# Patient Record
Sex: Male | Born: 1955
Health system: Southern US, Community
[De-identification: ages and names within clinical notes are randomized; demographics above are authoritative.]

## PROBLEM LIST (undated history)

## (undated) DIAGNOSIS — R350 Frequency of micturition: Secondary | ICD-10-CM

## (undated) DIAGNOSIS — M549 Dorsalgia, unspecified: Secondary | ICD-10-CM

## (undated) DIAGNOSIS — S42009A Fracture of unspecified part of unspecified clavicle, initial encounter for closed fracture: Secondary | ICD-10-CM

## (undated) DIAGNOSIS — I2510 Atherosclerotic heart disease of native coronary artery without angina pectoris: Secondary | ICD-10-CM

## (undated) DIAGNOSIS — E782 Mixed hyperlipidemia: Secondary | ICD-10-CM

## (undated) DIAGNOSIS — G8929 Other chronic pain: Secondary | ICD-10-CM

## (undated) DIAGNOSIS — G4730 Sleep apnea, unspecified: Secondary | ICD-10-CM

## (undated) DIAGNOSIS — N529 Male erectile dysfunction, unspecified: Secondary | ICD-10-CM

## (undated) DIAGNOSIS — I10 Essential (primary) hypertension: Secondary | ICD-10-CM

## (undated) DIAGNOSIS — M1990 Unspecified osteoarthritis, unspecified site: Secondary | ICD-10-CM

## (undated) DIAGNOSIS — E538 Deficiency of other specified B group vitamins: Secondary | ICD-10-CM

## (undated) DIAGNOSIS — K219 Gastro-esophageal reflux disease without esophagitis: Secondary | ICD-10-CM

## (undated) DIAGNOSIS — N400 Enlarged prostate without lower urinary tract symptoms: Secondary | ICD-10-CM

## (undated) DIAGNOSIS — I219 Acute myocardial infarction, unspecified: Secondary | ICD-10-CM

## (undated) DIAGNOSIS — I739 Peripheral vascular disease, unspecified: Secondary | ICD-10-CM

## (undated) DIAGNOSIS — I255 Ischemic cardiomyopathy: Secondary | ICD-10-CM

## (undated) DIAGNOSIS — I639 Cerebral infarction, unspecified: Secondary | ICD-10-CM

## (undated) HISTORY — DX: Gastro-esophageal reflux disease without esophagitis: K21.9

## (undated) HISTORY — DX: Benign prostatic hyperplasia without lower urinary tract symptoms: N40.0

## (undated) HISTORY — DX: Deficiency of other specified B group vitamins: E53.8

## (undated) HISTORY — PX: HAND SURGERY: SHX662

## (undated) HISTORY — PX: KNEE ARTHROSCOPY: SUR90

## (undated) HISTORY — DX: Ischemic cardiomyopathy: I25.5

## (undated) HISTORY — DX: Male erectile dysfunction, unspecified: N52.9

## (undated) HISTORY — DX: Atherosclerotic heart disease of native coronary artery without angina pectoris: I25.10

## (undated) HISTORY — DX: Mixed hyperlipidemia: E78.2

## (undated) HISTORY — PX: COLONOSCOPY: SHX174

## (undated) HISTORY — DX: Cerebral infarction, unspecified: I63.9

## (undated) HISTORY — DX: Fracture of unspecified part of unspecified clavicle, initial encounter for closed fracture: S42.009A

## (undated) HISTORY — PX: CARDIAC CATHETERIZATION: SHX172

## (undated) HISTORY — PX: FOOT SURGERY: SHX648

---

## 2001-05-22 ENCOUNTER — Inpatient Hospital Stay (HOSPITAL_COMMUNITY): Admission: EM | Admit: 2001-05-22 | Discharge: 2001-05-26 | Payer: Self-pay | Admitting: Emergency Medicine

## 2001-05-22 HISTORY — PX: CORONARY ANGIOPLASTY WITH STENT PLACEMENT: SHX49

## 2001-06-27 ENCOUNTER — Ambulatory Visit (HOSPITAL_COMMUNITY): Admission: RE | Admit: 2001-06-27 | Discharge: 2001-06-28 | Payer: Self-pay | Admitting: *Deleted

## 2001-08-30 ENCOUNTER — Inpatient Hospital Stay (HOSPITAL_COMMUNITY): Admission: EM | Admit: 2001-08-30 | Discharge: 2001-08-31 | Payer: Self-pay | Admitting: Emergency Medicine

## 2003-07-19 ENCOUNTER — Emergency Department (HOSPITAL_COMMUNITY): Admission: EM | Admit: 2003-07-19 | Discharge: 2003-07-19 | Payer: Self-pay | Admitting: Emergency Medicine

## 2003-08-18 ENCOUNTER — Ambulatory Visit (HOSPITAL_BASED_OUTPATIENT_CLINIC_OR_DEPARTMENT_OTHER): Admission: RE | Admit: 2003-08-18 | Discharge: 2003-08-18 | Payer: Self-pay | Admitting: Pulmonary Disease

## 2003-10-05 ENCOUNTER — Ambulatory Visit (HOSPITAL_BASED_OUTPATIENT_CLINIC_OR_DEPARTMENT_OTHER): Admission: RE | Admit: 2003-10-05 | Discharge: 2003-10-05 | Payer: Self-pay | Admitting: Pulmonary Disease

## 2005-10-25 ENCOUNTER — Ambulatory Visit: Payer: Self-pay | Admitting: Cardiovascular Disease

## 2006-10-25 ENCOUNTER — Ambulatory Visit: Payer: Self-pay | Admitting: Cardiovascular Disease

## 2006-11-11 ENCOUNTER — Ambulatory Visit: Payer: Self-pay | Admitting: Cardiovascular Disease

## 2006-11-11 ENCOUNTER — Inpatient Hospital Stay (HOSPITAL_COMMUNITY): Admission: EM | Admit: 2006-11-11 | Discharge: 2006-11-16 | Payer: Self-pay | Admitting: Emergency Medicine

## 2006-11-11 HISTORY — PX: CORONARY ANGIOPLASTY WITH STENT PLACEMENT: SHX49

## 2006-11-12 ENCOUNTER — Encounter: Payer: Self-pay | Admitting: Cardiology

## 2006-11-15 HISTORY — PX: CORONARY ANGIOPLASTY WITH STENT PLACEMENT: SHX49

## 2006-12-09 ENCOUNTER — Ambulatory Visit: Payer: Self-pay | Admitting: Cardiovascular Disease

## 2006-12-09 LAB — CONVERTED CEMR LAB
BUN: 14 mg/dL (ref 6–23)
Basophils Absolute: 0.1 10*3/uL (ref 0.0–0.1)
Basophils Relative: 1 % (ref 0.0–1.0)
CO2: 28 meq/L (ref 19–32)
Calcium: 9.6 mg/dL (ref 8.4–10.5)
Chloride: 107 meq/L (ref 96–112)
Creatinine, Ser: 0.7 mg/dL (ref 0.4–1.5)
Eosinophils Absolute: 1.1 10*3/uL — ABNORMAL HIGH (ref 0.0–0.6)
Eosinophils Relative: 10.3 % — ABNORMAL HIGH (ref 0.0–5.0)
GFR calc Af Amer: 154 mL/min
GFR calc non Af Amer: 127 mL/min
Glucose, Bld: 92 mg/dL (ref 70–99)
HCT: 43.8 % (ref 39.0–52.0)
Hemoglobin: 15.3 g/dL (ref 13.0–17.0)
INR: 1.1 (ref 0.9–2.0)
Lymphocytes Relative: 30.5 % (ref 12.0–46.0)
MCHC: 34.9 g/dL (ref 30.0–36.0)
MCV: 88.2 fL (ref 78.0–100.0)
Monocytes Absolute: 1.1 10*3/uL — ABNORMAL HIGH (ref 0.2–0.7)
Monocytes Relative: 10.4 % (ref 3.0–11.0)
Neutro Abs: 4.8 10*3/uL (ref 1.4–7.7)
Neutrophils Relative %: 47.8 % (ref 43.0–77.0)
Platelets: 220 10*3/uL (ref 150–400)
Potassium: 3.8 meq/L (ref 3.5–5.1)
Prothrombin Time: 12.9 s (ref 10.0–14.0)
RBC: 4.97 M/uL (ref 4.22–5.81)
RDW: 11.9 % (ref 11.5–14.6)
Sodium: 139 meq/L (ref 135–145)
WBC: 10.2 10*3/uL (ref 4.5–10.5)
aPTT: 36.3 s (ref 26.5–36.5)

## 2006-12-12 ENCOUNTER — Ambulatory Visit: Payer: Self-pay | Admitting: Cardiovascular Disease

## 2006-12-13 ENCOUNTER — Inpatient Hospital Stay (HOSPITAL_BASED_OUTPATIENT_CLINIC_OR_DEPARTMENT_OTHER): Admission: RE | Admit: 2006-12-13 | Discharge: 2006-12-13 | Payer: Self-pay | Admitting: Cardiovascular Disease

## 2006-12-16 ENCOUNTER — Ambulatory Visit (HOSPITAL_COMMUNITY): Admission: RE | Admit: 2006-12-16 | Discharge: 2006-12-16 | Payer: Self-pay | Admitting: Cardiovascular Disease

## 2006-12-19 ENCOUNTER — Ambulatory Visit: Payer: Self-pay | Admitting: Cardiovascular Disease

## 2006-12-25 ENCOUNTER — Ambulatory Visit: Payer: Self-pay | Admitting: Cardiovascular Disease

## 2007-01-17 ENCOUNTER — Ambulatory Visit: Payer: Self-pay | Admitting: Cardiovascular Disease

## 2008-02-09 ENCOUNTER — Ambulatory Visit (HOSPITAL_COMMUNITY): Admission: RE | Admit: 2008-02-09 | Discharge: 2008-02-09 | Payer: Self-pay | Admitting: Orthopedic Surgery

## 2008-05-26 ENCOUNTER — Ambulatory Visit (HOSPITAL_COMMUNITY): Admission: RE | Admit: 2008-05-26 | Discharge: 2008-05-26 | Payer: Self-pay | Admitting: Orthopedic Surgery

## 2008-10-11 ENCOUNTER — Ambulatory Visit: Payer: Self-pay | Admitting: Cardiovascular Disease

## 2008-10-18 ENCOUNTER — Ambulatory Visit: Payer: Self-pay

## 2010-02-16 ENCOUNTER — Ambulatory Visit: Payer: Self-pay | Admitting: Cardiovascular Disease

## 2010-02-16 ENCOUNTER — Encounter (INDEPENDENT_AMBULATORY_CARE_PROVIDER_SITE_OTHER): Payer: Self-pay | Admitting: *Deleted

## 2010-02-16 DIAGNOSIS — E782 Mixed hyperlipidemia: Secondary | ICD-10-CM | POA: Insufficient documentation

## 2010-02-16 DIAGNOSIS — F17200 Nicotine dependence, unspecified, uncomplicated: Secondary | ICD-10-CM | POA: Insufficient documentation

## 2010-02-16 DIAGNOSIS — R079 Chest pain, unspecified: Secondary | ICD-10-CM | POA: Insufficient documentation

## 2010-02-16 DIAGNOSIS — I10 Essential (primary) hypertension: Secondary | ICD-10-CM | POA: Insufficient documentation

## 2010-02-16 DIAGNOSIS — E785 Hyperlipidemia, unspecified: Secondary | ICD-10-CM | POA: Insufficient documentation

## 2010-02-20 ENCOUNTER — Ambulatory Visit: Payer: Self-pay | Admitting: Cardiovascular Disease

## 2010-02-20 ENCOUNTER — Ambulatory Visit (HOSPITAL_COMMUNITY): Admission: RE | Admit: 2010-02-20 | Discharge: 2010-02-20 | Payer: Self-pay | Admitting: Cardiovascular Disease

## 2010-02-21 ENCOUNTER — Telehealth: Payer: Self-pay | Admitting: Cardiovascular Disease

## 2010-02-21 ENCOUNTER — Telehealth (INDEPENDENT_AMBULATORY_CARE_PROVIDER_SITE_OTHER): Payer: Self-pay | Admitting: *Deleted

## 2010-02-22 ENCOUNTER — Encounter: Payer: Self-pay | Admitting: Cardiovascular Disease

## 2010-02-22 ENCOUNTER — Encounter (INDEPENDENT_AMBULATORY_CARE_PROVIDER_SITE_OTHER): Payer: Self-pay | Admitting: *Deleted

## 2010-02-22 ENCOUNTER — Ambulatory Visit: Payer: Self-pay

## 2010-02-22 ENCOUNTER — Ambulatory Visit: Payer: Self-pay | Admitting: Cardiology

## 2010-02-22 ENCOUNTER — Encounter (HOSPITAL_COMMUNITY): Admission: RE | Admit: 2010-02-22 | Discharge: 2010-05-09 | Payer: Self-pay | Admitting: Cardiovascular Disease

## 2010-02-28 ENCOUNTER — Encounter (INDEPENDENT_AMBULATORY_CARE_PROVIDER_SITE_OTHER): Payer: Self-pay | Admitting: *Deleted

## 2010-02-28 ENCOUNTER — Ambulatory Visit: Payer: Self-pay | Admitting: Cardiovascular Disease

## 2010-03-13 ENCOUNTER — Ambulatory Visit: Payer: Self-pay | Admitting: Cardiovascular Disease

## 2010-03-13 DIAGNOSIS — R0602 Shortness of breath: Secondary | ICD-10-CM | POA: Insufficient documentation

## 2010-03-13 DIAGNOSIS — I2510 Atherosclerotic heart disease of native coronary artery without angina pectoris: Secondary | ICD-10-CM | POA: Insufficient documentation

## 2010-03-15 LAB — CONVERTED CEMR LAB
BUN: 16 mg/dL (ref 6–23)
Basophils Absolute: 0.2 10*3/uL — ABNORMAL HIGH (ref 0.0–0.1)
Basophils Relative: 2 % (ref 0.0–3.0)
CO2: 27 meq/L (ref 19–32)
Calcium: 9.1 mg/dL (ref 8.4–10.5)
Chloride: 108 meq/L (ref 96–112)
Creatinine, Ser: 0.6 mg/dL (ref 0.4–1.5)
Eosinophils Absolute: 0.7 10*3/uL (ref 0.0–0.7)
Eosinophils Relative: 7.6 % — ABNORMAL HIGH (ref 0.0–5.0)
GFR calc non Af Amer: 146.38 mL/min (ref >60)
Glucose, Bld: 100 mg/dL — ABNORMAL HIGH (ref 70–99)
HCT: 40.1 % (ref 39.0–52.0)
Hemoglobin: 13.8 g/dL (ref 13.0–17.0)
INR: 1.1 — ABNORMAL HIGH (ref 0.8–1.0)
Lymphocytes Relative: 32.6 % (ref 12.0–46.0)
Lymphs Abs: 3 10*3/uL (ref 0.7–4.0)
MCHC: 34.5 g/dL (ref 30.0–36.0)
MCV: 89.9 fL (ref 78.0–100.0)
Monocytes Absolute: 0.6 10*3/uL (ref 0.1–1.0)
Monocytes Relative: 6.3 % (ref 3.0–12.0)
Neutro Abs: 4.7 10*3/uL (ref 1.4–7.7)
Neutrophils Relative %: 51.5 % (ref 43.0–77.0)
Platelets: 200 10*3/uL (ref 150.0–400.0)
Potassium: 3.7 meq/L (ref 3.5–5.1)
Prothrombin Time: 11.6 s (ref 9.1–11.7)
RBC: 4.46 M/uL (ref 4.22–5.81)
RDW: 12.5 % (ref 11.5–14.6)
Sodium: 141 meq/L (ref 135–145)
WBC: 9.1 10*3/uL (ref 4.5–10.5)

## 2010-03-16 ENCOUNTER — Ambulatory Visit: Payer: Self-pay | Admitting: Cardiovascular Disease

## 2010-03-16 ENCOUNTER — Observation Stay (HOSPITAL_COMMUNITY): Admission: RE | Admit: 2010-03-16 | Discharge: 2010-03-17 | Payer: Self-pay | Admitting: Cardiovascular Disease

## 2010-03-16 HISTORY — PX: CORONARY ANGIOPLASTY WITH STENT PLACEMENT: SHX49

## 2010-04-17 ENCOUNTER — Telehealth: Payer: Self-pay | Admitting: Cardiovascular Disease

## 2010-05-23 ENCOUNTER — Ambulatory Visit: Payer: Self-pay | Admitting: Cardiovascular Disease

## 2010-06-20 ENCOUNTER — Ambulatory Visit: Payer: Self-pay | Admitting: Cardiology

## 2010-06-20 ENCOUNTER — Emergency Department (HOSPITAL_COMMUNITY): Admission: EM | Admit: 2010-06-20 | Discharge: 2010-06-20 | Payer: Self-pay | Admitting: Emergency Medicine

## 2010-06-20 ENCOUNTER — Encounter: Payer: Self-pay | Admitting: Cardiovascular Disease

## 2010-06-21 ENCOUNTER — Telehealth: Payer: Self-pay | Admitting: Cardiovascular Disease

## 2010-06-22 ENCOUNTER — Telehealth: Payer: Self-pay | Admitting: Cardiovascular Disease

## 2010-06-24 ENCOUNTER — Emergency Department (HOSPITAL_COMMUNITY): Admission: EM | Admit: 2010-06-24 | Discharge: 2010-06-24 | Payer: Self-pay | Admitting: Emergency Medicine

## 2010-06-26 ENCOUNTER — Ambulatory Visit: Payer: Self-pay | Admitting: Cardiovascular Disease

## 2010-06-26 ENCOUNTER — Encounter: Payer: Self-pay | Admitting: Cardiology

## 2010-06-26 DIAGNOSIS — S42009A Fracture of unspecified part of unspecified clavicle, initial encounter for closed fracture: Secondary | ICD-10-CM | POA: Insufficient documentation

## 2010-06-27 ENCOUNTER — Ambulatory Visit (HOSPITAL_COMMUNITY): Admission: RE | Admit: 2010-06-27 | Discharge: 2010-06-27 | Payer: Self-pay | Admitting: Cardiovascular Disease

## 2010-07-12 ENCOUNTER — Ambulatory Visit: Payer: Self-pay | Admitting: Gastroenterology

## 2010-07-12 DIAGNOSIS — I2510 Atherosclerotic heart disease of native coronary artery without angina pectoris: Secondary | ICD-10-CM | POA: Insufficient documentation

## 2010-07-12 DIAGNOSIS — K921 Melena: Secondary | ICD-10-CM | POA: Insufficient documentation

## 2010-09-04 ENCOUNTER — Telehealth: Payer: Self-pay | Admitting: Gastroenterology

## 2010-09-15 ENCOUNTER — Ambulatory Visit: Payer: Self-pay | Admitting: Cardiovascular Disease

## 2010-10-16 ENCOUNTER — Ambulatory Visit: Payer: Self-pay | Admitting: Gastroenterology

## 2010-12-03 LAB — CONVERTED CEMR LAB
BUN: 12 mg/dL (ref 6–23)
Basophils Absolute: 0.2 10*3/uL — ABNORMAL HIGH (ref 0.0–0.1)
Basophils Relative: 2.1 % (ref 0.0–3.0)
CO2: 26 meq/L (ref 19–32)
Calcium: 9 mg/dL (ref 8.4–10.5)
Chloride: 105 meq/L (ref 96–112)
Creatinine, Ser: 0.7 mg/dL (ref 0.4–1.5)
Eosinophils Absolute: 0.9 10*3/uL — ABNORMAL HIGH (ref 0.0–0.7)
Eosinophils Relative: 8.8 % — ABNORMAL HIGH (ref 0.0–5.0)
GFR calc non Af Amer: 124.92 mL/min (ref >60)
Glucose, Bld: 93 mg/dL (ref 70–99)
HCT: 42.4 % (ref 39.0–52.0)
Hemoglobin: 14.7 g/dL (ref 13.0–17.0)
INR: 1.1 — ABNORMAL HIGH (ref 0.8–1.0)
Lymphocytes Relative: 32.5 % (ref 12.0–46.0)
Lymphs Abs: 3.2 10*3/uL (ref 0.7–4.0)
MCHC: 34.7 g/dL (ref 30.0–36.0)
MCV: 89 fL (ref 78.0–100.0)
Monocytes Absolute: 0.8 10*3/uL (ref 0.1–1.0)
Monocytes Relative: 8.7 % (ref 3.0–12.0)
Neutro Abs: 4.7 10*3/uL (ref 1.4–7.7)
Neutrophils Relative %: 47.9 % (ref 43.0–77.0)
Platelets: 177 10*3/uL (ref 150.0–400.0)
Potassium: 3.8 meq/L (ref 3.5–5.1)
Prothrombin Time: 11.6 s (ref 9.1–11.7)
RBC: 4.76 M/uL (ref 4.22–5.81)
RDW: 12.9 % (ref 11.5–14.6)
Sodium: 138 meq/L (ref 135–145)
WBC: 9.7 10*3/uL (ref 4.5–10.5)

## 2010-12-05 NOTE — Progress Notes (Signed)
Summary: Nuclear Pre-Procedure  Phone Note Outgoing Call Call back at Work Phone [phone]   Call placed by: Stanton Kidney, EMT-P,  February 21, 2010 1:56 PM Action Taken: Phone Call Completed Summary of Call: Reviewed information on [employer] Information Sheet (see scanned document for further details).  Spoke with Patient.    Nuclear Med Background Indications for Stress Test: Evaluation for Ischemia, Stent Patency   History: Abnormal EKG, Echo, Heart Catheterization, Myocardial Infarction, Myocardial Perfusion Study, Stents  History Comments: '02 IWMI w/ V. Fib > RCA Stent '08 AWMI > Stents: LAD, RCA '08 Echo: EF= 45% '09 MPS: EF=43%   Symptoms: Chest Pain with Exertion    Nuclear Pre-Procedure Cardiac Risk Factors: Hypertension, Lipids, Smoker Height (in): 68

## 2010-12-05 NOTE — Assessment & Plan Note (Signed)
Summary: f2y/ gd  Medications Added CARVEDILOL 12.5 MG TABS (CARVEDILOL) Take one tablet by mouth twice a day      Allergies Added: NKDA  CC:  sob.  History of Present Illness: [name] returns today for followup.  He has had multiple previous interventions.  He had drug-eluting stents placed to his RCA and LAD back in 2002.  In 2008, he had an anterior wall MI and subsequently had bare-metal stents to the LAD and RCA.  Unfortunately, continues to smoke.  I counseled him for less than 10 minutes today regarding this,he hopes to quit this month again.  He just had a new grandson and it has him thinking again.  [name]  He does not need any Chantix or Wellbutrin.  He has quit [place] in the past. He has been having exertiona "uneasyness" that sounds anginal. It has been building over the last 6 months and is now fairly reproducable.  He has not had any rest pain.  There is no associated diaphoresis or dyspnea.  Given his previous history and abnormal ECG in the office today I told him I would like him to have a heart cath.  We will arrange this for Monday with Dr Excell Seltzer from the right radial approach since I am not in the lab next week.  If he has restenosis he may be a better candidate for Effient long term.  Current Problems (verified): 1)  Chest Pain  (ICD-786.50)  Current Medications (verified): 1)  Carvedilol 12.5 Mg Tabs (Carvedilol) .... Take One Tablet By Mouth Twice A Day 2)  Pravachol 80 Mg Tabs (Pravastatin Sodium) .[name].. 1 Tab By Mouth Once Daily 3)  Plavix 75 Mg Tabs (Clopidogrel Bisulfate) .... Take One Tablet By Mouth Daily 4)  Altace 5 Mg Caps (Ramipril) .[name].. 1 Tab By Mouth Once Daily  Allergies (verified): No Known Drug Allergies  Review of Systems       Denies fever, malais, weight loss, blurry vision, decreased visual acuity, cough, sputum, SOB, hemoptysis, pleuritic pain, palpitaitons, heartburn, abdominal pain, melena, lower extremity edema, claudication, or rash. All other  systems reviewed and negative except as noted in HPI  Vital Signs:  Patient profile:   55 year old male Height:      68 inches Weight:      168 pounds BMI:     25.64 Pulse rate:   68 / minute Resp:     14 per minute BP sitting:   142 / 80  (left arm)  Vitals Entered By: [name] (February 16, 2010 4:08 PM)  Physical Exam  General:  Affect appropriate Healthy:  appears stated age HEENT: normal Neck supple with no adenopathy JVP normal no bruits no thyromegaly Lungs clear with no wheezing and good diaphragmatic motion Heart:  S1/S2 no murmur,rub, gallop or click PMI normal Abdomen: benighn, BS positve, no tenderness, no AAA no bruit.  No HSM or HJR Distal pulses intact with no bruits No edema Neuro non-focal Skin warm and dry    Impression & Recommendations:  Problem # 1:  CHEST PAIN (ICD-786.50) History of multiple interventions to LAD and RCA with continued smoking Chest pressure conscerning for angina with ECG changes Cath inpatient lab with [facility] Monday  Right Radial His updated medication list for this problem includes:    Carvedilol 12.5 Mg Tabs (Carvedilol) .[name]... Take one tablet by mouth twice a day    Plavix 75 Mg Tabs (Clopidogrel bisulfate) .[name]... Take one tablet by mouth daily    Altace  5 Mg Caps (Ramipril) .[name]... 1 tab by mouth once daily  Orders: TLB-BMP (Basic Metabolic Panel-BMET) (80048-METABOL) TLB-CBC Platelet - w/Differential (85025-CBCD) TLB-PT (Protime) (85610-PTP) T-2 View CXR (71020TC) Cardiac Catheterization (Cardiac Cath)  Problem # 2:  ESSENTIAL HYPERTENSION, BENIGN (ICD-401.1) Well controlled His updated medication list for this problem includes:    Carvedilol 12.5 Mg Tabs (Carvedilol) .[name]... Take one tablet by mouth twice a day    Altace 5 Mg Caps (Ramipril) .[name]... 1 tab by mouth once daily  Problem # 3:  MIXED HYPERLIPIDEMIA (ICD-272.2) Will switch to high dose Crestor post cath to decrease risk of LFT abnormalities and improve  LDL His updated medication list for this problem includes:    Pravachol 80 Mg Tabs (Pravastatin sodium) .[name]... 1 tab by mouth once daily  Problem # 4:  SMOKER (ICD-305.1) I think he has personal reasons to quit again.  Post cath will offer medication choice to help.  Not a candidate for nicotine replacement at this time  Patient Instructions: 1)  Your physician recommends that you schedule a follow-up appointment in: 4 weeks 2)  Your physician has requested that you have a cardiac catheterization.  Cardiac catheterization is used to diagnose and/or treat various heart conditions. Doctors may recommend this procedure for a number of different reasons. The most common reason is to evaluate chest pain. Chest pain can be a symptom of coronary artery disease (CAD), and cardiac catheterization can show whether plaque is narrowing or blocking your heart's arteries. This procedure is also used to evaluate the valves, as well as measure the blood flow and oxygen levels in different parts of your heart.  For further information please visit [email or web address]  Please follow instruction sheet, as given.   Echocardiogram Report  Procedure date:  02/16/2010  Findings:      NSR IMI Anteroseptal infarct T wave changes ? lateral ishcemia

## 2010-12-05 NOTE — Letter (Signed)
Summary: [facility] Instructions  [facility]  [address] [place], [place] [address]   Phone: [phone]  Fax: [phone]       [name]    10/29/1956    MRN: [id number]        Procedure Day /Date: Friday November 4th, 2011     Arrival Time: 3:00pm     Procedure Time: 4:00pm     Location of Procedure:                    _ x_   [facility] (4th Floor)                        PREPARATION FOR COLONOSCOPY WITH MOVIPREP   Starting 5 days prior to your procedure 09/04/10 do not eat nuts, seeds, popcorn, corn, beans, peas,  salads, or any raw vegetables.  Do not take any fiber supplements (e.g. Metamucil, Citrucel, and Benefiber).  THE DAY BEFORE YOUR PROCEDURE         DATE: 09/07/10  DAY: Thursday  1.  Drink clear liquids the entire day-NO SOLID FOOD  2.  Do not drink anything colored red or purple.  Avoid juices with pulp.  No orange juice.  3.  Drink at least 64 oz. (8 glasses) of fluid/clear liquids during the day to prevent dehydration and help the prep work efficiently.  CLEAR LIQUIDS INCLUDE: Water Jello Ice Popsicles Tea (sugar ok, no milk/cream) Powdered fruit flavored drinks Coffee (sugar ok, no milk/cream) Gatorade Juice: apple, white grape, white cranberry  Lemonade Clear bullion, consomm, broth Carbonated beverages (any kind) Strained chicken noodle soup Hard Candy                             4.  In the morning, mix first dose of MoviPrep solution:    Empty 1 Pouch A and 1 Pouch B into the disposable container    Add lukewarm drinking water to the top line of the container. Mix to dissolve    Refrigerate (mixed solution should be used within 24 hrs)  5.  Begin drinking the prep at 5:00 p.m. The MoviPrep container is divided by 4 marks.   Every 15 minutes drink the solution down to the next mark (approximately 8 oz) until the full liter is complete.   6.  Follow completed prep with 16 oz of clear liquid of your  choice (Nothing red or purple).  Continue to drink clear liquids until bedtime.  7.  Before going to bed, mix second dose of MoviPrep solution:    Empty 1 Pouch A and 1 Pouch B into the disposable container    Add lukewarm drinking water to the top line of the container. Mix to dissolve    Refrigerate  THE DAY OF YOUR PROCEDURE      DATE: 09/08/10 DAY: Friday  Beginning at 11:00 a.m. (5 hours before procedure):         1. Every 15 minutes, drink the solution down to the next mark (approx 8 oz) until the full liter is complete.  2. Follow completed prep with 16 oz. of clear liquid of your choice.    3. You may drink clear liquids until 2:00pm (2 HOURS BEFORE PROCEDURE).   MEDICATION INSTRUCTIONS  Unless otherwise instructed, you should take regular prescription medications with a small sip of water   as early as possible the morning of your  procedure.  Stop taking Plavix or Aggrenox on  _  _  (7 days before procedure).     Additional medication instructions: You will be contacted by our office prior to your procedure for directions on holding your Plavix.  If you do not hear from our office 1 week prior to your scheduled procedure, please call [phone] to discuss.         OTHER INSTRUCTIONS  You will need a responsible adult at least 54 years of age to accompany you and drive you home.   This person must remain in the waiting room during your procedure.  Wear loose fitting clothing that is easily removed.  Leave jewelry and other valuables at home.  However, you may wish to bring a book to read or  an iPod/MP3 player to listen to music as you wait for your procedure to start.  Remove all body piercing jewelry and leave at home.  Total time from sign-in until discharge is approximately 2-3 hours.  You should go home directly after your procedure and rest.  You can resume normal activities the  day after your procedure.  The day of your procedure you should not:    Drive   Make legal decisions   Operate machinery   Drink alcohol   Return to work  You will receive specific instructions about eating, activities and medications before you leave.    The above instructions have been reviewed and explained to me by   _______________________    I fully understand and can verbalize these instructions _____________________________ Date _________

## 2010-12-05 NOTE — Letter (Signed)
Summary: Return To Work  [facility], Main Office  [address]   [place], [place] [address]   Phone: [phone]  Fax: [phone]    06/26/2010  TO: WHOM IT MAY CONCERN   RE: [name] [address] [place]   The above named individual is under my medical care and may return to work on:June 27, 2010 No Restriction  If you have any further questions or need additional information, please call.     Sincerely,   Dr. Charlton Haws Lisabeth Devoid RN

## 2010-12-05 NOTE — Miscellaneous (Signed)
  Clinical Lists Changes  Medications: Added new medication of CARVEDILOL 12.5 MG TABS (CARVEDILOL) Take one tablet by mouth twice a day Added new medication of PRAVACHOL 80 MG TABS (PRAVASTATIN SODIUM) 1 tab by mouth once daily Added new medication of PLAVIX 75 MG TABS (CLOPIDOGREL BISULFATE) Take one tablet by mouth daily Added new medication of ALTACE 5 MG CAPS (RAMIPRIL) 1 tab by mouth once daily

## 2010-12-05 NOTE — Assessment & Plan Note (Signed)
Summary: SCREEN FOR COLON ON PLAVIX/YF   History of Present Illness Visit Type: consult Primary GI MD: Elie Goody MD [facility] Primary Provider: Paulene Floor, FNP Requesting Provider: Charlton Haws, MD Chief Complaint: BRB in stool after BMs and when patient wipes after BMs  History of Present Illness:   This is a 55 year old male, who relates intermittent, small amounts of bright red blood per rectum with bowel movements for the past year.  He has underlying coronary artery disease and is status post multiple stent placements, most recently he had a drug-eluting stent placed in May 2011. He is maintained on Plavix and ASA.  He recently suffered a right clavicle fracture and is recovering with limited mobility.   GI Review of Systems      Denies abdominal pain, acid reflux, belching, bloating, chest pain, dysphagia with liquids, dysphagia with solids, heartburn, loss of appetite, nausea, vomiting, vomiting blood, weight loss, and  weight gain.      Reports rectal bleeding.     Denies anal fissure, black tarry stools, change in bowel habit, constipation, diarrhea, diverticulosis, fecal incontinence, heme positive stool, hemorrhoids, irritable bowel syndrome, jaundice, light color stool, liver problems, and  rectal pain.   Current Medications (verified): 1)  Carvedilol 12.5 Mg Tabs (Carvedilol) .... Take One Tablet By Mouth Twice A Day 2)  Pravachol 80 Mg Tabs (Pravastatin Sodium) .[name].. 1 Tab By Mouth Once Daily 3)  Plavix 75 Mg Tabs (Clopidogrel Bisulfate) .... Take One Tablet By Mouth Daily 4)  Altace 5 Mg Caps (Ramipril) .[name].. 1 Tab By Mouth Once Daily 5)  Aspirin Ec 325 Mg Tbec (Aspirin) .... Take One Tablet By Mouth Daily 6)  Calcium 1000 + D 1000-800 Mg-Unit Tabs (Calcium Carb-Cholecalciferol) .... One Tablet By Mouth Once Daily 7)  Rolaids 550-110 Mg Chew (Ca Carbonate-Mag Hydroxide) .... As Needed For Heartburn  Allergies (verified): No Known Drug Allergies  Past  History:  Past Medical History: IMI: 05/22/2001 AMI:  11/23/2007 Smoking DYSPNEA (ICD-786.05) CORONARY ATHEROSCLEROSIS NATIVE CORONARY ARTERY (ICD-414.01) MIXED HYPERLIPIDEMIA (ICD-272.2) ESSENTIAL HYPERTENSION, BENIGN (ICD-401.1) CHEST PAIN (ICD-786.50) GERD  Past Surgical History: Reviewed history from 07/11/2010 and no changes required. 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         The pertinent positives and negatives are noted as above and in the HPI. All other ROS were reviewed and were negative.   Vital Signs:  Patient profile:   55 year old male Height:      68 inches Weight:      160 pounds BMI:     24.42 BSA:     1.86 Pulse rate:   80 / minute Pulse rhythm:   regular BP sitting:   124 / 76  (left arm) Cuff size:   regular  Vitals Entered By: Ok Anis CMA (July 12, 2010 3:05 PM)  Physical Exam  General:  Well developed, well nourished, no acute distress. Head:  Normocephalic and atraumatic. Eyes:  PERRLA, no icterus. Ears:  Normal auditory acuity. Mouth:  No  deformity or lesions, dentition normal. Neck:  Supple; no masses or thyromegaly. Lungs:  Clear throughout to auscultation. Heart:  Regular rate and rhythm; no murmurs, rubs,  or bruits. Abdomen:  Soft, nontender and nondistended. No masses, hepatosplenomegaly or hernias noted. Normal bowel sounds. Rectal:  hemoccult positive brown stool, no lesions. Msk:  Symmetrical with no gross deformities. Normal posture. Pulses:  Normal pulses  noted. Extremities:  No clubbing, cyanosis, edema or deformities noted. Neurologic:  Alert and  oriented x4;  grossly normal neurologically. Cervical Nodes:  No significant cervical adenopathy. Inguinal Nodes:  No significant inguinal adenopathy. Psych:  Alert and cooperative. Normal mood and affect.   Impression & Recommendations:  Problem # 1:  BLOOD IN STOOL (ICD-578.1) Intermittent small-volume hematochezia and Hemoccult-positive stool. Rule out hemorrhoids, colorectal neoplasms, inflammatory bowel disease, AVMs, and other disorders. The risks, benefits and alternatives to colonoscopy with possible biopsy, possible destruction of hemorrhoids and possible polypectomy were discussed with the patient and they consent to proceed. The procedure will be scheduled electively. Orders: Colonoscopy (Colon)  Problem # 2:  CORONARY ATHEROSCLEROSIS NATIVE CORONARY ARTERY (ICD-414.01) He has underlying coronary artery disease with prior cornary artery stents and a drug-eluting stent placed in May. Plan to proceed with colonoscopy while on Plavix and aspirin. He understands that he will be at increased risk of bleeding with biopsies, removal of polyps, treatment of hemorrhoids or other therapeutic interventions. If a larger polyp is noted at colonoscopy we may defer removal. Contact Dr. [name] regarding a 5 day hold of Plavix, however, I feel it is unlikely the pt will be cleared to come off Plavix for 5 days with his recently placed drug-eluting stent.  Patient Instructions: 1)  Colonoscopy brochure given.  2)  Pick up your prep from your pharmacy.  3)  Copy sent to : Paulene Floor, FNP 4)                         Charlton Haws, MD 5)  The medication list was reviewed and reconciled.  All changed / newly prescribed medications were explained.  A complete medication list was provided to the patient / caregiver.  Prescriptions: MOVIPREP 100 GM  SOLR (PEG-KCL-NACL-NASULF-NA ASC-C) As per prep instructions.   #1 x 0   Entered by:   Christie Nottingham CMA (AAMA)   Authorized by:   Meryl Dare MD [facility]   Signed by:   Meryl Dare MD [facility] on 07/12/2010   Method used:   Electronically to        [facility] [phone]* (retail)       [address]       [place], [place]  [address]       Ph: [phone] or [phone]       Fax: [phone]   RxID:   [id number]

## 2010-12-05 NOTE — Cardiovascular Report (Signed)
Summary: Pre Cath/Percutaneous Orders  Pre Cath/Percutaneous Orders   Imported By: Roderic Ovens 03/08/2010 15:16:44  _____________________________________________________________________  External Attachment:    Type:   Image     Comment:   External Document

## 2010-12-05 NOTE — Letter (Signed)
Summary: Anticoagulation Modification Letter  [facility]  [address] [place], [place] [address]   Phone: [phone]  Fax: [phone]    July 12, 2010  Re:    [name] DOB:    15-May-1956 MRN:    [id number]    Dear Dr. [name],  We have scheduled the above patient for an endoscopic procedure. Our records show that  he/she is on anticoagulation therapy. Please advise as to how long the patient may come off their therapy of Plavix prior to the scheduled procedure(s) on 09/08/10. We are aware the patient has had a stent placed recently and we are not sure if they can even come off this medication prior to the scheduled procedure.   Please fax back/or route the completed form to [facility] at [phone].  Thank you for your help with this matter.  Sincerely,  Christie Nottingham CMA Duncan Dull)   Physician Recommendation:  Hold Plavix 7 days prior ________________  Hold Coumadin 5 days prior ____________  Other ______________________________     Appended Document: Anticoagulation Modification Letter Recent stent. Should not stop Plavix until november unless active bleeding or suspicion of CA  Appended Document: Anticoagulation Modification Letter Pt notified to stay on Plavix for his procedure.

## 2010-12-05 NOTE — Assessment & Plan Note (Signed)
Summary: PER CHECK OUT/SF      Allergies Added: NKDA  Visit Type:  Follow-up Referring Provider:  Charlton Haws, MD Primary Provider:  Paulene Floor, FNP   History of Present Illness: F/U CAD with chronically occluded RCA and recent stenting of LAD by Dr Excell Seltzer at end of May.  CRF well modified.  Compliant with Plavix. P2Y testing showed 72% inhibition. and PRU only 76 which is excellent inhibition.   Has been off cigarettes since about a week before procedure.  Counseled for less than 10 minutes.  Difficult due to family and co-workers that smoke.  Discussed nicotine replacement if needed.  Also told him to call if he goes back to smoking so we could consider welbutrin and change him to Effient.  Not having any angina.   Still has some BRBPR colonoscopy rescheduled.  Since he is asymptomatic and will be past the 6 months mark with good PLT inhibition I think it should be ok to proceed with colonoscopy 12/12 and hold Plavix for 5 days .  Current Problems (verified): 1)  Blood in Stool  (ICD-578.1) 2)  Cad  (ICD-414.00) 3)  Fracture, Clavicle, Right  (ICD-810.00) 4)  Dyspnea  (ICD-786.05) 5)  Coronary Atherosclerosis Native Coronary Artery  (ICD-414.01) 6)  Mixed Hyperlipidemia  (ICD-272.2) 7)  Smoker  (ICD-305.1) 8)  Essential Hypertension, Benign  (ICD-401.1) 9)  Chest Pain  (ICD-786.50)  Current Medications (verified): 1)  Carvedilol 12.5 Mg Tabs (Carvedilol) .... Take One Tablet By Mouth Twice A Day 2)  Pravachol 80 Mg Tabs (Pravastatin Sodium) .[name].. 1 Tab By Mouth Once Daily 3)  Plavix 75 Mg Tabs (Clopidogrel Bisulfate) .... Take One Tablet By Mouth Daily 4)  Altace 5 Mg Caps (Ramipril) .[name].. 1 Tab By Mouth Once Daily 5)  Aspirin Ec 325 Mg Tbec (Aspirin) .... Take One Tablet By Mouth Daily  Allergies (verified): No Known Drug Allergies  Past History:  Past Medical History: Last updated: 07/12/2010 IMI: 05/22/2001 AMI:  11/23/2007 Smoking DYSPNEA (ICD-786.05) CORONARY  ATHEROSCLEROSIS NATIVE CORONARY ARTERY (ICD-414.01) MIXED HYPERLIPIDEMIA (ICD-272.2) ESSENTIAL HYPERTENSION, BENIGN (ICD-401.1) CHEST PAIN (ICD-786.50) GERD  Past Surgical History: Last updated: 07/11/2010 05/22/2001:  PCI RCA and LAD 11/11/2006:  PCI BMS LAD 11/15/2006  PCI BMS RCA Knee surgery  Family History: Last updated: 07/12/2010 non-contributory Family History of Heart Disease: Father, MGM, MGF, PGM, and PGF No FH of Colon Cancer:  Social History: Last updated: 07/12/2010 Occupation: [employer]  Married Two children 1 gransond Use to Engineer, 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   Vital Signs:  Patient profile:   55 year old male Height:      68 inches Weight:      166 pounds BMI:     25.33 Pulse rate:   72 / minute BP sitting:   134 / 80  (left arm)  Vitals Entered By: Laurance Flatten CMA (September 15, 2010 2:48 PM)  Physical Exam  General:  Affect appropriate Healthy:  appears stated age HEENT: normal Neck supple with no adenopathy JVP normal no bruits no thyromegaly Lungs clear with no wheezing and good diaphragmatic motion Heart:  S1/S2 no murmur,rub, gallop or click PMI normal Abdomen: benighn, BS positve, no tenderness, no  AAA no bruit.  No HSM or HJR Distal pulses intact with no bruits No edema Neuro non-focal Skin warm and dry    Impression & Recommendations:  Problem # 1:  CAD (ICD-414.00) Stable no angina.  Continue ASA/Plavix and BB.  Since he is asymtomatic and will be out past 6 months will let him have colonoscopy in December and hold Plavix for 5 days His updated medication list for this problem includes:    Carvedilol  12.5 Mg Tabs (Carvedilol) .[name]... Take one tablet by mouth twice a day    Plavix 75 Mg Tabs (Clopidogrel bisulfate) .[name]... Take one tablet by mouth daily    Altace 5 Mg Caps (Ramipril) .[name]... 1 tab by mouth once daily    Aspirin Ec 325 Mg Tbec (Aspirin) .[name]... Take one tablet by mouth daily  Problem # 2:  MIXED HYPERLIPIDEMIA (ICD-272.2) Continue statin.  Labs per primary His updated medication list for this problem includes:    Pravachol 80 Mg Tabs (Pravastatin sodium) .[name]... 1 tab by mouth once daily  Problem # 3:  ESSENTIAL HYPERTENSION, BENIGN (ICD-401.1) Continue ACE and low sodium diet His updated medication list for this problem includes:    Carvedilol 12.5 Mg Tabs (Carvedilol) .[name]... Take one tablet by mouth twice a day    Altace 5 Mg Caps (Ramipril) .[name]... 1 tab by mouth once daily    Aspirin Ec 325 Mg Tbec (Aspirin) .[name]... Take one tablet by mouth daily  Patient Instructions: 1)  Your physician recommends that you schedule a follow-up appointment in: 6  MONTHS WITH DR [name] 2)  Your physician recommends that you continue on your current medications as directed. Please refer to the Current Medication list given to you today.

## 2010-12-05 NOTE — Cardiovascular Report (Signed)
Summary: Pre Cath/Percutaneous Orders  Pre Cath/Percutaneous Orders   Imported By: Roderic Ovens 02/22/2010 10:37:16  _____________________________________________________________________  External Attachment:    Type:   Image     Comment:   External Document

## 2010-12-05 NOTE — Assessment & Plan Note (Signed)
Summary: ROV/DISCUSS CATH/DM   CC:  Review Myoview Stress Test; ROV.  History of Present Illness: [name] is seen in f/u for his CAD.  I reviewed his myovue and cath films with him.  I am very concerned about his proximal LAD lesion.  His ETT looked better than expected with an apicla and inferobasa infarct but no quantitative ischemia.  He continues to have angina however with an "uneasy" feeling in his chest with exertion or stress.  Given the severity of his proximal LAD lesion I think it is best to refer him for PCI with Dr Excell Seltzer 03/16/2010.  He is in agreement.  He will have lab work on 5/9 or earlier that week.  He will continue his ASA and Plavix.  He knows to go to the ER with any prolonged SSCP.  He is working on smoking cessation and has cut his consumption in half  Current Problems (verified): 1)  Mixed Hyperlipidemia  (ICD-272.2) 2)  Smoker  (ICD-305.1) 3)  Essential Hypertension, Benign  (ICD-401.1) 4)  Chest Pain  (ICD-786.50)  Current Medications (verified): 1)  Carvedilol 12.5 Mg Tabs (Carvedilol) .... Take One Tablet By Mouth Twice A Day 2)  Pravachol 80 Mg Tabs (Pravastatin Sodium) .[name].. 1 Tab By Mouth Once Daily 3)  Plavix 75 Mg Tabs (Clopidogrel Bisulfate) .... Take One Tablet By Mouth Daily 4)  Altace 5 Mg Caps (Ramipril) .[name].. 1 Tab By Mouth Once Daily  Allergies (verified): No Known Drug Allergies  Past History:  Past Medical History: Last updated: 10/11/2008 IMI: 05/22/2001 AMI:  11/23/2007 Hypercholesterolemia Smoking  Past Surgical History: Last updated: 10/11/2008 05/22/2001:  PCI RCA and LAD 11/11/2006:  PCI BMS LAD 11/15/2006  PCI BMS RCA  Family History: Last updated: 10/11/2008 non-contributory  Social History: Last updated: 10/11/2008 Married Two children 1 granson Use to Engineer, maintenance (nurse Lauren previous player) Still smoking 1 ppd  Review of Systems       Denies fever, malais, weight loss, blurry vision, decreased visual acuity, cough,  sputum, SOB, hemoptysis, pleuritic pain, palpitaitons, heartburn, abdominal pain, melena, lower extremity edema, claudication, or rash.   Vital Signs:  Patient profile:   55 year old male Height:      68 inches Weight:      166 pounds Pulse rate:   72 / minute Pulse rhythm:   regular BP sitting:   148 / 82  (left arm) Cuff size:   regular  Vitals Entered By: Stanton Kidney, EMT-P (February 28, 2010 10:28 AM)  Physical Exam  General:  Affect appropriate Healthy:  appears stated age HEENT: normal Neck supple with no adenopathy JVP normal no bruits no thyromegaly Lungs clear with no wheezing and good diaphragmatic motion Heart:  S1/S2 no murmur,rub, gallop or click PMI normal Abdomen: benighn, BS positve, no tenderness, no AAA no bruit.  No HSM or HJR Distal pulses intact with no bruits No edema Neuro non-focal Skin warm and dry    Impression & Recommendations:  Problem # 1:  MIXED HYPERLIPIDEMIA (ICD-272.2) Continue high dose statin His updated medication list for this problem includes:    Pravachol 80 Mg Tabs (Pravastatin sodium) .[name]... 1 tab by mouth once daily  Problem # 2:  CHEST PAIN (ICD-786.50) CAD with previous MI and stents to LAD and RCA.  70-80% proximal LAD lesion.  PCI with Excell Seltzer 5/12 His updated medication list for this problem includes:    Carvedilol 12.5 Mg Tabs (Carvedilol) .[name]... Take one tablet by mouth twice a day  Plavix 75 Mg Tabs (Clopidogrel bisulfate) .[name]... Take one tablet by mouth daily    Altace 5 Mg Caps (Ramipril) .[name]... 1 tab by mouth once daily  Orders: Cardiac Catheterization (Cardiac Cath)  Patient Instructions: 1)  Your physician recommends that you schedule a follow-up appointment in: 6 WEEKS 2)  Your physician has requested that you have a cardiac catheterization.  Cardiac catheterization is used to diagnose and/or treat various heart conditions. Doctors may recommend this procedure for a number of different reasons. The most common  reason is to evaluate chest pain. Chest pain can be a symptom of coronary artery disease (CAD), and cardiac catheterization can show whether plaque is narrowing or blocking your heart's arteries. This procedure is also used to evaluate the valves, as well as measure the blood flow and oxygen levels in different parts of your heart.  For further information please visit [email or web address]  Please follow instruction sheet, as given.

## 2010-12-05 NOTE — Letter (Signed)
Summary: [facility] - Outpatient Coins[facility] Notice  [facility]   Imported By: Marylou Mccoy 02/23/2010 11:55:44  _____________________________________________________________________  External Attachment:    Type:   Image     Comment:   External Document

## 2010-12-05 NOTE — Progress Notes (Signed)
Summary: previsit letter ?'s   Phone Note Call from Patient Call back at Home Phone [phone]   Caller: Patient Call For: Dr. Russella Dar Reason for Call: Talk to Nurse Summary of Call: previsit letter ?'s Initial call taken by: Vallarie Mare,  September 04, 2010 9:16 AM  Follow-up for Phone Call        Pt wanted to reschedule his procedure for 10/10/10. Pt rescheduled and verbalized understanding of scheduling times.  Follow-up by: Christie Nottingham CMA Duncan Dull),  September 04, 2010 11:44 AM

## 2010-12-05 NOTE — Letter (Signed)
Summary: Return To Work  [facility], Main Office  [address]   [place], [place] [address]   Phone: [phone]  Fax: [phone]    02/22/2010  TO: WHOM IT MAY CONCERN   RE: [name] [address] [place]   The above named individual is under my medical care and may return to work [id number] 02-27-10  If you have any further questions or need additional information, please call.     Sincerely,    [name], RN

## 2010-12-05 NOTE — Progress Notes (Signed)
Summary: refill*[facility]* Please call pt once sent   Phone Note Refill Request Call back at Work Phone [phone]   Refills Requested: Medication #1:  CARVEDILOL 12.5 MG TABS Take one tablet by mouth twice a day   Supply Requested: 3 months  Medication #2:  PLAVIX 75 MG TABS Take one tablet by mouth daily   Supply Requested: 3 months  Medication #3:  PRAVACHOL 80 MG TABS 1 tab by mouth once daily   Supply Requested: 3 months  Medication #4:  ALTACE 5 MG CAPS 1 tab by mouth once daily.   Supply Requested: 3 months [facility] [phone]   Method Requested: Fax to Mail Away Pharmacy Initial call taken by: Migdalia Dk,  April 17, 2010 8:11 AM  Follow-up for Phone Call        Rx faxed to pharmacy Follow-up by: Vikki Ports,  April 17, 2010 12:24 PM    Prescriptions: ALTACE 5 MG CAPS (RAMIPRIL) 1 tab by mouth once daily  #90 x 3   Entered by:   Vikki Ports   Authorized by:   Norva Karvonen, MD   Signed by:   Vikki Ports on 04/17/2010   Method used:   Faxed to ...       [facility] (mail-order)             , [place]         Ph:        Fax: [phone]   RxID:   [id number] PLAVIX 75 MG TABS (CLOPIDOGREL BISULFATE) Take one tablet by mouth daily  #90 x 3   Entered by:   Vikki Ports   Authorized by:   Norva Karvonen, MD   Signed by:   Vikki Ports on 04/17/2010   Method used:   Faxed to ...       [facility] (mail-order)             , [place]         Ph:        Fax: [phone]   RxID:   [id number] PRAVACHOL 80 MG TABS (PRAVASTATIN SODIUM) 1 tab by mouth once daily  #90 x 3   Entered by:   Vikki Ports   Authorized by:   Norva Karvonen, MD   Signed by:   Vikki Ports on 04/17/2010   Method used:   Faxed to ...       [facility] (mail-order)             , [place]         Ph:        Fax: [phone]   RxID:   [id number] CARVEDILOL 12.5 MG TABS (CARVEDILOL) Take one tablet by mouth twice a day  #180 x 3   Entered by:   Vikki Ports   Authorized by:   Norva Karvonen, MD   Signed by:   Vikki Ports on 04/17/2010   Method used:   Faxed to ...       [facility] (mail-order)             , [place]         Ph:        Fax: [phone]   RxID:   [id number]

## 2010-12-05 NOTE — Assessment & Plan Note (Signed)
Summary: Cardiology Nuclear Study  Nuclear Med Background Indications for Stress Test: Evaluation for Ischemia, Stent Patency   History: Abnormal EKG, Echo, Heart Catheterization, Myocardial Infarction, Myocardial Perfusion Study, Stents  History Comments: '02 IWMI w/ V. Fib > RCA Stent '08 AWMI > Stents: LAD, RCA '08 Echo: EF= 45% '09 MPS: EF=43%   Symptoms: Chest Pain with Exertion, Dizziness, DOE, Light-Headedness, SOB    Nuclear Pre-Procedure Cardiac Risk Factors: History of Smoking, Hypertension, Lipids Caffeine/Decaff Intake: none NPO After: 7:00 PM Lungs: clear IV 0.9% NS with Angio Cath: 20g     IV Site: R (AC) IV Started by: Irean Hong RN Chest Size (in) 40     Height (in): 68 Weight (lb): 160 BMI: 24.42  Nuclear Med Study 1 or 2 day study:  1 day     Stress Test Type:  Stress Reading MD:  Olga Millers, MD     Referring MD:  P.Nishan Resting Radionuclide:  Technetium 16m Tetrofosmin     Resting Radionuclide Dose:  11 mCi  Stress Radionuclide:  Technetium 47m Tetrofosmin     Stress Radionuclide Dose:  33 mCi   Stress Protocol Exercise Time (min):  8:31 min     Max HR:  111 bpm     Predicted Max HR:  166 bpm  Max Systolic BP: 165 mm Hg     Percent Max HR:  66.87 %     METS: 10.10 Rate Pressure Product:  16109    Stress Test Technologist:  Milana Na EMT-P     Nuclear Technologist:  Domenic Polite CNMT  Rest Procedure  Myocardial perfusion imaging was performed at rest 45 minutes following the intravenous administration of Myoview Technetium 42m Tetrofosmin.  Stress Procedure  The patient exercised for 8:31. The patient stopped due to fatigue and denied any chest pain.  There were no significant ST-T wave changes.  Myoview was injected at peak exercise and myocardial perfusion imaging was performed after a brief delay.  QPS Raw Data Images:  There is no interference from other nuclear activity. Stress Images:  There is decreased uptake in the  septum, apex and inferior walls. Rest Images:  There is decreased uptake in the septum, apex and inferior walls. Subtraction (SDS):  There is a fixed defect that is most consistent with a previous infarction. Transient Ischemic Dilatation:  .99  (Normal <1.22)  Lung/Heart Ratio:  .31  (Normal <0.45)  Quantitative Gated Spect Images QGS EDV:  152 ml QGS ESV:  90 ml QGS EF:  41 % QGS cine images:  Apical hypokinesis; evidence of left ventricular enlargement.   Overall Impression  Exercise Capacity: Fair exercise capacity. BP Response: Normal blood pressure response. Clinical Symptoms: No chest pain ECG Impression: No significant ST segment change suggestive of ischemia. Overall Impression: Abnormal stress nuclear study with prior septal, apical and inferior infarct; no ischemia.  Appended Document: Cardiology Nuclear Study pt aware of results

## 2010-12-05 NOTE — Consult Note (Signed)
Summary: [facility]   Imported By: Roderic Ovens 07/07/2010 15:54:50  _____________________________________________________________________  External Attachment:    Type:   Image     Comment:   External Document

## 2010-12-05 NOTE — Progress Notes (Signed)
Summary: Pt request call   Phone Note Call from Patient Call back at Work Phone [phone]   Summary of Call: Pt request call Initial call taken by: Judie Grieve,  June 21, 2010 9:25 AM  Follow-up for Phone Call        spoke with pt, he was seen in the ER yesterday. he developed a tightness in his chest and his work called 911. he states he was very hot and doing a pretty strenous job. he states he had taken NTG before EMS got there and his tightness was relieved on the way to the hosp. he has no problems since yesterday. he is unable to return to work until seen. dr [name] back in the office on monday. pt requested a note, will discuss with dr Riley Kill who saw the pt in the ER. [name], RN  June 21, 2010 10:16 AM  pt requesting another call re rpoblem he had yesterday-pls call [phone] [name]  June 21, 2010 2:32 PM   Additional Follow-up for Phone Call Additional follow up Details #1::        spoke with pt, he will get a note on monday when seen by dr Verdis Prime, RN  June 23, 2010 11:18 AM

## 2010-12-05 NOTE — Letter (Signed)
Summary: Cardiac Catheterization Instructions- Main Lab  [facility], Main Office  [address]   [place], [place] [address]   Phone: [phone]  Fax: [phone]     02/16/2010 MRN: [id number]  MANCIL PFENNING [address] [place], [place]  [address]  Dear Mr. Speyer,   You are scheduled for Cardiac Catheterization on [facility] 02-20-10              with Dr. Excell Seltzer           .  Please arrive at the [facility] at  10:30     a.m. on the day of your procedure.  1. DIET     _XXXXXXX___ Nothing to eat or drink after midnight except your medications with a sip of water.  2. Come to the [place] office on             for lab work.  The lab at [facility] is open from 8:30 a.m. to 1:30 p.m. and 2:30 p.m. to 5:00 p.m.  The lab at [address] [facility] is open from 7:30 a.m. to 5:30 p.m.  You do not have to be fasting.  3. MAKE SURE YOU TAKE YOUR ASPIRIN.  4. _____ DO NOT TAKE these medications before your procedure:         ________________________________________________________________________________      ____ YOU MAY TAKE ALL of your remaining medications with a small amount of water.      ____ START NEW medications:     ________________________________________________________________________________      ____ Eilene Ghazi instructions:     ________________________________________________________________________________  5. Plan for one night stay - bring personal belongings (i.e. toothpaste, toothbrush, etc.)  6. Bring a current list of your medications and current insurance cards.  7. Must have a responsible person to drive you home.   8. Someone must be with yu for the first 24 hours after you arrive home.  9. Please wear clothes that are easy to get on and off and wear slip-on shoes.  *Special note: Every effort is made to have your procedure done on time.  Occasionally there are emergencies that present themselves  at the hospital that may cause delays.  Please be patient if a delay does occur.  If you have any questions after you get home, please call the office at the number listed above.  [name], RN

## 2010-12-05 NOTE — Assessment & Plan Note (Signed)
Summary: rov/chest pain/dm      Allergies Added: NKDA  Visit Type:  Follow-up  CC:  pt states he had an eposide of hand numbness and chest discomfort-He went to ER pt states erythinh checked out ok..  History of Present Illness: F/U CAD with chronically occluded RCA and recent stenting of LAD by Dr Excell Seltzer at end of May.  CRF well modified.  Compliant with Plavix. Will send to [facility] for PY2 testing.  Has been off cigarettes since about a week before procedure.  Counseled for less than 10 minutes.  Difficult due to family and co-workers that smoke.  Discussed nicotine replacement if needed.  Also told him to call if he goes back to smoking so we could consider welbutrin and change him to Effient.  Not having any angina.  Fairly intolerant to recent heat wave  Recent fall with ? fractured clavicle.  Needs ortho F/U.  Explained to him importance of PY2 testing as he has not done it yet.  Current Problems (verified): 1)  Dyspnea  (ICD-786.05) 2)  Coronary Atherosclerosis Native Coronary Artery  (ICD-414.01) 3)  Mixed Hyperlipidemia  (ICD-272.2) 4)  Smoker  (ICD-305.1) 5)  Essential Hypertension, Benign  (ICD-401.1) 6)  Chest Pain  (ICD-786.50)  Current Medications (verified): 1)  Carvedilol 12.5 Mg Tabs (Carvedilol) .... Take One Tablet By Mouth Twice A Day 2)  Pravachol 80 Mg Tabs (Pravastatin Sodium) .[name].. 1 Tab By Mouth Once Daily 3)  Plavix 75 Mg Tabs (Clopidogrel Bisulfate) .... Take One Tablet By Mouth Daily 4)  Altace 5 Mg Caps (Ramipril) .[name].. 1 Tab By Mouth Once Daily 5)  Aspirin Ec 325 Mg Tbec (Aspirin) .... Take One Tablet By Mouth Daily  Allergies (verified): No Known Drug Allergies  Past History:  Past Medical History: Last updated: 10/11/2008 IMI: 05/22/2001 AMI:  11/23/2007 Hypercholesterolemia Smoking  Past Surgical History: Last updated: 10/11/2008 05/22/2001:  PCI RCA and LAD 11/11/2006:  PCI BMS LAD 11/15/2006  PCI BMS RCA  Family History: Last updated:  10/11/2008 non-contributory  Social History: Last updated: 10/11/2008 Married Two children 1 granson Use to Engineer, maintenance (nurse Lauren previous player) Still smoking 1 ppd  Review of Systems       Denies fever, malais, weight loss, blurry vision, decreased visual acuity, cough, sputum, SOB, hemoptysis, pleuritic pain, palpitaitons, heartburn, abdominal pain, melena, lower extremity edema, claudication, or rash.   Vital Signs:  Patient profile:   55 year old male Height:      68 inches Weight:      162 pounds BMI:     24.72 Pulse rate:   73 / minute BP sitting:   120 / 76  (left arm) Cuff size:   regular  Vitals Entered By: Burnett Kanaris, CNA (June 26, 2010 2:40 PM)  Physical Exam  General:  Affect appropriate Healthy:  appears stated age HEENT: normal Neck supple with no adenopathy JVP normal no bruits no thyromegaly Lungs clear with no wheezing and good diaphragmatic motion Heart:  S1/S2 no murmur,rub, gallop or click PMI normal Abdomen: benighn, BS positve, no tenderness, no AAA no bruit.  No HSM or HJR Distal pulses intact with no bruits No edema Neuro non-focal Skin warm and dry Right arm in sling   Impression & Recommendations:  Problem # 1:  CORONARY ATHEROSCLEROSIS NATIVE CORONARY ARTERY (ICD-414.01) Stable no angina.  Check Py2 and change to Effient if low His updated medication list for this problem includes:    Carvedilol 12.5 Mg Tabs (Carvedilol) .[name]... Take  one tablet by mouth twice a day    Plavix 75 Mg Tabs (Clopidogrel bisulfate) .[name]... Take one tablet by mouth daily    Altace 5 Mg Caps (Ramipril) .[name]... 1 tab by mouth once daily    Aspirin Ec 325 Mg Tbec (Aspirin) .[name]... Take one tablet by mouth daily  Problem # 2:  MIXED HYPERLIPIDEMIA (ICD-272.2) Continue statin labs in 3 months His updated medication list for this problem includes:    Pravachol 80 Mg Tabs (Pravastatin sodium) .[name]... 1 tab by mouth once daily  Problem # 3:  SMOKER  (ICD-305.1) Indicates abstaining still Will start Chantix per primary  Problem # 4:  ESSENTIAL HYPERTENSION, BENIGN (ICD-401.1) Well controlled His updated medication list for this problem includes:    Carvedilol 12.5 Mg Tabs (Carvedilol) .[name]... Take one tablet by mouth twice a day    Altace 5 Mg Caps (Ramipril) .[name]... 1 tab by mouth once daily    Aspirin Ec 325 Mg Tbec (Aspirin) .[name]... Take one tablet by mouth daily  Problem # 5:  FRACTURE, CLAVICLE, RIGHT (ICD-810.00) Needs F/U of arm and likely PT/OT if in sling for more than 2 weeks.    Appended Document: rov/chest pain/dm    Clinical Lists Changes  Orders: Added new Test order of T- * Misc. Laboratory test [phone]) - Signed Added new Referral order of Misc. Referral (Misc. Ref) - Signed

## 2010-12-05 NOTE — Miscellaneous (Signed)
  Clinical Lists Changes  Orders: Added new Service order of EKG w/ Interpretation ([id number]) - Signed 

## 2010-12-05 NOTE — Assessment & Plan Note (Signed)
Summary: F6W/POST CATH/DM  Medications Added ASPIRIN EC 325 MG TBEC (ASPIRIN) Take one tablet by mouth daily      Allergies Added: NKDA  CC:  follow up cath.  History of Present Illness: F/U CAD with chronically occluded RCA and recent stenting of LAD by Dr Excell Seltzer at end of May.  CRF well modified.  Compliant with Plavix. Will send to [facility] for PY2 testing.  Has been off cigarettes since about a week before procedure.  Counseled for less than 10 minutes.  Difficult due to family and co-workers that smoke.  Discussed nicotine replacement if needed.  Also told him to call if he goes back to smoking so we could consider welbutrin and change him to Effient.  Not having any angina.  Fairly intolerant to recent heat wave  Current Problems (verified): 1)  Dyspnea  (ICD-786.05) 2)  Coronary Atherosclerosis Native Coronary Artery  (ICD-414.01) 3)  Mixed Hyperlipidemia  (ICD-272.2) 4)  Smoker  (ICD-305.1) 5)  Essential Hypertension, Benign  (ICD-401.1) 6)  Chest Pain  (ICD-786.50)  Current Medications (verified): 1)  Carvedilol 12.5 Mg Tabs (Carvedilol) .... Take One Tablet By Mouth Twice A Day 2)  Pravachol 80 Mg Tabs (Pravastatin Sodium) .[name].. 1 Tab By Mouth Once Daily 3)  Plavix 75 Mg Tabs (Clopidogrel Bisulfate) .... Take One Tablet By Mouth Daily 4)  Altace 5 Mg Caps (Ramipril) .[name].. 1 Tab By Mouth Once Daily 5)  Aspirin Ec 325 Mg Tbec (Aspirin) .... 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   Vital Signs:  Patient profile:   55 year old male Height:      68 inches Weight:      166 pounds BMI:     25.33 Pulse rate:   73 / minute Resp:     14 per minute BP sitting:   150 / 84  (left arm)  Vitals Entered By: [name] (May 23, 2010 3:48 PM)  Physical Exam  General:  Affect appropriate Healthy:  appears stated age HEENT: normal Neck supple with no adenopathy JVP normal no bruits no thyromegaly Lungs clear with no wheezing and good diaphragmatic motion Heart:  S1/S2 no murmur,rub, gallop or click PMI normal Abdomen: benighn, BS positve, no tenderness, no AAA no bruit.  No HSM or HJR Distal pulses intact with no bruits No edema Neuro non-focal Skin warm and dry    Impression & Recommendations:  Problem # 1:  CORONARY ATHEROSCLEROSIS NATIVE CORONARY ARTERY (ICD-414.01)  Stable recent stent to LAD with chronicallyocclued RCA.  PY2 and assess platlet inhibition His updated medication list for this problem includes:    Carvedilol 12.5 Mg Tabs (Carvedilol) .[name]... Take one tablet by mouth twice a day    Plavix 75 Mg Tabs (Clopidogrel bisulfate) .[name]... Take one tablet by mouth daily    Altace 5 Mg Caps (Ramipril) .Marland KitchenMarland KitchenMarland KitchenMarland Kitchen  1 tab by mouth once daily    Aspirin Ec 325 Mg Tbec (Aspirin) .[name]... Take one tablet by mouth daily  His updated medication list for this problem includes:    Carvedilol 12.5 Mg Tabs (Carvedilol) .[name]... Take one tablet by mouth twice a day    Plavix 75 Mg Tabs (Clopidogrel bisulfate) .[name]... Take one tablet by mouth daily    Altace 5 Mg Caps (Ramipril) .[name]... 1 tab by mouth once daily    Aspirin Ec 325 Mg Tbec (Aspirin) .[name]... Take one tablet by mouth daily  Orders: Gastroenterology Referral  (GI)  Problem # 2:  MIXED HYPERLIPIDEMIA (ICD-272.2)  lab in 6 months no side effects His updated medication list for this problem includes:    Pravachol 80 Mg Tabs (Pravastatin sodium) .[name]... 1 tab by mouth once daily  His updated medication list for this problem includes:    Pravachol 80 Mg Tabs (Pravastatin sodium) .[name]... 1 tab by mouth once daily  Orders: Gastroenterology Referral (GI)  Problem # 3:  SMOKER (ICD-305.1) Continue abstinance.  Chantix black box warning prohibiits use. Will call for welbutrin or alternative program if he slips up  Patient Instructions: 1)  Your physician recommends that you schedule a follow-up appointment in: 6 months with Dr. [name] 2)  Your physician recommends that you continue on your current medications as directed. Please refer to the Current Medication list given to you today. 3)  Your physician recommends that you return for lab work next week at the hospital  : PY2 (platelet test) 4)  You have been referred to gastroenterology for screening colonoscopy with either Dr. Karolee Ohs or Dr. Lina Sar

## 2010-12-05 NOTE — Letter (Signed)
Summary: Cardiac Catheterization Instructions- Main Lab  [facility], Main Office  [address]   [place], [place] [address]   Phone: [phone]  Fax: [phone]     02/28/2010 MRN: [id number]  QUAME SPRATLIN [address] [place], [place]  [address]  Dear Mr. Skluzacek,   You are scheduled for Cardiac Catheterization on THURSDAY 03-16-10              with Dr. Excell Seltzer           .  Please arrive at the [facility] at 5:30      a.m. on the day of your procedure.  1. DIET     _XXXXX___ Nothing to eat or drink after midnight except your medications with a sip of water.  2. Come to the [place] office on [address] for lab work.  The lab at [facility] is open from 8:30 a.m. to 1:30 p.m. and 2:30 p.m. to 5:00 p.m.  The lab at [facility] is open from 7:30 a.m. to 5:30 p.m.  You do not have to be fasting.  3. MAKE SURE YOU TAKE YOUR ASPIRIN.  4. _____ DO NOT TAKE these medications before your procedure:         ________________________________________________________________________________      ____ YOU MAY TAKE ALL of your remaining medications with a small amount of water.      ____ START NEW medications:     ________________________________________________________________________________      ____ Eilene Ghazi instructions:     ________________________________________________________________________________  5. Plan for one night stay - bring personal belongings (i.e. toothpaste, toothbrush, etc.)  6. Bring a current list of your medications and current insurance cards.  7. Must have a responsible person to drive you home.   8. Someone must be with yu for the first 24 hours after you arrive home.  9. Please wear clothes that are easy to get on and off and wear slip-on shoes.  *Special note: Every effort is made to have your procedure done on time.  Occasionally there are emergencies that present themselves  at the hospital that may cause delays.  Please be patient if a delay does occur.  If you have any questions after you get home, please call the office at the number listed above.  [name], RN

## 2010-12-05 NOTE — Progress Notes (Signed)
Summary: rtn to wrok note/talk to nurse re episode   Phone Note Call from Patient   Reason for Call: Talk to Nurse Summary of Call: pt wants a rtn to work note asap-wants to go back tomorrow-fax #[phone] att: brenda hundley- pt also wants a call re an episode he had last week-pls call [phone] Initial call taken by: [name],  June 22, 2010 12:08 PM  Follow-up for Phone Call        Called patient back and advised him that I could not clear him to RTW tomorrow after reading Dr.Stuckey's hospital note from a few days ago. Patient works as a Administrator, [employer] and does perform some physically exertional job functions. I advised him that he needs to keep his appointment with Dr.Tempie Gibeault on Monday and he will decide at that time what his appropriate RTW status will be and will provide a note at that time. Patient verbalized understanding. Follow-up by: Suzan Garibaldi  RN

## 2010-12-05 NOTE — Progress Notes (Signed)
Summary: pt needs to rtn to work   Phone Note Call from Patient Call back at Work Phone [phone]   Caller: Patient Reason for Call: Talk to Nurse, Talk to Doctor Summary of Call: pt has a eph on 5/11 had cath yesterday and per pt he needs to return to work on Monday he cant stay out of work that long. Initial call taken by: Omer Jack,  February 21, 2010 8:23 AM  Follow-up for Phone Call        discussed with dr [name], pt needing exercise myoview. he is clear to return to work monday and the [employer] will be done prior to [employer]. [name], RN  February 21, 2010 8:41 AM

## 2010-12-07 NOTE — Procedures (Signed)
Summary: Colonoscopy  Patient: [name] Note: All result statuses are Final unless otherwise noted.  Tests: (1) Colonoscopy (COL)   COL Colonoscopy           DONE     [facility]     [address]     [place], [place]  [address]           COLONOSCOPY PROCEDURE REPORT     PATIENT:  [name], [name]  MR#:  [id number]     BIRTHDATE:  1956/01/03, 54 yrs. old  GENDER:  male     ENDOSCOPIST:  Judie Petit T. Russella Dar, MD, [facility]           PROCEDURE DATE:  10/16/2010     PROCEDURE:  Colon w/ inj sclerosis of hemorrhoids     ASA CLASS:  Class II     INDICATIONS:  1) hematochezia  2) heme positive stool     MEDICATIONS:   Fentanyl 75 mcg IV, Versed 7 mg IV     DESCRIPTION OF PROCEDURE:   After the risks benefits and     alternatives of the procedure were thoroughly explained, informed     consent was obtained.  Digital rectal exam was performed and     revealed no abnormalities.   The LB [id number] endoscope     was introduced through the anus and advanced to the cecum, which     was identified by both the appendix and ileocecal valve, without     limitations.  The quality of the prep was excellent, using     MoviPrep.  The instrument was then slowly withdrawn as the colon     was fully examined.     <<PROCEDUREIMAGES>>     FINDINGS:  A normal appearing cecum, ileocecal valve, and     appendiceal orifice were identified. The ascending, hepatic     flexure, transverse, splenic flexure, descending, sigmoid colon,     and rectum appeared unremarkable.  Internal hemorrhoids were     found. They were moderately sized. Injection sclerosis of     hemorrhoids with 2 cc of 23.4% saline, injected above the dentate     line. Retroflexed views in the rectum revealed no other findings     other than those already described. The time to cecum =  2     minutes. The scope was then withdrawn (time =  10.5  min) from the     patient and the procedure completed.           COMPLICATIONS:   None           ENDOSCOPIC IMPRESSION:     1) Normal colon     2) Internal hemorrhoids           RECOMMENDATIONS:     1) Continue current colorectal screening for "routine risk"     patients with a repeat colonoscopy in 10 years.           Venita Lick. Russella Dar, MD, [facility]           CC: Wendall Stade, MD           n.     Rosalie DoctorVenita Lick. Brodin Gelpi at 10/16/2010 02:29 PM           Kreg Shropshire, [phone]  Note: An exclamation mark (!) indicates a result that was not dispersed into the flowsheet. Document Creation Date: 10/16/2010 2:29 PM _______________________________________________________________________  (1) Order result status: Final Collection or  observation date-time: 10/16/2010 14:22 Requested date-time:  Receipt date-time:  Reported date-time:  Referring Physician:   Ordering Physician: Claudette Head [phone]) Specimen Source:  Source: Launa Grill Order Number: [id number] Lab site:   Appended Document: Colonoscopy    Clinical Lists Changes  Observations: Added new observation of COLONNXTDUE: 10/2020 (10/16/2010 15:42)

## 2011-01-18 LAB — CK TOTAL AND CKMB (NOT AT ARMC)
CK, MB: 1.2 ng/mL (ref 0.3–4.0)
CK, MB: 1.3 ng/mL (ref 0.3–4.0)
Relative Index: INVALID (ref 0.0–2.5)
Relative Index: INVALID (ref 0.0–2.5)
Total CK: 59 U/L (ref 7–232)
Total CK: 71 U/L (ref 7–232)

## 2011-01-18 LAB — BASIC METABOLIC PANEL
BUN: 8 mg/dL (ref 6–23)
CO2: 25 mEq/L (ref 19–32)
Calcium: 9.3 mg/dL (ref 8.4–10.5)
Chloride: 107 mEq/L (ref 96–112)
Creatinine, Ser: 0.57 mg/dL (ref 0.4–1.5)
GFR calc Af Amer: >60 mL/min (ref >60)
GFR calc non Af Amer: >60 mL/min (ref >60)
Glucose, Bld: 95 mg/dL (ref 70–99)
Potassium: 3.7 mEq/L (ref 3.5–5.1)
Sodium: 138 mEq/L (ref 135–145)

## 2011-01-18 LAB — URINALYSIS, ROUTINE W REFLEX MICROSCOPIC
Bilirubin Urine: NEGATIVE
Glucose, UA: NEGATIVE mg/dL
Hgb urine dipstick: NEGATIVE
Ketones, ur: NEGATIVE mg/dL
Nitrite: NEGATIVE
Protein, ur: NEGATIVE mg/dL
Specific Gravity, Urine: 1.011 (ref 1.005–1.030)
Urobilinogen, UA: 1 mg/dL (ref 0.0–1.0)
pH: 6.5 (ref 5.0–8.0)

## 2011-01-18 LAB — DIFFERENTIAL
Basophils Absolute: 0 10*3/uL (ref 0.0–0.1)
Basophils Relative: 0 % (ref 0–1)
Eosinophils Absolute: 0.3 10*3/uL (ref 0.0–0.7)
Eosinophils Relative: 3 % (ref 0–5)
Lymphocytes Relative: 20 % (ref 12–46)
Lymphs Abs: 2.3 10*3/uL (ref 0.7–4.0)
Monocytes Absolute: 0.8 10*3/uL (ref 0.1–1.0)
Monocytes Relative: 7 % (ref 3–12)
Neutro Abs: 8.1 10*3/uL — ABNORMAL HIGH (ref 1.7–7.7)
Neutrophils Relative %: 70 % (ref 43–77)

## 2011-01-18 LAB — CBC
HCT: 44.4 % (ref 39.0–52.0)
Hemoglobin: 15.1 g/dL (ref 13.0–17.0)
MCH: 30.4 pg (ref 26.0–34.0)
MCHC: 34 g/dL (ref 30.0–36.0)
MCV: 89.3 fL (ref 78.0–100.0)
Platelets: 175 10*3/uL (ref 150–400)
RBC: 4.97 MIL/uL (ref 4.22–5.81)
RDW: 12.6 % (ref 11.5–15.5)
WBC: 11.5 10*3/uL — ABNORMAL HIGH (ref 4.0–10.5)

## 2011-01-18 LAB — TROPONIN I
Troponin I: 0.01 ng/mL (ref 0.00–0.06)
Troponin I: <0.01 ng/mL (ref 0.00–0.06)

## 2011-01-18 LAB — RAPID URINE DRUG SCREEN, HOSP PERFORMED
Amphetamines: NOT DETECTED
Barbiturates: NOT DETECTED
Benzodiazepines: NOT DETECTED
Cocaine: NOT DETECTED
Opiates: NOT DETECTED
Tetrahydrocannabinol: POSITIVE — AB

## 2011-01-18 LAB — PLATELET INHIBITION P2Y12
P2Y12 % Inhibition: 72 %
Platelet Function  P2Y12: 76 [PRU] — ABNORMAL LOW (ref 194–418)
Platelet Function Baseline: 273 [PRU] (ref 194–418)

## 2011-01-23 LAB — BASIC METABOLIC PANEL
BUN: 10 mg/dL (ref 6–23)
BUN: 10 mg/dL (ref 6–23)
CO2: 26 mEq/L (ref 19–32)
CO2: 27 mEq/L (ref 19–32)
Calcium: 9 mg/dL (ref 8.4–10.5)
Calcium: 9.1 mg/dL (ref 8.4–10.5)
Chloride: 106 mEq/L (ref 96–112)
Chloride: 108 mEq/L (ref 96–112)
Creatinine, Ser: 0.59 mg/dL (ref 0.4–1.5)
Creatinine, Ser: 0.65 mg/dL (ref 0.4–1.5)
GFR calc Af Amer: >60 mL/min (ref >60)
GFR calc Af Amer: >60 mL/min (ref >60)
GFR calc non Af Amer: >60 mL/min (ref >60)
GFR calc non Af Amer: >60 mL/min (ref >60)
Glucose, Bld: 100 mg/dL — ABNORMAL HIGH (ref 70–99)
Glucose, Bld: 102 mg/dL — ABNORMAL HIGH (ref 70–99)
Potassium: 3.6 mEq/L (ref 3.5–5.1)
Potassium: 3.8 mEq/L (ref 3.5–5.1)
Sodium: 139 mEq/L (ref 135–145)
Sodium: 141 mEq/L (ref 135–145)

## 2011-01-23 LAB — CBC
HCT: 39.1 % (ref 39.0–52.0)
HCT: 41 % (ref 39.0–52.0)
Hemoglobin: 13.4 g/dL (ref 13.0–17.0)
Hemoglobin: 14.1 g/dL (ref 13.0–17.0)
MCHC: 34.2 g/dL (ref 30.0–36.0)
MCHC: 34.3 g/dL (ref 30.0–36.0)
MCV: 89.3 fL (ref 78.0–100.0)
MCV: 89.3 fL (ref 78.0–100.0)
Platelets: 162 10*3/uL (ref 150–400)
Platelets: 171 10*3/uL (ref 150–400)
RBC: 4.38 MIL/uL (ref 4.22–5.81)
RBC: 4.59 MIL/uL (ref 4.22–5.81)
RDW: 12.4 % (ref 11.5–15.5)
RDW: 12.5 % (ref 11.5–15.5)
WBC: 8.5 10*3/uL (ref 4.0–10.5)
WBC: 8.8 10*3/uL (ref 4.0–10.5)

## 2011-01-23 LAB — PROTIME-INR
INR: 1.1 (ref 0.00–1.49)
Prothrombin Time: 14.1 seconds (ref 11.6–15.2)

## 2011-03-20 NOTE — Assessment & Plan Note (Signed)
[facility]                            CARDIOLOGY OFFICE NOTE   [name], [name]                     MRN:          [id number]  DATE:10/11/2008                            DOB:          1956-08-20    [name] returns today for followup.  He has had multiple previous  interventions.  He had drug-eluting stents placed to his RCA and LAD  back in 2002.  In 2008, he had an anterior wall MI and subsequently had  bare-metal stents to the LAD and RCA.  Unfortunately, continues to  smoke.  I counseled him for less than 10 minutes today regarding this,  he hopes to lie them this holiday season.  He does not need any Chantix  or Wellbutrin.  He has quit [place] in the past.   He has not had any significant chest pain; however, he has high-risk  anatomy with a residual distal LAD lesion.  I told him that given the  multitude essence that he has not placed and his continued smoking, he  should have a followup stress test.   His risk factors are otherwise well modified.  He has been compliant  with his aspirin and Plavix.  I asked him to see how much Prasugrel  would cost him in case we ever needed to use this.   His current medicines include Plavix 75 a day, an aspirin a day, CPAP  machine, Pravachol 80 a day, Altace 5 a day, carvedilol to be increased  to 12.5 b.i.d.   His exam is remarkable for a middle-aged male in no distress.  Blood  pressure is 142/79, pulse 76 and regular, weight 167 afebrile.  HEENT  unremarkable.  Carotids are normal without bruit.  No lymphadenopathy,  thyromegaly, or JVP elevation.  Lungs are clear.  Good diaphragmatic  motion.  No wheezing.  S1 and S2.  Normal heart sounds.  PMI normal.  Abdomen is benign.  Bowel sounds positive.  No AAA.  No tenderness.  No  bruit.  No hepatosplenomegaly.  No hepatojugular reflux tenderness.  Distal pulses intact.  No edema.  Neuro nonfocal.  Skin warm and dry.  No muscular weakness.   EKG shows sinus rhythm with old anterior and inferior wall MIs.   IMPRESSION:  1. Ischemic cardiomyopathy.  His ejection fraction was in the 40      somewhat percent range by MRI and he did not qualify for a      defibrillator.  He had an event monitor which did not show      significant ventricular tachycardia.  We will increase his beta-      blocker for better medical therapy.  He will have followup stress      test, hopefully this will low risk.  2. Hypertension, currently well controlled.  We will continue current      dose of Altace.  3. Hypercholesterolemia.  Continue Pravachol.  Lipid and liver profile      in 6 months.  4. Smoking cessation.  The patient will call me if he wants me to call  him in any Chantix or Wellbutrin.  The importance of quitting      smoking was discussed at length with him.   If the stress test is low risk, I will see him back in 6 months.     [name]. [name], MD, [facility]  Electronically Signed    PCN/MedQ  DD: 10/11/2008  DT: 10/12/2008  Job #: [id number]

## 2011-03-23 NOTE — Discharge Summary (Signed)
[place]  Patient:    [name], [name]                   MRN: [id number] Adm. Date:  [id number] Disc. Date: [id number] Attending:  Colon Branch Dictator:   Joellyn Rued, P.A.-C. CC:         Dr. Lysbeth Galas in [facility]   Referring Physician Discharge [place]  DATE OF BIRTH:  [birth year]  SUMMARY OF HISTORY:  [name] is a 55 year old white male who was transported by EMS secondary to myocardial infarction cardiac arrest. According to the emergency medical services, patient was mowing the yard when he developed chest discomfort and near syncope.  His son called the emergency medical system whereupon they gave him three baby aspirin.  During transport, he went into ventricular fibrillation on two occasions requiring lidocaine and defibrillation.  On arrival to the emergency room, he was alert and oriented x 4 and he was taken directly to the catheterization lab.  His history is notable for multiple knee surgeries, tobacco use.  LABORATORY DATA:  Admission sodium 136, potassium 4.1, BUn 13, creatinine 0.7, glucose 133.  H&H 13.8 and 39.8, normal indices, platelets 238, wbcs 15.0. Initial CK was 616 with an MB 47.8.  Second CK was a peak of 1862 with an MB of 67.6.  Total cholesterol 187, triglycerides 244, HDL 28, LDL 110.  An EKG by EMS showed acute inferior posterior myocardial infarction. Subsequent EKGs showed evolving inferior myocardial infarction.  HOSPITAL COURSE:  [name] was taken emergently to the catheterization laboratory by Dr. [name].  Dr. [name] initiated cardiac catheterization. According to Dr. Donne Hazel progress notes, who performed angioplasty stenting to the mid RCA reducing this from 100% to 0%.  He also has a distal 50% RCA lesion, 30% proximal OM-1, 70% ramus, 70% mid LAD post the diagonal one, and a 30% lesion in the LAD between the diagonal two and diagonal three.  Left main had a 10-15% lesion and his ejection  fraction was 40% with inferior hypokinesis.  Post sheath removal and bedrest, he was doing well.  He did not have any further chest discomfort or ectopy.  Medications were adjusted. Lipids were abnormal; thus, he was started on Pravachol.  Cardiac rehab assisted with teaching and ambulation and by July 22 Dr. [name] felt that he could be discharged home.  DISCHARGE DIAGNOSES: 1. Acute inferior posterior myocardial infarction status post angioplasty    stenting of the left anterior descending artery with residual coronary    artery disease as previously described. 2. Tobacco use.  DISPOSITION:  He is discharged home.  MEDICATIONS: 1. Plavix 75 mg q.d. 2. Coated aspirin 325 q.d. 3. Lopressor 50 mg one-half tablet b.i.d. 4. Pravachol 20 q.h.s. 5. Altace 2.5 q.d. 6. Sublingual nitroglycerin as needed.  ACTIVITY:  He was given permission by Dr. [name] to return to work in one week.  No lifting, sexual activity, or heavy exertion until seen by the physician.  DIET:  Maintain low salt/fat/cholesterol diet.  WOUND CARE:  If he had any problems with his catheterization site he was asked to call immediately.  SPECIAL INSTRUCTIONS:  He was advised no smoking or tobacco products.  FOLLOW-UP:  He will see Dr. Helen Hashimoto. on August 16 at 9 a.m.  At that time, arrangements will be made for him to be readmitted for day surgery to have outpatient intervention done on his LAD.  He will also need fasting lipids and LFTs in  approximately six weeks to evaluate his hyperlipidemia and recent beginning of Pravachol.  He was instructed he was not to start cardiac rehab until his LAD has been opened. DD:  05/26/01 TD:  05/26/01 Job: [id number]

## 2011-03-23 NOTE — Cardiovascular Report (Signed)
[name], [name]              ACCOUNT NO.:  [id number]   MEDICAL RECORD NO.:  [id number]          PATIENT TYPE:  OIB   LOCATION:  1963                         FACILITY:  [facility]   PHYSICIAN:  Peter C. [name], MD, FACCDATE OF BIRTH:  29-Apr-1956   DATE OF PROCEDURE:  12/13/2006  DATE OF DISCHARGE:                            CARDIAC CATHETERIZATION   [name] is a 50-year patient with previous history of stenting of  the RCA and LAD.  He had a recent anterior wall myocardial infarction  after 2 hours of chest pain.   He has persistent deep T-wave inversions across the precordium on EKG.  He works as a Runner, broadcasting/film/video, remote from any hospital care.  Cath  was done to assess patency of recently stented LAD.   Left main coronary artery had a 20% discrete stenosis.   The proximal LAD had a 40% lesion just prior to the mid vessel stent,  the mid vessel stent itself was widely patent.  It appeared to be nicely  oversized.  The distal LAD had a 90% tubular stenosis at the apex of the  heart.  At this point the vessel was a 1.5 to 2 mm vessel.   First diagonal branch had 40-50% disease in the ostium.  Circumflex  coronary artery was somewhat of a small vessel.  There was 30% discrete  disease in the AV groove branch and 30% disease in the ostium of the  first obtuse marginal branch.   The right coronary artery was dominant.  There was 30% multiple discrete  lesions proximally.  The mid vessel stent was widely patent with 30-40%  in-stent restenosis, distal vessel at 30% multiple discrete lesions.  There was no right-to-left collaterals.   RAO ventriculography:  RAO ventriculography showed distal anterior wall,  inferior apical wall severe hypokinesis.  Ejection fraction was in the  40% range.  There was no mural apical thrombus.   Aortic pressure was 110/59, LV pressure was 117/70.   IMPRESSION:  I will review the films with Dr. Juanda Chance and Dr. Excell Seltzer.  I  am not sure that  it is worth dilating the distal left anterior  descending, it is a small vessel with somewhat poor runoff.  His stents  are widely patent.  He has had a significant decrease in his left  ventricular function.  I am not sure how much of this will recover.  He  did get his intervention fairly quickly after arriving from EPS.   The patient will probably benefit from an outpatient cardiac MRI to  assess scar tissue and quantitate ejection fraction, then we may enroll  him in the DETERMINE trial.   I think his revascularization is adequate.  He is not having chest pain.  He has persistent T-wave changes on his ECG, but a widely patent stent  to the major portion of the LAD.  We will continue to push his Coreg and  ACE inhibitor.   Addendum:  Films reviewed by Dr. Juanda Chance agrees with no intervention and  F.U for Determine Trial      Peter C. [name], MD, [facility]  Electronically Signed     PCN/MEDQ  D:  12/13/2006  T:  12/13/2006  Job:  [id number]

## 2011-03-23 NOTE — Cardiovascular Report (Signed)
NAMEMANNIX, KROEKER              ACCOUNT NO.:  [id number]   MEDICAL RECORD NO.:  [id number]          PATIENT TYPE:  INP   LOCATION:  2036                         FACILITY:  [facility]   PHYSICIAN:  Salvadore Farber, MD  DATE OF BIRTH:  Aug 01, 1956   DATE OF PROCEDURE:  11/15/2006  DATE OF DISCHARGE:                            CARDIAC CATHETERIZATION   PROCEDURE:  Bare metal stenting of the mid RCA.   INDICATIONS:  Mr. Demma is a 55 year old gentleman with a prior  inferior myocardial infarction with stenting of the mid RCA using a bare  metal stent.  He then represented with anterior myocardial infarction on  November 11, 2006.  This was treated successfully.  Postinfarct course has  been uncomplicated.  Prior to the most recent infarct, he had been  having class III stable angina.  At the time of the infarct, he was  noted to have an 80% stenosis just distal to the previously placed stent  in the right coronary artery.  The stent itself had only moderate in-  stent restenosis.  He was referred for percutaneous intervention on this  more distal lesion as it was felt likely to be the culprit for his pre-  existing stable angina.   PROCEDURAL TECHNIQUE:  Informed consent was obtained.  Under 1%  lidocaine local anesthesia, a 6-French sheath was placed in the right  common femoral artery.  Anticoagulation was initiated with bivalirudin.  ACT was confirmed be greater than 225 seconds.  A 6-French JR-4 guiding  catheter was advanced over wire and engaged in the ostium of the left  main.  A Prowater wire was advanced to the distal right coronary without  difficulty.  The lesion was directly stented using a 3 x 12-mm Liberte  deployed at 16 atmospheres.  The stent was then postdilated using a 3.25  x 12-mm Quantum at 18 atmospheres.  Immediately prior to post dilation,  he was noted to have a hazy area in the proximal vessel.  With no  additional treatment, the hazy area resolved completely.   He did not  have any chest discomfort.  Final angiography demonstrated no residual  stenosis, no dissection, and TIMI III flow to the distal vasculature.  The hazy region in the proximal vessel had completely resolved and there  was no dye staining.  Wire was then removed.  I feel that the hazy area  may have been a small thrombus on the wire, despite the appropriate ACT.   The arteriotomy was then closed using an AngioSeal device.  There was  initially some oozing which resolved with 6 minutes of manual  compression.  The patient was transferred to the holding room in stable  condition, having tolerated the procedure well.   COMPLICATIONS:  None.   IMPRESSION/RECOMMENDATIONS:  Successful stenting of the mid right  coronary artery, using a bare metal stent.  The stenosis was reduced  from 80% to 0%.   PLAN:  Discharge in the morning,      Salvadore Farber, MD  Electronically Signed     WED/MEDQ  D:  11/15/2006  T:  11/15/2006  Job:  [id number]   cc:   [name]. [name], MD, [facility]

## 2011-03-23 NOTE — Assessment & Plan Note (Signed)
[facility]                            CARDIOLOGY OFFICE NOTE   [name], [name]                     MRN:          [id number]  DATE:12/09/2006                            DOB:          02-Jan-1956    [name] returns today for follow up.  He was admitted to the hospital  from November 11, 2006 to November 16, 2006.   He had had previous stenting of the RCA and LAD in 2002.   Unfortunately, he presented to the hospital with an anterior wall MI.  I  actually happened to be on call that night.  He had extreme pain and was  quite uncomfortable.  He had in-stent restenosis for both the RCA and an  occlusion of his LAD.   The LAD was opened up acutely by Dr. Excell Seltzer.  The right coronary artery  was electively staged.   The patient has not felt well since his left the hospital.  He has had  some recurrent chest pain, fatigue and shortness of breath.   He has not had any significant fevers.  His EF by echo on November 12, 2006 was 45%.   Today in the office, the patient's EKG showed no R waves in the  precordium and deep T wave inversions throughout.  I told [name] I was  kind of concerned about the way his EKG looked.  It does not look like  an EKG that has normal perfusion to the LAD.   Given his recurrent chest pain and the fact that he works out at  [employer] and is at least an hour away from any hospital, I  recommended a repeat catheterization to him to document stent patency  and also to reassess left ventricular function.   The patient has had a very hard time with his cigarettes.  He has not  smoked, but has an unbelievable urge to do so.  I told him we would  start him on Chantix today.   REVIEW OF SYSTEMS:  Otherwise remarkable for fatigue.  He has not had  any PND or orthopnea.  He has not had any lower extremity edema.   MEDICATIONS:  1. Lopressor 25 b.i.d.  2. Plavix 75 a day.  3. An aspirin a day.  4. CPAP as needed.  5.  Altace 5 mg a day.  6. Pravachol 80 a day.   ALLERGIES:  No known allergies.   PHYSICAL EXAMINATION:  VITAL SIGNS:  His weight today is 161.  Blood  pressure is 126/84, pulse 75 and regular.  HEENT:  Normal.  NECK:  Carotids have no bruit.  LUNGS:  Clear.  HEART:  There is an S1 and S2 with normal heart sounds.  There is no  rub.  ABDOMEN:  Benign.  EXTREMITIES:  The right femoral artery site is well healed.  His pulses  are intact with no edema.   ELECTROCARDIOGRAM:  His EKG was as indicated.  He had J-point elevation  in 2, 3 and F, marked T wave inversions across the precordium with no R  waves in V1 through V4.  IMPRESSION:  Recent anterior wall myocardial infarction with  persistently abnormal EKG, two vessel coronary artery disease, ejection  fraction 45%.   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  He has been having fatigue.  He  does not feel nearly as good as he did back in 2002 when he had an MI  and stent.  There is no evidence of post MI pericarditis.   I think that the patient may be a good candidate for the determined  trial after his heart catheterization.  We may do a cardiac MRI to  assess the scar burden and quantitate his ejection fraction.   [name] will check start his Chantix for smoking cessation.  Hopefully he  can maintain this.  I did tell [name] if there was early stent  restenosis, he may need to consider coronary bypass surgery.   We will continue him on a high-dose statin and check his liver function  tests and lipids in 6 months.   Further recommendations will be based on the results of his heart  catheterization.     [name]. [name], MD, [facility]  Electronically Signed    PCN/MedQ  DD: 12/09/2006  DT: 12/09/2006  Job #: [id number]

## 2011-03-23 NOTE — Assessment & Plan Note (Signed)
[facility]                            CARDIOLOGY OFFICE NOTE   RASHIED, CORALLO                     MRN:          [id number]  DATE:12/19/2006                            DOB:          1956-03-28    Luis returns today for followup.  We spent quite a bit of time with  Asahd today.   He has had previous stents to the RCA and LAD.  He was admitted on  November 11, 2006, with a recurrent anterior wall MI and stent thrombosis  to the LAD.  He had good recanalization by Dr. Excell Seltzer.  In the hospital  his ejection fraction appeared to be in the 40% range.   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  He is an hour-and-a-  half away from any medical care.  He has not returned to his job yet.  The season starts in March.  I have been concerned about his risks for  VT and sudden death.   Since discharge, the patient continues to have palpitations.  He has not  had any syncope or presyncope.   He has not had any significant chest pain.  His weakness is improving.  We did a cardiac MRI on the patient to assess his quantitative EF and  scar burden.  His EF is 47% but they had a very large area of scar.  There was a rend extending from the mid anterior wall through the apex  and inferior apex.  He had full-thickness scar in the septum and apex.  The remainder of the ventricle had subendocardial scar.   I had a long discussion with Abir regarding the risks of repeat MI and  stent thrombosis, congestive heart failure, and VT.  My biggest concern  is his risk of sudden death.  Unfortunately, even by the must criteria  his EF would have to 40% or less to get an EP study.  He was part of the  CHAMPION trial when he was admitted with his acute MI.  We will call the  people at the DETERMINE trial to see if we can get a waver to randomize  him into this trial.   My concern is that even though his EF is 47% by MRI that he has a large  scar burden.   I  was actually pleased to see that his EF appears to have improved and  the fact that he has a fair amount of subendocardial scar would indicate  the reason for this.   The patient's medications include:  1. Lopressor 25 b.i.d.  2. Plavix 75 a day.  3. Aspirin a day.  4. CPAP as needed.  5. Altace 5 a day.  6. Pravachol.   His exam is remarkable for a blood pressure of 120/70, pulse 70 and  regular.  HEENT is normal, lungs are clear.  There is an S1, S2 with  normal heart sounds.  Abdomen is benign, lower extremities intact pulses  no edema, skin is warm and dry, neurologic is nonfocal.   He had undergone repeat heart catheterization due to diffuse  T-wave  inversions that were persisting on his EKG.  His stents were widely  patent.   PLAN:  Tayven has an ischemic cardiomyopathy with an ejection fraction  of 47% now.  He will continue his aspirin and Plavix to prevent stent re-  thrombosis.   He has had a repeat catheterization already and his stents are patent  despite the markedly abnormal ECG.   He has not had any symptoms of congestive heart failure and his EF is  actually improving.  We will continue his Altace any may try to escalate  the dose in 3 months.   He is having palpitations but no syncope.  Will give him a CardioNet  monitor for 3 weeks to assess for nonsustained VT or other arrhythmias.   We will talk to the DETERMINE trial and see if we can get a waiver to  randomize him into this trial, as I honestly think he would benefit from  a defibrillator.   Again, we had a lengthy discussion with his mother and Doyt today.  He  has a few complicated issues in regards to his heart.   We showed him his MRI images and he understands the issues involved in  regards to his risks for recurrent MI, congestive heart failure, or  sudden death.   My hopes are that we can get a waiver for the DETERMINE trial and he  will get randomized to defibrillator therapy.   For the  time being, Mussa will stay in [place] and not go back out  to the [employer] until we further assess his palpitations and ability  to get into a research trial.     Theron Arista C. [name], MD, [facility]  Electronically Signed    PCN/MedQ  DD: 12/19/2006  DT: 12/19/2006  Job #: [id number]

## 2011-03-23 NOTE — Discharge Summary (Signed)
[place]  Patient:    [name], [name] Visit Number: [id number] MRN: [id number]          Service Type: CAT Location: [id number] Attending Physician:  Daisey Must Dictated by:   Abelino Derrick, P.A.C. [facility] Adm. Date:  06/27/2001   CC:         [name], M.D.   Referring Physician Discharge [facility]  HISTORY OF PRESENT ILLNESS:  [name] is a 55 year old male followed by [name]. [name], M.D., and [name], M.D., [place]  He presented in July of this year with an acute DMI treated with an RCA intervention.  He did have residual 70% LAD.  He was brought back electively for re-evaluation of the LAD.  HOSPITAL COURSE:  The patient was admitted on June 27, 2001, for catheterization which was done by [name], M.D.  This revealed a 70% LAD that was dilated and stented.  He was put on Integrilin post procedure. Dr. Chales Gill recommends Plavix for one month.  His RCA appeared patent.  He tolerated the procedure well and was stable postoperatively.  DISCHARGE MEDICATIONS: 1. Pravachol 20 mg h.s. 2. Plavix 75 mg a day for four weeks. 3. Altace 2.5 mg a day. 4. Lopressor 25 mg b.i.d. 5. Coated aspirin q.d. 6. Nitroglycerin sublingual p.r.n.  LABORATORY DATA:  Sodium 140, potassium 3.9, BUN 9, creatinine 0.8.  White count 6.6, hemoglobin 13.7, hematocrit 39.7, platelets 184.  The EKG showed sinus rhythm with inferior T-wave inversion.  DISPOSITION:  The patient was discharged in stable condition.  FOLLOW-UP:  Will follow up in [name] office and withDr. Celedonio Gill. Dictated by:   Abelino Derrick, P.A.C. [facility] Attending Physician:  Daisey Must DD:  06/28/01 TD:  06/28/01 Job: [id number]

## 2011-03-23 NOTE — Cardiovascular Report (Signed)
[place]. [facility]  Patient:    [name], [name]                   MRN: [id number] Proc. Date: 05/22/01 Adm. Date:  [id number] Attending:  Colon Branch CC:         [name]. [name], M.D. [facility]  Colon Flattery, D.O.   Cardiac Catheterization  PROCEDURES PERFORMED: 1. Left heart catheterization. 2. Left ventriculogram. 3. Selective coronary angiography. 4. Percutaneous transluminal coronary angiography and stenting of the mid    right coronary artery.  DIAGNOSES: 1. Two-vessel coronary artery disease. 2. Mild left ventricular systolic dysfunction. 3. Acute inferior wall myocardial infarction. 4. Ventricular fibrillation.  HISTORY:  [name] is a 55 year old white male with multiple cardiac risk factors and no known prior history of coronary artery disease, who presents with acute onset of substernal chest discomfort while mowing his lawn.  The pt notes the onset of pain at approximately 10 a.m. and was found by EMS to have ST elevation, consistent with acute inferior wall myocardial infarction.  He is brought to the catheterization laboratory emergently.  TECHNIQUE:  Informed consent was obtained.  The patient was brought to the catheterization laboratory.  A 7-French arterial and a 6-French venous sheath was placed in the right groin using the modified Seldinger technique.  Left heart catheterization and left ventriculogram were then performed in the usual fashion using preformed 6-French Judkins catheters.  FINDINGS:  The initial findings are as follows:  1. Left main trunk:  Large caliber vessel with mild diffuse disease of 10-15%. 2. Left anterior descending:  This is a large caliber vessel that provides    three diagonal branches.  There is a high-grade narrowing of 70% in the mid    section, after the first diagonal branch.  Further narrowings of 30% are    noted between the second and third diagonal branches, with mild diffuse  irregularities in the apical segment.  The diagonal branches have mild    diffuse disease of 30%. 3. Left circumflex system:  This is a medium caliber vessel that provides two    marginal branches.  The first marginal branch is a large caliber vessel    that provides a small subbranch in its proximal segment and then bifurcates    distally.  The marginal branch has mild disease of 30% in the proximal    segment.  The inferior branch of the distal bifurcation has a focal    high-grade narrowing of 70-80%.  The second marginal branch has mild    irregularities. 4. Right coronary artery:  Dominant.  This is a large caliber vessel that    will later be seen to have posterior descending artery and several    posterior ventricular branches.  The right coronary artery is occluded in    its mid section.  Upon recanalization, we will see severe diffuse disease    in the mid section and a further narrowing of 50% in the distal section. 5. Left ventricle:  Mildly dilated end-systolic and end-diastolic dimensions.    Overall left ventricular function is mildly impaired, with ejection    fraction of approximately 40%.  There is hypokinesis of the inferior wall.    No mitral regurgitation is noted.  LV pressure is 145/15.  Aortic is    145/90.  LV EDP is 20.  With these findings, we elected to proceed with percutaneous intervention to the right coronary artery.  The patient was enrolled  in the AIMI study and randomized to standard therapy. He was given 5000 units of heparin intravenously to maintain ACT of approximately 300 seconds, as well as Integrilin double bolus on a weight-adjusted basis.  He was also given 300 mg of Plavix orally and had previously received aspirin.  A 7-French JR-4 guide catheter was used to engage the right coronary artery and a 0.014 inch high-torque floppy wire was used to cross the lesion.  Repeat angiography now showed recanalization of the vessel at  approximately 11:36 a.m.  This showed the severe diffuse disease in the mid section of the RCA, as well as a distal lesion of 50%.  A 3.0 x 20 mm OpenSail balloon was introduced and used to predilate the lesion at 8 atm for 30 seconds.  The balloon was brought back slightly and used to further dilate the proximal segment lesion at 8 atm for 30 seconds.  Repeat angiography showed improvement in distal flow, with recanalization of the vessel.  There was, however, severe diffuse disease remaining in the vessel with a high-grade narrowing of 60-70% at the site of 100% occlusion.  We elected to proceed with percutaneous stenting.  A 3.5 x 28 mm Penta stent was introduced and carefully positioned under cineangiography in the mid right coronary artery and deployed at 8 atm for 60 seconds.  There was evidence of residual waste in the mid section of the vessel and a 3.5 x 15 mm Quantum monorail balloon was then introduced.  This was used to postdilate the distal and proximal segments of the stent at 12 atm for 30 seconds and used to further dilate the area of stenosis at 16 atm for 30 seconds.  Angiography after the administration of intracoronary nitroglycerin now showed an excellent result with no residual stenosis and full apposition of the stent with the vessel wall and TIMI-3 flow through the right coronary artery.  There was no evidence of distal thromboembolic phenomenon.  Final angiography was performed in various projections, confirming no distal vessel damage and significant improvement in vessel flow. The guide catheter was then removed and the sheath secured into position.  The patient tolerated the procedure well and was transferred to the CCU in stable condition.  FINAL RESULTS:  Successful percutaneous transluminal coronary angiography and stenting of the mid right coronary artery with reduction of a 100% occlusion to 0% with placement of a 3.5 x 28 mm Penta stent and improvement  of TIMI grade 0 to TIMI grade 3 flow.   ASSESSMENT AND PLAN:  [name] is a 55 year old gentleman with advanced two-vessel coronary artery disease who presents with an acute inferior wall myocardial infarction.  The patient will be stabilized in the intensive care unit and allowed to recover from his infarction.  In approximately four to six weeks time, percutaneous intervention to the LAD may be considered. DD:  05/22/01 TD:  05/22/01 Job: [id number]

## 2011-03-23 NOTE — H&P (Signed)
NAMEMAVIS, FICHERA              ACCOUNT NO.:  [id number]   MEDICAL RECORD NO.:  [id number]          PATIENT TYPE:  INP   LOCATION:  2807                         FACILITY:  [facility]   PHYSICIAN:  [name]. [name], MD, FACCDATE OF BIRTH:  03-Oct-1956   DATE OF ADMISSION:  11/11/2006  DATE OF DISCHARGE:                              HISTORY & PHYSICAL   Mr. Trethewey is a 55 year old patient of myself.  He has a history of  inferior wall MI with stenting of the right coronary artery in 2002.  He  subsequently had a staged stent to the mid-LAD.   I saw him in the office a few weeks ago.  He continues to smoke since he  was not having any significant chest pain or angina at the time.  However, because his stents were five years old and he continued to  smoke, we recommended a Myoview.  The patient wanted to have it in  February.  He presented today to the emergency room with an ST-segment  elevation anterior wall MI.  Duration of the pain was approximately 30  minutes prior to arrival in the ER.  He was stabilized.  Had a heart  rate of 88 and blood pressure of 130/70.  He had received about 10 mg of  morphine.  We gave him an additional 4 mg of morphine and 3000 units of  heparin.  He was brought immediately to the cath lab.   His home medications included Pravachol 40 a day, Altace 2-1/2 a day,  Lopressor 25 b.i.d., aspirin a day and Plavix.  He has been compliant  with his medications.   He does drink on occasion.   His past medical history is otherwise unremarkable.   Family history is positive for premature coronary disease in his father.   The patient has no known drug allergies.   Past surgical history is unremarkable.   On exam, he is in considerable pain.  Blood pressure is 138/70, pulse 88  and regular.  HEENT is normal.  There is no carotid bruits.  No  lymphadenopathy.  Lungs are clear.  The is S1-S2 with normal heart  sounds.  Abdomen is benign.  He has good peripheral  and femoral pulses  with no bruit.  His EKG shows an acute ST elevation, anterior wall MI.   IMPRESSION:  Acute ST-segment elevation anterior wall myocardial  infarction that will have to be treated with direct angioplasty.  Probable thrombotic occlusion of the mid-left anterior descending artery  stent.   Dr. Excell Seltzer arrived in the cath lab after sheath insertion and will  proceed with intervention.      [name]. [name], MD, [facility]  Electronically Signed     PCN/MEDQ  D:  11/11/2006  T:  11/12/2006  Job:  [id number]

## 2011-03-23 NOTE — Discharge Summary (Signed)
. [facility]  Patient:    RODGERS, LIKES RAY Visit Number: [id number] MRN: [id number]          Service Type: MED Location: 2000 2033 [id number] Attending Physician:  Colon Branch Dictated by:   Joellyn Rued, P.A.-C. Admit Date:  08/30/2001 Disc. Date: 08/31/01   CC:         Molly Maduro L. Allen Derry, M.D.  [name]. [name], M.D. [facility]   Discharge Summary  DATE OF BIRTH:  Jan 06, 1956.  HISTORY OF PRESENT ILLNESS:  Mr. Bechtol is a 55 year old white male who is brought to the emergency room by his family secondary to bilateral upper extremity numbness.  On Saturday afternoon, while vacuuming out his car at noon, he developed bilateral hand numbing and his right arm fell asleep.  He became nauseated. He gave these sensations of 6 to 8 over 0 to 10.  He felt that this was similar to the way his prior myocardial infarction but it was not as bad. There was some relief with rest.  He denied any associated shortness of breath or diaphoresis.  After about 45 minutes, he called our answering service and contacted our practice.  He had not taken any sublingual nitroglycerin.  He was instructed to begin taking sublingual nitroglycerin.  It did reduce his discomfort to a 5 over 0 to 10 and he took a second sublingual nitroglycerin.  He was given his options for being evaluated, and with his past medical history, it was recommended that he come to the emergency room.  On the way to the hospital, he had taken a third sublingual nitroglycerin and his discomfort was relieved by the time of his arrival.  This is the first time since his myocardial infarction that he has had any reoccurrence of any similar symptoms.  He did note that with his prior MI, the symptoms started as described above, that over the several hours that his symptoms had actually lasted it had progressed to chest heaviness.  With the episode above, he did not let it progress.  He denies any  exertional limitations, chest discomfort, shortness of breath, palpitations, orthopnea, PND, edema, or syncope.  His history is notable for an acute inferior myocardial infarction associated with a ventricular fibrillation cardiac arrest requiring defibrillation by EMS in July 2002.  Emergent catheterization at that time showed a 100% RCA, 50% distal RCA, 70% mid-LAD, 30% distal LAD, 30% OM1, 70% OM1 branch with an EF of 40% and inferior hypokinesis.  He underwent angioplasty stenting to the RCA. The remainder of his course was uncomplicated and he returned in August for an elective angioplasty stenting to the LAD and he was started on treatment for his hyperlipidemia.  He has reduced his smoking from two packs a day down to four to five cigarettes a day.  LABORATORY DATA:  Admission H&H was 15.0, 44.3, platelets 197,000, WBC 17.3. PT 13.3, PTT 26.  Initial enzymes were negative.  EKG showed normal sinus rhythm, early repolarization, negative change from August 2002.  Chest x-ray did not show any active disease.  HOSPITAL COURSE:  He was admitted to unit 2000.  Overnight, he did not have any further symptoms or other complaints.  Enzymes and EKGs were negative for myocardial infarction.  CBC on the morning of the 27th was 13.4, 39.5, platelets 180,000, WBC is 9.7.  Fasting lipids performed on the morning of the 27th showed a total cholesterol of 188, triglycerides 173, HDL 38, and LDL 127.  Stress  Cardiolite was performed on August 31, 2001.  Using the Bruce protocol, he ambulated for a total of 12 minutes and 45 seconds.  The test was discontinued secondary to both his legs hurting, i.e. shin splints.  He did not have any chest discomfort, shortness of breath, EKG changes.  It was noted that within the first 30 seconds of recovery he had occasional PVCs.  Images were reviewed by Dr. Arturo Morton. Stuckey.  He did not have any perfusion defects or scarring, normal wall motion  abnormality with an ejection fraction of 63%.  After reviewing the chart, Dr. Arturo Morton. Stuckey felt that he could be discharged home and made some recommendations in regards to resuming Plavix for nine months, discontinuing smoking, increase Pravachol to 40 mg and he needs to participate in cardiac rehabilitation.  He was initially contacted prior to the elected stent placement to his LAD; however, nobody has returned calls to him after that event.  DISCHARGE DIAGNOSES: 1. Acute coronary syndrome with a negative stress Cardiolite history as    previously. 2. Hyperlipidemia.  DISPOSITION:  He is discharged home.  DISCHARGE MEDICATIONS: 1. His Pravachol was increased to 40 mg q.h.s. 2. He was to continue Altace 2 mg q.d. 3. Lopressor 50 mg 1/2 tablet b.i.d. 4. Coated aspirin 325 mg q.d. 5. Sublingual nitroglycerin p.r.n. 6. He received a prescription for Plavix 75 mg q.d. for 9 months.  DISCHARGE INSTRUCTIONS:  He is asked to maintain a low salt, fat, cholesterol diet.  Avoid smoking or tobacco products.  He will need blood work in six weeks to follow up his LFTs and lipid profile.  He was asked to call our office on Monday morning to arrange a one-month appointment with Dr. Theron Arista C. Nishan.  Our office also needs to contact cardiac rehabilitation for them to contact him and initiate his participation.Dictated by:   Joellyn Rued, P.A.-C.  Attending Physician:  Colon Branch DD:  08/31/01 TD:  08/31/01 Job: [id number]

## 2011-03-23 NOTE — Assessment & Plan Note (Signed)
[name]                            CARDIOLOGY OFFICE NOTE   KESTON, SEEVER                     MRN:          [id number]  DATE:10/25/2006                            DOB:          05-26-1956    [name] returns today for followup.  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  Despite having a wife and daughter at home he only comes back about once  a month.   He is status post angioplasty and stenting of the right coronary artery  and LAD in 2002.  He had a negative stress test at [facility] last  year.  However, he continues to smoke a pack a day.  We talked about  this, he is not really motivated to quit and declines use of Chantix.  The patient has not had any significant chest pain.   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  He has been compliant with his meds.   REVIEW OF SYSTEMS:  Remarkable for no evidence of palpitation, syncope,  PND, orthopnea or chest pain.   MEDICATIONS:  1. Lopressor 25 b.i.d.  2. Plavix 75 a day.  3. Altace 2.5 a day.  4. An aspirin a day.  5. Pravachol 40 a day.  6. CPAP machine.   PHYSICAL EXAMINATION:  Blood pressure is 128/70, pulse is 70 and  regular.  HEENT:  Normal.  There are no carotid bruits, no lymphadenopathy.  LUNGS:  Clear.  There is an S1, S2 with normal heart sounds.  ABDOMEN:  Benign.  LOWER EXTREMITIES:  Intact pulses, no edema.  NEUROLOGIC:  Nonfocal.  SKIN:  Warm and dry.   Baseline EKG is normal.   IMPRESSION:  Stable 2-vessel coronary disease with stenting of the right  coronary artery and left anterior descending.  However, the patient has  continued smoking.  I think as long as he smokes he should have a stress  test every year.  We will try to arrange this for February.  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   He has sublingual  nitroglycerin in case he needs it.  His risk factors  have been well modified, his liver function tests were normal on  October 25, 2005, and his LDL cholesterol has been under 80.     [name]. [name], MD, [facility]  Electronically Signed    PCN/MedQ  DD: 10/25/2006  DT: 10/25/2006  Job #: [id number]

## 2011-03-23 NOTE — Consult Note (Signed)
NAME:  [name], [name]                      ACCOUNT NO.:  [id number]   MEDICAL RECORD NO.:  [id number]                   PATIENT TYPE:  INP   LOCATION:  1823                                 FACILITY:  [facility]   PHYSICIAN:  Carole Binning, M.D. [facility]         DATE OF BIRTH:  21-Mar-1956   DATE OF CONSULTATION:  DATE OF DISCHARGE:                                   CONSULTATION   CARDIOLOGY EMERGENCY ROOM CONSULTATION   CONSULTING PHYSICIAN:  Carole Binning, M.D. [facility]   CHIEF COMPLAINT:  Chest pain.   HISTORY OF PRESENT ILLNESS:  This patient is a 55 year old male with a  history of coronary artery disease.  His cardiac history dates back to July  2002 when he experienced an acute inferior wall myocardial infarction,  complicated by ventricular fibrillation and arrest requiring resuscitation.  He underwent stent placement in the right coronary artery for a totally  occluded coronary artery.  The following month he returned and underwent  elective stent placement in the mid left anterior descending artery.  He had  moderate residual disease in the left circumflex.  The patient has done well  since then without recurrent chest pain.  He has remained very active  physically without any symptoms of exertional chest pain or dyspnea.   Two days ago, while he was washing his car he developed the onset of a chest  tightness.  It was described as a tight discomfort in the center of the  chest without radiation 4-5/10 in severity associated with some diaphoresis  and nausea.  He sat down and took 2 nitroglycerin with relief of his  discomfort.  Yesterday he felt well, but this morning he felt unwell.  Again he had mild chest tightness rated at 1-2/10 which lasted anywhere from  30-60 minutes and then resolved.  He contacted his physician and was  instructed to come to the emergency room.  He is currently pain free.   CARDIAC RISK FACTORS:  Positive for dyslipidemia and tobacco abuse.  He  has  a known history of coronary artery disease.   PAST MEDICAL HISTORY, SOCIAL HISTORY, FAMILY HISTORY, REVIEW OF SYSTEMS AND  CURRENT MEDICATIONS:  As outlined in the physician assistant consultation  note.   PHYSICAL EXAMINATION:  GENERAL: In general this is a well-appearing male in  no acute distress.  VITAL SIGNS:  Temperature 98.4, pulse 58 and regular, respirations 20, blood  pressure 105/68.  Oxygen saturation on room air is 97%.  SKIN: Warm and dry without generalized rash.  HEENT:  Normocephalic, atraumatic.  Sclerae anicteric.  Oral mucosa  unremarkable.  NECK:  No adenopathy or thyromegaly.  No JVD.  Carotid upstroke is normal  without bruit.  CHEST:  Clear to percussion and auscultation.  CARDIAC:  PMI nondisplaced.  Regular rate and rhythm.  Positive S4, normal  S1 and S2 without rub, murmur or S3.  ABDOMEN:  Soft, nontender without organomegaly.  Normal bowel  sounds, no  bruits.  EXTREMITIES:  No clubbing, cyanosis, or edema.  Peripheral pulses are 2+  throughout.   EKG today reveals sinus bradycardia at a rate of 58 with slight early  repolarization changes.  Otherwise EKG is normal.  Other laboratory data as  noted in the chart.   ASSESSMENT:  The patient is a 55 year old male with a history of known  coronary artery disease status post inferior myocardial infarction, status  post stents to the right coronary artery and left anterior descending  artery.  He presents after two episodes of substernal chest discomfort.  He  is currently pain free.  Initial EKG and enzymes were negative.   PLAN:  We discussed options at this point. I recommended admission for  evaluation to rule out MI.  Further followed by functional testing.  However, at this point, the patient is very adamant about going home.  We,  therefore, compromised and the patient has agreed to wait for a second set  of cardiac makers to be drawn. If these remain normal and his EKG remains  normal and he  has no recurrent chest pain he will be allowed to return home,  but instructed to avoid any type of physical exertion until we can obtain a  stress Cardiolite which will be scheduled in the next day or two. If he has  any recurrent chest pain or cardiac markers are elevated he will be admitted  for further management.  The patient has also been instructed that should he  have any recurrent substernal chest pain requiring nitroglycerin that he  should return immediately to the emergency room.                                               Carole Binning, M.D. [facility]    MWP/MEDQ  D:  07/19/2003  T:  07/19/2003  Job:  [id number]   cc:   Colon Flattery, MD  [address]  [place]  [place] [address]  Fax: [phone]   Charlton Haws, M.D.

## 2011-03-23 NOTE — Cardiovascular Report (Signed)
[place]  Patient:    AEDIN, JEANSONNE RAY Visit Number: [id number] MRN: [id number]          Service Type: CAT Location: 6500 6523 [id number] Attending Physician:  Daisey Must Proc. Date: 06/27/01 Adm. Date:  06/27/2001 Disc. Date: 06/28/2001   CC:         Colon Flattery, D.O.   Cardiac Catheterization  PROCEDURES PERFORMED: 1. Selective coronary angiography. 2. Primary stenting of the mid left anterior descending artery.  DIAGNOSES: 1. Two-vessel coronary artery disease. 2. Status post inferior wall myocardial infarction.  HISTORY:  Mr. Deruiter is a 55 year old white male who recently suffered an inferior wall myocardial infarction on May 22, 2001, treated with acute intervention to the right coronary artery.  At that time, he was found to have severe narrowing of 70% in the mid LAD and moderate disease in the left coronary artery.  He has recovered from his inferior infarction and presents now for staged intervention to the LAD.  TECHNIQUE:  Informed consent was obtained.  The patient was brought to the catheterization laboratory and a 7-French sheath was placed in the right femoral artery.  The patient had been pretreated with aspirin and Plavix and he was given heparin and Integrilin on a weight-adjusted basis to maintain ACT of approximately 225 seconds.  A 7-French Voda left 3.5 guide catheter was used to engage the left coronary artery and a 0.014 inch Forte wire advanced into the distal LAD.  Selective angiography confirmed the presence of a high-grade narrowing of 70% in the mid LAD.  There was a further apical narrowing of 80%.  The first diagonal branch had a moderate compromise of 50% in the proximal left segment.  A 3.5 x 13 mm Penta stent was introduced and carefully positioned in the mid LAD immediately after the first diagonal branch to cover the stenosis.  This was deployed at 14 atm for 60 seconds.  Repeat angiography  showed an excellent result with no residual stenosis.  There was TIMI-3 flow through the LAD.  There was mild increase of compromise of the first diagonal branch from 50% to 70%, however, again flow was TIMI grade 3.  Final angiography was performed in the various projections showing no distal vessel damage.  The guiding catheter was then removed and the sheath secured in position.  The patient tolerated the procedure well and was transferred to the floor in stable condition.  FINAL RESULTS:  Successful primary stenting of the mid left anterior descending artery with reduction of a 70% narrowing to 0% with placement of a 3.5 x 13 mm Penta stent. Attending Physician:  Daisey Must DD:  06/27/01 TD:  06/29/01 Job: [id number]

## 2011-03-23 NOTE — Cardiovascular Report (Signed)
NAMEJESON, CAMACHO              ACCOUNT NO.:  [id number]   MEDICAL RECORD NO.:  [id number]          PATIENT TYPE:  INP   LOCATION:  2912                         FACILITY:  [facility]   PHYSICIAN:  Veverly Fells. Excell Seltzer, MD  DATE OF BIRTH:  Apr 22, 1956   DATE OF PROCEDURE:  11/11/2006  DATE OF DISCHARGE:                            CARDIAC CATHETERIZATION   PROCEDURES:  1. Left heart catheterization.  2. Selective coronary angiography.  3. Left ventricular angiography.  4. Thrombectomy of the LAD.  5. PTCA and stenting of the LAD.  6. IVUS of the LAD.  7. Angio-Seal closure of the right femoral arteriotomy.   INDICATIONS:  Mr. Burroughs is a very nice 55 year old gentleman from  [place], [place].  He has a history of coronary artery disease  and has had prior stenting approximately 5-6 years ago and the right  coronary artery is well as the mid LAD.  Earlier on the day of  admission, he developed the acute onset of chest pain similar to his  prior myocardial infarction pain.  He was brought from the field  directly to the cath lab for emergency cardiac catheterization.   PROCEDURAL DETAILS:  Risks and indications of the procedure were  reviewed in detail with the patient.  Informed consent was obtained by  Dr. [name].  Both groins were prepped and draped.  The right femoral  artery was accessed with a 6-French sheath using the modified Seldinger  technique by Dr. [name].  At that point, I insert a JR-4 diagnostic  catheter to image the right coronary artery.  A CLS 3.5 guide catheter  was then inserted into the left coronary artery as the LAD was the  suspected culprit vessel.  We gave 50 units per kg of heparin.  The LAD  had a thrombotic lesion in the mid portion of the vessel with TIMI-1  flow.  Integrilin was added for anticoagulation.  The patient was  enrolled in the CHAMPION study.  After receiving CHAMPION study drug, a  Cougar coronary guidewire was inserted beyond the  lesion into the distal  LAD.  Aspiration thrombectomy was were performed with a Fetch catheter  and reperfusion occurred after thrombectomy.  The patient developed an  accelerated idioventricular rhythm at that point.  His chest pain  greatly improved after aspiration thrombectomy.  Following thrombectomy,  there was clearly ulcerated plaque in the area just beyond the stented  segment of the mid LAD.  Then IVUS was advanced to evaluate the length  and reference vessel size as well as whether there was any thrombus  within the stented segment.  The IVUS demonstrated a complex plaque  beyond the stented segment but the stent was widely patent and had no  evidence of thrombus or malapposition.  The lesion was predilated with a  2.5 x 20 mm balloon to 6 atmospheres.  It was a long segment and was  ultimately stented with a 2.75 x 32 mm Liberte stent.  This was deployed  at 12 atmospheres.  A 3 Quantum Maverick Monorail balloon was then used  to post dilate the vessel at  16 and then 20 atmospheres.  Post procedure  IVUS demonstrated good apposition of the distal portion of the stent.  At the area of stent overlap, there was a small segment of stent that  appeared unopposed.  I elected to post dilate that segment with a 3.5 x  8 mm Quantum Maverick up to 16 atmospheres on two inflations.  At that  point there was excellent angiographic stent apposition.  There was an  area in the apical LAD that appeared tightly stenosed likely secondary  to downstream embolization from the lesion.  I wired that area and  treated it with a 2 x 9 mm maverick balloon up to the 4 and then 6  atmospheres.  There was an approximate 50% residual stenosis after  ballooning.  There was TIMI-3 flow in the vessel.   After the interventional portion of the procedure, an angled pigtail  catheter was inserted in the left ventricle and pressures were recorded.  A 30 degree RAO left ventriculogram was performed.  Pullback  across the  aortic valve was done.  At the conclusion of the procedure, a 6-French  Angio-Seal was used to seal the arteriotomy.   FINDINGS:  Aortic pressure 108/72 with a mean of 89, left ventricular  pressure 106/15 with an end-diastolic pressure of 26.   Right coronary artery is dominant.  There is a stent in the mid portion  of the right coronary artery that has a 80% focal stenosis in the distal  portion of the stent.  There is actually diffuse in-stent restenosis  throughout the mid and distal portion of the stent.  The distal right  coronary artery has 25% stenosis and then bifurcates into the PDA and  posterior AV segment that gives off three posterolateral branches.   The left main coronary artery is angiographically normal.  It  trifurcates into the LAD, intermediate branch and circumflex.  The LAD  is a large-caliber vessel throughout its proximal and mid portion.  The  proximal LAD has a 30% ostial stenosis and then a 50% proximal stenosis.  The stent in the mid LAD appears patent.  Just beyond the stent there is  a large filling defect in subtotal occlusion.  There is TIMI-1 flow  beyond that portion of the vessel.  There appears to be an approximate  75% stenosis beyond the area of subtotal occlusion in the mid LAD and  then there is a 95% stenosis out in the apical LAD.   The intermediate branch has luminal irregularities throughout.  It  bifurcates into multiple vessels and supplies a large territory.  The  left circumflex is a medium caliber vessel throughout and gives off one  posterolateral branch.  There is a small obtuse marginal branch.  The  true AV groove circumflex is small.   Left ventriculography demonstrates mid anterior wall hypokinesis and  apical akinesis.  The estimated left ventricular ejection fraction is  40%.  There is mitral regurgitation present but the assessment is complicated by premature ventricular beats.   ASSESSMENT:  1. Anterior  myocardial infarction secondary to thrombotic subtotal      occlusion of the mid left anterior descending.  2. Severe in-stent restenosis of the right coronary artery.  3. Moderate segmental left ventricular dysfunction.  4. Mitral regurgitation.   PLAN:  As described above, percutaneous intervention of the mid LAD was  performed.  A long bare metal stent was placed under the direction of  intravascular ultrasound.  The distal LAD out  in the apex was ballooned.  The patient will be treated with routine post MI medical therapy.  We  will plan on staging the RCA intervention for his severe in-stent  restenosis.  He likely will be able to come back as an outpatient and  have this done.  The patient will be admitted to the CCU.      Veverly Fells. Excell Seltzer, MD  Electronically Signed     MDC/MEDQ  D:  11/12/2006  T:  11/12/2006  Job:  [id number]

## 2011-03-23 NOTE — Discharge Summary (Signed)
[name], [name]              ACCOUNT NO.:  [id number]   MEDICAL RECORD NO.:  [id number]          PATIENT TYPE:  INP   LOCATION:  6529                         FACILITY:  [facility]   PHYSICIAN:  Isaac Pick. [name], MD, FACCDATE OF BIRTH:  03-04-1956   DATE OF ADMISSION:  11/11/2006  DATE OF DISCHARGE:  11/16/2006                               DISCHARGE SUMMARY   PRIMARY CARDIOLOGIST:  Isaac Arista C. [name], MD, [facility]   PRIMARY CARE PHYSICIAN:  [name], M.D.   DISCHARGING DIAGNOSES:  1. Anterior myocardial infarction, status post cardiac catheterization      with percutaneous coronary intervention this admission with      placement of bare metal stent to the mid right coronary artery on      November 15, 2006, by Dr. Samule Gill.  Also, thrombectomy of the left      anterior descending artery and percutaneous transluminal coronary      angioplasty and stenting of left anterior descending artery also in      addition to the bare metal stent to the mid right coronary artery.  2. Ongoing tobacco use  3. Hyperlipidemia.   PAST MEDICAL HISTORY:  1. Coronary artery disease, status post inferior MI with stent to the      mid RCA with a bare metal stent in 2002.  2. Tobacco use.  3. Hyperlipidemia  4. Hypertension.   PROCEDURES THIS ADMISSION:  1. Cardiac catheterization on January 7th, with PCI on January 11th.  2. A 2-D echocardiogram January 8th, showing EF of 45%, akinesis of      the anterior wall apex and mid/apical septum.   HOSPITAL COURSE:  [name] is a 55 year old gentleman from [place],  [place] with a history of coronary artery disease, prior stenting  in 2002, to the right coronary artery as well as the mid LAD.  On this  day of admission, the patient developed acute onset of chest pain  similar to pain prior to his MI in the past.  The patient was brought  directly to the cath lab from the EMS for emergency cardiac  catheterization. The patient found to have  anterior MI secondary to  thrombotic sub total occlusion of the mid LAD with severe in-stent  restenosis of the right coronary artery, mitral regurgitation also  noted.  A __________  bare metal stent was placed under the direction of  intravascular ultrasound with plans to proceed with staging of the RCA  intervention for the patient's severe in-stent restenosis.  The patient  was admitted to the CCU.  The patient tolerated procedure without  complications.  Echocardiogram done, results as stated above.  Tobacco  cessation and cardiac rehab education done with the patient.  The  patient returned to the cath lab on the 11th for PCI to the RCA by Dr.  Samule Gill.  The patient tolerated procedure without complications.  Day of  discharge Dr. Maurine Cane in to see patient.  Vital signs stable, normal  sinus rhythm.  Cath site stable.  Patient being discharged home.   H&H 12.9 and 37.8, platelet count 201.  Potassium 4.1, BUN and  creatinine 10 and 0.6.   At time of discharge, the patient has been given the post cardiac  catheterization discharge instructions along with instructions to call  our office Monday morning to schedule an appointment with Dr. Eden Gill  within the next 2 weeks.   1. He had a prescription for nitroglycerin p.r.n.  2. I have also given him prescriptions for Coreg 6.25 mg b.i.d.  3. Altace 5 mg.  4. Pravachol 80 mg.  5. Plavix 75 mg.   The patient has been instructed to stop his metoprolol.  Smoking  cessation strongly encouraged.  The patient instructed to call our  office for any problems from his cath site.   DURATION OF DISCHARGE ENCOUNTER:  Thirty minutes.      [name], ACNP      Isaac Pick. [name], MD, [facility]  Electronically Signed    MB/MEDQ  D:  11/16/2006  T:  11/16/2006  Job:  [id number]   cc:   [name], M.D.

## 2011-03-23 NOTE — Assessment & Plan Note (Signed)
[facility]                            CARDIOLOGY OFFICE NOTE   [name], [name]                     MRN:          [id number]  DATE:01/17/2007                            DOB:          January 19, 1956    [name] returns today for followup.  He is status post distant inferior  wall MI with a more recent anterior wall MI.   He had persistently abnormal EKG with deep T wave inversions across the  precordium.  A repeat heart cath done a few weeks ago showed his stents  to be widely patent.  Initially, his EF was in the 35% range.   He had a cardiac MRI, which showed significant amounts of scar, but an  EF of 47%.  He would not meet traditional criteria for a defibrillator.  We were trying to get him enrolled in the DETERMINE protocol.  The  patient had previously been working out on [employer] [place], but is staying  closer to home now.  He has a 27 year old son who is learning to be a  Psychologist, occupational, and a 48 year old daughter.  He is getting some work around town  and seeing some of his daughter's soccer games.  I told him I was happy  for him to be sticking [place] 6 months post MI.   We did CardioNet monitoring on him for 3 weeks.  He only had 1 episode  of non-sustained VT at 176 beats for 14 beats.  It was asymptomatic.   He has not had any significant chest pain, PND, or orthopnea.   EXAM:  Blood pressure 120/70, pulse 70 and regular.  HEENT:  Normal.  Carotids normal without bruits.  LUNGS:  Clear.  There is an S1, S2 with normal heart sounds.  ABDOMEN:  Benign.  LOWER EXTREMITIES:  Intact pulses.  No edema.  NEURO:  Nonfocal.  SKIN:  Warm and dry.   1. He is on an aspirin a day.  2. Plavix 75 a day.  3. He will have to get his dose of Coreg at home.  We had substituted      this for his Lopressor.  4. He is on CPAP at night.  5. Pravachol 80 a day.  6. Altace 5 a day.   IMPRESSION:  Stable  previous inferior wall myocardial infarction with  more recent anterior wall myocardial infarction, stents to the right  coronary artery and left anterior descending, patent by recent  catheterization, not meeting traditional criteria for automatic  implantable cardioverter defibrillator.  Possible enrollment in the  DETERMINE trial.  Ejection fraction 47% by MRI with only 1 episode of  nonsustained ventricular tachycardia by CardioNet monitoring.  I will  see him back in 3 months.  He will continue his current medical regimen.   He will call Gavin Pound to give [place] his current dose of Coreg.     [name]. [name], MD, [facility]  Electronically Signed    PCN/MedQ  DD: 01/17/2007  DT: 01/18/2007  Job #: [id number]

## 2011-05-11 ENCOUNTER — Other Ambulatory Visit: Payer: Self-pay | Admitting: *Deleted

## 2011-05-11 MED ORDER — RAMIPRIL 5 MG PO CAPS: 5.0000 mg | ORAL_CAPSULE | Freq: Every day | ORAL | Status: DC

## 2011-06-27 ENCOUNTER — Telehealth: Payer: Self-pay | Admitting: Cardiovascular Disease

## 2011-06-27 ENCOUNTER — Other Ambulatory Visit: Payer: Self-pay | Admitting: *Deleted

## 2011-06-27 MED ORDER — CLOPIDOGREL BISULFATE 75 MG PO TABS: 75.0000 mg | ORAL_TABLET | Freq: Every day | ORAL | Status: DC

## 2011-06-27 NOTE — Telephone Encounter (Signed)
Pt needs his plavix re-ordered and he wants to know if he can take generic pls [facility] [place] call [phone] this is the number to call to get prior auth for 90day supply

## 2011-09-05 ENCOUNTER — Other Ambulatory Visit: Payer: Self-pay | Admitting: *Deleted

## 2011-09-05 MED ORDER — CARVEDILOL 12.5 MG PO TABS: 12.5000 mg | ORAL_TABLET | Freq: Two times a day (BID) | ORAL | Status: DC

## 2012-04-16 ENCOUNTER — Other Ambulatory Visit: Payer: Self-pay | Admitting: Cardiovascular Disease

## 2012-04-16 MED ORDER — RAMIPRIL 5 MG PO CAPS: 5.0000 mg | ORAL_CAPSULE | Freq: Every day | ORAL | Status: DC

## 2012-04-16 MED ORDER — CLOPIDOGREL BISULFATE 75 MG PO TABS: 75.0000 mg | ORAL_TABLET | Freq: Every day | ORAL | Status: DC

## 2012-04-16 NOTE — Telephone Encounter (Signed)
Out of pills 

## 2012-05-20 ENCOUNTER — Other Ambulatory Visit: Payer: Self-pay | Admitting: Cardiovascular Disease

## 2012-05-20 MED ORDER — RAMIPRIL 5 MG PO CAPS: 5.0000 mg | ORAL_CAPSULE | Freq: Every day | ORAL | Status: DC

## 2012-05-20 NOTE — Telephone Encounter (Signed)
Pt needs appointment then refill can be made Fax Received. Refill Completed. Jadalee Westcott Chowoe (R.M.A)   

## 2012-05-20 NOTE — Telephone Encounter (Signed)
He is out of pills 

## 2012-05-21 ENCOUNTER — Telehealth: Payer: Self-pay | Admitting: Cardiovascular Disease

## 2012-05-21 DIAGNOSIS — I10 Essential (primary) hypertension: Secondary | ICD-10-CM

## 2012-05-21 MED ORDER — RAMIPRIL 5 MG PO CAPS: 5.0000 mg | ORAL_CAPSULE | Freq: Every day | ORAL | Status: DC

## 2012-05-21 NOTE — Telephone Encounter (Signed)
New msg Pt has rx [facility] for ramipril 5 mg but his BP today was 160/90  He [facility] [place] until he gets [employer]

## 2012-05-21 NOTE — Telephone Encounter (Signed)
Message left that [facility] would [facility] [place] and to call office if any questions. Pt is due for appt with Dr. [name]. I called back and left message for him to call to schedule. Will fill ramipril for one month with message to pharmacy to have pt  contact office to schedule appt.

## 2012-07-10 ENCOUNTER — Emergency Department (HOSPITAL_COMMUNITY): Payer: BC Managed Care – PPO

## 2012-07-10 ENCOUNTER — Encounter (HOSPITAL_COMMUNITY): Payer: Self-pay | Admitting: *Deleted

## 2012-07-10 ENCOUNTER — Emergency Department (HOSPITAL_COMMUNITY)
Admission: EM | Admit: 2012-07-10 | Discharge: 2012-07-10 | Disposition: A | Discharge status: Home / Self Care | Payer: BC Managed Care – PPO | Attending: Emergency Medicine | Admitting: Emergency Medicine

## 2012-07-10 DIAGNOSIS — N281 Cyst of kidney, acquired: Secondary | ICD-10-CM | POA: Insufficient documentation

## 2012-07-10 DIAGNOSIS — M545 Low back pain, unspecified: Secondary | ICD-10-CM | POA: Insufficient documentation

## 2012-07-10 DIAGNOSIS — R404 Transient alteration of awareness: Secondary | ICD-10-CM | POA: Insufficient documentation

## 2012-07-10 DIAGNOSIS — S40029A Contusion of unspecified upper arm, initial encounter: Secondary | ICD-10-CM | POA: Insufficient documentation

## 2012-07-10 DIAGNOSIS — S60222A Contusion of left hand, initial encounter: Secondary | ICD-10-CM

## 2012-07-10 DIAGNOSIS — S60221A Contusion of right hand, initial encounter: Secondary | ICD-10-CM

## 2012-07-10 DIAGNOSIS — S32009A Unspecified fracture of unspecified lumbar vertebra, initial encounter for closed fracture: Secondary | ICD-10-CM | POA: Insufficient documentation

## 2012-07-10 DIAGNOSIS — F17200 Nicotine dependence, unspecified, uncomplicated: Secondary | ICD-10-CM | POA: Insufficient documentation

## 2012-07-10 DIAGNOSIS — S32000A Wedge compression fracture of unspecified lumbar vertebra, initial encounter for closed fracture: Secondary | ICD-10-CM

## 2012-07-10 DIAGNOSIS — I252 Old myocardial infarction: Secondary | ICD-10-CM | POA: Insufficient documentation

## 2012-07-10 DIAGNOSIS — M25539 Pain in unspecified wrist: Secondary | ICD-10-CM | POA: Insufficient documentation

## 2012-07-10 DIAGNOSIS — M79609 Pain in unspecified limb: Secondary | ICD-10-CM | POA: Insufficient documentation

## 2012-07-10 DIAGNOSIS — S60229A Contusion of unspecified hand, initial encounter: Secondary | ICD-10-CM | POA: Insufficient documentation

## 2012-07-10 LAB — URINE MICROSCOPIC-ADD ON

## 2012-07-10 LAB — CBC
HCT: 48.3 % (ref 39.0–52.0)
Hemoglobin: 16.3 g/dL (ref 13.0–17.0)
MCH: 30.4 pg (ref 26.0–34.0)
MCHC: 33.7 g/dL (ref 30.0–36.0)
MCV: 90.1 fL (ref 78.0–100.0)
Platelets: 191 10*3/uL (ref 150–400)
RBC: 5.36 MIL/uL (ref 4.22–5.81)
RDW: 12.8 % (ref 11.5–15.5)
WBC: 24 10*3/uL — ABNORMAL HIGH (ref 4.0–10.5)

## 2012-07-10 LAB — COMPREHENSIVE METABOLIC PANEL
ALT: 18 U/L (ref 0–53)
AST: 25 U/L (ref 0–37)
Albumin: 4.1 g/dL (ref 3.5–5.2)
Alkaline Phosphatase: 75 U/L (ref 39–117)
BUN: 11 mg/dL (ref 6–23)
CO2: 25 mEq/L (ref 19–32)
Calcium: 9.8 mg/dL (ref 8.4–10.5)
Chloride: 103 mEq/L (ref 96–112)
Creatinine, Ser: 0.77 mg/dL (ref 0.50–1.35)
GFR calc Af Amer: >90 mL/min (ref >90)
GFR calc non Af Amer: >90 mL/min (ref >90)
Glucose, Bld: 113 mg/dL — ABNORMAL HIGH (ref 70–99)
Potassium: 3.8 mEq/L (ref 3.5–5.1)
Sodium: 137 mEq/L (ref 135–145)
Total Bilirubin: 0.7 mg/dL (ref 0.3–1.2)
Total Protein: 6.9 g/dL (ref 6.0–8.3)

## 2012-07-10 LAB — URINALYSIS, ROUTINE W REFLEX MICROSCOPIC
Bilirubin Urine: NEGATIVE
Glucose, UA: NEGATIVE mg/dL
Ketones, ur: NEGATIVE mg/dL
Leukocytes, UA: NEGATIVE
Nitrite: NEGATIVE
Protein, ur: 30 mg/dL — AB
Specific Gravity, Urine: 1.01 (ref 1.005–1.030)
Urobilinogen, UA: 2 mg/dL — ABNORMAL HIGH (ref 0.0–1.0)
pH: 7 (ref 5.0–8.0)

## 2012-07-10 MED ORDER — LORAZEPAM 2 MG/ML IJ SOLN
0.5000 mg | Freq: Once | INTRAMUSCULAR | Status: AC
  Administered 2012-07-10: 0.5 mg via INTRAVENOUS
  Filled 2012-07-10: qty 1

## 2012-07-10 MED ORDER — OXYCODONE-ACETAMINOPHEN 5-325 MG PO TABS: 1.0000 | ORAL_TABLET | Freq: Four times a day (QID) | ORAL | Status: AC | PRN

## 2012-07-10 MED ORDER — ONDANSETRON HCL 4 MG/2ML IJ SOLN
4.0000 mg | Freq: Once | INTRAMUSCULAR | Status: AC
  Administered 2012-07-10: 4 mg via INTRAVENOUS
  Filled 2012-07-10: qty 2

## 2012-07-10 MED ORDER — HYDROMORPHONE HCL PF 1 MG/ML IJ SOLN
1.0000 mg | Freq: Once | INTRAMUSCULAR | Status: AC
  Administered 2012-07-10: 1 mg via INTRAVENOUS
  Filled 2012-07-10: qty 1

## 2012-07-10 MED ORDER — HYDROMORPHONE HCL PF 1 MG/ML IJ SOLN
INTRAMUSCULAR | Status: AC
  Filled 2012-07-10: qty 1

## 2012-07-10 MED ORDER — IOHEXOL 300 MG/ML  SOLN
100.0000 mL | Freq: Once | INTRAMUSCULAR | Status: AC | PRN
  Administered 2012-07-10: 100 mL via INTRAVENOUS

## 2012-07-10 MED ORDER — HYDROMORPHONE HCL PF 1 MG/ML IJ SOLN
1.0000 mg | Freq: Once | INTRAMUSCULAR | Status: AC
  Administered 2012-07-10: 1 mg via INTRAVENOUS
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
## 2012-07-10 NOTE — ED Notes (Signed)
Pt in xray and CT 

## 2012-07-10 NOTE — ED Notes (Signed)
Pt large bruise noted to left upper inner arm

## 2012-07-10 NOTE — ED Notes (Signed)
Patients belongings removed and patient placed in pt gown. Family remains at bedside with pt.

## 2012-07-10 NOTE — ED Notes (Signed)
Pt refusing foley catheter at this time.
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
## 2012-07-10 NOTE — ED Notes (Signed)
Discharge instructions reviewed with pt, questions answered. Pt verbalized understanding.  

## 2012-07-10 NOTE — ED Notes (Signed)
c-collar applied in triage

## 2012-08-18 ENCOUNTER — Other Ambulatory Visit: Payer: Self-pay | Admitting: Cardiovascular Disease

## 2012-08-18 MED ORDER — CLOPIDOGREL BISULFATE 75 MG PO TABS: 75.0000 mg | ORAL_TABLET | Freq: Every day | ORAL | Status: DC

## 2012-08-18 NOTE — Telephone Encounter (Signed)
Patient would like to speak with nurse regarding medication give to him by back Dr., he would like to know if Voltaren is ok to take with his regular heart medication.

## 2012-08-19 NOTE — Telephone Encounter (Signed)
DISCUSSED WITH [facility]  RE QUESTION  PER [facility] THERE IS AN INCREASE  RISK WITH BLEEDING WITH TAKING PLAVIX AND VOLTAREN .  IF PT NEEDS TO TAKE  DO NOT TAKE  TOGETHER AND ALSO INSTRUCTED TO MONITOR   IF  NOTES ANY BLEEDING  WITH URINATION ,  NOSE BLEEDS  OR BM  TO STOP IMMEDIATELY . PRESCRIBING MD  NEW PT WAS ON PLAVIX PER PT .Zack Seal

## 2012-09-19 ENCOUNTER — Encounter: Payer: Self-pay | Admitting: *Deleted

## 2012-09-19 ENCOUNTER — Other Ambulatory Visit: Payer: Self-pay | Admitting: Cardiovascular Disease

## 2012-09-19 DIAGNOSIS — I10 Essential (primary) hypertension: Secondary | ICD-10-CM

## 2012-09-19 MED ORDER — ASPIRIN 325 MG PO TABS: 325.0000 mg | ORAL_TABLET | Freq: Every day | ORAL | Status: DC

## 2012-09-19 MED ORDER — CARVEDILOL 12.5 MG PO TABS: 12.5000 mg | ORAL_TABLET | Freq: Two times a day (BID) | ORAL | Status: DC

## 2012-09-19 MED ORDER — RAMIPRIL 5 MG PO CAPS: 5.0000 mg | ORAL_CAPSULE | Freq: Every day | ORAL | Status: DC

## 2012-09-19 MED ORDER — CLOPIDOGREL BISULFATE 75 MG PO TABS: 75.0000 mg | ORAL_TABLET | Freq: Every day | ORAL | Status: DC

## 2012-09-22 ENCOUNTER — Ambulatory Visit (INDEPENDENT_AMBULATORY_CARE_PROVIDER_SITE_OTHER): Payer: BC Managed Care – PPO | Admitting: Cardiovascular Disease

## 2012-09-22 ENCOUNTER — Encounter: Payer: Self-pay | Admitting: Cardiovascular Disease

## 2012-09-22 ENCOUNTER — Ambulatory Visit: Payer: BC Managed Care – PPO | Admitting: Nurse Practitioner

## 2012-09-22 VITALS — BP 173/98 | HR 87 | Ht 68.0 in | Wt 153.0 lb

## 2012-09-22 DIAGNOSIS — F17200 Nicotine dependence, unspecified, uncomplicated: Secondary | ICD-10-CM

## 2012-09-22 DIAGNOSIS — I10 Essential (primary) hypertension: Secondary | ICD-10-CM

## 2012-09-22 DIAGNOSIS — R9431 Abnormal electrocardiogram [ECG] [EKG]: Secondary | ICD-10-CM

## 2012-09-22 DIAGNOSIS — I25810 Atherosclerosis of coronary artery bypass graft(s) without angina pectoris: Secondary | ICD-10-CM

## 2012-09-22 MED ORDER — CLOPIDOGREL BISULFATE 75 MG PO TABS: 75.0000 mg | ORAL_TABLET | Freq: Every day | ORAL | Status: DC

## 2012-09-22 MED ORDER — ATORVASTATIN CALCIUM 20 MG PO TABS: 20.0000 mg | ORAL_TABLET | Freq: Every day | ORAL | Status: DC

## 2012-09-22 MED ORDER — CARVEDILOL 12.5 MG PO TABS: 12.5000 mg | ORAL_TABLET | Freq: Two times a day (BID) | ORAL | Status: DC

## 2012-09-22 MED ORDER — RAMIPRIL 5 MG PO CAPS: 5.0000 mg | ORAL_CAPSULE | Freq: Every day | ORAL | Status: DC

## 2012-09-22 MED ORDER — CLOPIDOGREL BISULFATE 75 MG PO TABS: 75.0000 mg | ORAL_TABLET | Freq: Every day | ORAL | Status: AC

## 2012-09-22 NOTE — Patient Instructions (Signed)
Your physician wants you to follow-up in:  6 MONTHS WITH DR Haywood Filler will receive a reminder letter in the mail two months in advance. If you don't receive a letter, please call our office to schedule the follow-up appointment. Your physician has recommended you make the following change in your medication: START  ATORVASTIN 20 MG EVERY DAY Your physician has requested that you have an echocardiogram. Echocardiography is a painless test that uses sound waves to create images of your heart. It provides your doctor with information about the size and shape of your heart and how well your heart's chambers and valves are working. This procedure takes approximately one hour. There are no restrictions for this procedure.  DX ABN EKG  A chest x-ray takes a picture of the organs and structures inside the chest, including the heart, lungs, and blood vessels. This test can show several things, including, whether the heart is enlarges; whether fluid is building up in the lungs; and whether pacemaker / defibrillator leads are still in place. DX  SMOKER AND WEIGHT LOSS   NEEDS TO DR  Valeria Batman FOR HAND
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
## 2012-09-22 NOTE — Assessment & Plan Note (Signed)
Need to resume ACE has not taken meds  NAS diet Hopefully will start seeing a doctor more regularly

## 2012-09-22 NOTE — Assessment & Plan Note (Signed)
No chest pain but ECG shows loss of R waves across precodium compared to 2011.  Will check echo for EF

## 2012-09-22 NOTE — Assessment & Plan Note (Signed)
Will get lab work from [facility] but needs to be on statin with history of CAD  Will call in generic lipitor
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
## 2012-09-22 NOTE — Assessment & Plan Note (Signed)
Counseled for less than 10 minutes with no recent CXR and weight loss Will get CXR

## 2012-09-23 ENCOUNTER — Telehealth: Payer: Self-pay | Admitting: *Deleted

## 2012-09-23 ENCOUNTER — Ambulatory Visit (HOSPITAL_COMMUNITY)
Admission: RE | Admit: 2012-09-23 | Discharge: 2012-09-23 | Disposition: A | Discharge status: Home / Self Care | Payer: BC Managed Care – PPO | Source: Ambulatory Visit | Attending: Cardiovascular Disease | Admitting: Cardiovascular Disease

## 2012-09-23 DIAGNOSIS — F17200 Nicotine dependence, unspecified, uncomplicated: Secondary | ICD-10-CM | POA: Insufficient documentation

## 2012-09-23 NOTE — Telephone Encounter (Signed)
APPT MADE WITH DR Mina Marble FOR 09-30-12 AT 3:10 PM  PER DR [name] PT AWARE./CY

## 2012-09-24 ENCOUNTER — Telehealth: Payer: Self-pay | Admitting: Cardiovascular Disease

## 2012-09-24 NOTE — Telephone Encounter (Signed)
New Problem: ° ° ° °Patient returned your call.  Please call back. °

## 2012-09-29 NOTE — Telephone Encounter (Signed)
F/u  Returning call back to nurse.  

## 2012-09-29 NOTE — Telephone Encounter (Signed)
PT  CALLED NEEDING  DR'S INFO   SEEING FOR  HIS HAND  DR Valeria Batman IS NAME PT AWARE .[name]

## 2012-09-29 NOTE — Telephone Encounter (Signed)
lmtcb ./cy 

## 2012-09-30 ENCOUNTER — Other Ambulatory Visit (HOSPITAL_COMMUNITY): Payer: BC Managed Care – PPO

## 2012-10-01 ENCOUNTER — Telehealth: Payer: Self-pay | Admitting: Cardiovascular Disease

## 2012-10-01 NOTE — Telephone Encounter (Signed)
Pt. states that he saw this office's phone number on his caller id and called number back. Pt is advised that call was not noted in his chart and that the person who tried to reach him would call back. He verbalized understanding.

## 2012-10-01 NOTE — Telephone Encounter (Signed)
Pt returning nurse call, he can be reached at [phone]

## 2012-10-03 ENCOUNTER — Ambulatory Visit (HOSPITAL_COMMUNITY): Payer: BC Managed Care – PPO | Attending: Cardiovascular Disease

## 2012-10-03 DIAGNOSIS — I2510 Atherosclerotic heart disease of native coronary artery without angina pectoris: Secondary | ICD-10-CM | POA: Insufficient documentation

## 2012-10-03 DIAGNOSIS — F17200 Nicotine dependence, unspecified, uncomplicated: Secondary | ICD-10-CM | POA: Insufficient documentation

## 2012-10-03 DIAGNOSIS — I079 Rheumatic tricuspid valve disease, unspecified: Secondary | ICD-10-CM | POA: Insufficient documentation

## 2012-10-03 DIAGNOSIS — R9431 Abnormal electrocardiogram [ECG] [EKG]: Secondary | ICD-10-CM | POA: Insufficient documentation

## 2012-10-03 DIAGNOSIS — I059 Rheumatic mitral valve disease, unspecified: Secondary | ICD-10-CM | POA: Insufficient documentation

## 2012-10-03 DIAGNOSIS — I10 Essential (primary) hypertension: Secondary | ICD-10-CM | POA: Insufficient documentation

## 2012-10-03 NOTE — Progress Notes (Signed)
Echocardiogram performed.  

## 2012-10-05 DIAGNOSIS — S62009S Unspecified fracture of navicular [scaphoid] bone of unspecified wrist, sequela: Secondary | ICD-10-CM

## 2012-10-05 DIAGNOSIS — M19139 Post-traumatic osteoarthritis, unspecified wrist: Secondary | ICD-10-CM

## 2012-10-05 HISTORY — DX: Post-traumatic osteoarthritis, unspecified wrist: M19.139

## 2012-10-05 HISTORY — DX: Unspecified fracture of navicular (scaphoid) bone of unspecified wrist, sequela: S62.009S

## 2012-10-10 ENCOUNTER — Other Ambulatory Visit: Payer: Self-pay | Admitting: Orthopedic Surgery

## 2012-10-13 ENCOUNTER — Encounter (HOSPITAL_BASED_OUTPATIENT_CLINIC_OR_DEPARTMENT_OTHER): Payer: Self-pay | Admitting: *Deleted

## 2012-10-13 ENCOUNTER — Telehealth: Payer: Self-pay | Admitting: Cardiovascular Disease

## 2012-10-13 NOTE — Pre-Procedure Instructions (Signed)
To come for BMET 

## 2012-10-13 NOTE — Telephone Encounter (Signed)
plz return call to [place]- [facility]  [phone] regarding, wrist surgery with Dr. Earl Gala on 10/15/12.  Pt needs cardiac clearance.

## 2012-10-13 NOTE — Pre-Procedure Instructions (Signed)
History discussed with Dr. Jacklynn Bue; pt. needs cardiac clearance; spoke with Meka at [facility]; she will have nurse call us.

## 2012-10-13 NOTE — Telephone Encounter (Signed)
WILL FORWARD TO DR NISHAN FOR  REVIEW./CY 

## 2012-10-13 NOTE — Telephone Encounter (Signed)
Ok for hand surgery.  But needs F/U with me to discuss CAD/EF worse than 2008 Needs cardiac MRI to quantitate EF and assess for scar Make sure he is taking his ACE

## 2012-10-14 ENCOUNTER — Encounter (HOSPITAL_BASED_OUTPATIENT_CLINIC_OR_DEPARTMENT_OTHER)
Admission: RE | Admit: 2012-10-14 | Discharge: 2012-10-14 | Disposition: A | Discharge status: Home / Self Care | Payer: BC Managed Care – PPO | Source: Ambulatory Visit | Attending: Orthopedic Surgery | Admitting: Orthopedic Surgery

## 2012-10-14 LAB — BASIC METABOLIC PANEL
BUN: 12 mg/dL (ref 6–23)
CO2: 29 mEq/L (ref 19–32)
Calcium: 9.6 mg/dL (ref 8.4–10.5)
Chloride: 100 mEq/L (ref 96–112)
Creatinine, Ser: 0.73 mg/dL (ref 0.50–1.35)
GFR calc Af Amer: >90 mL/min (ref >90)
GFR calc non Af Amer: >90 mL/min (ref >90)
Glucose, Bld: 109 mg/dL — ABNORMAL HIGH (ref 70–99)
Potassium: 4.3 mEq/L (ref 3.5–5.1)
Sodium: 138 mEq/L (ref 135–145)

## 2012-10-15 ENCOUNTER — Encounter (HOSPITAL_BASED_OUTPATIENT_CLINIC_OR_DEPARTMENT_OTHER)
Admission: RE | Disposition: A | Discharge status: Home / Self Care | Payer: Self-pay | Source: Ambulatory Visit | Attending: Orthopedic Surgery

## 2012-10-15 ENCOUNTER — Encounter (HOSPITAL_BASED_OUTPATIENT_CLINIC_OR_DEPARTMENT_OTHER): Payer: Self-pay | Admitting: Anesthesiology

## 2012-10-15 ENCOUNTER — Encounter (HOSPITAL_BASED_OUTPATIENT_CLINIC_OR_DEPARTMENT_OTHER): Payer: Self-pay | Admitting: *Deleted

## 2012-10-15 ENCOUNTER — Ambulatory Visit (HOSPITAL_BASED_OUTPATIENT_CLINIC_OR_DEPARTMENT_OTHER)
Admission: RE | Admit: 2012-10-15 | Discharge: 2012-10-15 | Disposition: A | Discharge status: Home / Self Care | Payer: BC Managed Care – PPO | Source: Ambulatory Visit | Attending: Orthopedic Surgery | Admitting: Orthopedic Surgery

## 2012-10-15 ENCOUNTER — Encounter (HOSPITAL_BASED_OUTPATIENT_CLINIC_OR_DEPARTMENT_OTHER): Payer: Self-pay | Admitting: Certified Registered"

## 2012-10-15 ENCOUNTER — Ambulatory Visit (HOSPITAL_BASED_OUTPATIENT_CLINIC_OR_DEPARTMENT_OTHER): Payer: BC Managed Care – PPO | Admitting: Anesthesiology

## 2012-10-15 DIAGNOSIS — J449 Chronic obstructive pulmonary disease, unspecified: Secondary | ICD-10-CM | POA: Insufficient documentation

## 2012-10-15 DIAGNOSIS — Z01812 Encounter for preprocedural laboratory examination: Secondary | ICD-10-CM | POA: Insufficient documentation

## 2012-10-15 DIAGNOSIS — S62009A Unspecified fracture of navicular [scaphoid] bone of unspecified wrist, initial encounter for closed fracture: Secondary | ICD-10-CM

## 2012-10-15 DIAGNOSIS — I10 Essential (primary) hypertension: Secondary | ICD-10-CM | POA: Insufficient documentation

## 2012-10-15 DIAGNOSIS — J4489 Other specified chronic obstructive pulmonary disease: Secondary | ICD-10-CM | POA: Insufficient documentation

## 2012-10-15 DIAGNOSIS — IMO0002 Reserved for concepts with insufficient information to code with codable children: Secondary | ICD-10-CM | POA: Insufficient documentation

## 2012-10-15 HISTORY — DX: Essential (primary) hypertension: I10

## 2012-10-15 HISTORY — PX: OPEN REDUCTION INTERNAL FIXATION (ORIF) SCAPHOID WITH ILIAC CREST BONE GRAFT: SHX5668

## 2012-10-15 HISTORY — DX: Sleep apnea, unspecified: G47.30

## 2012-10-15 SURGERY — OPEN REDUCTION INTERNAL FIXATION (ORIF) SCAPHOID WITH ILIAC CREST BONE GRAFT
Anesthesia: General | Site: Wrist | Laterality: Right | Wound class: Clean

## 2012-10-15 MED ORDER — EPHEDRINE SULFATE 50 MG/ML IJ SOLN
INTRAMUSCULAR | Status: DC | PRN
  Administered 2012-10-15 (×2): 10 mg via INTRAVENOUS

## 2012-10-15 MED ORDER — FENTANYL CITRATE 0.05 MG/ML IJ SOLN
50.0000 ug | INTRAMUSCULAR | Status: DC | PRN
  Administered 2012-10-15: 100 ug via INTRAVENOUS

## 2012-10-15 MED ORDER — LACTATED RINGERS IV SOLN
INTRAVENOUS | Status: DC
  Administered 2012-10-15: 12:00:00 via INTRAVENOUS

## 2012-10-15 MED ORDER — FENTANYL CITRATE 0.05 MG/ML IJ SOLN
INTRAMUSCULAR | Status: DC | PRN
  Administered 2012-10-15: 25 ug via INTRAVENOUS

## 2012-10-15 MED ORDER — OXYCODONE HCL 5 MG/5ML PO SOLN: 5.0000 mg | Freq: Once | ORAL | Status: DC | PRN

## 2012-10-15 MED ORDER — OXYCODONE HCL 5 MG PO TABS: 5.0000 mg | ORAL_TABLET | Freq: Once | ORAL | Status: DC | PRN

## 2012-10-15 MED ORDER — PROPOFOL 10 MG/ML IV BOLUS
INTRAVENOUS | Status: DC | PRN
  Administered 2012-10-15: 125 mg via INTRAVENOUS

## 2012-10-15 MED ORDER — MIDAZOLAM HCL 2 MG/2ML IJ SOLN: 0.5000 mg | Freq: Once | INTRAMUSCULAR | Status: DC | PRN

## 2012-10-15 MED ORDER — ONDANSETRON HCL 4 MG/2ML IJ SOLN
INTRAMUSCULAR | Status: DC | PRN
  Administered 2012-10-15: 4 mg via INTRAVENOUS

## 2012-10-15 MED ORDER — HYDROMORPHONE HCL PF 1 MG/ML IJ SOLN: 0.2500 mg | INTRAMUSCULAR | Status: DC | PRN

## 2012-10-15 MED ORDER — OXYCODONE-ACETAMINOPHEN 5-325 MG PO TABS: 1.0000 | ORAL_TABLET | ORAL | Status: DC | PRN

## 2012-10-15 MED ORDER — DEXAMETHASONE SODIUM PHOSPHATE 4 MG/ML IJ SOLN
INTRAMUSCULAR | Status: DC | PRN
  Administered 2012-10-15: 4 mg via INTRAVENOUS

## 2012-10-15 MED ORDER — CHLORHEXIDINE GLUCONATE 4 % EX LIQD: 60.0000 mL | Freq: Once | CUTANEOUS | Status: DC

## 2012-10-15 MED ORDER — PROMETHAZINE HCL 25 MG/ML IJ SOLN: 6.2500 mg | INTRAMUSCULAR | Status: DC | PRN

## 2012-10-15 MED ORDER — MEPERIDINE HCL 25 MG/ML IJ SOLN: 6.2500 mg | INTRAMUSCULAR | Status: DC | PRN

## 2012-10-15 MED ORDER — ROPIVACAINE HCL 5 MG/ML IJ SOLN
INTRAMUSCULAR | Status: DC | PRN
  Administered 2012-10-15: 150 mg via EPIDURAL

## 2012-10-15 MED ORDER — DEXAMETHASONE SODIUM PHOSPHATE 4 MG/ML IJ SOLN
INTRAMUSCULAR | Status: DC | PRN
  Administered 2012-10-15: 10 mg

## 2012-10-15 MED ORDER — MIDAZOLAM HCL 2 MG/2ML IJ SOLN
1.0000 mg | INTRAMUSCULAR | Status: DC | PRN
  Administered 2012-10-15: 2 mg via INTRAVENOUS

## 2012-10-15 MED ORDER — CEFAZOLIN SODIUM 1-5 GM-% IV SOLN
1.0000 g | Freq: Once | INTRAVENOUS | Status: AC
  Administered 2012-10-15: 2 g via INTRAVENOUS

## 2012-10-15 SURGICAL SUPPLY — 78 items
APL SKNCLS STERI-STRIP NONHPOA (GAUZE/BANDAGES/DRESSINGS)
BAG DECANTER FOR FLEXI CONT (MISCELLANEOUS) IMPLANT
BANDAGE ELASTIC 3 VELCRO ST LF (GAUZE/BANDAGES/DRESSINGS) IMPLANT
BANDAGE ELASTIC 4 VELCRO ST LF (GAUZE/BANDAGES/DRESSINGS) ×2 IMPLANT
BANDAGE GAUZE ELAST BULKY 4 IN (GAUZE/BANDAGES/DRESSINGS) ×2 IMPLANT
BENZOIN TINCTURE PRP APPL 2/3 (GAUZE/BANDAGES/DRESSINGS) IMPLANT
BIT DRILL ACUTRAK 2 MINI (BIT) ×1 IMPLANT
BLADE MINI RND TIP GREEN BEAV (BLADE) IMPLANT
BLADE SURG 15 STRL LF DISP TIS (BLADE) ×1 IMPLANT
BLADE SURG 15 STRL SS (BLADE) ×2
BNDG CMPR 9X4 STRL LF SNTH (GAUZE/BANDAGES/DRESSINGS) ×1
BNDG ESMARK 4X9 LF (GAUZE/BANDAGES/DRESSINGS) ×2 IMPLANT
CANISTER SUCTION 1200CC (MISCELLANEOUS) IMPLANT
CLOTH BEACON ORANGE TIMEOUT ST (SAFETY) ×2 IMPLANT
CORDS BIPOLAR (ELECTRODE) ×2 IMPLANT
COVER TABLE BACK 60X90 (DRAPES) ×2 IMPLANT
CUFF TOURNIQUET SINGLE 18IN (TOURNIQUET CUFF) ×2 IMPLANT
DECANTER SPIKE VIAL GLASS SM (MISCELLANEOUS) IMPLANT
DRAPE EXTREMITY T 121X128X90 (DRAPE) ×2 IMPLANT
DRAPE INCISE IOBAN 66X45 STRL (DRAPES) ×2 IMPLANT
DRAPE OEC MINIVIEW 54X84 (DRAPES) ×2 IMPLANT
DRAPE PED LAPAROTOMY (DRAPES) ×2 IMPLANT
DRAPE SURG 17X23 STRL (DRAPES) ×12 IMPLANT
DRILL ACUTRAK 2 MINI (BIT) ×2
DURAPREP 26ML APPLICATOR (WOUND CARE) ×2 IMPLANT
ELECT REM PT RETURN 9FT ADLT (ELECTROSURGICAL)
ELECTRODE REM PT RTRN 9FT ADLT (ELECTROSURGICAL) IMPLANT
GAUZE SPONGE 4X4 16PLY XRAY LF (GAUZE/BANDAGES/DRESSINGS) IMPLANT
GAUZE XEROFORM 1X8 LF (GAUZE/BANDAGES/DRESSINGS) IMPLANT
GLOVE BIO SURGEON STRL SZ 6.5 (GLOVE) ×6 IMPLANT
GLOVE BIO SURGEON STRL SZ8.5 (GLOVE) ×2 IMPLANT
GLOVE BIOGEL PI IND STRL 6.5 (GLOVE) ×2 IMPLANT
GLOVE BIOGEL PI IND STRL 7.0 (GLOVE) ×2 IMPLANT
GLOVE BIOGEL PI INDICATOR 6.5 (GLOVE) ×2
GLOVE BIOGEL PI INDICATOR 7.0 (GLOVE) ×2
GLOVE INDICATOR 7.0 STRL GRN (GLOVE) ×4 IMPLANT
GOWN PREVENTION PLUS XLARGE (GOWN DISPOSABLE) ×4 IMPLANT
GOWN PREVENTION PLUS XXLARGE (GOWN DISPOSABLE) ×4 IMPLANT
GUIDEWIRE ORTHO MINI ACTK .045 (WIRE) ×4 IMPLANT
KWIRE 4.0 X .045IN (WIRE) ×2 IMPLANT
NEEDLE HYPO 25X1 1.5 SAFETY (NEEDLE) ×2 IMPLANT
NS IRRIG 1000ML POUR BTL (IV SOLUTION) ×2 IMPLANT
PACK BASIN DAY SURGERY FS (CUSTOM PROCEDURE TRAY) ×2 IMPLANT
PAD CAST 3X4 CTTN HI CHSV (CAST SUPPLIES) ×1 IMPLANT
PAD CAST 4YDX4 CTTN HI CHSV (CAST SUPPLIES) IMPLANT
PADDING CAST ABS 4INX4YD NS (CAST SUPPLIES) ×1
PADDING CAST ABS COTTON 4X4 ST (CAST SUPPLIES) ×1 IMPLANT
PADDING CAST COTTON 3X4 STRL (CAST SUPPLIES) ×2
PADDING CAST COTTON 4X4 STRL (CAST SUPPLIES)
PENCIL BUTTON HOLSTER BLD 10FT (ELECTRODE) IMPLANT
SCREW ACUTRAK 2 MINI 22MM (Screw) ×2 IMPLANT
SHEET MEDIUM DRAPE 40X70 STRL (DRAPES) ×2 IMPLANT
SLEEVE SCD COMPRESS KNEE MED (MISCELLANEOUS) ×2 IMPLANT
SPLINT PLASTER CAST XFAST 3X15 (CAST SUPPLIES) IMPLANT
SPLINT PLASTER CAST XFAST 4X15 (CAST SUPPLIES) ×5 IMPLANT
SPLINT PLASTER XTRA FAST SET 4 (CAST SUPPLIES) ×5
SPLINT PLASTER XTRA FASTSET 3X (CAST SUPPLIES)
SPONGE GAUZE 4X4 12PLY (GAUZE/BANDAGES/DRESSINGS) ×2 IMPLANT
STOCKINETTE 4X48 STRL (DRAPES) ×2 IMPLANT
STRIP CLOSURE SKIN 1/2X4 (GAUZE/BANDAGES/DRESSINGS) IMPLANT
SUCTION FRAZIER TIP 10 FR DISP (SUCTIONS) IMPLANT
SUT ETHILON 4 0 PS 2 18 (SUTURE) IMPLANT
SUT MERSILENE 4 0 P 3 (SUTURE) IMPLANT
SUT MNCRL AB 4-0 PS2 18 (SUTURE) ×2 IMPLANT
SUT PROLENE 3 0 PS 2 (SUTURE) IMPLANT
SUT SILK 2 0 FS (SUTURE) IMPLANT
SUT VIC AB 0 CT1 27 (SUTURE) ×2
SUT VIC AB 0 CT1 27XBRD ANBCTR (SUTURE) ×1 IMPLANT
SUT VIC AB 2-0 SH 27 (SUTURE) ×2
SUT VIC AB 2-0 SH 27XBRD (SUTURE) ×1 IMPLANT
SUT VIC AB 3-0 FS2 27 (SUTURE) IMPLANT
SUT VICRYL RAPIDE 4/0 PS 2 (SUTURE) IMPLANT
SYR BULB 3OZ (MISCELLANEOUS) ×2 IMPLANT
SYRINGE 10CC LL (SYRINGE) IMPLANT
TOWEL OR 17X24 6PK STRL BLUE (TOWEL DISPOSABLE) ×2 IMPLANT
TUBE CONNECTING 20X1/4 (TUBING) IMPLANT
UNDERPAD 30X30 INCONTINENT (UNDERPADS AND DIAPERS) ×2 IMPLANT
WATER STERILE IRR 1000ML POUR (IV SOLUTION) IMPLANT

## 2012-10-15 NOTE — Transfer of Care (Signed)
Immediate Anesthesia Transfer of Care Note  Patient: Fabio Neighbors  Procedure(s) Performed: Procedure(s) (LRB) with comments: OPEN REDUCTION INTERNAL FIXATION (ORIF) SCAPHOID WITH ILIAC CREST BONE GRAFT (Right) - No iliac creast bone graft taken, used graft from radius  Patient Location: PACU  Anesthesia Type:GA combined with regional for post-op pain  Level of Consciousness: awake, alert , oriented and patient cooperative  Airway & Oxygen Therapy: Patient Spontanous Breathing and Patient connected to face mask oxygen  Post-op Assessment: Report given to PACU RN and Post -op Vital signs reviewed and stable  Post vital signs: Reviewed and stable  Complications: No apparent anesthesia complications
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
## 2012-10-15 NOTE — Progress Notes (Signed)
Assisted Dr. Krista Blue with right, supraclavicular block. Side rails up, monitors on throughout procedure. See vital signs in flow sheet. Tolerated Procedure well.

## 2012-10-15 NOTE — Anesthesia Procedure Notes (Addendum)
Anesthesia Regional Block:  Supraclavicular block  Pre-Anesthetic Checklist: ,, timeout performed, Correct Patient, Correct Site, Correct Laterality, Correct Procedure, Correct Position, site marked, Risks and benefits discussed,  Surgical consent,  Pre-op evaluation,  At surgeon's request and post-op pain management  Laterality: Right  Prep: chloraprep       Needles:  Injection technique: Single-shot  Needle Type: Echogenic Stimulator Needle     Needle Length: 5cm 5 cm Needle Gauge: 22 and 22 G    Additional Needles:  Procedures: ultrasound guided (picture in chart) and nerve stimulator Supraclavicular block  Nerve Stimulator or Paresthesia:  Response: bicep contraction, 0.45 mA,   Additional Responses:   Narrative:  Start time: 10/15/2012 12:45 PM End time: 10/15/2012 12:55 PM Injection made incrementally with aspirations every 5 mL.  Performed by: Personally  Anesthesiologist: J. Adonis Huguenin, MD  Additional Notes: Functioning IV was confirmed and monitors applied.  A 50mm 22ga echogenic arrow stimulator was used. Sterile prep and drape,hand hygiene and sterile gloves were used.Ultrasound guidance: relevant anatomy identified, needle position confirmed, local anesthetic spread visualized around nerve(s)., vascular puncture avoided.  Image printed for medical record.  Negative aspiration and negative test dose prior to incremental administration of local anesthetic. The patient tolerated the procedure well.  Interscalene brachial plexus block Procedure Name: LMA Insertion Date/Time: 10/15/2012 1:07 PM Performed by: Verlan Friends Pre-anesthesia Checklist: Patient identified, Emergency Drugs available, Suction available, Patient being monitored and Timeout performed Patient Re-evaluated:Patient Re-evaluated prior to inductionOxygen Delivery Method: Circle System Utilized Preoxygenation: Pre-oxygenation with 100% oxygen Intubation Type: IV induction Ventilation: Mask  ventilation without difficulty LMA: LMA inserted LMA Size: 4.0 Number of attempts: 1 Airway Equipment and Method: bite block Placement Confirmation: positive ETCO2 Tube secured with: Tape Dental Injury: Teeth and Oropharynx as per pre-operative assessment

## 2012-10-15 NOTE — Anesthesia Postprocedure Evaluation (Signed)
  Anesthesia Post-op Note  Patient: [name]  Procedure(s) Performed: Procedure(s) (LRB) with comments: OPEN REDUCTION INTERNAL FIXATION (ORIF) SCAPHOID WITH ILIAC CREST BONE GRAFT (Right) - No iliac creast bone graft taken, used graft from radius  Patient Location: PACU  Anesthesia Type:GA combined with regional for post-op pain  Level of Consciousness: awake, alert , oriented and patient cooperative  Airway and Oxygen Therapy: Patient Spontanous Breathing  Post-op Pain: none  Post-op Assessment: Post-op Vital signs reviewed, Patient's Cardiovascular Status Stable, Respiratory Function Stable, Patent Airway, No signs of Nausea or vomiting and Pain level controlled  Post-op Vital Signs: Reviewed and stable  Complications: No apparent anesthesia complications

## 2012-10-15 NOTE — Brief Op Note (Signed)
10/15/2012  2:23 PM  PATIENT:  [name]  56 y.o. male  PRE-OPERATIVE DIAGNOSIS:  non union right scaphoid   POST-OPERATIVE DIAGNOSIS:  non union right scaphoid   PROCEDURE:  Procedure(s) (LRB) with comments: OPEN REDUCTION INTERNAL FIXATION (ORIF) SCAPHOID WITH ILIAC CREST BONE GRAFT (Right) - No iliac creast bone graft taken, used graft from radius  SURGEON:  Surgeon(s) and Role:    * Marlowe Shores, MD - Primary  PHYSICIAN ASSISTANT:   ASSISTANTS: Cindee Salt  ANESTHESIA:   general  EBL:  Total I/O In: 1400 [I.V.:1400] Out: -   BLOOD ADMINISTERED:none  DRAINS: none   LOCAL MEDICATIONS USED:  NONE  SPECIMEN:  No Specimen  DISPOSITION OF SPECIMEN:  N/A  COUNTS:  YES  TOURNIQUET:   Total Tourniquet Time Documented: Upper Arm (Right) - 67 minutes  DICTATION: .Other Dictation: Dictation Number [id number]  PLAN OF CARE: Discharge to home after PACU  PATIENT DISPOSITION:  PACU - hemodynamically stable.   Delay start of Pharmacological VTE agent (>24hrs) due to surgical blood loss or risk of bleeding: not applicable

## 2012-10-15 NOTE — Anesthesia Preprocedure Evaluation (Signed)
Anesthesia Evaluation  Patient identified by MRN, date of birth, ID band Patient awake    Reviewed: Allergy & Precautions, H&P , NPO status , Patient's Chart, lab work & pertinent test results, reviewed documented beta blocker date and time   History of Anesthesia Complications Negative for: history of anesthetic complications  Airway Mallampati: II TM Distance: >3 FB Neck ROM: Full    Dental  (+) Dental Advisory Given and Caps   Pulmonary shortness of breath, neg sleep apnea, COPDCurrent Smoker,  breath sounds clear to auscultation  Pulmonary exam normal       Cardiovascular hypertension, + CAD, + Past MI and + Cardiac Stents (last plavix yesterday ) Rhythm:Regular Rate:Normal  11/13 ECHO: EF 30-35%, valves OK   Neuro/Psych negative neurological ROS     GI/Hepatic Neg liver ROS, GERD-  Medicated and Controlled,  Endo/Other  negative endocrine ROS  Renal/GU negative Renal ROS     Musculoskeletal   Abdominal   Peds  Hematology   Anesthesia Other Findings   Reproductive/Obstetrics                           Anesthesia Physical Anesthesia Plan  ASA: III  Anesthesia Plan: General   Post-op Pain Management:    Induction: Intravenous  Airway Management Planned: LMA  Additional Equipment:   Intra-op Plan:   Post-operative Plan:   Informed Consent: I have reviewed the patients History and Physical, chart, labs and discussed the procedure including the risks, benefits and alternatives for the proposed anesthesia with the patient or authorized representative who has indicated his/her understanding and acceptance.   Dental advisory given  Plan Discussed with: Surgeon and CRNA  Anesthesia Plan Comments: (Plan routine monitors, GA- LMA OK, supraclavicular block for post op analgesia)        Anesthesia Quick Evaluation

## 2012-10-15 NOTE — Op Note (Signed)
See note [phone]

## 2012-10-16 ENCOUNTER — Encounter (HOSPITAL_BASED_OUTPATIENT_CLINIC_OR_DEPARTMENT_OTHER): Payer: Self-pay | Admitting: Orthopedic Surgery

## 2012-10-16 LAB — POCT HEMOGLOBIN-HEMACUE: Hemoglobin: 17.2 g/dL — ABNORMAL HIGH (ref 13.0–17.0)

## 2012-10-17 NOTE — Op Note (Signed)
[name], [name]              ACCOUNT NO.:  [id number]  MEDICAL RECORD NO.:  [id number]  LOCATION:                                 FACILITY:  PHYSICIAN:  Artist Pais. Kimra Kantor, M.D.DATE OF BIRTH:  09/20/1956  DATE OF PROCEDURE:  10/15/2012 DATE OF DISCHARGE:                              OPERATIVE REPORT   PREOPERATIVE DIAGNOSIS:  Scaphoid nonunion of left wrist.  POSTOPERATIVE DIAGNOSIS:  Scaphoid nonunion of left wrist.  PROCEDURE:  Open reduction and internal fixation above using 20 mm mini Acutrak screw with distal radius bone graft.  SURGEON: 1. Artist Pais Mina Marble, M.D. 2. Cindee Salt, M.D.  ANESTHESIA:  Axillary block and general.  COMPLICATIONS:  No complication.  DRAINS:  No drains.  DESCRIPTION OF PROCEDURE:  The patient was taken to the operating suite. After induction of adequate general anesthesia and axillary block analgesia, the right upper extremity was prepped and draped in sterile fashion.  An Esmarch was used to exsanguinate the limb.  Tourniquet was inflated to 250 mmHg.  At this point in time, incision was made in the palmar aspect of the right hand and wrist.  Skin was incised parallel to the FCR tendon and then obliquely up the thumb metacarpal area.  Skin was incised sharply.  Flaps were raised.  The FCR was identified.  The sheath overlying the FCR was incised.  Dissection was carried down to the volar capsule.  The radiocarpal joint was identified.  The scaphoid was identified and the nonunion site was exposed.  Once this was done, fluoroscopy was used to aid in placement of mini Acutrak guide pin across the nonunion site from distal to proximal.  Once this was done, nonunion site was then without curette and debrided down to normal looking bone.  After this was done, dissection was carried proximally over the pronator quadratus, which was split.  A small cortical window was made in the volar distal radius, and to harvest cancellous bone, which  was done without difficulty.  The cortical window was placed back. A cancellous bone was then packed into the nonunion site followed by placement of 20 mm mini Acutrak screw.  Intraoperative fluoroscopy revealed adequate reduction in AP, lateral, oblique view.  The guidewire was removed.  Wound was irrigated and loosely closed in layers of 3-0 undyed Vicryl and a 4-0 Vicryl Rapide subcuticular stitch on the skin. Steri-Strips, 4x4s, fluffs, and a compressive dressing was applied as well the radial gutter splint.  The patient tolerated the procedure well and went to the recovery room in stable fashion.     Artist Pais Mina Marble, M.D.     MAW/MEDQ  D:  10/15/2012  T:  10/16/2012  Job:  [id number]

## 2012-11-11 NOTE — Telephone Encounter (Signed)
lmtcb ./cy 

## 2012-11-18 NOTE — Telephone Encounter (Signed)
PT AWARE  APPT MADE WITH DR [name] FOR  11-28-12 AT 4:00 PM  TO DISCUSS  CAD AND EF .[name]

## 2012-11-28 ENCOUNTER — Ambulatory Visit: Payer: BC Managed Care – PPO | Admitting: Cardiovascular Disease

## 2012-12-11 ENCOUNTER — Ambulatory Visit (INDEPENDENT_AMBULATORY_CARE_PROVIDER_SITE_OTHER): Payer: BC Managed Care – PPO | Admitting: Cardiovascular Disease

## 2012-12-11 ENCOUNTER — Encounter: Payer: Self-pay | Admitting: Cardiovascular Disease

## 2012-12-11 VITALS — BP 176/104 | HR 78 | Ht 68.0 in | Wt 153.8 lb

## 2012-12-11 DIAGNOSIS — E782 Mixed hyperlipidemia: Secondary | ICD-10-CM

## 2012-12-11 DIAGNOSIS — I2510 Atherosclerotic heart disease of native coronary artery without angina pectoris: Secondary | ICD-10-CM

## 2012-12-11 DIAGNOSIS — I10 Essential (primary) hypertension: Secondary | ICD-10-CM

## 2012-12-11 MED ORDER — CARVEDILOL 12.5 MG PO TABS: 12.5000 mg | ORAL_TABLET | Freq: Two times a day (BID) | ORAL | Status: DC

## 2012-12-11 MED ORDER — RAMIPRIL 10 MG PO CAPS: 10.0000 mg | ORAL_CAPSULE | Freq: Every day | ORAL | Status: DC

## 2012-12-11 NOTE — Assessment & Plan Note (Signed)
Stable with no angina and good activity level.  Continue medical Rx  

## 2012-12-11 NOTE — Patient Instructions (Addendum)
Your physician recommends that you schedule a follow-up appointment in:  8 WEEKS WITH DR [facility] Your physician has recommended you make the following change in your medication:  INCREASE  RAMIPRIL TO   10 MG 1 EVERY DAY  .
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
## 2012-12-11 NOTE — Assessment & Plan Note (Signed)
Increase vasotec to 10 mg take coreg F/U 8 weeks

## 2012-12-11 NOTE — Assessment & Plan Note (Signed)
Cholesterol is at goal.  Continue current dose of statin and diet Rx.  No myalgias or side effects.  F/U  LFT's in 6 months. No results found for this basename: LDLCALC             

## 2012-12-23 ENCOUNTER — Telehealth: Payer: Self-pay | Admitting: Cardiovascular Disease

## 2012-12-23 DIAGNOSIS — I10 Essential (primary) hypertension: Secondary | ICD-10-CM

## 2012-12-23 NOTE — Telephone Encounter (Signed)
New Problem:    Patient called in because his BP is higher while taking atorvastatin (LIPITOR) 20 MG tablet than it was before he started taking it.  Please call back.

## 2012-12-24 NOTE — Telephone Encounter (Signed)
Follow-up:    Patient called in to follow-up on his previous call form yesterday.  Please call back.

## 2012-12-26 MED ORDER — RAMIPRIL 10 MG PO CAPS: 10.0000 mg | ORAL_CAPSULE | Freq: Two times a day (BID) | ORAL | Status: DC

## 2012-12-26 NOTE — Telephone Encounter (Signed)
SPOKE WITH PT RE B/P  HAD  PHYSICAL ON MON AND B/P WAS 197/105  HAS CHECKED B/P AND  PER PT LOWEST READING HAS BEEN 165/95 WITH HEART  RATE RUNNING  IN THE 70'S PER PT IS TAKING RAMIPRIL 10 MG QD AND CARVEDILOL 12.5 BID WILL FORWARD TO  DR [name] FOR REVIEW .Zack Seal

## 2012-12-26 NOTE — Telephone Encounter (Signed)
PT AWARE  APPT MADE  WITH  SCOTT WEAVER [facility] FOR 2.27.14 AT 3:20 PM./CY

## 2012-12-26 NOTE — Telephone Encounter (Signed)
Take ramipril bid and f/u with PA in next week or two

## 2013-01-01 ENCOUNTER — Ambulatory Visit (INDEPENDENT_AMBULATORY_CARE_PROVIDER_SITE_OTHER): Payer: BC Managed Care – PPO | Admitting: Physician Assistant

## 2013-01-01 ENCOUNTER — Encounter: Payer: Self-pay | Admitting: Physician Assistant

## 2013-01-01 VITALS — BP 162/90 | HR 68 | Ht 68.0 in | Wt 154.8 lb

## 2013-01-01 DIAGNOSIS — I2510 Atherosclerotic heart disease of native coronary artery without angina pectoris: Secondary | ICD-10-CM

## 2013-01-01 DIAGNOSIS — I2589 Other forms of chronic ischemic heart disease: Secondary | ICD-10-CM

## 2013-01-01 DIAGNOSIS — I10 Essential (primary) hypertension: Secondary | ICD-10-CM

## 2013-01-01 MED ORDER — CARVEDILOL 25 MG PO TABS: 25.0000 mg | ORAL_TABLET | Freq: Two times a day (BID) | ORAL | Status: DC

## 2013-01-01 NOTE — Progress Notes (Signed)
[address] [place], [place]  [address] Phone: [phone], Fax:  [phone]  Date:  01/01/2013   ID:  Pacey, Altizer 1956/07/11, MRN [id number]  PCP:  Charlton Haws, MD  Primary Cardiologist:  Dr. Charlton Haws     History of Present Illness: [name] is a 57 y.o. male who returns for evaluation of blood pressure.  He has a hx of CAD, s/p stent to RCA and LAD, ICM, HTN, HL, OSA, GERD, tobacco abuse.  Last LHC 12/2006:  LM 20%, pLAD 40%, mLAD stent ok, apical LAD 90% (1.5-56mm vessel), oD1 40-50%, AV groove CFX 30%, oOM1 30%, pRCA 30% (multiple), mRCA stent ok with 30-40% ISR, dRCA 30%, dAnt, inf-apical severe HK, EF 40%.  Myoview 02/2010:  EF 41%, septal, apical and inf infarct, no ischemia.  Echo 09/2012:  Mod LVH, focal basal hypertrophy, EF 30-35%, mid to dist inf-lat HK, Gr 1 diast dysfn, mild to mod LAE.  Last seen by Dr. Charlton Haws earlier this month.  BP was elevated and his ramipril was increased. Has continued to have elevated BPs.  Ramipril was increased to 10 mg bid.  Denies chest pain, dyspnea, syncope, orthopnea, PND, edema, weakness, speech difficulty, facial droop.  Does note rare headaches.   Labs (12/13):  K 4.3, creatinine 0.73, ALT 18, Hgb 17.2   Wt Readings from Last 3 Encounters:  01/01/13 154 lb 12.8 oz (70.217 kg)  12/11/12 153 lb 12.8 oz (69.763 kg)  10/13/12 160 lb (72.576 kg)     Past Medical History  Diagnosis Date  . CAD (coronary artery disease)     a. s/p prior MI; hx of stent to RCA and LAD;  b. LHC 12/2006:  LM 20%, pLAD 40%, mLAD stent ok, apical LAD 90% (1.5-21mm vessel), oD1 40-50%, AV groove CFX 30%, oOM1 30%, pRCA 30% (multiple), mRCA stent ok with 30-40% ISR, dRCA 30%, dAnt, inf-apical severe HK, EF 40% => med Rx.;    c. Myoview 02/2010:  EF 41%, septal, apical and inf infarct, no ischemia.  . Mixed hyperlipidemia   . GERD (gastroesophageal reflux disease)   . Hypertension     under control with med., has been  on med. > 12 yr.  . 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 Sleep apnea     uses CPAP nightly  . Dental bridge present     lower  . Ischemic cardiomyopathy     a. Echo 09/2012:  Mod LVH, focal basal hypertrophy, EF 30-35%, mid to dist inf-lat HK, Gr 1 diast dysfn, mild to mod LAE    Current Outpatient Prescriptions  Medication Sig Dispense Refill  . aspirin 325 MG tablet Take 1 tablet (325 mg total) by mouth daily.  10 tablet  0  . atorvastatin (LIPITOR) 20 MG tablet Take 1 tablet (20 mg total) by mouth daily.  90 tablet  3  . carvedilol (COREG) 12.5 MG tablet Take 1 tablet (12.5 mg total) by mouth 2 (two) times daily with a meal.  180 tablet  3  . clopidogrel (PLAVIX) 75 MG tablet Take 1 tablet (75 mg total) by mouth daily.  90 tablet  4  . ramipril (ALTACE) 10 MG capsule Take 1 capsule (10 mg total) by mouth 2 (two) times daily.  60 capsule  11   No current facility-administered medications for this visit.    Allergies:   No Known Allergies  Social History:  The patient  reports that  he has been smoking Cigarettes.  He has a 20 pack-year smoking history. He has never used smokeless tobacco. He reports that  drinks alcohol. He reports that he does not use illicit drugs.   ROS:  Please see the history of present illness.    All other systems reviewed and negative.   PHYSICAL EXAM: VS:  BP 162/90  Pulse 68  Ht 5\' 8"  (1.727 m)  Wt 154 lb 12.8 oz (70.217 kg)  BMI 23.54 kg/m2 Well nourished, well developed, in no acute distress HEENT: normal Neck: no JVD Vascular:  No carotid bruits Cardiac:  normal S1, S2; RRR; no murmur Lungs:  clear to auscultation bilaterally, no wheezing, rhonchi or rales Abd: soft, nontender, no hepatomegaly Ext: no edema Skin: warm and dry Neuro:  CNs 2-12 intact, no focal abnormalities noted  EKG:  NSR, HR 68, normal axis, LVH with repol abnormality, no change from prior tracing.     ASSESSMENT AND PLAN:  1. Hypertension:  Better  controlled.  Will increase Coreg to 25 mg bid.  Consider adding Spironolactone if BP remains uncontrolled in light of his ICM.  Check BMET today. 2. Coronary Artery Disease:  No angina.  Continue ASA and statin. 3. Ischemic Cardiomyopathy:  As noted, adjust carvedilol.  Consider adding Spironolactone.   4. Hyperlipidemia:  Continue statin. 5. Disposition:  Follow up with me in 2 weeks.   Signed, Tereso Newcomer, PA-C  3:37 PM 01/01/2013

## 2013-01-01 NOTE — Patient Instructions (Addendum)
Your physician has recommended you make the following change in your medication:   Take two 12.5 mg tablets by mouth twice a day until you receive your new prescription for 25 mg.  Your physician recommends that you have lab work today: BMET  Your physician recommends that you schedule a follow-up appointment in: 01/14/2013 @ 3:40 PM WITH [employer], PAC.  PLEASE KEEP APPOINTMENT ON 04/01 WITH DR. [name].

## 2013-01-02 LAB — BASIC METABOLIC PANEL
BUN: 13 mg/dL (ref 6–23)
CO2: 28 mEq/L (ref 19–32)
Calcium: 9.5 mg/dL (ref 8.4–10.5)
Chloride: 106 mEq/L (ref 96–112)
Creatinine, Ser: 0.7 mg/dL (ref 0.4–1.5)
GFR: 119.65 mL/min (ref >60.00)
Glucose, Bld: 73 mg/dL (ref 70–99)
Potassium: 4.1 mEq/L (ref 3.5–5.1)
Sodium: 140 mEq/L (ref 135–145)

## 2013-01-14 ENCOUNTER — Ambulatory Visit: Payer: BC Managed Care – PPO | Admitting: Physician Assistant

## 2013-02-03 ENCOUNTER — Ambulatory Visit: Payer: BC Managed Care – PPO | Admitting: Cardiovascular Disease

## 2013-09-23 ENCOUNTER — Telehealth: Payer: Self-pay | Admitting: *Deleted

## 2013-09-23 MED ORDER — NITROGLYCERIN 0.4 MG SL SUBL: 0.4000 mg | SUBLINGUAL_TABLET | SUBLINGUAL | Status: DC | PRN

## 2013-09-23 NOTE — Telephone Encounter (Signed)
Patient called requesting a nitro refill to be sent to [facility] stating that his were expired. I do not see where he was given a rx for this. Please advise. Thanks, MI

## 2013-09-23 NOTE — Telephone Encounter (Signed)
REFILL  FOR NTG    DONE  PER PT'S  REQUEST./CY

## 2013-10-30 ENCOUNTER — Other Ambulatory Visit: Payer: Self-pay | Admitting: *Deleted

## 2013-10-30 ENCOUNTER — Telehealth: Payer: Self-pay | Admitting: *Deleted

## 2013-10-30 MED ORDER — CLOPIDOGREL BISULFATE 75 MG PO TABS: 75.0000 mg | ORAL_TABLET | Freq: Every day | ORAL | Status: DC

## 2013-10-30 NOTE — Telephone Encounter (Signed)
Per Dr. Jens Som, send a refill of 30 with 2 refills until he comes for appointment. Also told the pt to make an appointment ASAP.

## 2013-10-30 NOTE — Telephone Encounter (Signed)
Wants you to call him please. Has questions for you and didn't elaborate what they were. thanks

## 2013-11-02 ENCOUNTER — Telehealth: Payer: Self-pay | Admitting: *Deleted

## 2013-11-02 NOTE — Telephone Encounter (Signed)
ERROR

## 2013-11-02 NOTE — Telephone Encounter (Signed)
APPT  MADE   TO SEE DR [name]  ON  11-17-13 .Zack Seal

## 2013-11-05 DIAGNOSIS — I219 Acute myocardial infarction, unspecified: Secondary | ICD-10-CM

## 2013-11-05 HISTORY — DX: Acute myocardial infarction, unspecified: I21.9

## 2013-11-10 ENCOUNTER — Ambulatory Visit: Payer: BC Managed Care – PPO | Admitting: Cardiovascular Disease

## 2013-11-17 ENCOUNTER — Encounter: Payer: Self-pay | Admitting: Cardiovascular Disease

## 2013-11-17 ENCOUNTER — Ambulatory Visit (INDEPENDENT_AMBULATORY_CARE_PROVIDER_SITE_OTHER): Payer: BC Managed Care – PPO | Admitting: Cardiovascular Disease

## 2013-11-17 VITALS — BP 177/94 | HR 79 | Ht 68.0 in | Wt 146.0 lb

## 2013-11-17 DIAGNOSIS — I2589 Other forms of chronic ischemic heart disease: Secondary | ICD-10-CM

## 2013-11-17 DIAGNOSIS — R079 Chest pain, unspecified: Secondary | ICD-10-CM

## 2013-11-17 DIAGNOSIS — Z79899 Other long term (current) drug therapy: Secondary | ICD-10-CM

## 2013-11-17 DIAGNOSIS — I10 Essential (primary) hypertension: Secondary | ICD-10-CM

## 2013-11-17 DIAGNOSIS — E785 Hyperlipidemia, unspecified: Secondary | ICD-10-CM

## 2013-11-17 DIAGNOSIS — Z0000 Encounter for general adult medical examination without abnormal findings: Secondary | ICD-10-CM

## 2013-11-17 MED ORDER — CLOPIDOGREL BISULFATE 75 MG PO TABS: 75.0000 mg | ORAL_TABLET | Freq: Every day | ORAL | Status: DC

## 2013-11-17 MED ORDER — ATORVASTATIN CALCIUM 20 MG PO TABS: 20.0000 mg | ORAL_TABLET | Freq: Every day | ORAL | Status: DC

## 2013-11-17 MED ORDER — LOSARTAN POTASSIUM-HCTZ 100-25 MG PO TABS: 1.0000 | ORAL_TABLET | Freq: Every day | ORAL | Status: DC

## 2013-11-17 NOTE — Assessment & Plan Note (Signed)
Some chest pain with continued smoking known ischemic DCM with stents LAD and RCA  F/u lexiscan myovue He does not think he can walk on treadmill

## 2013-11-17 NOTE — Patient Instructions (Addendum)
Your physician wants you to follow-up in:   [place] will receive a reminder letter in the mail two months in advance. If you don't receive a letter, please call our office to schedule the follow-up appointment. Your physician has recommended you make the following change in your medication: STOP  RAMIPRIL  AND START  LOSARTAN  100/25 MG  EVERY DAY  START ATORVASTATIN  20 MG  EVERY DAY  Your physician recommends that you return for lab work in:  [place] PSA   DX   272.4   V58.69  A chest x-ray takes a picture of the organs and structures inside the chest, including the heart, lungs, and blood vessels. This test can show several things, including, whether the heart is enlarges; whether fluid is building up in the lungs; and whether pacemaker / defibrillator leads are still in place.  Your physician has requested that you have a lexiscan myoview. For further information please visit [email or web address]. Please follow instruction sheet, as given.

## 2013-11-17 NOTE — Assessment & Plan Note (Signed)
Change ramapril to hyzaar check labs in 8 weeks

## 2013-11-17 NOTE — Assessment & Plan Note (Signed)
Counseled for less than 10 minutes No motivation to quit Check CXR

## 2013-11-17 NOTE — Progress Notes (Signed)
Patient ID: [name], male   DOB: [birth year], 58 y.o.   MRN: [id number] [name] is a 58 y.o. male who returns for evaluation of blood pressure.  He has a hx of CAD, s/p stent to RCA and LAD, ICM, HTN, HL, OSA, GERD, tobacco abuse. Last LHC 12/2006: LM 20%, pLAD 40%, mLAD stent ok, apical LAD 90% (1.5-68mm vessel), oD1 40-50%, AV groove CFX 30%, oOM1 30%, pRCA 30% (multiple), mRCA stent ok with 30-40% ISR, dRCA 30%, dAnt, inf-apical severe HK, EF 40%. Myoview 02/2010: EF 41%, septal, apical and inf infarct, no ischemia. Echo 09/2012: Mod LVH, focal basal hypertrophy, EF 30-35%, mid to dist inf-lat HK, Gr 1 diast dysfn, mild to mod LAE. Last seen by Dr. Jenkins Rouge earlier this month. BP was elevated and his ramipril was increased. Has continued to have elevated BPs. Ramipril was increased to 10 mg bid. Denies chest pain, dyspnea, syncope, orthopnea, PND, edema, weakness, speech difficulty, facial droop. Does note rare headaches.   Does not f/u with MD;s needs refills on meds Not taking statin BP seems elevated Some chest tightness with walking 1-2/month has not taking nitro no resting Pain Does not think its his heart       ROS: Denies fever, malais, weight loss, blurry vision, decreased visual acuity, cough, sputum, SOB, hemoptysis, pleuritic pain, palpitaitons, heartburn, abdominal pain, melena, lower extremity edema, claudication, or rash.  All other systems reviewed and negative  General: Affect appropriate Healthy:  appears stated age 41: normal Neck supple with no adenopathy JVP normal no bruits no thyromegaly Lungs clear with no wheezing and good diaphragmatic motion Heart:  S1/S2 no murmur, no rub, gallop or click PMI normal Abdomen: benighn, BS positve, no tenderness, no AAA no bruit.  No HSM or HJR Distal pulses intact with no bruits No edema Neuro non-focal Skin warm and dry No muscular weakness  Wrist surgery right side    Current Outpatient Prescriptions   Medication Sig Dispense Refill  . aspirin 325 MG tablet Take 1 tablet (325 mg total) by mouth daily.  10 tablet  0  . carvedilol (COREG) 25 MG tablet Take 1 tablet (25 mg total) by mouth 2 (two) times daily.  60 tablet  11  . clopidogrel (PLAVIX) 75 MG tablet Take 1 tablet (75 mg total) by mouth daily.  30 tablet  2  . nitroGLYCERIN (NITROSTAT) 0.4 MG SL tablet Place 1 tablet (0.4 mg total) under the tongue every 5 (five) minutes as needed for chest pain.  25 tablet  4  . ramipril (ALTACE) 10 MG capsule Take 1 capsule (10 mg total) by mouth 2 (two) times daily.  60 capsule  11   No current facility-administered medications for this visit.    Allergies  Review of patient's allergies indicates no known allergies.  Electrocardiogram:  2/27  SR old IMI rate 77   Assessment and Plan

## 2013-11-17 NOTE — Assessment & Plan Note (Signed)
Start lipitor 20 mg  F/U labs in 8 weeks

## 2013-11-26 ENCOUNTER — Encounter: Payer: Self-pay | Admitting: Cardiovascular Disease

## 2013-11-30 ENCOUNTER — Other Ambulatory Visit: Payer: Self-pay | Admitting: Cardiovascular Disease

## 2013-12-01 ENCOUNTER — Encounter: Payer: Self-pay | Admitting: Cardiology

## 2013-12-01 ENCOUNTER — Ambulatory Visit (INDEPENDENT_AMBULATORY_CARE_PROVIDER_SITE_OTHER)
Admission: RE | Admit: 2013-12-01 | Discharge: 2013-12-01 | Disposition: A | Discharge status: Home / Self Care | Payer: BC Managed Care – PPO | Source: Ambulatory Visit | Attending: Cardiovascular Disease | Admitting: Cardiovascular Disease

## 2013-12-01 ENCOUNTER — Ambulatory Visit (HOSPITAL_COMMUNITY): Payer: BC Managed Care – PPO | Attending: Cardiology | Admitting: Radiology

## 2013-12-01 VITALS — BP 128/66 | HR 60 | Ht 68.0 in | Wt 137.0 lb

## 2013-12-01 DIAGNOSIS — R0609 Other forms of dyspnea: Secondary | ICD-10-CM | POA: Insufficient documentation

## 2013-12-01 DIAGNOSIS — R002 Palpitations: Secondary | ICD-10-CM | POA: Insufficient documentation

## 2013-12-01 DIAGNOSIS — I10 Essential (primary) hypertension: Secondary | ICD-10-CM | POA: Insufficient documentation

## 2013-12-01 DIAGNOSIS — R0989 Other specified symptoms and signs involving the circulatory and respiratory systems: Secondary | ICD-10-CM | POA: Insufficient documentation

## 2013-12-01 DIAGNOSIS — R42 Dizziness and giddiness: Secondary | ICD-10-CM | POA: Insufficient documentation

## 2013-12-01 DIAGNOSIS — F17200 Nicotine dependence, unspecified, uncomplicated: Secondary | ICD-10-CM | POA: Insufficient documentation

## 2013-12-01 DIAGNOSIS — R0602 Shortness of breath: Secondary | ICD-10-CM | POA: Insufficient documentation

## 2013-12-01 DIAGNOSIS — I252 Old myocardial infarction: Secondary | ICD-10-CM | POA: Insufficient documentation

## 2013-12-01 DIAGNOSIS — R079 Chest pain, unspecified: Secondary | ICD-10-CM | POA: Insufficient documentation

## 2013-12-01 DIAGNOSIS — R51 Headache: Secondary | ICD-10-CM | POA: Insufficient documentation

## 2013-12-01 DIAGNOSIS — I2510 Atherosclerotic heart disease of native coronary artery without angina pectoris: Secondary | ICD-10-CM

## 2013-12-01 MED ORDER — TECHNETIUM TC 99M SESTAMIBI GENERIC - CARDIOLITE
10.8000 | Freq: Once | INTRAVENOUS | Status: AC | PRN
  Administered 2013-12-01: 11 via INTRAVENOUS

## 2013-12-01 MED ORDER — REGADENOSON 0.4 MG/5ML IV SOLN
0.4000 mg | Freq: Once | INTRAVENOUS | Status: AC
  Administered 2013-12-01: 0.4 mg via INTRAVENOUS

## 2013-12-01 MED ORDER — TECHNETIUM TC 99M SESTAMIBI GENERIC - CARDIOLITE
33.0000 | Freq: Once | INTRAVENOUS | Status: AC | PRN
  Administered 2013-12-01: 33 via INTRAVENOUS

## 2013-12-01 NOTE — Progress Notes (Signed)
[name] [place], [place] [address] [phone]    Cardiology Nuclear Med Study  [name] Emily is a 58 y.o. male     MRN : [id number]     DOB: 03-25-1956  Procedure Date: 12/01/2013  Nuclear Med Background Indication for Stress Test:  Evaluation for Ischemia and Stent Patency History:  CAD, MI, Cath 2008, Stent to RCA and LAD, Echo 2013 EF 30-35%, MPI 2011 (scar) EF 41% Cardiac Risk Factors: Hypertension, Lipids and Smoker  Symptoms:  Chest Pain with Exertion (last date of chest discomfort was one week ago), DOE and Palpitations   Nuclear Pre-Procedure Caffeine/Decaff Intake:  None NPO After: 7:00pm   Lungs:  clear O2 Sat: 97% on room air. IV 0.9% NS with Angio Cath:  20g  IV Site: R Hand  IV Started by:  Matilde Haymaker, RN  Chest Size (in):  38 Cup Size: n/a  Height: 5\' 8"  (1.727 m)  Weight:  137 lb (62.143 kg)  BMI:  Body mass index is 20.84 kg/(m^2). Tech Comments:  Coreg taken at 0400 this am    Nuclear Med Study 1 or 2 day study: 1 day  Stress Test Type:  Treadmill/Lexiscan  Reading MD: n/a  Order Authorizing Provider:  Verlin Dike  Resting Radionuclide: Technetium 77m Sestamibi  Resting Radionuclide Dose: 11.0 mCi   Stress Radionuclide:  Technetium 44m Sestamibi  Stress Radionuclide Dose: 31.8 mCi           Stress Protocol Rest HR: 60 Stress HR: 96  Rest BP: 128/66 Stress BP: 110/82  Exercise Time (min): n/a METS: n/a           Dose of Adenosine (mg):  n/a Dose of Lexiscan: 0.4 mg  Dose of Atropine (mg): n/a Dose of Dobutamine: n/a mcg/kg/min (at max HR)  Stress Test Technologist: Glade Lloyd, BS-ES  Nuclear Technologist:  Charlton Amor, CNMT     Rest Procedure:  Myocardial perfusion imaging was performed at rest 45 minutes following the intravenous administration of Technetium 68m Sestamibi. Rest ECG: SR, anterior infarct, age undetermined, negative T waves in the inferolateral leads  Stress  Procedure:  The patient received IV Lexiscan 0.4 mg over 15-seconds with concurrent low level exercise and then Technetium 31m Sestamibi was injected at 30-seconds while the patient continued walking one more minute.  Quantitative spect images were obtained after a 45-minute delay.  During the infusion of Lexiscan, the patient complained of SOB, lightheadedness, stomach feeling funny and headache.  These symptoms began to resolve in recovery.  Stress ECG: No significant change from baseline ECG  QPS Raw Data Images:  Normal; no motion artifact; normal heart/lung ratio. Stress Images:  There is a large size, severe severity perfusion defect in the basal and mid anteroseptal, interoseptal, apical anterior, apical septal, entire inferior wall and in the true apex (10 segments). Rest Images:  There is a large size, severe severity perfusion defect in the basal and mid anteroseptal, interoseptal, apical anterior, apical septal, entire inferior wall and in the true apex (10 segments). Subtraction (SDS):  No evidence of ischemia.  Transient Ischemic Dilatation (Normal <1.22):  1.00 Lung/Heart Ratio (Normal <0.45):  0.23  Quantitative Gated Spect Images QGS EDV:  169 ml QGS ESV:  102 ml  Impression Exercise Capacity:  Lexiscan with low level exercise. BP Response:  Normal blood pressure response. Clinical Symptoms:  There is dyspnea. ECG Impression:  No significant ST segment change suggestive of ischemia. Comparison with Prior  Nuclear Study: No images to compare  Overall Impression:  There is a large fixed perfusion defect in the entire RCA and mid LAD territory with no evidence of ischemia. A viability study should be considered if clinically indicated.   LV Ejection Fraction: 40%.  LV Wall Motion:  There is akinesis of the entire septum, mid and apical anterior walls, true apex and the entire inferior walls.    Ena Dawley, Lemmie Evens 12/01/2013

## 2013-12-04 ENCOUNTER — Telehealth: Payer: Self-pay | Admitting: Cardiovascular Disease

## 2013-12-04 DIAGNOSIS — J189 Pneumonia, unspecified organism: Secondary | ICD-10-CM

## 2013-12-04 DIAGNOSIS — R931 Abnormal findings on diagnostic imaging of heart and coronary circulation: Secondary | ICD-10-CM

## 2013-12-04 NOTE — Telephone Encounter (Signed)
PT AWARE OF TEST  RESULTS  NEED TO CALL PT  MON  TO SCHEDULE  2 WEEK APPT WITH  PA AND  CXR ALSO  NEEDS   CARDIAC  MRI  WITH  GADOLINIUM DX  LOW  EF .Adonis Housekeeper

## 2013-12-04 NOTE — Telephone Encounter (Signed)
°  Patient is returning your call, please call back.  °

## 2013-12-04 NOTE — Telephone Encounter (Signed)
F/u    Pt returning your call. Please call back

## 2013-12-14 ENCOUNTER — Encounter: Payer: Self-pay | Admitting: *Deleted

## 2013-12-14 ENCOUNTER — Other Ambulatory Visit: Payer: Self-pay | Admitting: *Deleted

## 2013-12-14 ENCOUNTER — Ambulatory Visit (INDEPENDENT_AMBULATORY_CARE_PROVIDER_SITE_OTHER): Payer: BC Managed Care – PPO | Admitting: Physician Assistant

## 2013-12-14 ENCOUNTER — Encounter: Payer: Self-pay | Admitting: Physician Assistant

## 2013-12-14 VITALS — BP 110/78 | HR 70 | Ht 68.0 in | Wt 143.0 lb

## 2013-12-14 DIAGNOSIS — Z01810 Encounter for preprocedural cardiovascular examination: Secondary | ICD-10-CM

## 2013-12-14 DIAGNOSIS — R918 Other nonspecific abnormal finding of lung field: Secondary | ICD-10-CM

## 2013-12-14 DIAGNOSIS — I10 Essential (primary) hypertension: Secondary | ICD-10-CM

## 2013-12-14 DIAGNOSIS — E785 Hyperlipidemia, unspecified: Secondary | ICD-10-CM

## 2013-12-14 DIAGNOSIS — I429 Cardiomyopathy, unspecified: Secondary | ICD-10-CM

## 2013-12-14 DIAGNOSIS — I2510 Atherosclerotic heart disease of native coronary artery without angina pectoris: Secondary | ICD-10-CM

## 2013-12-14 DIAGNOSIS — I428 Other cardiomyopathies: Secondary | ICD-10-CM

## 2013-12-14 DIAGNOSIS — R9389 Abnormal findings on diagnostic imaging of other specified body structures: Secondary | ICD-10-CM

## 2013-12-14 DIAGNOSIS — F17200 Nicotine dependence, unspecified, uncomplicated: Secondary | ICD-10-CM

## 2013-12-14 NOTE — H&P (Signed)
History and Physical   Date:  12/14/2013   ID:  [name], DOB [birth year], MRN [id number]  PCP:  Isaac Rouge, MD  Cardiologist:  [name]     History of Present Illness: [name] is a 58 y.o. male with a hx of CAD, s/p stent to RCA and LAD, ICM (EF ~ 40%), HTN, HL, OSA, GERD, tobacco abuse.  Last cath in 2008 demonstrated patent stents in the LAD and RCA.  Med Rx was continued.  Last seen by Dr. Johnsie Cancel 11/17/13. Blood pressure was uncontrolled. Medications were adjusted. He was placed back on statin therapy.  Patient noted some symptoms of chest pain.  Lexiscan Myoview demonstrated significant inferior and anterior scar but no ischemia. Cardiac MRI is pending to assess for viability. I spoke with Dr. Johnsie Cancel this morning who would like the patient set up for cardiac catheterization.  Patient denies chest pain. He notes chronic DOE.  Denies any worsening.  He is NYHA 2b.  No orthopnea, PND, edema.  No syncope.  He fell and bruised his ribs on the left 2 mos ago.  This area still hurts with positional changes.  No significant cough.  No fever.     Recent Labs: 01/01/2013: Creatinine 0.7; Potassium 4.1  Chest X-Ray(12/01/13): IMPRESSION: 1. Mild infiltrate left lung base. Mild pneumonia could present in this fashion. Followup chest x-ray suggested to demonstrate clearing. 2. Probable EKG pads over both upper lobes. Repeat chest x-ray without EKG pads suggested to exclude underlying pulmonary nodule.    Wt Readings from Last 3 Encounters:  12/14/13 143 lb (64.864 kg)  12/01/13 137 lb (62.143 kg)  11/17/13 146 lb (66.225 kg)     Past Medical History  Diagnosis Date  . Mixed hyperlipidemia   . GERD (gastroesophageal reflux disease)   . Hypertension     under control with med., has been on med. > 12 yr.  . 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 Sleep apnea     uses CPAP nightly  . Dental bridge present     lower  . Ischemic cardiomyopathy      a. Echo 09/2012:  Mod LVH, focal basal hypertrophy, EF 30-35%, mid to dist inf-lat HK, Gr 1 diast dysfn, mild to mod LAE  . CAD (coronary artery disease)     a. s/p prior MI; hx of stent to RCA and LAD;  b. LHC 12/2006:  LM 20%, pLAD 40%, mLAD stent ok, apical LAD 90% (1.5-56mm vessel), oD1 40-50%, AV groove CFX 30%, oOM1 30%, pRCA 30% (multiple), mRCA stent ok with 30-40% ISR, dRCA 30%, dAnt, inf-apical severe HK, EF 40% => med Rx.;      . Hx of cardiovascular stress test     a.  Myoview (02/2010): EF 41%, septal, apical and inf infarct, no ischemia;   b.  Lexiscan Myoview (12/02/13): Large fixed perfusion defect in the RCA and LAD territory, no ischemia, EF 40%.    Current Outpatient Prescriptions  Medication Sig Dispense Refill  . aspirin 325 MG tablet Take 1 tablet (325 mg total) by mouth daily.  10 tablet  0  . atorvastatin (LIPITOR) 20 MG tablet Take 1 tablet (20 mg total) by mouth daily.  90 tablet  3  . carvedilol (COREG) 25 MG tablet Take 1 tablet (25 mg total) by mouth 2 (two) times daily.  60 tablet  11  . clopidogrel (PLAVIX) 75 MG tablet Take 1 tablet (75 mg total) by mouth  daily.  90 tablet  3  . losartan-hydrochlorothiazide (HYZAAR) 100-25 MG per tablet Take 1 tablet by mouth daily.  90 tablet  3  . nitroGLYCERIN (NITROSTAT) 0.4 MG SL tablet Place 1 tablet (0.4 mg total) under the tongue every 5 (five) minutes as needed for chest pain.  25 tablet  4   No current facility-administered medications for this visit.    Allergies:   Review of patient's allergies indicates no known allergies.   Social History:  The patient  reports that he has been smoking Cigarettes.  He has a 20 pack-year smoking history. He has never used smokeless tobacco. He reports that he drinks alcohol. He reports that he does not use illicit drugs.   Family History:  The patient's family history includes Heart disease in his father, maternal grandfather, maternal grandmother, paternal grandfather, and paternal  grandmother.   ROS:  Please see the history of present illness.      All other systems reviewed and negative.   PHYSICAL EXAM: VS:  BP 110/78  Pulse 70  Ht 5\' 8"  (1.727 m)  Wt 143 lb (64.864 kg)  BMI 21.75 kg/m2 Well nourished, well developed, in no acute distress HEENT: normal Neck: no JVD Cardiac:  normal S1, S2; RRR; no murmur Chest:  Lower anterior rib enlargement/prominene (~rib 9) nontender Lungs:  clear to auscultation bilaterally, no wheezing, rhonchi or rales Abd: soft, nontender, no hepatomegaly Ext: no edema Skin: warm and dry Neuro:  CNs 2-12 intact, no focal abnormalities noted  EKG:  NSR, HR 70, PRWP (anteroseptal Q waves), TWI in V3-5, no change from prior tracing.     ASSESSMENT AND PLAN:  1. CAD:  Patient with recent myoview with significant scar.  [name] would like the patient set up for cardiac cath as well as cMRI (to assess viability).  Continue ASA, Plavix, statin.  Risks and benefits of cardiac catheterization have been discussed with the patient.  These include bleeding, infection, kidney damage, stroke, heart attack, death.  The patient understands these risks and is willing to proceed.  2. Ischemic Cardiomyopathy:  Continue beta blocker, ARB.  Check BMET today.  Consider adding Spironolactone.  As noted, patient to undergo cardiac cath and cMRI is pending. 3. Hypertension:  Controlled.  Patient did not stop Ramipril.  Will stop this for now.  He will keep an eye on his BP.  could consider Spironolactone if BP high on ARB and beta blocker alone. 4. Hyperlipidemia:  Continue statin.  5. Abnormal CXR:  Will arrange repeat CXR.  He has no symptoms of pneumonia.  Get CBC with diff with precath labs today.  Question if he fractured a L lower anterior rib. Will see if rib films can be done as well.    6. Tobacco Abuse:  He has been counseled on the importance of quitting in the past.   7. Disposition:  Proceed with left heart cath and cMRI as planned.    Signed, Richardson Dopp, PA-C  12/14/2013 3:44 PM    Patient still smoking with chest pain and myovue with new infarcts in setting of previous Stents and decreased EF  Exam remarkable for expitory wheezing and COPD    [name]

## 2013-12-14 NOTE — Progress Notes (Signed)
[address], [address] [place], [place]  [address] Phone: [phone] Fax:  [phone]  Date:  12/14/2013   ID:  Robertlee R Rash, DOB 05/23/1956, MRN [id number]  PCP:  Peter Nishan, MD  Cardiologist:  Dr. Peter Nishan     History of Present Illness: [name] is a 58 y.o. male with a hx of CAD, s/p stent to RCA and LAD, ICM (EF ~ 40%), HTN, HL, OSA, GERD, tobacco abuse.  Last cath in 2008 demonstrated patent stents in the LAD and RCA.  Med Rx was continued.  Last seen by Dr. Nishan 11/17/13. Blood pressure was uncontrolled. Medications were adjusted. He was placed back on statin therapy.  Patient noted some symptoms of chest pain.  Lexiscan Myoview demonstrated significant inferior and anterior scar but no ischemia. Cardiac MRI is pending to assess for viability. I spoke with Dr. Nishan this morning who would like the patient set up for cardiac catheterization.  Patient denies chest pain. He notes chronic DOE.  Denies any worsening.  He is NYHA 2b.  No orthopnea, PND, edema.  No syncope.  He fell and bruised his ribs on the left 2 mos ago.  This area still hurts with positional changes.  No significant cough.  No fever.     Recent Labs: 01/01/2013: Creatinine 0.7; Potassium 4.1  Chest X-Ray(12/01/13): IMPRESSION: 1. Mild infiltrate left lung base. Mild pneumonia could present in this fashion. Followup chest x-ray suggested to demonstrate clearing. 2. Probable EKG pads over both upper lobes. Repeat chest x-ray without EKG pads suggested to exclude underlying pulmonary nodule.    Wt Readings from Last 3 Encounters:  12/14/13 143 lb (64.864 kg)  12/01/13 137 lb (62.143 kg)  11/17/13 146 lb (66.225 kg)     Past Medical History  Diagnosis Date  . Mixed hyperlipidemia   . GERD (gastroesophageal reflux disease)   . Hypertension     under control with med., has been on med. > 12 yr.  . 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 Sleep apnea     uses CPAP  nightly  . Dental bridge present     lower  . Ischemic cardiomyopathy     a. Echo 09/2012:  Mod LVH, focal basal hypertrophy, EF 30-35%, mid to dist inf-lat HK, Gr 1 diast dysfn, mild to mod LAE  . CAD (coronary artery disease)     a. s/p prior MI; hx of stent to RCA and LAD;  b. LHC 12/2006:  LM 20%, pLAD 40%, mLAD stent ok, apical LAD 90% (1.5-2mm vessel), oD1 40-50%, AV groove CFX 30%, oOM1 30%, pRCA 30% (multiple), mRCA stent ok with 30-40% ISR, dRCA 30%, dAnt, inf-apical severe HK, EF 40% => med Rx.;      . Hx of cardiovascular stress test     a.  Myoview (02/2010): EF 41%, septal, apical and inf infarct, no ischemia;   b.  Lexiscan Myoview (12/02/13): Large fixed perfusion defect in the RCA and LAD territory, no ischemia, EF 40%.    Current Outpatient Prescriptions  Medication Sig Dispense Refill  . aspirin 325 MG tablet Take 1 tablet (325 mg total) by mouth daily.  10 tablet  0  . atorvastatin (LIPITOR) 20 MG tablet Take 1 tablet (20 mg total) by mouth daily.  90 tablet  3  . carvedilol (COREG) 25 MG tablet Take 1 tablet (25 mg total) by mouth 2 (two) times daily.  60 tablet  11  . clopidogrel (  PLAVIX) 75 MG tablet Take 1 tablet (75 mg total) by mouth daily.  90 tablet  3  . losartan-hydrochlorothiazide (HYZAAR) 100-25 MG per tablet Take 1 tablet by mouth daily.  90 tablet  3  . nitroGLYCERIN (NITROSTAT) 0.4 MG SL tablet Place 1 tablet (0.4 mg total) under the tongue every 5 (five) minutes as needed for chest pain.  25 tablet  4   No current facility-administered medications for this visit.    Allergies:   Review of patient's allergies indicates no known allergies.   Social History:  The patient  reports that he has been smoking Cigarettes.  He has a 20 pack-year smoking history. He has never used smokeless tobacco. He reports that he drinks alcohol. He reports that he does not use illicit drugs.   Family History:  The patient's family history includes Heart disease in his father,  maternal grandfather, maternal grandmother, paternal grandfather, and paternal grandmother.   ROS:  Please see the history of present illness.      All other systems reviewed and negative.   PHYSICAL EXAM: VS:  BP 110/78  Pulse 70  Ht 5\' 8"  (1.727 m)  Wt 143 lb (64.864 kg)  BMI 21.75 kg/m2 Well nourished, well developed, in no acute distress HEENT: normal Neck: no JVD Cardiac:  normal S1, S2; RRR; no murmur Chest:  Lower anterior rib enlargement/prominene (~rib 9) nontender Lungs:  clear to auscultation bilaterally, no wheezing, rhonchi or rales Abd: soft, nontender, no hepatomegaly Ext: no edema Skin: warm and dry Neuro:  CNs 2-12 intact, no focal abnormalities noted  EKG:  NSR, HR 70, PRWP (anteroseptal Q waves), TWI in V3-5, no change from prior tracing.     ASSESSMENT AND PLAN:  1. CAD:  Patient with recent myoview with significant scar.  Dr. Jenkins Rouge would like the patient set up for cardiac cath as well as cMRI (to assess viability).  Continue ASA, Plavix, statin.  Risks and benefits of cardiac catheterization have been discussed with the patient.  These include bleeding, infection, kidney damage, stroke, heart attack, death.  The patient understands these risks and is willing to proceed.  2. Ischemic Cardiomyopathy:  Continue beta blocker, ARB.  Check BMET today.  Consider adding Spironolactone.  As noted, patient to undergo cardiac cath and cMRI is pending. 3. Hypertension:  Controlled.  Patient did not stop Ramipril.  Will stop this for now.  He will keep an eye on his BP.  could consider Spironolactone if BP high on ARB and beta blocker alone. 4. Hyperlipidemia:  Continue statin.  5. Abnormal CXR:  Will arrange repeat CXR.  He has no symptoms of pneumonia.  Get CBC with diff with precath labs today.  Question if he fractured a L lower anterior rib. Will see if rib films can be done as well.    6. Tobacco Abuse:  He has been counseled on the importance of quitting in the  past.   7. Disposition:  Proceed with left heart cath and cMRI as planned.   Signed, Richardson Dopp, PA-C  12/14/2013 3:44 PM

## 2013-12-14 NOTE — Patient Instructions (Signed)
Your physician has requested that you have a cardiac catheterization. Cardiac catheterization is used to diagnose and/or treat various heart conditions. Doctors may recommend this procedure for a number of different reasons. The most common reason is to evaluate chest pain. Chest pain can be a symptom of coronary artery disease (CAD), and cardiac catheterization can show whether plaque is narrowing or blocking your heart's arteries. This procedure is also used to evaluate the valves, as well as measure the blood flow and oxygen levels in different parts of your heart. For further information please visit [email or web address][email or web address]. Please follow instruction sheet, as given.  Your physician recommends that you return for lab work in: TODAY BMET, CBC W/DIFF, PT/INR  A chest x-ray TOMORROW 12/15/13 @ [facility] takes a picture of the organs and structures inside the chest, including the heart, lungs, and blood vessels. This test can show several things, including, whether the heart is enlarges; whether fluid is building up in the lungs; and whether pacemaker / defibrillator leads are still in place.   Your physician has requested that you have a cardiac MRI. TO BE DONE AT [facility] THIS IS TO BE DONE ON A DAY DR. Johnsie Cancel CAN READ THIS TEST. DX CARIOMYOPATHY. Cardiac MRI uses a computer to create images of your heart as its beating, producing both still and moving pictures of your heart and major blood vessels. For further information please visit [email or web address] Please follow the instruction sheet given to you today for more information.  STOP RAMIPRIL CALL IF BLOOD PRESSURE IS ABOVE 140/90

## 2013-12-15 ENCOUNTER — Ambulatory Visit (INDEPENDENT_AMBULATORY_CARE_PROVIDER_SITE_OTHER)
Admission: RE | Admit: 2013-12-15 | Discharge: 2013-12-15 | Disposition: A | Discharge status: Home / Self Care | Payer: BC Managed Care – PPO | Source: Ambulatory Visit | Attending: Physician Assistant | Admitting: Physician Assistant

## 2013-12-15 DIAGNOSIS — I2510 Atherosclerotic heart disease of native coronary artery without angina pectoris: Secondary | ICD-10-CM

## 2013-12-15 LAB — CBC WITH DIFFERENTIAL/PLATELET
Basophils Absolute: 0.1 10*3/uL (ref 0.0–0.1)
Basophils Relative: 0.6 % (ref 0.0–3.0)
Eosinophils Absolute: 0.8 10*3/uL — ABNORMAL HIGH (ref 0.0–0.7)
Eosinophils Relative: 6.8 % — ABNORMAL HIGH (ref 0.0–5.0)
HCT: 47.6 % (ref 39.0–52.0)
Hemoglobin: 15.6 g/dL (ref 13.0–17.0)
Lymphocytes Relative: 31.5 % (ref 12.0–46.0)
Lymphs Abs: 3.8 10*3/uL (ref 0.7–4.0)
MCHC: 32.8 g/dL (ref 30.0–36.0)
MCV: 92.3 fl (ref 78.0–100.0)
Monocytes Absolute: 0.7 10*3/uL (ref 0.1–1.0)
Monocytes Relative: 6.2 % (ref 3.0–12.0)
Neutro Abs: 6.7 10*3/uL (ref 1.4–7.7)
Neutrophils Relative %: 54.9 % (ref 43.0–77.0)
Platelets: 217 10*3/uL (ref 150.0–400.0)
RBC: 5.16 Mil/uL (ref 4.22–5.81)
RDW: 13.3 % (ref 11.5–14.6)
WBC: 12.1 10*3/uL — ABNORMAL HIGH (ref 4.5–10.5)

## 2013-12-15 LAB — PROTIME-INR
INR: 1.3 ratio — ABNORMAL HIGH (ref 0.8–1.0)
Prothrombin Time: 13.2 s — ABNORMAL HIGH (ref 10.2–12.4)

## 2013-12-15 LAB — BASIC METABOLIC PANEL
BUN: 15 mg/dL (ref 6–23)
CO2: 27 mEq/L (ref 19–32)
Calcium: 9.2 mg/dL (ref 8.4–10.5)
Chloride: 103 mEq/L (ref 96–112)
Creatinine, Ser: 0.6 mg/dL (ref 0.4–1.5)
GFR: 153.05 mL/min (ref >60.00)
Glucose, Bld: 88 mg/dL (ref 70–99)
Potassium: 4 mEq/L (ref 3.5–5.1)
Sodium: 137 mEq/L (ref 135–145)

## 2013-12-16 ENCOUNTER — Telehealth: Payer: Self-pay | Admitting: *Deleted

## 2013-12-16 NOTE — Telephone Encounter (Signed)
pt notified about cxr results and ok for cath 12/21/13 with verbal understanding

## 2013-12-21 ENCOUNTER — Encounter (HOSPITAL_COMMUNITY)
Admission: RE | Disposition: A | Discharge status: Home / Self Care | Payer: Self-pay | Source: Ambulatory Visit | Attending: Cardiovascular Disease

## 2013-12-21 ENCOUNTER — Encounter (HOSPITAL_COMMUNITY): Payer: Self-pay | Admitting: Nurse Practitioner

## 2013-12-21 ENCOUNTER — Ambulatory Visit (HOSPITAL_COMMUNITY)
Admission: RE | Admit: 2013-12-21 | Discharge: 2013-12-21 | Disposition: A | Discharge status: Home / Self Care | Payer: BC Managed Care – PPO | Source: Ambulatory Visit | Attending: Cardiovascular Disease | Admitting: Cardiovascular Disease

## 2013-12-21 ENCOUNTER — Other Ambulatory Visit: Payer: Self-pay | Admitting: *Deleted

## 2013-12-21 DIAGNOSIS — Z7902 Long term (current) use of antithrombotics/antiplatelets: Secondary | ICD-10-CM | POA: Insufficient documentation

## 2013-12-21 DIAGNOSIS — Z9861 Coronary angioplasty status: Secondary | ICD-10-CM | POA: Insufficient documentation

## 2013-12-21 DIAGNOSIS — G4733 Obstructive sleep apnea (adult) (pediatric): Secondary | ICD-10-CM | POA: Insufficient documentation

## 2013-12-21 DIAGNOSIS — I200 Unstable angina: Secondary | ICD-10-CM | POA: Insufficient documentation

## 2013-12-21 DIAGNOSIS — F17200 Nicotine dependence, unspecified, uncomplicated: Secondary | ICD-10-CM | POA: Insufficient documentation

## 2013-12-21 DIAGNOSIS — K219 Gastro-esophageal reflux disease without esophagitis: Secondary | ICD-10-CM | POA: Insufficient documentation

## 2013-12-21 DIAGNOSIS — I2510 Atherosclerotic heart disease of native coronary artery without angina pectoris: Secondary | ICD-10-CM

## 2013-12-21 DIAGNOSIS — E782 Mixed hyperlipidemia: Secondary | ICD-10-CM | POA: Insufficient documentation

## 2013-12-21 DIAGNOSIS — I2589 Other forms of chronic ischemic heart disease: Secondary | ICD-10-CM | POA: Insufficient documentation

## 2013-12-21 DIAGNOSIS — I2582 Chronic total occlusion of coronary artery: Secondary | ICD-10-CM | POA: Insufficient documentation

## 2013-12-21 DIAGNOSIS — Z01810 Encounter for preprocedural cardiovascular examination: Secondary | ICD-10-CM

## 2013-12-21 DIAGNOSIS — I10 Essential (primary) hypertension: Secondary | ICD-10-CM | POA: Diagnosis present

## 2013-12-21 DIAGNOSIS — Z720 Tobacco use: Secondary | ICD-10-CM | POA: Diagnosis present

## 2013-12-21 DIAGNOSIS — E785 Hyperlipidemia, unspecified: Secondary | ICD-10-CM | POA: Diagnosis present

## 2013-12-21 DIAGNOSIS — R0989 Other specified symptoms and signs involving the circulatory and respiratory systems: Secondary | ICD-10-CM | POA: Insufficient documentation

## 2013-12-21 DIAGNOSIS — Z8249 Family history of ischemic heart disease and other diseases of the circulatory system: Secondary | ICD-10-CM | POA: Insufficient documentation

## 2013-12-21 DIAGNOSIS — R0609 Other forms of dyspnea: Secondary | ICD-10-CM | POA: Insufficient documentation

## 2013-12-21 DIAGNOSIS — Y849 Medical procedure, unspecified as the cause of abnormal reaction of the patient, or of later complication, without mention of misadventure at the time of the procedure: Secondary | ICD-10-CM | POA: Insufficient documentation

## 2013-12-21 DIAGNOSIS — Z7982 Long term (current) use of aspirin: Secondary | ICD-10-CM | POA: Insufficient documentation

## 2013-12-21 DIAGNOSIS — I429 Cardiomyopathy, unspecified: Secondary | ICD-10-CM

## 2013-12-21 HISTORY — PX: LEFT HEART CATHETERIZATION WITH CORONARY ANGIOGRAM: SHX5451

## 2013-12-21 LAB — PLATELET INHIBITION P2Y12: Platelet Function  P2Y12: 54 [PRU] — ABNORMAL LOW (ref 194–418)

## 2013-12-21 SURGERY — LEFT HEART CATHETERIZATION WITH CORONARY ANGIOGRAM
Anesthesia: LOCAL

## 2013-12-21 MED ORDER — SODIUM CHLORIDE 0.9 % IV SOLN: 250.0000 mL | INTRAVENOUS | Status: DC | PRN

## 2013-12-21 MED ORDER — SODIUM CHLORIDE 0.9 % IV SOLN: INTRAVENOUS | Status: AC

## 2013-12-21 MED ORDER — ISOSORBIDE MONONITRATE ER 30 MG PO TB24: 30.0000 mg | ORAL_TABLET | Freq: Every day | ORAL | Status: DC

## 2013-12-21 MED ORDER — MORPHINE SULFATE 2 MG/ML IJ SOLN: 2.0000 mg | INTRAMUSCULAR | Status: DC | PRN

## 2013-12-21 MED ORDER — ASPIRIN 325 MG PO TABS
325.0000 mg | ORAL_TABLET | Freq: Every day | ORAL | Status: DC
  Filled 2013-12-21: qty 1

## 2013-12-21 MED ORDER — OXYCODONE-ACETAMINOPHEN 5-325 MG PO TABS: 1.0000 | ORAL_TABLET | ORAL | Status: DC | PRN

## 2013-12-21 MED ORDER — CARVEDILOL 12.5 MG PO TABS
12.5000 mg | ORAL_TABLET | Freq: Two times a day (BID) | ORAL | Status: DC
  Administered 2013-12-21: 12.5 mg via ORAL
  Filled 2013-12-21 (×2): qty 1

## 2013-12-21 MED ORDER — SODIUM CHLORIDE 0.9 % IJ SOLN: 3.0000 mL | INTRAMUSCULAR | Status: DC | PRN

## 2013-12-21 MED ORDER — LOSARTAN POTASSIUM-HCTZ 100-25 MG PO TABS: 1.0000 | ORAL_TABLET | Freq: Every day | ORAL | Status: DC

## 2013-12-21 MED ORDER — HYDROCHLOROTHIAZIDE 25 MG PO TABS
25.0000 mg | ORAL_TABLET | Freq: Every day | ORAL | Status: DC
  Administered 2013-12-21: 25 mg via ORAL
  Filled 2013-12-21: qty 1

## 2013-12-21 MED ORDER — SODIUM CHLORIDE 0.9 % IV SOLN
INTRAVENOUS | Status: DC
  Administered 2013-12-21: 09:00:00 via INTRAVENOUS

## 2013-12-21 MED ORDER — ATORVASTATIN CALCIUM 20 MG PO TABS
20.0000 mg | ORAL_TABLET | Freq: Every day | ORAL | Status: DC
  Administered 2013-12-21: 20 mg via ORAL
  Filled 2013-12-21: qty 1

## 2013-12-21 MED ORDER — ALPRAZOLAM 0.25 MG PO TABS: 0.2500 mg | ORAL_TABLET | Freq: Three times a day (TID) | ORAL | Status: DC | PRN

## 2013-12-21 MED ORDER — SODIUM CHLORIDE 0.9 % IJ SOLN
3.0000 mL | Freq: Two times a day (BID) | INTRAMUSCULAR | Status: DC
Start: 2013-12-21 — End: 2013-12-21

## 2013-12-21 MED ORDER — LOSARTAN POTASSIUM 50 MG PO TABS
100.0000 mg | ORAL_TABLET | Freq: Every day | ORAL | Status: DC
  Administered 2013-12-21: 100 mg via ORAL
  Filled 2013-12-21: qty 2

## 2013-12-21 MED ORDER — ASPIRIN 81 MG PO CHEW
81.0000 mg | CHEWABLE_TABLET | ORAL | Status: DC
  Filled 2013-12-21: qty 1

## 2013-12-21 MED ORDER — NITROGLYCERIN 0.4 MG SL SUBL: 0.4000 mg | SUBLINGUAL_TABLET | SUBLINGUAL | Status: DC | PRN

## 2013-12-21 NOTE — Progress Notes (Signed)
Patient anxious about going home and coming back for surgery. Very upset about spending the night. "They should have told me all of this when I came this morning."

## 2013-12-21 NOTE — Progress Notes (Signed)
Received from Cath lab. Assessed per flow sheet. Denies chest pain or shortness of breath. Right groin dressing is dry/intact with no bleeding or hematoma present. Post activity and precautions explained. VSS. Patient very anxious to go home. Reassurance provided. Family near. Will continue to monitor.[name]

## 2013-12-21 NOTE — Interval H&P Note (Signed)
History and Physical Interval Note:  12/21/2013 11:58 AM  Gwendolyn Grant  has presented today for cardiac cath with the diagnosis of Chest pain/LV systolic dysfunction. The various methods of treatment have been discussed with the patient and family. After consideration of risks, benefits and other options for treatment, the patient has consented to  Procedure(s): LEFT HEART CATHETERIZATION WITH CORONARY ANGIOGRAM (N/A) as a surgical intervention .  The patient's history has been reviewed, patient examined, no change in status, stable for surgery.  I have reviewed the patient's chart and labs.  Questions were answered to the patient's satisfaction.    Cath Lab Visit (complete for each Cath Lab visit)  Clinical Evaluation Leading to the Procedure:   ACS: no  Non-ACS:    Anginal Classification: CCS III  Anti-ischemic medical therapy: Minimal Therapy (1 class of medications)  Non-Invasive Test Results: Intermediate-risk stress test findings: cardiac mortality 1-3%/year  Prior CABG: No previous CABG         Avondre Richens

## 2013-12-21 NOTE — Consult Note (Signed)
[address]       ,[place] [address]             [phone]      Cardiothoracic Surgery Consultation  Reason for Consult: Severe 2-vessel coronary artery disease Referring Physician:  Dr. Lauree Chandler  Primary Cardiologist:  Dr. Jenkins Rouge  [name] is an 58 y.o. male.  HPI:   He has a history of hypertension, hyperlipidemia, ongoing 1 ppd smoker with a history of coronary disease and multiple prior PCI's. He had bare metal stenting of the LAD and RCA in 2002 and again in 2008. He has been maintained on Plavix. He has a several month history of exertional dyspnea and fatigue. He recently had a Lexiscan myoview showing significant anterior and inferior scar but no ischemia. Cath today shows 95% ostial LAD stenosis, 60% restenosis in the mid-stented segment, and 99% stenosis distally as it wraps the apex. The LCX has no significant stenosis. The RCA is occluded in the mid-stented segment with filling of the PDA by collaterals from the left. The LVEF is 35-40%.  Past Medical History  Diagnosis Date  . Mixed hyperlipidemia   . GERD (gastroesophageal reflux disease)   . Hypertension     under control with med., has been on med. > 12 yr.  . 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 Sleep apnea     uses CPAP nightly  . Dental bridge present     lower  . Ischemic cardiomyopathy     a. Echo 09/2012:  Mod LVH, focal basal hypertrophy, EF 30-35%, mid to dist inf-lat HK, Gr 1 diast dysfn, mild to mod LAE  . CAD (coronary artery disease)     a. s/p prior MI; hx of stent to RCA and LAD;  b. LHC 12/2006:  LM 20%, pLAD 40%, mLAD stent ok, apical LAD 90% (1.5-23mm vessel), oD1 40-50%, AV groove CFX 30%, oOM1 30%, pRCA 30% (multiple), mRCA stent ok with 30-40% ISR, dRCA 30%, dAnt, inf-apical severe HK, EF 40% => med Rx.;      . Hx of cardiovascular stress test     a.  Myoview (02/2010): EF 41%, septal, apical and inf infarct, no ischemia;    b.  Lexiscan Myoview (12/02/13): Large fixed perfusion defect in the RCA and LAD territory, no ischemia, EF 40%.    Past Surgical History  Procedure Laterality Date  . Knee arthroscopy      left knee x 1, right knee x 2  . Coronary angioplasty with stent placement  05/22/2001    PCI - RCA - LAD  . Coronary angioplasty with stent placement  11/11/2006    PCI - BMS - LAD  . Coronary angioplasty with stent placement  11/15/2006    PCI - BMS - RCA  . Coronary angioplasty with stent placement  03/16/2010  . Cardiac catheterization  06/27/2001; 12/13/2006; 02/20/2010  . Open reduction internal fixation (orif) scaphoid with iliac crest bone graft  10/15/2012    Procedure: OPEN REDUCTION INTERNAL FIXATION (ORIF) SCAPHOID WITH ILIAC CREST BONE GRAFT;  Surgeon: Schuyler Amor, MD;  Location: [facility];  Service: Orthopedics;  Laterality: Right;  No iliac creast bone graft taken, used graft from radius    Family History  Problem Relation Age of Onset  . Heart disease Father   . Heart disease Maternal Grandmother   . Heart disease Maternal Grandfather   . Heart disease Paternal  Grandmother   . Heart disease Paternal Grandfather     Social History:  reports that he has been smoking Cigarettes.  He has a 20 pack-year smoking history. He has never used smokeless tobacco. He reports that he drinks alcohol. He reports that he does not use illicit drugs.  Allergies: No Known Allergies  Medications:  I have reviewed the patient's current medications. Prior to Admission:  Prescriptions prior to admission  Medication Sig Dispense Refill  . aspirin 325 MG tablet Take 1 tablet (325 mg total) by mouth daily.  10 tablet  0  . atorvastatin (LIPITOR) 20 MG tablet Take 1 tablet (20 mg total) by mouth daily.  90 tablet  3  . carvedilol (COREG) 12.5 MG tablet Take 12.5 mg by mouth 2 (two) times daily with a meal.      . clopidogrel (PLAVIX) 75 MG tablet Take 1 tablet (75 mg total) by mouth  daily.  90 tablet  3  . losartan-hydrochlorothiazide (HYZAAR) 100-25 MG per tablet Take 1 tablet by mouth daily.  90 tablet  3  . nitroGLYCERIN (NITROSTAT) 0.4 MG SL tablet Place 1 tablet (0.4 mg total) under the tongue every 5 (five) minutes as needed for chest pain.  25 tablet  4   Scheduled:  Continuous: . sodium chloride 300 mL (12/21/13 1334)   PRN:  No results found for this or any previous visit (from the past 48 hour(s)).  No results found.  Review of Systems  Constitutional: Positive for malaise/fatigue. Negative for fever, chills, weight loss and diaphoresis.  HENT: Negative.   Eyes: Negative.   Respiratory: Positive for shortness of breath. Negative for cough.   Cardiovascular: Negative for chest pain, palpitations, orthopnea, leg swelling and PND.  Gastrointestinal: Negative.   Genitourinary: Negative.   Musculoskeletal: Negative.   Skin: Negative.   Neurological: Negative.   Endo/Heme/Allergies: Negative.   Psychiatric/Behavioral: Negative.    Blood pressure 126/69, pulse 57, temperature 97.7 F (36.5 C), temperature source Oral, resp. rate 18, height 5\' 7"  (1.702 m), weight 64.864 kg (143 lb), SpO2 100.00%. Physical Exam  Constitutional: He is oriented to person, place, and time. He appears well-developed and well-nourished. No distress.  HENT:  Head: Normocephalic and atraumatic.  Mouth/Throat: Oropharynx is clear and moist.  Eyes: EOM are normal. Pupils are equal, round, and reactive to light.  Neck: Normal range of motion. Neck supple. No JVD present. No thyromegaly present.  Cardiovascular: Normal rate, regular rhythm, normal heart sounds and intact distal pulses.   No murmur heard. Respiratory: Effort normal and breath sounds normal. No respiratory distress. He has no rales.  GI: Soft. Bowel sounds are normal. He exhibits no distension and no mass. There is no tenderness.  Musculoskeletal: Normal range of motion. He exhibits no edema.  Lymphadenopathy:     He has no cervical adenopathy.  Neurological: He is alert and oriented to person, place, and time. He has normal strength. No cranial nerve deficit or sensory deficit.  Skin: Skin is warm and dry.  Psychiatric: He has a normal mood and affect.    Cardiac Catheterization Operative Report  [name]  [id number]  2/16/201512:05 PM  Jenkins Rouge, MD  Procedure Performed:  1. Left Heart Catheterization 2. Selective Coronary Angiography 3. Left ventricular angiogram Operator: Lauree Chandler, MD  Arterial access site: Right radial artery.  Indication: 58 yo male with history of CAD s/p bare metal stent to RCA in 2002 and 2008 and bare metal stent LAD in 2002 and 2008,  ICM (EF ~ 40%), HTN, HL, OSA, GERD, tobacco abuse with recent dyspnea on exertion concerning for angina. Last cath in 2008 demonstrated patent stents in the LAD and RCA. Med Rx was continued at that time. Recent stress myoview with anterior and inferior wall scar with reduced LVEF, no clear ischemia. Cardiac cath today to exclude progression of CAD.  Procedure Details:  The risks, benefits, complications, treatment options, and expected outcomes were discussed with the patient. The patient and/or family concurred with the proposed plan, giving informed consent. The patient was brought to the cath lab after IV hydration was begun and oral premedication was given. The patient was further sedated with Versed and Fentanyl. The right wrist was assessed with an Allens test which was positive. The right wrist was prepped and draped in a sterile fashion. 1% lidocaine was used for local anesthesia. Using the modified Seldinger access technique I attempted to pass a wire into the right radial artery but the wire could not be advanced beyond his forearm. We then prepped the right groin and used 1% lidocaine for local anesthesia. A 5 French sheath was placed in the right femoral artery. Standard diagnostic catheters were used to perform  selective coronary angiography. A pigtail catheter was used to perform a left ventricular angiogram. There were no immediate complications. The patient was taken to the recovery area in stable condition.  Hemodynamic Findings:  Central aortic pressure: 111/62  Left ventricular pressure: 111/8/15  Angiographic Findings:  Left main: No obstructive disease.  Left Anterior Descending Artery: Large caliber vessel that courses to the apex. The ostium has a hazy, 95% stenosis. The proximal and mid vessel is stented. There is 60% restenosis in the mid stented segment. The apical LAD has 99% stenosis as it wraps around the apex. There are several small caliber diagonal branches with minimal plaque, both ostia covered by the LAD stent.  Circumflex Artery: Large caliber vessel. Large caliber, bifurcating intermediate branch with diffuse mild stenosis. The AV groove Circumflex is moderate in caliber with diffuse mild stenosis. No focally obstructive lesions.  Right Coronary Artery: Large, dominant vessel with diffuse 40% proximal stenosis, mid stented segment with 100% occlusion in the mid stented segment. The distal vessel is seen to fill from left to right collaterals.  Left Ventricular Angiogram: LVEF=35-40% with anterior, apical and inferoapical hypokinesis.  Impression:  1. Severe double vessel CAD involving the ostial LAD with total chronic occlusion of the mid RCA in the previously stented segment.  2. Moderate LV systolic dysfunction  3. Unstable angina, primarily dyspnea as anginal component  Recommendations: Mr. Castellano has had multiple stenting procedures over the years and continues to smoke. Now with chronic total occlusion of the large caliber, dominant RCA which is seen to fill from collaterals with good distal targets and severe stenosis ostial LAD. The most favorable method of revascularization appears to be CABG. PCI of the ostial LAD would be feasible but it is unlikely that we could get a good  result with stenting of his RCA given the chronic occlusion in the area of prior stenting. Will consult CT surgery to discuss CABG. Of note, pt has been chronically managed on Plavix. This will be held today. P2Y12 with next blood draw.  Complications: None. The patient tolerated the procedure well.    Assessment/Plan:  He has severe 2-vessel coronary disease with a high grade ostial LAD stenosis and and occluded RCA. His symptoms of exertional fatigue and dyspnea suggest that they are ischemic. I agree that  CABG is the best treatment for this relatively young man who already has multiple stents, an ostial LAD stenosis and an occluded RCA. He has been on Plavix and we will check a P2Y12 assay today to decide about the timing of surgery. If his platelets are not inhibited severely I would plan to proceed with surgery on Thursday or Friday. He could probably go home and come back in on the morning of surgery. If we need to wait 5 days because his platelets are severely inhibited then he could go home and have it done early next week. I discussed the operative procedure with the patient and family including alternatives, benefits and risks; including but not limited to bleeding, blood transfusion, infection, stroke, myocardial infarction, graft failure, heart block requiring a permanent pacemaker, organ dysfunction, and death.  [name] understands and agrees to proceed.   Cam Harnden K 12/21/2013, 2:09 PM

## 2013-12-21 NOTE — Progress Notes (Signed)
VASCULAR LAB PRELIMINARY  PRELIMINARY  PRELIMINARY  PRELIMINARY  Pre-op Cardiac Surgery  Carotid Findings:  Bilateral:  1-39% ICA stenosis.  Vertebral artery flow is antegrade.      Upper Extremity Right Left  Brachial Pressures 146 Triphasic  146 Triphasic   Radial Waveforms Triphasic  Triphasic   Ulnar Waveforms Triphasic  Triphasic   Palmar Arch (Allen's Test) Within normal limits  Within normal limits       Lower  Extremity Right Left  Dorsalis Pedis    Anterior Tibial Bilateral palpable pulses.    Posterior Tibial    Ankle/Brachial Indices        [name], RVT Christy Little, RCS 12/21/2013, 5:42 PM

## 2013-12-21 NOTE — CV Procedure (Signed)
Cardiac Catheterization Operative Report  [name] [id number] 2/16/201512:05 PM Jenkins Rouge, MD  Procedure Performed:  1. Left Heart Catheterization 2. Selective Coronary Angiography 3. Left ventricular angiogram  Operator: Lauree Chandler, MD  Arterial access site:  Right radial artery.   Indication: 58 yo male with history of CAD s/p bare metal stent to RCA in 2002 and 2008 and bare metal stent LAD in 2002 and 2008, ICM (EF ~ 40%), HTN, HL, OSA, GERD, tobacco abuse with recent dyspnea on exertion concerning for angina. Last cath in 2008 demonstrated patent stents in the LAD and RCA. Med Rx was continued at that time. Recent stress myoview with anterior and inferior wall scar with reduced LVEF, no clear ischemia. Cardiac cath today to exclude progression of CAD.                                      Procedure Details: The risks, benefits, complications, treatment options, and expected outcomes were discussed with the patient. The patient and/or family concurred with the proposed plan, giving informed consent. The patient was brought to the cath lab after IV hydration was begun and oral premedication was given. The patient was further sedated with Versed and Fentanyl. The right wrist was assessed with an Allens test which was positive. The right wrist was prepped and draped in a sterile fashion. 1% lidocaine was used for local anesthesia. Using the modified Seldinger access technique I attempted to pass a wire into the right radial artery but the wire could not be advanced beyond his forearm. We then prepped the right groin and used 1% lidocaine for local anesthesia.  A 5 French sheath was placed in the right femoral artery. Standard diagnostic catheters were used to perform selective coronary angiography. A pigtail catheter was used to perform a left ventricular angiogram. There were no immediate complications. The patient was taken to the recovery area in stable condition.     Hemodynamic Findings: Central aortic pressure: 111/62 Left ventricular pressure: 111/8/15  Angiographic Findings:  Left main: No obstructive disease.   Left Anterior Descending Artery: Large caliber vessel that courses to the apex. The ostium has a hazy, 95% stenosis. The proximal and mid vessel is stented. There is 60% restenosis in the mid stented segment. The apical LAD has 99% stenosis as it wraps around the apex. There are several small caliber diagonal branches with minimal plaque, both ostia covered by the LAD stent.   Circumflex Artery: Large caliber vessel. Large caliber, bifurcating intermediate branch with diffuse mild stenosis. The AV groove Circumflex is moderate in caliber with diffuse mild stenosis. No focally obstructive lesions.   Right Coronary Artery: Large, dominant vessel with diffuse 40% proximal stenosis, mid stented segment with 100% occlusion in the mid stented segment. The distal vessel is seen to fill from left to right collaterals.   Left Ventricular Angiogram: LVEF=35-40% with anterior, apical and inferoapical hypokinesis.   Impression: 1. Severe double vessel CAD involving the ostial LAD with total chronic occlusion of the mid RCA in the previously stented segment.  2. Moderate LV systolic dysfunction 3. Unstable angina, primarily dyspnea as anginal component  Recommendations: Mr. Funari has had multiple stenting procedures over the years and continues to smoke. Now with chronic total occlusion of the large caliber, dominant RCA which is seen to fill from collaterals with good distal targets and severe stenosis ostial LAD. The most  favorable method of revascularization appears to be CABG. PCI of the ostial LAD would be feasible but it is unlikely that we could get a good result with stenting of his RCA given the chronic occlusion in the area of prior stenting. Will consult CT surgery to discuss CABG. Of note, pt has been chronically managed on Plavix. This will  be held today. P2Y12 with next blood draw.        Complications:  None. The patient tolerated the procedure well.

## 2013-12-21 NOTE — H&P (View-Only) (Signed)
[address], [place], [place]  [address] Phone: [phone] Fax:  [phone]  Date:  12/14/2013   [name], DOB August 14, 1956, MRN [id number]  PCP:  Isaac Rouge, MD  Cardiologist:  Dr. Jenkins Gill     History of Present Illness: [name] is a 58 y.o. male with a hx of CAD, s/p stent to RCA and LAD, ICM (EF ~ 40%), HTN, HL, OSA, GERD, tobacco abuse.  Last cath in 2008 demonstrated patent stents in the LAD and RCA.  Med Rx was continued.  Last seen by Dr. Johnsie Gill 11/17/13. Blood pressure was uncontrolled. Medications were adjusted. He was placed back on statin therapy.  Patient noted some symptoms of chest pain.  Lexiscan Myoview demonstrated significant inferior and anterior scar but no ischemia. Cardiac MRI is pending to assess for viability. I spoke with Dr. Johnsie Gill this morning who would like the patient set up for cardiac catheterization.  Patient denies chest pain. He notes chronic DOE.  Denies any worsening.  He is NYHA 2b.  No orthopnea, PND, edema.  No syncope.  He fell and bruised his ribs on the left 2 mos ago.  This area still hurts with positional changes.  No significant cough.  No fever.     Recent Labs: 01/01/2013: Creatinine 0.7; Potassium 4.1  Chest X-Ray(12/01/13): IMPRESSION: 1. Mild infiltrate left lung base. Mild pneumonia could present in this fashion. Followup chest x-ray suggested to demonstrate clearing. 2. Probable EKG pads over both upper lobes. Repeat chest x-ray without EKG pads suggested to exclude underlying pulmonary nodule.    Wt Readings from Last 3 Encounters:  12/14/13 143 lb (64.864 kg)  12/01/13 137 lb (62.143 kg)  11/17/13 146 lb (66.225 kg)     Past Medical History  Diagnosis Date  . Mixed hyperlipidemia   . GERD (gastroesophageal reflux disease)   . Hypertension     under control with med., has been on med. > 12 yr.  . 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 Sleep apnea     uses CPAP  nightly  . Dental bridge present     lower  . Ischemic cardiomyopathy     a. Echo 09/2012:  Mod LVH, focal basal hypertrophy, EF 30-35%, mid to dist inf-lat HK, Gr 1 diast dysfn, mild to mod LAE  . CAD (coronary artery disease)     a. s/p prior MI; hx of stent to RCA and LAD;  b. LHC 12/2006:  LM 20%, pLAD 40%, mLAD stent ok, apical LAD 90% (1.5-12mm vessel), oD1 40-50%, AV groove CFX 30%, oOM1 30%, pRCA 30% (multiple), mRCA stent ok with 30-40% ISR, dRCA 30%, dAnt, inf-apical severe HK, EF 40% => med Rx.;      . Hx of cardiovascular stress test     a.  Myoview (02/2010): EF 41%, septal, apical and inf infarct, no ischemia;   b.  Lexiscan Myoview (12/02/13): Large fixed perfusion defect in the RCA and LAD territory, no ischemia, EF 40%.    Current Outpatient Prescriptions  Medication Sig Dispense Refill  . aspirin 325 MG tablet Take 1 tablet (325 mg total) by mouth daily.  10 tablet  0  . atorvastatin (LIPITOR) 20 MG tablet Take 1 tablet (20 mg total) by mouth daily.  90 tablet  3  . carvedilol (COREG) 25 MG tablet Take 1 tablet (25 mg total) by mouth 2 (two) times daily.  60 tablet  11  . clopidogrel (  PLAVIX) 75 MG tablet Take 1 tablet (75 mg total) by mouth daily.  90 tablet  3  . losartan-hydrochlorothiazide (HYZAAR) 100-25 MG per tablet Take 1 tablet by mouth daily.  90 tablet  3  . nitroGLYCERIN (NITROSTAT) 0.4 MG SL tablet Place 1 tablet (0.4 mg total) under the tongue every 5 (five) minutes as needed for chest pain.  25 tablet  4   No current facility-administered medications for this visit.    Allergies:   Review of patient's allergies indicates no known allergies.   Social History:  The patient  reports that he has been smoking Cigarettes.  He has a 20 pack-year smoking history. He has never used smokeless tobacco. He reports that he drinks alcohol. He reports that he does not use illicit drugs.   Family History:  The patient's family history includes Heart disease in his father,  maternal grandfather, maternal grandmother, paternal grandfather, and paternal grandmother.   ROS:  Please see the history of present illness.      All other systems reviewed and negative.   PHYSICAL EXAM: VS:  BP 110/78  Pulse 70  Ht 5\' 8"  (1.727 m)  Wt 143 lb (64.864 kg)  BMI 21.75 kg/m2 Well nourished, well developed, in no acute distress HEENT: normal Neck: no JVD Cardiac:  normal S1, S2; RRR; no murmur Chest:  Lower anterior rib enlargement/prominene (~rib 9) nontender Lungs:  clear to auscultation bilaterally, no wheezing, rhonchi or rales Abd: soft, nontender, no hepatomegaly Ext: no edema Skin: warm and dry Neuro:  CNs 2-12 intact, no focal abnormalities noted  EKG:  NSR, HR 70, PRWP (anteroseptal Q waves), TWI in V3-5, no change from prior tracing.     ASSESSMENT AND PLAN:  1. CAD:  Patient with recent myoview with significant scar.  Dr. Jenkins Gill would like the patient set up for cardiac cath as well as cMRI (to assess viability).  Continue ASA, Plavix, statin.  Risks and benefits of cardiac catheterization have been discussed with the patient.  These include bleeding, infection, kidney damage, stroke, heart attack, death.  The patient understands these risks and is willing to proceed.  2. Ischemic Cardiomyopathy:  Continue beta blocker, ARB.  Check BMET today.  Consider adding Spironolactone.  As noted, patient to undergo cardiac cath and cMRI is pending. 3. Hypertension:  Controlled.  Patient did not stop Ramipril.  Will stop this for now.  He will keep an eye on his BP.  could consider Spironolactone if BP high on ARB and beta blocker alone. 4. Hyperlipidemia:  Continue statin.  5. Abnormal CXR:  Will arrange repeat CXR.  He has no symptoms of pneumonia.  Get CBC with diff with precath labs today.  Question if he fractured a L lower anterior rib. Will see if rib films can be done as well.    6. Tobacco Abuse:  He has been counseled on the importance of quitting in the  past.   7. Disposition:  Proceed with left heart cath and cMRI as planned.   Signed, Richardson Dopp, PA-C  12/14/2013 3:44 PM

## 2013-12-21 NOTE — Progress Notes (Signed)
[id number] Received order pre op. Discussed with pt importance of mobility and IS after surgery. Demonstrated sternal precautions and how to get up and down without use of arms. Gave [facility] booklet and information sheet to pt and family. Encouraged them to watch pre op video. Pt stated wants to go home to wait out plavix washout. Will follow up tomorrow if not discharged.  Graylon Good RN BSN 12/21/2013 3:27 PM

## 2013-12-21 NOTE — Discharge Summary (Signed)
Discharge Summary   Patient ID: [name],  MRN: [id number], DOB/AGE: 58-Jun-1957 58 y.o.  Admit date: 12/21/2013 Discharge date: 12/21/2013  Primary Cardiologist: P. Johnsie Cancel, MD   Discharge Diagnoses Principal Problem:   Unstable angina  **status post catheterization revealing severe LAD and RCA disease-->pending CABG Active Problems:   CAD   Essential hypertension, benign   Tobacco abuse   Mixed hyperlipidemia   Allergies No Known Allergies  Procedures  Cardiac Catheterization 2.16.2015  Left main: No obstructive disease.   Left Anterior Descending Artery: Large caliber vessel that courses to the apex. The ostium has a hazy, 95% stenosis. The proximal and mid vessel is stented. There is 60% restenosis in the mid stented segment. The apical LAD has 99% stenosis as it wraps around the apex. There are several small caliber diagonal branches with minimal plaque, both ostia covered by the LAD stent.   Circumflex Artery: Large caliber vessel. Large caliber, bifurcating intermediate branch with diffuse mild stenosis. The AV groove Circumflex is moderate in caliber with diffuse mild stenosis. No focally obstructive lesions.   Right Coronary Artery: Large, dominant vessel with diffuse 40% proximal stenosis, mid stented segment with 100% occlusion in the mid stented segment. The distal vessel is seen to fill from left to right collaterals.   Left Ventricular Angiogram: LVEF=35-40% with anterior, apical and inferoapical hypokinesis.  _____________   History of Present Illness  58 year old male with prior history of coronary artery disease status post prior RCA and LAD stenting who was recently seen in clinic secondary to recurrent chest pain. A Myoview was performed showing significant inferior and anterior scar without ischemia. Decision was made to pursue diagnostic catheterization.  Hospital Course  Patient underwent cardiac catheterization this morning revealing severe ostial  LAD disease as well as apical LAD disease and a total occlusion of the mid right coronary artery within the previously stented segment. EF was 35-40%. It was felt that patient would benefit most from a surgical opinion and his Plavix was held. P2Y12 testing was performed and showed excellent platelet inhibition with a PRU of 54. Patient was evaluated by thoracic surgery and he was felt to be a suitable candidate for bypass once Plavix washout was complete. This evening, patient has been fairly adamant about being discharged and this has subsequently been arranged. He understands that he will need to contact the thoracic surgery office in the morning to schedule surgery either later this week or early next week.  Discharge Vitals Blood pressure 131/75, pulse 65, temperature 98 F (36.7 C), temperature source Oral, resp. rate 18, height 5\' 7"  (1.702 m), weight 143 lb (64.864 kg), SpO2 98.00%.  Filed Weights   12/21/13 0853  Weight: 143 lb (64.864 kg)    Labs  P2Y12      54  Disposition  Pt is being discharged home today in good condition.  Follow-up Plans & Appointments  Follow-up Information   Follow up with Jenkins Rouge, MD. (we will arrange)    Specialty:  Cardiology   Contact information:   [address] [address] [phone]       Follow up with Gaye Pollack, MD. (Please call office tomorrow to schedule surgery)    Specialty:  Cardiothoracic Surgery   Contact information:   [address] [place] [address] [phone]      Discharge Medications    Medication List    STOP taking these medications       clopidogrel 75  MG tablet  Commonly known as:  PLAVIX      TAKE these medications       aspirin 325 MG tablet  Take 1 tablet (325 mg total) by mouth daily.     atorvastatin 20 MG tablet  Commonly known as:  LIPITOR  Take 1 tablet (20 mg total) by mouth daily.     carvedilol 12.5 MG tablet  Commonly known as:   COREG  Take 12.5 mg by mouth 2 (two) times daily with a meal.     isosorbide mononitrate 30 MG 24 hr tablet  Commonly known as:  IMDUR  Take 1 tablet (30 mg total) by mouth daily.     losartan-hydrochlorothiazide 100-25 MG per tablet  Commonly known as:  HYZAAR  Take 1 tablet by mouth daily.     nitroGLYCERIN 0.4 MG SL tablet  Commonly known as:  NITROSTAT  Place 1 tablet (0.4 mg total) under the tongue every 5 (five) minutes as needed for chest pain.        Outstanding Labs/Studies  none  Duration of Discharge Encounter   Greater than 30 minutes including physician time.  Signed, Murray Hodgkins NP 12/21/2013, 8:39 PM

## 2013-12-21 NOTE — Interval H&P Note (Signed)
History and Physical Interval Note:  12/21/2013 8:46 AM  [name]  has presented today for surgery, with the diagnosis of Chest pain/Low EF  The various methods of treatment have been discussed with the patient and family. After consideration of risks, benefits and other options for treatment, the patient has consented to  Procedure(s): LEFT AND RIGHT HEART CATHETERIZATION WITH CORONARY ANGIOGRAM (N/A) as a surgical intervention .  The patient's history has been reviewed, patient examined, no change in status, stable for surgery.  I have reviewed the patient's chart and labs.  Questions were answered to the patient's satisfaction.     Jenkins Rouge

## 2013-12-21 NOTE — Discharge Instructions (Signed)

## 2013-12-22 ENCOUNTER — Other Ambulatory Visit: Payer: Self-pay | Admitting: *Deleted

## 2013-12-22 ENCOUNTER — Encounter (HOSPITAL_COMMUNITY): Payer: BC Managed Care – PPO

## 2013-12-22 DIAGNOSIS — I2510 Atherosclerotic heart disease of native coronary artery without angina pectoris: Secondary | ICD-10-CM

## 2013-12-22 NOTE — Progress Notes (Signed)
About 2150; patient was discharged. Discharge papers and instructions were given to the patient. Patient expressed understanding of all the instructions. No further action at this time.

## 2013-12-23 ENCOUNTER — Encounter (HOSPITAL_COMMUNITY): Payer: Self-pay | Admitting: Pharmacy Technician

## 2013-12-23 ENCOUNTER — Telehealth: Payer: Self-pay | Admitting: Cardiovascular Disease

## 2013-12-23 ENCOUNTER — Encounter: Payer: Self-pay | Admitting: *Deleted

## 2013-12-23 NOTE — Telephone Encounter (Signed)
Walk In pt Form " FMLA,Letter For Out Of Work" gave to [employer] was Sent to [employer]

## 2013-12-24 ENCOUNTER — Encounter: Payer: Self-pay | Admitting: Cardiology

## 2013-12-29 ENCOUNTER — Other Ambulatory Visit (HOSPITAL_COMMUNITY): Payer: Self-pay | Admitting: *Deleted

## 2013-12-29 NOTE — Pre-Procedure Instructions (Addendum)
[name]  12/29/2013   Your procedure is scheduled on:  01/01/14  Report to [facility] cone short stay admitting at 530 AM.  Call this number if you have problems the morning of surgery: [phone]   Remember:   Do not eat food or drink liquids after midnight.   Take these medicines the morning of surgery with A SIP OF WATER: carvedilol,imdur,aspirin,nitro if needed        STOP all herbel meds, nsaids (aleve,naproxen,advil,ibuprofen) including vitamins   Do not wear jewelry, make-up or nail polish.  Do not wear lotions, powders, or perfumes. You may wear deodorant.  Do not shave 48 hours prior to surgery. Men may shave face and neck.  Do not bring valuables to the hospital.  [facility] is not responsible                  for any belongings or valuables.               Contacts, dentures or bridgework may not be worn into surgery.  Leave suitcase in the car. After surgery it may be brought to your room.  For patients admitted to the hospital, discharge time is determined by your                treatment team.               Patients discharged the day of surgery will not be allowed to drive  home.  Name and phone number of your driver:   Special Instructions:  Special Instructions: [facility] - Preparing for Surgery  Before surgery, you can play an important role.  Because skin is not sterile, your skin needs to be as free of germs as possible.  You can reduce the number of germs on you skin by washing with CHG (chlorahexidine gluconate) soap before surgery.  CHG is an antiseptic cleaner which kills germs and bonds with the skin to continue killing germs even after washing.  Please DO NOT use if you have an allergy to CHG or antibacterial soaps.  If your skin becomes reddened/irritated stop using the CHG and inform your nurse when you arrive at Short Stay.  Do not shave (including legs and underarms) for at least 48 hours prior to the first CHG shower.  You may shave your  face.  Please follow these instructions carefully:   1.  Shower with CHG Soap the night before surgery and the morning of Surgery.  2.  If you choose to wash your hair, wash your hair first as usual with your normal shampoo.  3.  After you shampoo, rinse your hair and body thoroughly to remove the Shampoo.  4.  Use CHG as you would any other liquid soap.  You can apply chg directly  to the skin and wash gently with scrungie or a clean washcloth.  5.  Apply the CHG Soap to your body ONLY FROM THE NECK DOWN.  Do not use on open wounds or open sores.  Avoid contact with your eyes ears, mouth and genitals (private parts).  Wash genitals (private parts)       with your normal soap.  6.  Wash thoroughly, paying special attention to the area where your surgery will be performed.  7.  Thoroughly rinse your body with warm water from the neck down.  8.  DO NOT shower/wash with your normal soap after using and rinsing off the CHG Soap.  9.  Pat yourself dry  with a clean towel.            10.  Wear clean pajamas.            11.  Place clean sheets on your bed the night of your first shower and do not sleep with pets.  Day of Surgery  Do not apply any lotions/deodorants the morning of surgery.  Please wear clean clothes to the hospital/surgery center.   Please read over the following fact sheets that you were given: Pain Booklet, Coughing and Deep Breathing, Blood Transfusion Information, Open Heart Packet, MRSA Information and Surgical Site Infection Prevention

## 2013-12-30 ENCOUNTER — Ambulatory Visit (HOSPITAL_COMMUNITY)
Admission: RE | Admit: 2013-12-30 | Discharge: 2013-12-30 | Disposition: A | Discharge status: Home / Self Care | Payer: BC Managed Care – PPO | Source: Ambulatory Visit | Attending: Surgery | Admitting: Surgery

## 2013-12-30 ENCOUNTER — Encounter (HOSPITAL_COMMUNITY)
Admission: RE | Admit: 2013-12-30 | Discharge: 2013-12-30 | Disposition: A | Discharge status: Home / Self Care | Payer: BC Managed Care – PPO | Source: Ambulatory Visit | Attending: Surgery | Admitting: Surgery

## 2013-12-30 ENCOUNTER — Other Ambulatory Visit (HOSPITAL_COMMUNITY): Payer: BC Managed Care – PPO

## 2013-12-30 ENCOUNTER — Encounter (HOSPITAL_COMMUNITY): Payer: Self-pay

## 2013-12-30 VITALS — BP 92/50 | HR 72 | Temp 97.8°F | Resp 18 | Ht 68.0 in | Wt 145.0 lb

## 2013-12-30 DIAGNOSIS — Z01811 Encounter for preprocedural respiratory examination: Secondary | ICD-10-CM | POA: Insufficient documentation

## 2013-12-30 DIAGNOSIS — I2510 Atherosclerotic heart disease of native coronary artery without angina pectoris: Secondary | ICD-10-CM

## 2013-12-30 HISTORY — DX: Unspecified osteoarthritis, unspecified site: M19.90

## 2013-12-30 HISTORY — DX: Acute myocardial infarction, unspecified: I21.9

## 2013-12-30 LAB — COMPREHENSIVE METABOLIC PANEL
ALT: 22 U/L (ref 0–53)
AST: 19 U/L (ref 0–37)
Albumin: 3.8 g/dL (ref 3.5–5.2)
Alkaline Phosphatase: 71 U/L (ref 39–117)
BUN: 12 mg/dL (ref 6–23)
CO2: 26 mEq/L (ref 19–32)
Calcium: 9.7 mg/dL (ref 8.4–10.5)
Chloride: 102 mEq/L (ref 96–112)
Creatinine, Ser: 0.65 mg/dL (ref 0.50–1.35)
GFR calc Af Amer: >90 mL/min (ref >90)
GFR calc non Af Amer: >90 mL/min (ref >90)
Glucose, Bld: 87 mg/dL (ref 70–99)
Potassium: 4 mEq/L (ref 3.7–5.3)
Sodium: 141 mEq/L (ref 137–147)
Total Bilirubin: 0.5 mg/dL (ref 0.3–1.2)
Total Protein: 7.1 g/dL (ref 6.0–8.3)

## 2013-12-30 LAB — URINALYSIS, ROUTINE W REFLEX MICROSCOPIC
Bilirubin Urine: NEGATIVE
Glucose, UA: NEGATIVE mg/dL
Hgb urine dipstick: NEGATIVE
Ketones, ur: NEGATIVE mg/dL
Leukocytes, UA: NEGATIVE
Nitrite: NEGATIVE
Protein, ur: NEGATIVE mg/dL
Specific Gravity, Urine: 1.025 (ref 1.005–1.030)
Urobilinogen, UA: 1 mg/dL (ref 0.0–1.0)
pH: 6.5 (ref 5.0–8.0)

## 2013-12-30 LAB — ABO/RH: ABO/RH(D): A POS

## 2013-12-30 LAB — PULMONARY FUNCTION TEST
DL/VA % pred: 79 %
DL/VA: 3.58 ml/min/mmHg/L
DLCO cor % pred: 67 %
DLCO cor: 20.02 ml/min/mmHg
DLCO unc % pred: 67 %
DLCO unc: 20.02 ml/min/mmHg
FEF 25-75 Post: 2.2 L/sec
FEF 25-75 Pre: 2.18 L/sec
FEF2575-%Change-Post: 0 %
FEF2575-%Pred-Post: 75 %
FEF2575-%Pred-Pre: 75 %
FEV1-%Change-Post: 0 %
FEV1-%Pred-Post: 86 %
FEV1-%Pred-Pre: 86 %
FEV1-Post: 2.95 L
FEV1-Pre: 2.96 L
FEV1FVC-%Change-Post: -1 %
FEV1FVC-%Pred-Pre: 96 %
FEV6-%Change-Post: 0 %
FEV6-%Pred-Post: 94 %
FEV6-%Pred-Pre: 94 %
FEV6-Post: 4.07 L
FEV6-Pre: 4.05 L
FEV6FVC-%Change-Post: 0 %
FEV6FVC-%Pred-Post: 103 %
FEV6FVC-%Pred-Pre: 104 %
FVC-%Change-Post: 1 %
FVC-%Pred-Post: 91 %
FVC-%Pred-Pre: 89 %
FVC-Post: 4.1 L
FVC-Pre: 4.05 L
Post FEV1/FVC ratio: 72 %
Post FEV6/FVC ratio: 99 %
Pre FEV1/FVC ratio: 73 %
Pre FEV6/FVC Ratio: 100 %
RV % pred: 123 %
RV: 2.59 L
TLC % pred: 100 %
TLC: 6.61 L

## 2013-12-30 LAB — BLOOD GAS, ARTERIAL
Acid-Base Excess: 2.7 mmol/L — ABNORMAL HIGH (ref 0.0–2.0)
Bicarbonate: 26.6 mEq/L — ABNORMAL HIGH (ref 20.0–24.0)
Drawn by: 206361
O2 Saturation: 98.8 %
Patient temperature: 98.6
TCO2: 27.9 mmol/L (ref 0–100)
pCO2 arterial: 40.3 mmHg (ref 35.0–45.0)
pH, Arterial: 7.436 (ref 7.350–7.450)
pO2, Arterial: 101 mmHg — ABNORMAL HIGH (ref 80.0–100.0)

## 2013-12-30 LAB — PROTIME-INR
INR: 0.99 (ref 0.00–1.49)
Prothrombin Time: 12.9 seconds (ref 11.6–15.2)

## 2013-12-30 LAB — CBC
HCT: 46.2 % (ref 39.0–52.0)
Hemoglobin: 16.1 g/dL (ref 13.0–17.0)
MCH: 31.7 pg (ref 26.0–34.0)
MCHC: 34.8 g/dL (ref 30.0–36.0)
MCV: 90.9 fL (ref 78.0–100.0)
Platelets: 203 10*3/uL (ref 150–400)
RBC: 5.08 MIL/uL (ref 4.22–5.81)
RDW: 12.8 % (ref 11.5–15.5)
WBC: 11.8 10*3/uL — ABNORMAL HIGH (ref 4.0–10.5)

## 2013-12-30 LAB — TYPE AND SCREEN
ABO/RH(D): A POS
Antibody Screen: NEGATIVE

## 2013-12-30 LAB — APTT: aPTT: 27 seconds (ref 24–37)

## 2013-12-30 LAB — HEMOGLOBIN A1C
Hgb A1c MFr Bld: 5.3 % (ref <5.7)
Mean Plasma Glucose: 105 mg/dL (ref <117)

## 2013-12-30 LAB — PLATELET INHIBITION P2Y12: Platelet Function  P2Y12: 238 [PRU] (ref 194–418)

## 2013-12-30 LAB — SURGICAL PCR SCREEN
MRSA, PCR: NEGATIVE
Staphylococcus aureus: NEGATIVE

## 2013-12-30 MED ORDER — ALBUTEROL SULFATE (2.5 MG/3ML) 0.083% IN NEBU
2.5000 mg | INHALATION_SOLUTION | Freq: Once | RESPIRATORY_TRACT | Status: AC
  Administered 2013-12-30: 2.5 mg via RESPIRATORY_TRACT

## 2013-12-31 MED ORDER — DEXMEDETOMIDINE HCL IN NACL 400 MCG/100ML IV SOLN
0.1000 ug/kg/h | INTRAVENOUS | Status: AC
  Administered 2014-01-01: 0.3 ug/kg/h via INTRAVENOUS
  Filled 2013-12-31: qty 100

## 2013-12-31 MED ORDER — SODIUM CHLORIDE 0.9 % IV SOLN
INTRAVENOUS | Status: DC
  Filled 2013-12-31: qty 40

## 2013-12-31 MED ORDER — METOPROLOL TARTRATE 12.5 MG HALF TABLET: 12.5000 mg | ORAL_TABLET | Freq: Once | ORAL | Status: DC

## 2013-12-31 MED ORDER — DEXTROSE 5 % IV SOLN
750.0000 mg | INTRAVENOUS | Status: DC
  Filled 2013-12-31: qty 750

## 2013-12-31 MED ORDER — PHENYLEPHRINE HCL 10 MG/ML IJ SOLN
30.0000 ug/min | INTRAVENOUS | Status: DC
  Filled 2013-12-31: qty 2

## 2013-12-31 MED ORDER — SODIUM CHLORIDE 0.9 % IV SOLN
INTRAVENOUS | Status: DC
  Filled 2013-12-31: qty 30

## 2013-12-31 MED ORDER — PLASMA-LYTE 148 IV SOLN
INTRAVENOUS | Status: AC
  Administered 2014-01-01: 07:00:00
  Filled 2013-12-31: qty 2.5

## 2013-12-31 MED ORDER — POTASSIUM CHLORIDE 2 MEQ/ML IV SOLN
80.0000 meq | INTRAVENOUS | Status: DC
  Filled 2013-12-31: qty 40

## 2013-12-31 MED ORDER — DOPAMINE-DEXTROSE 3.2-5 MG/ML-% IV SOLN
2.0000 ug/kg/min | INTRAVENOUS | Status: DC
  Filled 2013-12-31: qty 250

## 2013-12-31 MED ORDER — SODIUM CHLORIDE 0.9 % IV SOLN
INTRAVENOUS | Status: AC
  Administered 2014-01-01: 1 [IU]/h via INTRAVENOUS
  Filled 2013-12-31: qty 1

## 2013-12-31 MED ORDER — EPINEPHRINE HCL 1 MG/ML IJ SOLN
0.5000 ug/min | INTRAVENOUS | Status: DC
  Filled 2013-12-31: qty 4

## 2013-12-31 MED ORDER — VANCOMYCIN HCL 10 G IV SOLR
1250.0000 mg | INTRAVENOUS | Status: AC
  Administered 2014-01-01: 1250 mg via INTRAVENOUS
  Filled 2013-12-31: qty 1250

## 2013-12-31 MED ORDER — MAGNESIUM SULFATE 50 % IJ SOLN
40.0000 meq | INTRAMUSCULAR | Status: DC
  Filled 2013-12-31: qty 10

## 2013-12-31 MED ORDER — NITROGLYCERIN IN D5W 200-5 MCG/ML-% IV SOLN
2.0000 ug/min | INTRAVENOUS | Status: AC
  Administered 2014-01-01: 33 ug/min via INTRAVENOUS
  Filled 2013-12-31: qty 250

## 2013-12-31 MED ORDER — CEFUROXIME SODIUM 1.5 G IJ SOLR
1.5000 g | INTRAMUSCULAR | Status: AC
  Administered 2014-01-01: .75 g via INTRAVENOUS
  Administered 2014-01-01: 1.5 g via INTRAVENOUS
  Filled 2013-12-31 (×2): qty 1.5

## 2014-01-01 ENCOUNTER — Inpatient Hospital Stay (HOSPITAL_COMMUNITY): Payer: BC Managed Care – PPO

## 2014-01-01 ENCOUNTER — Ambulatory Visit (HOSPITAL_COMMUNITY): Payer: BC Managed Care – PPO | Admitting: Anesthesiology

## 2014-01-01 ENCOUNTER — Encounter (HOSPITAL_COMMUNITY): Payer: Self-pay | Admitting: *Deleted

## 2014-01-01 ENCOUNTER — Inpatient Hospital Stay (HOSPITAL_COMMUNITY)
Admission: RE | Admit: 2014-01-01 | Discharge: 2014-01-06 | DRG: 236 | Disposition: A | Discharge status: Home / Self Care | Payer: BC Managed Care – PPO | Source: Ambulatory Visit | Attending: Surgery | Admitting: Surgery

## 2014-01-01 ENCOUNTER — Encounter (HOSPITAL_COMMUNITY)
Admission: RE | Disposition: A | Discharge status: Home / Self Care | Payer: BC Managed Care – PPO | Source: Ambulatory Visit | Attending: Surgery

## 2014-01-01 ENCOUNTER — Encounter (HOSPITAL_COMMUNITY): Payer: BC Managed Care – PPO | Admitting: Anesthesiology

## 2014-01-01 DIAGNOSIS — K219 Gastro-esophageal reflux disease without esophagitis: Secondary | ICD-10-CM | POA: Diagnosis present

## 2014-01-01 DIAGNOSIS — Z9861 Coronary angioplasty status: Secondary | ICD-10-CM

## 2014-01-01 DIAGNOSIS — D62 Acute posthemorrhagic anemia: Secondary | ICD-10-CM | POA: Diagnosis not present

## 2014-01-01 DIAGNOSIS — E782 Mixed hyperlipidemia: Secondary | ICD-10-CM | POA: Diagnosis present

## 2014-01-01 DIAGNOSIS — Z7982 Long term (current) use of aspirin: Secondary | ICD-10-CM

## 2014-01-01 DIAGNOSIS — G4733 Obstructive sleep apnea (adult) (pediatric): Secondary | ICD-10-CM | POA: Diagnosis present

## 2014-01-01 DIAGNOSIS — F17200 Nicotine dependence, unspecified, uncomplicated: Secondary | ICD-10-CM | POA: Diagnosis present

## 2014-01-01 DIAGNOSIS — I2510 Atherosclerotic heart disease of native coronary artery without angina pectoris: Secondary | ICD-10-CM

## 2014-01-01 DIAGNOSIS — Y831 Surgical operation with implant of artificial internal device as the cause of abnormal reaction of the patient, or of later complication, without mention of misadventure at the time of the procedure: Secondary | ICD-10-CM | POA: Diagnosis present

## 2014-01-01 DIAGNOSIS — I2589 Other forms of chronic ischemic heart disease: Secondary | ICD-10-CM | POA: Diagnosis present

## 2014-01-01 DIAGNOSIS — I10 Essential (primary) hypertension: Secondary | ICD-10-CM | POA: Diagnosis present

## 2014-01-01 DIAGNOSIS — Z79899 Other long term (current) drug therapy: Secondary | ICD-10-CM

## 2014-01-01 DIAGNOSIS — T82897A Other specified complication of cardiac prosthetic devices, implants and grafts, initial encounter: Principal | ICD-10-CM | POA: Diagnosis present

## 2014-01-01 DIAGNOSIS — Z7902 Long term (current) use of antithrombotics/antiplatelets: Secondary | ICD-10-CM

## 2014-01-01 DIAGNOSIS — I252 Old myocardial infarction: Secondary | ICD-10-CM

## 2014-01-01 DIAGNOSIS — Z951 Presence of aortocoronary bypass graft: Secondary | ICD-10-CM

## 2014-01-01 DIAGNOSIS — I2582 Chronic total occlusion of coronary artery: Secondary | ICD-10-CM | POA: Diagnosis present

## 2014-01-01 HISTORY — PX: INTRAOPERATIVE TRANSESOPHAGEAL ECHOCARDIOGRAM: SHX5062

## 2014-01-01 HISTORY — PX: CORONARY ARTERY BYPASS GRAFT: SHX141

## 2014-01-01 LAB — CBC
HCT: 34.9 % — ABNORMAL LOW (ref 39.0–52.0)
HCT: 33.8 % — ABNORMAL LOW (ref 39.0–52.0)
Hemoglobin: 11.9 g/dL — ABNORMAL LOW (ref 13.0–17.0)
Hemoglobin: 11.2 g/dL — ABNORMAL LOW (ref 13.0–17.0)
MCH: 31 pg (ref 26.0–34.0)
MCH: 30.7 pg (ref 26.0–34.0)
MCHC: 34.1 g/dL (ref 30.0–36.0)
MCHC: 33.1 g/dL (ref 30.0–36.0)
MCV: 90.9 fL (ref 78.0–100.0)
MCV: 92.6 fL (ref 78.0–100.0)
Platelets: 120 10*3/uL — ABNORMAL LOW (ref 150–400)
Platelets: 144 10*3/uL — ABNORMAL LOW (ref 150–400)
RBC: 3.84 MIL/uL — ABNORMAL LOW (ref 4.22–5.81)
RBC: 3.65 MIL/uL — ABNORMAL LOW (ref 4.22–5.81)
RDW: 13 % (ref 11.5–15.5)
RDW: 13.1 % (ref 11.5–15.5)
WBC: 14.8 10*3/uL — ABNORMAL HIGH (ref 4.0–10.5)
WBC: 18.5 10*3/uL — ABNORMAL HIGH (ref 4.0–10.5)

## 2014-01-01 LAB — POCT I-STAT, CHEM 8
BUN: 13 mg/dL (ref 6–23)
Calcium, Ion: 1.25 mmol/L — ABNORMAL HIGH (ref 1.12–1.23)
Chloride: 103 mEq/L (ref 96–112)
Creatinine, Ser: 0.7 mg/dL (ref 0.50–1.35)
Glucose, Bld: 105 mg/dL — ABNORMAL HIGH (ref 70–99)
HCT: 33 % — ABNORMAL LOW (ref 39.0–52.0)
Hemoglobin: 11.2 g/dL — ABNORMAL LOW (ref 13.0–17.0)
Potassium: 4.4 mEq/L (ref 3.7–5.3)
Sodium: 143 mEq/L (ref 137–147)
TCO2: 25 mmol/L (ref 0–100)

## 2014-01-01 LAB — POCT I-STAT 3, ART BLOOD GAS (G3+)
Acid-Base Excess: 1 mmol/L (ref 0.0–2.0)
Acid-Base Excess: 1 mmol/L (ref 0.0–2.0)
Acid-Base Excess: 3 mmol/L — ABNORMAL HIGH (ref 0.0–2.0)
Acid-base deficit: 1 mmol/L (ref 0.0–2.0)
Bicarbonate: 26 mEq/L — ABNORMAL HIGH (ref 20.0–24.0)
Bicarbonate: 26.5 mEq/L — ABNORMAL HIGH (ref 20.0–24.0)
Bicarbonate: 26.1 mEq/L — ABNORMAL HIGH (ref 20.0–24.0)
Bicarbonate: 25.9 mEq/L — ABNORMAL HIGH (ref 20.0–24.0)
Bicarbonate: 30 mEq/L — ABNORMAL HIGH (ref 20.0–24.0)
O2 Saturation: 100 %
O2 Saturation: 100 %
O2 Saturation: 93 %
O2 Saturation: 97 %
O2 Saturation: 99 %
Patient temperature: 35.3
Patient temperature: 37.9
Patient temperature: 37.6
TCO2: 27 mmol/L (ref 0–100)
TCO2: 28 mmol/L (ref 0–100)
TCO2: 27 mmol/L (ref 0–100)
TCO2: 27 mmol/L (ref 0–100)
TCO2: 32 mmol/L (ref 0–100)
pCO2 arterial: 41.7 mmHg (ref 35.0–45.0)
pCO2 arterial: 46.5 mmHg — ABNORMAL HIGH (ref 35.0–45.0)
pCO2 arterial: 42.5 mmHg (ref 35.0–45.0)
pCO2 arterial: 51.7 mmHg — ABNORMAL HIGH (ref 35.0–45.0)
pCO2 arterial: 60.4 mmHg (ref 35.0–45.0)
pH, Arterial: 7.404 (ref 7.350–7.450)
pH, Arterial: 7.363 (ref 7.350–7.450)
pH, Arterial: 7.389 (ref 7.350–7.450)
pH, Arterial: 7.312 — ABNORMAL LOW (ref 7.350–7.450)
pH, Arterial: 7.306 — ABNORMAL LOW (ref 7.350–7.450)
pO2, Arterial: 345 mmHg — ABNORMAL HIGH (ref 80.0–100.0)
pO2, Arterial: 293 mmHg — ABNORMAL HIGH (ref 80.0–100.0)
pO2, Arterial: 62 mmHg — ABNORMAL LOW (ref 80.0–100.0)
pO2, Arterial: 106 mmHg — ABNORMAL HIGH (ref 80.0–100.0)
pO2, Arterial: 135 mmHg — ABNORMAL HIGH (ref 80.0–100.0)

## 2014-01-01 LAB — PROTIME-INR
INR: 1.39 (ref 0.00–1.49)
Prothrombin Time: 16.7 seconds — ABNORMAL HIGH (ref 11.6–15.2)

## 2014-01-01 LAB — POCT I-STAT 4, (NA,K, GLUC, HGB,HCT)
Glucose, Bld: 90 mg/dL (ref 70–99)
Glucose, Bld: 93 mg/dL (ref 70–99)
Glucose, Bld: 93 mg/dL (ref 70–99)
Glucose, Bld: 108 mg/dL — ABNORMAL HIGH (ref 70–99)
Glucose, Bld: 81 mg/dL (ref 70–99)
HCT: 41 % (ref 39.0–52.0)
HCT: 38 % — ABNORMAL LOW (ref 39.0–52.0)
HCT: 29 % — ABNORMAL LOW (ref 39.0–52.0)
HCT: 32 % — ABNORMAL LOW (ref 39.0–52.0)
HCT: 35 % — ABNORMAL LOW (ref 39.0–52.0)
Hemoglobin: 13.9 g/dL (ref 13.0–17.0)
Hemoglobin: 12.9 g/dL — ABNORMAL LOW (ref 13.0–17.0)
Hemoglobin: 9.9 g/dL — ABNORMAL LOW (ref 13.0–17.0)
Hemoglobin: 10.9 g/dL — ABNORMAL LOW (ref 13.0–17.0)
Hemoglobin: 11.9 g/dL — ABNORMAL LOW (ref 13.0–17.0)
Potassium: 4 mEq/L (ref 3.7–5.3)
Potassium: 4 mEq/L (ref 3.7–5.3)
Potassium: 3.9 mEq/L (ref 3.7–5.3)
Potassium: 4.4 mEq/L (ref 3.7–5.3)
Potassium: 3.5 mEq/L — ABNORMAL LOW (ref 3.7–5.3)
Sodium: 142 mEq/L (ref 137–147)
Sodium: 140 mEq/L (ref 137–147)
Sodium: 136 mEq/L — ABNORMAL LOW (ref 137–147)
Sodium: 138 mEq/L (ref 137–147)
Sodium: 141 mEq/L (ref 137–147)

## 2014-01-01 LAB — CREATININE, SERUM
Creatinine, Ser: 0.62 mg/dL (ref 0.50–1.35)
GFR calc Af Amer: >90 mL/min (ref >90)
GFR calc non Af Amer: >90 mL/min (ref >90)

## 2014-01-01 LAB — HEMOGLOBIN AND HEMATOCRIT, BLOOD
HCT: 32.1 % — ABNORMAL LOW (ref 39.0–52.0)
Hemoglobin: 10.8 g/dL — ABNORMAL LOW (ref 13.0–17.0)

## 2014-01-01 LAB — GLUCOSE, CAPILLARY
Glucose-Capillary: 87 mg/dL (ref 70–99)
Glucose-Capillary: 95 mg/dL (ref 70–99)
Glucose-Capillary: 88 mg/dL (ref 70–99)
Glucose-Capillary: 116 mg/dL — ABNORMAL HIGH (ref 70–99)

## 2014-01-01 LAB — MAGNESIUM: Magnesium: 2.6 mg/dL — ABNORMAL HIGH (ref 1.5–2.5)

## 2014-01-01 LAB — PLATELET COUNT: Platelets: 143 10*3/uL — ABNORMAL LOW (ref 150–400)

## 2014-01-01 LAB — FIBRINOGEN: Fibrinogen: 254 mg/dL (ref 204–475)

## 2014-01-01 LAB — APTT: aPTT: 36 seconds (ref 24–37)

## 2014-01-01 SURGERY — CORONARY ARTERY BYPASS GRAFTING (CABG)
Anesthesia: General | Site: Chest

## 2014-01-01 MED ORDER — LACTATED RINGERS IV SOLN: 500.0000 mL | Freq: Once | INTRAVENOUS | Status: AC | PRN

## 2014-01-01 MED ORDER — MIDAZOLAM HCL 2 MG/2ML IJ SOLN: 2.0000 mg | INTRAMUSCULAR | Status: DC | PRN

## 2014-01-01 MED ORDER — THROMBIN 20000 UNITS EX SOLR
CUTANEOUS | Status: AC
  Filled 2014-01-01: qty 20000

## 2014-01-01 MED ORDER — HEPARIN SODIUM (PORCINE) 1000 UNIT/ML IJ SOLN
INTRAMUSCULAR | Status: AC
  Filled 2014-01-01: qty 1

## 2014-01-01 MED ORDER — PROTAMINE SULFATE 10 MG/ML IV SOLN
INTRAVENOUS | Status: DC | PRN
  Administered 2014-01-01: 20 mg via INTRAVENOUS
  Administered 2014-01-01: 50 mg via INTRAVENOUS
  Administered 2014-01-01: 20 mg via INTRAVENOUS
  Administered 2014-01-01: 30 mg via INTRAVENOUS
  Administered 2014-01-01: 50 mg via INTRAVENOUS
  Administered 2014-01-01: 30 mg via INTRAVENOUS

## 2014-01-01 MED ORDER — LACTATED RINGERS IV SOLN: INTRAVENOUS | Status: DC

## 2014-01-01 MED ORDER — FENTANYL CITRATE 0.05 MG/ML IJ SOLN
INTRAMUSCULAR | Status: AC
  Filled 2014-01-01: qty 5

## 2014-01-01 MED ORDER — INSULIN ASPART 100 UNIT/ML ~~LOC~~ SOLN
0.0000 [IU] | SUBCUTANEOUS | Status: DC
Start: 2014-01-01 — End: 2014-01-02

## 2014-01-01 MED ORDER — HEMOSTATIC AGENTS (NO CHARGE) OPTIME
TOPICAL | Status: DC | PRN
  Administered 2014-01-01: 1 via TOPICAL

## 2014-01-01 MED ORDER — FENTANYL CITRATE 0.05 MG/ML IJ SOLN
INTRAMUSCULAR | Status: DC | PRN
  Administered 2014-01-01: 250 ug via INTRAVENOUS
  Administered 2014-01-01: 100 ug via INTRAVENOUS
  Administered 2014-01-01: 150 ug via INTRAVENOUS
  Administered 2014-01-01: 300 ug via INTRAVENOUS
  Administered 2014-01-01 (×4): 100 ug via INTRAVENOUS
  Administered 2014-01-01 (×2): 150 ug via INTRAVENOUS
  Administered 2014-01-01: 100 ug via INTRAVENOUS
  Administered 2014-01-01: 250 ug via INTRAVENOUS
  Administered 2014-01-01: 150 ug via INTRAVENOUS
  Administered 2014-01-01: 250 ug via INTRAVENOUS

## 2014-01-01 MED ORDER — POTASSIUM CHLORIDE 10 MEQ/50ML IV SOLN: 10.0000 meq | Freq: Once | INTRAVENOUS | Status: DC

## 2014-01-01 MED ORDER — DEXMEDETOMIDINE HCL IN NACL 200 MCG/50ML IV SOLN: 0.1000 ug/kg/h | INTRAVENOUS | Status: DC

## 2014-01-01 MED ORDER — AMINOCAPROIC ACID 250 MG/ML IV SOLN
10.0000 g | INTRAVENOUS | Status: DC | PRN
  Administered 2014-01-01: 5 g/h via INTRAVENOUS

## 2014-01-01 MED ORDER — FAMOTIDINE IN NACL 20-0.9 MG/50ML-% IV SOLN
20.0000 mg | Freq: Two times a day (BID) | INTRAVENOUS | Status: AC
  Administered 2014-01-01: 20 mg via INTRAVENOUS

## 2014-01-01 MED ORDER — PHENYLEPHRINE HCL 10 MG/ML IJ SOLN
20.0000 mg | INTRAMUSCULAR | Status: DC | PRN
  Administered 2014-01-01: 10 ug/min via INTRAVENOUS

## 2014-01-01 MED ORDER — POTASSIUM CHLORIDE 10 MEQ/50ML IV SOLN
10.0000 meq | INTRAVENOUS | Status: AC
  Administered 2014-01-01 (×3): 10 meq via INTRAVENOUS

## 2014-01-01 MED ORDER — PHENYLEPHRINE HCL 10 MG/ML IJ SOLN
0.0000 ug/min | INTRAVENOUS | Status: DC
  Filled 2014-01-01: qty 2

## 2014-01-01 MED ORDER — HEPARIN SODIUM (PORCINE) 1000 UNIT/ML IJ SOLN
INTRAMUSCULAR | Status: DC | PRN
  Administered 2014-01-01: 22000 [IU] via INTRAVENOUS

## 2014-01-01 MED ORDER — VANCOMYCIN HCL IN DEXTROSE 1-5 GM/200ML-% IV SOLN
1000.0000 mg | Freq: Once | INTRAVENOUS | Status: AC
  Administered 2014-01-01: 1000 mg via INTRAVENOUS
  Filled 2014-01-01: qty 200

## 2014-01-01 MED ORDER — ASPIRIN EC 325 MG PO TBEC
325.0000 mg | DELAYED_RELEASE_TABLET | Freq: Every day | ORAL | Status: DC
  Administered 2014-01-02: 325 mg via ORAL
  Filled 2014-01-01 (×2): qty 1

## 2014-01-01 MED ORDER — LACTATED RINGERS IV SOLN
INTRAVENOUS | Status: DC | PRN
  Administered 2014-01-01 (×2): via INTRAVENOUS

## 2014-01-01 MED ORDER — SODIUM CHLORIDE 0.9 % IV SOLN
250.0000 mL | INTRAVENOUS | Status: DC
Start: 2014-01-02 — End: 2014-01-04

## 2014-01-01 MED ORDER — ROCURONIUM BROMIDE 50 MG/5ML IV SOLN
INTRAVENOUS | Status: AC
  Filled 2014-01-01: qty 5

## 2014-01-01 MED ORDER — MAGNESIUM SULFATE 4000MG/100ML IJ SOLN
4.0000 g | Freq: Once | INTRAMUSCULAR | Status: AC
  Administered 2014-01-01: 4 g via INTRAVENOUS
  Filled 2014-01-01: qty 100

## 2014-01-01 MED ORDER — SODIUM CHLORIDE 0.9 % IV SOLN
INTRAVENOUS | Status: DC
  Administered 2014-01-01: 12:00:00 via INTRAVENOUS

## 2014-01-01 MED ORDER — SODIUM CHLORIDE 0.9 % IJ SOLN: 3.0000 mL | INTRAMUSCULAR | Status: DC | PRN

## 2014-01-01 MED ORDER — EPHEDRINE SULFATE 50 MG/ML IJ SOLN
INTRAMUSCULAR | Status: AC
  Filled 2014-01-01: qty 1

## 2014-01-01 MED ORDER — SODIUM CHLORIDE 0.45 % IV SOLN
INTRAVENOUS | Status: DC
  Administered 2014-01-01: 12:00:00 via INTRAVENOUS

## 2014-01-01 MED ORDER — DOCUSATE SODIUM 100 MG PO CAPS
200.0000 mg | ORAL_CAPSULE | Freq: Every day | ORAL | Status: DC
  Administered 2014-01-02 – 2014-01-04 (×3): 200 mg via ORAL
  Filled 2014-01-01 (×3): qty 2

## 2014-01-01 MED ORDER — MORPHINE SULFATE 2 MG/ML IJ SOLN
1.0000 mg | INTRAMUSCULAR | Status: AC | PRN
  Administered 2014-01-01 (×3): 4 mg via INTRAVENOUS
  Administered 2014-01-01: 2 mg via INTRAVENOUS
  Administered 2014-01-01: 4 mg via INTRAVENOUS
  Administered 2014-01-01: 2 mg via INTRAVENOUS

## 2014-01-01 MED ORDER — INSULIN REGULAR BOLUS VIA INFUSION
0.0000 [IU] | Freq: Three times a day (TID) | INTRAVENOUS | Status: DC
  Filled 2014-01-01: qty 10

## 2014-01-01 MED ORDER — SODIUM CHLORIDE 0.9 % IJ SOLN
3.0000 mL | Freq: Two times a day (BID) | INTRAMUSCULAR | Status: DC
  Administered 2014-01-02 – 2014-01-04 (×5): 3 mL via INTRAVENOUS

## 2014-01-01 MED ORDER — ACETAMINOPHEN 650 MG RE SUPP: 650.0000 mg | Freq: Once | RECTAL | Status: AC

## 2014-01-01 MED ORDER — ACETAMINOPHEN 160 MG/5ML PO SOLN
1000.0000 mg | Freq: Four times a day (QID) | ORAL | Status: DC
  Filled 2014-01-01: qty 40

## 2014-01-01 MED ORDER — PROTAMINE SULFATE 10 MG/ML IV SOLN
INTRAVENOUS | Status: AC
  Filled 2014-01-01: qty 25

## 2014-01-01 MED ORDER — MIDAZOLAM HCL 10 MG/2ML IJ SOLN
INTRAMUSCULAR | Status: AC
  Filled 2014-01-01: qty 2

## 2014-01-01 MED ORDER — ONDANSETRON HCL 4 MG/2ML IJ SOLN: 4.0000 mg | Freq: Four times a day (QID) | INTRAMUSCULAR | Status: DC | PRN

## 2014-01-01 MED ORDER — ALBUMIN HUMAN 5 % IV SOLN
INTRAVENOUS | Status: DC | PRN
  Administered 2014-01-01: 11:00:00 via INTRAVENOUS

## 2014-01-01 MED ORDER — SODIUM BICARBONATE 8.4 % IV SOLN
50.0000 meq | Freq: Once | INTRAVENOUS | Status: AC
  Administered 2014-01-01: 50 meq via INTRAVENOUS
  Filled 2014-01-01: qty 50

## 2014-01-01 MED ORDER — ARTIFICIAL TEARS OP OINT
TOPICAL_OINTMENT | OPHTHALMIC | Status: DC | PRN
  Administered 2014-01-01: 1 via OPHTHALMIC

## 2014-01-01 MED ORDER — PANTOPRAZOLE SODIUM 40 MG PO TBEC
40.0000 mg | DELAYED_RELEASE_TABLET | Freq: Every day | ORAL | Status: DC
  Administered 2014-01-03 – 2014-01-04 (×2): 40 mg via ORAL
  Filled 2014-01-01 (×2): qty 1

## 2014-01-01 MED ORDER — THROMBIN 20000 UNITS EX SOLR
OROMUCOSAL | Status: DC | PRN
  Administered 2014-01-01 (×3): via TOPICAL

## 2014-01-01 MED ORDER — DEXTROSE 5 % IV SOLN
1.5000 g | Freq: Two times a day (BID) | INTRAVENOUS | Status: AC
  Administered 2014-01-01 – 2014-01-03 (×4): 1.5 g via INTRAVENOUS
  Filled 2014-01-01 (×4): qty 1.5

## 2014-01-01 MED ORDER — ROCURONIUM BROMIDE 100 MG/10ML IV SOLN
INTRAVENOUS | Status: DC | PRN
  Administered 2014-01-01 (×3): 50 mg via INTRAVENOUS
  Administered 2014-01-01: 100 mg via INTRAVENOUS

## 2014-01-01 MED ORDER — MIDAZOLAM HCL 2 MG/2ML IJ SOLN
INTRAMUSCULAR | Status: AC
  Filled 2014-01-01: qty 2

## 2014-01-01 MED ORDER — POTASSIUM CHLORIDE 10 MEQ/50ML IV SOLN
10.0000 meq | Freq: Once | INTRAVENOUS | Status: AC
  Administered 2014-01-01: 10 meq via INTRAVENOUS

## 2014-01-01 MED ORDER — 0.9 % SODIUM CHLORIDE (POUR BTL) OPTIME
TOPICAL | Status: DC | PRN
  Administered 2014-01-01: 5000 mL

## 2014-01-01 MED ORDER — METOPROLOL TARTRATE 25 MG/10 ML ORAL SUSPENSION
12.5000 mg | Freq: Two times a day (BID) | ORAL | Status: DC
  Filled 2014-01-01 (×7): qty 5

## 2014-01-01 MED ORDER — METOPROLOL TARTRATE 1 MG/ML IV SOLN
2.5000 mg | INTRAVENOUS | Status: DC | PRN
  Administered 2014-01-03: 5 mg via INTRAVENOUS
  Filled 2014-01-01: qty 5

## 2014-01-01 MED ORDER — ATORVASTATIN CALCIUM 20 MG PO TABS
20.0000 mg | ORAL_TABLET | Freq: Every day | ORAL | Status: DC
  Administered 2014-01-01 – 2014-01-05 (×5): 20 mg via ORAL
  Filled 2014-01-01 (×6): qty 1

## 2014-01-01 MED ORDER — NITROGLYCERIN IN D5W 200-5 MCG/ML-% IV SOLN
0.0000 ug/min | INTRAVENOUS | Status: DC
Start: 2014-01-01 — End: 2014-01-02

## 2014-01-01 MED ORDER — PROPOFOL 10 MG/ML IV BOLUS
INTRAVENOUS | Status: DC | PRN
  Administered 2014-01-01: 70 mg via INTRAVENOUS

## 2014-01-01 MED ORDER — MORPHINE SULFATE 2 MG/ML IJ SOLN
2.0000 mg | INTRAMUSCULAR | Status: DC | PRN
  Administered 2014-01-01 – 2014-01-02 (×2): 4 mg via INTRAVENOUS
  Administered 2014-01-02 (×2): 2 mg via INTRAVENOUS
  Administered 2014-01-02 (×2): 4 mg via INTRAVENOUS
  Administered 2014-01-02 – 2014-01-04 (×4): 2 mg via INTRAVENOUS
  Filled 2014-01-01 (×2): qty 2
  Filled 2014-01-01: qty 1
  Filled 2014-01-01 (×3): qty 2
  Filled 2014-01-01: qty 1
  Filled 2014-01-01: qty 2
  Filled 2014-01-01: qty 1
  Filled 2014-01-01 (×3): qty 2
  Filled 2014-01-01: qty 1
  Filled 2014-01-01: qty 2

## 2014-01-01 MED ORDER — MIDAZOLAM HCL 5 MG/5ML IJ SOLN
INTRAMUSCULAR | Status: DC | PRN
  Administered 2014-01-01 (×2): 2 mg via INTRAVENOUS
  Administered 2014-01-01 (×2): 3 mg via INTRAVENOUS
  Administered 2014-01-01 (×2): 2 mg via INTRAVENOUS

## 2014-01-01 MED ORDER — ALBUMIN HUMAN 5 % IV SOLN
250.0000 mL | INTRAVENOUS | Status: AC | PRN
  Administered 2014-01-01 – 2014-01-02 (×4): 250 mL via INTRAVENOUS
  Filled 2014-01-01 (×2): qty 250

## 2014-01-01 MED ORDER — BISACODYL 10 MG RE SUPP: 10.0000 mg | Freq: Every day | RECTAL | Status: DC

## 2014-01-01 MED ORDER — ASPIRIN 81 MG PO CHEW: 324.0000 mg | CHEWABLE_TABLET | Freq: Every day | ORAL | Status: DC

## 2014-01-01 MED ORDER — ROCURONIUM BROMIDE 50 MG/5ML IV SOLN
INTRAVENOUS | Status: AC
  Filled 2014-01-01: qty 2

## 2014-01-01 MED ORDER — SODIUM CHLORIDE 0.9 % IV SOLN
INTRAVENOUS | Status: DC
  Filled 2014-01-01: qty 1

## 2014-01-01 MED ORDER — CHLORHEXIDINE GLUCONATE 4 % EX LIQD
30.0000 mL | CUTANEOUS | Status: DC
  Filled 2014-01-01: qty 30

## 2014-01-01 MED ORDER — ACETAMINOPHEN 160 MG/5ML PO SOLN
650.0000 mg | Freq: Once | ORAL | Status: AC
  Administered 2014-01-01: 650 mg
  Filled 2014-01-01: qty 20.3

## 2014-01-01 MED ORDER — OXYCODONE HCL 5 MG PO TABS
5.0000 mg | ORAL_TABLET | ORAL | Status: DC | PRN
  Administered 2014-01-01 – 2014-01-04 (×13): 10 mg via ORAL
  Administered 2014-01-04: 5 mg via ORAL
  Filled 2014-01-01 (×4): qty 2
  Filled 2014-01-01: qty 1
  Filled 2014-01-01 (×9): qty 2

## 2014-01-01 MED ORDER — SODIUM CHLORIDE 0.9 % IJ SOLN
INTRAMUSCULAR | Status: AC
  Filled 2014-01-01: qty 10

## 2014-01-01 MED ORDER — METOPROLOL TARTRATE 12.5 MG HALF TABLET
12.5000 mg | ORAL_TABLET | Freq: Two times a day (BID) | ORAL | Status: DC
  Administered 2014-01-02 – 2014-01-04 (×5): 12.5 mg via ORAL
  Filled 2014-01-01 (×9): qty 1

## 2014-01-01 MED ORDER — BISACODYL 5 MG PO TBEC
10.0000 mg | DELAYED_RELEASE_TABLET | Freq: Every day | ORAL | Status: DC
  Administered 2014-01-02 – 2014-01-04 (×3): 10 mg via ORAL
  Filled 2014-01-01 (×3): qty 2

## 2014-01-01 MED ORDER — METOCLOPRAMIDE HCL 5 MG/ML IJ SOLN
10.0000 mg | Freq: Four times a day (QID) | INTRAMUSCULAR | Status: AC
  Administered 2014-01-01 – 2014-01-02 (×4): 10 mg via INTRAVENOUS
  Filled 2014-01-01 (×4): qty 2

## 2014-01-01 MED ORDER — ACETAMINOPHEN 500 MG PO TABS
1000.0000 mg | ORAL_TABLET | Freq: Four times a day (QID) | ORAL | Status: DC
  Administered 2014-01-02 – 2014-01-04 (×9): 1000 mg via ORAL
  Filled 2014-01-01 (×14): qty 2

## 2014-01-01 MED FILL — Lidocaine HCl IV Inj 20 MG/ML: INTRAVENOUS | Qty: 5 | Status: AC

## 2014-01-01 MED FILL — Mannitol IV Soln 20%: INTRAVENOUS | Qty: 500 | Status: AC

## 2014-01-01 MED FILL — Heparin Sodium (Porcine) Inj 1000 Unit/ML: INTRAMUSCULAR | Qty: 10 | Status: AC

## 2014-01-01 MED FILL — Electrolyte-R (PH 7.4) Solution: INTRAVENOUS | Qty: 3000 | Status: AC

## 2014-01-01 MED FILL — Sodium Chloride IV Soln 0.9%: INTRAVENOUS | Qty: 2000 | Status: AC

## 2014-01-01 SURGICAL SUPPLY — 99 items
ATTRACTOMAT 16X20 MAGNETIC DRP (DRAPES) ×3 IMPLANT
BAG DECANTER FOR FLEXI CONT (MISCELLANEOUS) ×3 IMPLANT
BANDAGE ELASTIC 4 VELCRO ST LF (GAUZE/BANDAGES/DRESSINGS) ×3 IMPLANT
BANDAGE ELASTIC 6 VELCRO ST LF (GAUZE/BANDAGES/DRESSINGS) ×3 IMPLANT
BANDAGE GAUZE ELAST BULKY 4 IN (GAUZE/BANDAGES/DRESSINGS) ×3 IMPLANT
BASKET HEART (ORDER IN 25'S) (MISCELLANEOUS) ×1
BASKET HEART (ORDER IN 25S) (MISCELLANEOUS) ×2 IMPLANT
BLADE STERNUM SYSTEM 6 (BLADE) ×3 IMPLANT
CANISTER SUCTION 2500CC (MISCELLANEOUS) ×3 IMPLANT
CARDIAC SUCTION (MISCELLANEOUS) ×3 IMPLANT
CATH ROBINSON RED A/P 18FR (CATHETERS) ×6 IMPLANT
CATH THORACIC 28FR (CATHETERS) ×3 IMPLANT
CATH THORACIC 36FR (CATHETERS) ×3 IMPLANT
CATH THORACIC 36FR RT ANG (CATHETERS) ×3 IMPLANT
CLIP TI MEDIUM 24 (CLIP) IMPLANT
CLIP TI WIDE RED SMALL 24 (CLIP) ×3 IMPLANT
COVER SURGICAL LIGHT HANDLE (MISCELLANEOUS) ×3 IMPLANT
CRADLE DONUT ADULT HEAD (MISCELLANEOUS) ×3 IMPLANT
DRAPE CARDIOVASCULAR INCISE (DRAPES) ×2
DRAPE SLUSH/WARMER DISC (DRAPES) ×3 IMPLANT
DRAPE SRG 135X102X78XABS (DRAPES) ×2 IMPLANT
DRSG COVADERM 4X14 (GAUZE/BANDAGES/DRESSINGS) ×3 IMPLANT
ELECT CAUTERY BLADE 6.4 (BLADE) ×3 IMPLANT
ELECT REM PT RETURN 9FT ADLT (ELECTROSURGICAL) ×6
ELECTRODE REM PT RTRN 9FT ADLT (ELECTROSURGICAL) ×4 IMPLANT
GLOVE BIO SURGEON STRL SZ 6 (GLOVE) IMPLANT
GLOVE BIO SURGEON STRL SZ 6.5 (GLOVE) IMPLANT
GLOVE BIO SURGEON STRL SZ7 (GLOVE) IMPLANT
GLOVE BIO SURGEON STRL SZ7.5 (GLOVE) IMPLANT
GLOVE BIOGEL PI IND STRL 6 (GLOVE) ×4 IMPLANT
GLOVE BIOGEL PI IND STRL 6.5 (GLOVE) ×2 IMPLANT
GLOVE BIOGEL PI IND STRL 7.0 (GLOVE) ×8 IMPLANT
GLOVE BIOGEL PI INDICATOR 6 (GLOVE) ×2
GLOVE BIOGEL PI INDICATOR 6.5 (GLOVE) ×1
GLOVE BIOGEL PI INDICATOR 7.0 (GLOVE) ×4
GLOVE EUDERMIC 7 POWDERFREE (GLOVE) ×6 IMPLANT
GLOVE ORTHO TXT STRL SZ7.5 (GLOVE) IMPLANT
GOWN STRL REUS W/ TWL LRG LVL3 (GOWN DISPOSABLE) ×8 IMPLANT
GOWN STRL REUS W/ TWL XL LVL3 (GOWN DISPOSABLE) ×2 IMPLANT
GOWN STRL REUS W/TWL LRG LVL3 (GOWN DISPOSABLE) ×4
GOWN STRL REUS W/TWL XL LVL3 (GOWN DISPOSABLE) ×2
HEMOSTAT POWDER SURGIFOAM 1G (HEMOSTASIS) ×9 IMPLANT
HEMOSTAT SURGICEL 2X14 (HEMOSTASIS) ×3 IMPLANT
INSERT FOGARTY 61MM (MISCELLANEOUS) IMPLANT
INSERT FOGARTY XLG (MISCELLANEOUS) IMPLANT
KIT BASIN OR (CUSTOM PROCEDURE TRAY) ×3 IMPLANT
KIT CATH CPB BARTLE (MISCELLANEOUS) ×3 IMPLANT
KIT ROOM TURNOVER OR (KITS) ×3 IMPLANT
KIT SUCTION CATH 14FR (SUCTIONS) ×3 IMPLANT
KIT VASOVIEW W/TROCAR VH 2000 (KITS) ×3 IMPLANT
NS IRRIG 1000ML POUR BTL (IV SOLUTION) ×18 IMPLANT
PACK OPEN HEART (CUSTOM PROCEDURE TRAY) ×3 IMPLANT
PAD ARMBOARD 7.5X6 YLW CONV (MISCELLANEOUS) ×6 IMPLANT
PAD ELECT DEFIB RADIOL ZOLL (MISCELLANEOUS) ×3 IMPLANT
PENCIL BUTTON HOLSTER BLD 10FT (ELECTRODE) ×3 IMPLANT
PUNCH AORTIC ROTATE 4.0MM (MISCELLANEOUS) IMPLANT
PUNCH AORTIC ROTATE 4.5MM 8IN (MISCELLANEOUS) ×3 IMPLANT
PUNCH AORTIC ROTATE 5MM 8IN (MISCELLANEOUS) IMPLANT
SET CARDIOPLEGIA MPS 5001102 (MISCELLANEOUS) ×3 IMPLANT
SPONGE GAUZE 4X4 12PLY (GAUZE/BANDAGES/DRESSINGS) ×6 IMPLANT
SPONGE GAUZE 4X4 12PLY STER LF (GAUZE/BANDAGES/DRESSINGS) ×3 IMPLANT
SPONGE INTESTINAL PEANUT (DISPOSABLE) IMPLANT
SPONGE LAP 18X18 X RAY DECT (DISPOSABLE) IMPLANT
SPONGE LAP 4X18 X RAY DECT (DISPOSABLE) IMPLANT
SUT BONE WAX W31G (SUTURE) ×3 IMPLANT
SUT MNCRL AB 4-0 PS2 18 (SUTURE) ×3 IMPLANT
SUT PROLENE 3 0 SH DA (SUTURE) IMPLANT
SUT PROLENE 3 0 SH1 36 (SUTURE) ×3 IMPLANT
SUT PROLENE 4 0 RB 1 (SUTURE)
SUT PROLENE 4 0 SH DA (SUTURE) IMPLANT
SUT PROLENE 4-0 RB1 .5 CRCL 36 (SUTURE) IMPLANT
SUT PROLENE 5 0 C 1 36 (SUTURE) IMPLANT
SUT PROLENE 6 0 C 1 30 (SUTURE) ×9 IMPLANT
SUT PROLENE 7 0 BV 1 (SUTURE) IMPLANT
SUT PROLENE 7 0 BV1 MDA (SUTURE) ×3 IMPLANT
SUT PROLENE 8 0 BV175 6 (SUTURE) IMPLANT
SUT SILK  1 MH (SUTURE)
SUT SILK 1 MH (SUTURE) IMPLANT
SUT STEEL 6MS V (SUTURE) ×3 IMPLANT
SUT STEEL STERNAL CCS#1 18IN (SUTURE) IMPLANT
SUT STEEL SZ 6 DBL 3X14 BALL (SUTURE) IMPLANT
SUT VIC AB 1 CTX 36 (SUTURE) ×6
SUT VIC AB 1 CTX36XBRD ANBCTR (SUTURE) ×4 IMPLANT
SUT VIC AB 2-0 CT1 27 (SUTURE) ×2
SUT VIC AB 2-0 CT1 TAPERPNT 27 (SUTURE) ×2 IMPLANT
SUT VIC AB 2-0 CTX 27 (SUTURE) IMPLANT
SUT VIC AB 3-0 SH 27 (SUTURE)
SUT VIC AB 3-0 SH 27X BRD (SUTURE) IMPLANT
SUT VIC AB 3-0 X1 27 (SUTURE) IMPLANT
SUT VICRYL 4-0 PS2 18IN ABS (SUTURE) IMPLANT
SUTURE E-PAK OPEN HEART (SUTURE) ×3 IMPLANT
SYSTEM SAHARA CHEST DRAIN ATS (WOUND CARE) ×3 IMPLANT
TAPE CLOTH SURG 4X10 WHT LF (GAUZE/BANDAGES/DRESSINGS) ×3 IMPLANT
TOWEL OR 17X24 6PK STRL BLUE (TOWEL DISPOSABLE) ×6 IMPLANT
TOWEL OR 17X26 10 PK STRL BLUE (TOWEL DISPOSABLE) ×6 IMPLANT
TRAY FOLEY IC TEMP SENS 14FR (CATHETERS) ×3 IMPLANT
TUBING INSUFFLATION 10FT LAP (TUBING) ×3 IMPLANT
UNDERPAD 30X30 INCONTINENT (UNDERPADS AND DIAPERS) ×3 IMPLANT
WATER STERILE IRR 1000ML POUR (IV SOLUTION) ×6 IMPLANT

## 2014-01-01 NOTE — Progress Notes (Signed)
Patient ID: [name], male   DOB: [birth year], 58 y.o.   MRN: [id number]   SICU Evening Rounds:   Hemodynamically stable  CI = 3.4  Extubated, complains of pain  Urine output good  CT output decreasing. 50 cc last hr  CBC    Component Value Date/Time   WBC 14.8* 01/01/2014 1151   RBC 3.84* 01/01/2014 1151   HGB 11.9* 01/01/2014 1154   HCT 35.0* 01/01/2014 1154   PLT 120* 01/01/2014 1151   MCV 90.9 01/01/2014 1151   MCH 31.0 01/01/2014 1151   MCHC 34.1 01/01/2014 1151   RDW 13.0 01/01/2014 1151   LYMPHSABS 3.8 12/14/2013 1637   MONOABS 0.7 12/14/2013 1637   EOSABS 0.8* 12/14/2013 1637   BASOSABS 0.1 12/14/2013 1637     BMET    Component Value Date/Time   NA 141 01/01/2014 1154   K 3.5* 01/01/2014 1154   CL 102 12/30/2013 1018   CO2 26 12/30/2013 1018   GLUCOSE 81 01/01/2014 1154   BUN 12 12/30/2013 1018   CREATININE 0.65 12/30/2013 1018   CALCIUM 9.7 12/30/2013 1018   GFRNONAA >90 12/30/2013 1018   GFRAA >90 12/30/2013 1018     A/P:  Stable postop course. Continue current plans

## 2014-01-01 NOTE — Op Note (Signed)
CARDIOVASCULAR SURGERY OPERATIVE NOTE  01/01/2014  Surgeon:  Gaye Pollack, MD  First Assistant: Ellwood Handler,  PA-C   Preoperative Diagnosis:  Severe multi-vessel coronary artery disease   Postoperative Diagnosis:  Same   Procedure:  1. Median Sternotomy 2. Extracorporeal circulation 3.   Coronary artery bypass grafting x 2   Left internal mammary graft to the LAD  SVG to PDA   4.   Endoscopic vein harvest from the left leg   Anesthesia:  General Endotracheal   Clinical History/Surgical Indication:   He has a history of hypertension, hyperlipidemia, ongoing 1 ppd smoker with a history of coronary disease and multiple prior PCI's. He had bare metal stenting of the LAD and RCA in 2002 and again in 2008. He has been maintained on Plavix. He has a several month history of exertional dyspnea and fatigue. He recently had a Lexiscan myoview showing significant anterior and inferior scar but no ischemia. Cath today shows 95% ostial LAD stenosis, 60% restenosis in the mid-stented segment, and 99% stenosis distally as it wraps the apex. The LCX has no significant stenosis. The RCA is occluded in the mid-stented segment with filling of the PDA by collaterals from the left. The LVEF is 35-40%. He has severe 2-vessel coronary disease with a high grade ostial LAD stenosis and and occluded RCA. His symptoms of exertional fatigue and dyspnea suggest that they are ischemic. I agree that CABG is the best treatment for this relatively young man who already has multiple stents, an ostial LAD stenosis and an occluded RCA. He has been off Plavix for the past week and a half. I discussed the operative procedure with the patient and family including alternatives, benefits and risks; including but not limited to bleeding, blood transfusion, infection, stroke, myocardial infarction, graft failure, heart block  requiring a permanent pacemaker, organ dysfunction, and death. [name] understands and agrees to proceed.     Preparation:  The patient was seen in the preoperative holding area and the correct patient, correct operation were confirmed with the patient after reviewing the medical record and catheterization. The consent was signed by me. Preoperative antibiotics were given. A pulmonary arterial line and radial arterial line were placed by the anesthesia team. The patient was taken back to the operating room and positioned supine on the operating room table. After being placed under general endotracheal anesthesia by the anesthesia team a foley catheter was placed. The neck, chest, abdomen, and both legs were prepped with betadine soap and solution and draped in the usual sterile manner. A surgical time-out was taken and the correct patient and operative procedure were confirmed with the nursing and anesthesia staff.   Cardiopulmonary Bypass:  A median sternotomy was performed. The pericardium was opened in the midline. Right ventricular function appeared normal. The ascending aorta was of normal size and had no palpable plaque. There were no contraindications to aortic cannulation or cross-clamping. The patient was fully systemically heparinized and the ACT was maintained > 400 sec. The proximal aortic arch was cannulated with a 20 F aortic cannula for arterial inflow. Venous cannulation was performed via the right atrial appendage using a two-staged venous cannula. An antegrade cardioplegia/vent cannula was inserted into the mid-ascending aorta. Aortic occlusion was performed with a single cross-clamp. Systemic cooling to 32 degrees Centigrade and topical cooling of the heart with iced saline were used. Hyperkalemic antegrade cold blood cardioplegia was used to induce diastolic arrest and was then given at about 20 minute intervals throughout  the period of arrest to maintain myocardial  temperature at or below 10 degrees centigrade. A temperature probe was inserted into the interventricular septum and an insulating pad was placed in the pericardium.   Left internal mammary harvest:  The left side of the sternum was retracted using the Rultract retractor. The left internal mammary artery was harvested as a pedicle graft. All side branches were clipped. It was a medium-sized vessel of good quality with excellent blood flow. It was ligated distally and divided. It was sprayed with topical papaverine solution to prevent vasospasm.   Endoscopic vein harvest:  Initially the right greater saphenous vein was exposed through a small incision medial to the right knee. It was small and not felt to be ideal to use as a graft. It was not harvested. The left greater saphenous vein was then harvested endoscopically through a 2 cm incision medial to the left knee. It was harvested from the thigh. It was a medium-sized vein of good quality. The side branches were all ligated with 4-0 silk ties.    Coronary arteries:  The coronary arteries were examined.   LAD:  Diffusely diseased throughout the proximal and mid-vessel. All diagonal branches were small and diseased. The apical LAD was not visible.  LCX:  Moderate diffuse coronary plaque  RCA:  Diffusely diseased with calcified plaque. The PDA was small but graftable. The PL branches were tiny.   Grafts:  1. LIMA to the LAD: 1.75 mm. It was sewn end to side using 8-0 prolene continuous suture. 2. SVG to PDA:  1.6 mm. It was sewn end to side using 7-0 prolene continuous suture.    The proximal vein graft anastomosis was performed to the mid-ascending aorta using continuous 6-0 prolene suture. A graft marker was placed around the proximal anastomosis.43   Completion:  The patient was rewarmed to 37 degrees Centigrade. The clamp was removed from the LIMA pedicle and there was rapid warming of the septum and return of ventricular  fibrillation. The crossclamp was removed with a time of 43 minutes. There was spontaneous return of sinus rhythm. The distal and proximal anastomoses were checked for hemostasis. The position of the grafts was satisfactory. Two temporary epicardial pacing wires were placed on the right atrium and two on the right ventricle. The patient was weaned from CPB without difficulty on no inotropes. CPB time was 59 minutes. Cardiac output was 6 LPM. Heparin was fully reversed with protamine and the aortic and venous cannulas removed. Hemostasis was achieved. Mediastinal and left pleural drainage tubes were placed. The sternum was closed with #6 stainless steel wires. The fascia was closed with continuous # 1 vicryl suture. The subcutaneous tissue was closed with 2-0 vicryl continuous suture. The skin was closed with 3-0 vicryl subcuticular suture. All sponge, needle, and instrument counts were reported correct at the end of the case. Dry sterile dressings were placed over the incisions and around the chest tubes which were connected to pleurevac suction. The patient was then transported to the surgical intensive care unit in critical but stable condition.

## 2014-01-01 NOTE — Brief Op Note (Signed)
01/01/2014  10:11 AM  PATIENT:  [name]  58 y.o. male  PRE-OPERATIVE DIAGNOSIS:  CAD  POST-OPERATIVE DIAGNOSIS:  coronary artery disease  PROCEDURE:  Procedure(s):  CORONARY ARTERY BYPASS GRAFTING  X 2 -LIMA TO DISTAL LAD -SVG TO PDA  ENDOSCOPIC SAPHENOUS VEIN HARVEST LEFT THIGH  INTRAOPERATIVE TRANSESOPHAGEAL ECHOCARDIOGRAM (N/A)  SURGEON:  Surgeon(s) and Role:    * Gaye Pollack, MD - Primary  PHYSICIAN ASSISTANT: Erin Barrett PA-C  ANESTHESIA:   general  EBL:  Total I/O In: -  Out: 200 [Urine:200]  BLOOD ADMINISTERED: CELLSAVER  DRAINS: Mediastinal chest drains, Left Pleural Chest tube   LOCAL MEDICATIONS USED:  NONE  SPECIMEN:  No Specimen  DISPOSITION OF SPECIMEN:  N/A  COUNTS:  YES  TOURNIQUET:  * No tourniquets in log *  DICTATION: .Dragon Dictation  PLAN OF CARE: Admit to inpatient   PATIENT DISPOSITION:  ICU - intubated and hemodynamically stable.   Delay start of Pharmacological VTE agent (>24hrs) due to surgical blood loss or risk of bleeding: yes

## 2014-01-01 NOTE — Procedures (Signed)
Extubation Procedure Note  Patient Details:   Name: [name] DOB: July 28, 1956 MRN: [id number]   Airway Documentation:     Evaluation  O2 sats: stable throughout Complications: No apparent complications Patient did tolerate procedure well. Bilateral Breath Sounds: Diminished Suctioning: Airway Yes  Patient extubated to 4 LNC at 1500. NIF -30.  Patient able to vocalize and clear secretions. RT will continue to monitor.  Saunders Glance 01/01/2014, 3:04 PM

## 2014-01-01 NOTE — Anesthesia Postprocedure Evaluation (Signed)
  Anesthesia Post-op Note  Patient: [name]  Procedure(s) Performed: Procedure(s): Coronary artery bypass graft times two using left internal mammary artery and left leg greater saphenous vein harvested endoscopically. (N/A) INTRAOPERATIVE TRANSESOPHAGEAL ECHOCARDIOGRAM (N/A)  Patient Location: SICU  Anesthesia Type:General  Level of Consciousness: sedated  Airway and Oxygen Therapy: Patient remains intubated per anesthesia plan and Patient placed on Ventilator (see vital sign flow sheet for setting)  Post-op Pain: none  Post-op Assessment: Post-op Vital signs reviewed, Patient's Cardiovascular Status Stable, Respiratory Function Stable, Patent Airway, No signs of Nausea or vomiting and Pain level controlled  Post-op Vital Signs: Reviewed and stable  Complications: No apparent anesthesia complications

## 2014-01-01 NOTE — Progress Notes (Signed)
Utilization Review Completed.Donne Anon T2/27/2015

## 2014-01-01 NOTE — Significant Event (Signed)
MD Bartle made aware of chest tube output and oozing in the introducer site. Dr. Cyndia Bent have been checking up on patient at bedside since OR at multiple hours. Patient had received a unit of platelet since being in SICU. A total of 700cc from CT since OR. This evening, MD gave order for fibrinogen and to monitor CT closely. Reported off to nightshift RN. Vernetta Dizdarevic, Therapist, sports.

## 2014-01-01 NOTE — H&P (Signed)
[address]       ,[place] [address]             [phone]      Cardiothoracic Surgery History and Physical   Reason for Consult: Severe 2-vessel coronary artery disease  Referring Physician: Dr. Lauree Chandler  Primary Cardiologist: Dr. Jenkins Rouge  [name] is an 58 y.o. male.  HPI:  He has a history of hypertension, hyperlipidemia, ongoing 1 ppd smoker with a history of coronary disease and multiple prior PCI's. He had bare metal stenting of the LAD and RCA in 2002 and again in 2008. He has been maintained on Plavix. He has a several month history of exertional dyspnea and fatigue. He recently had a Lexiscan myoview showing significant anterior and inferior scar but no ischemia. Cath today shows 95% ostial LAD stenosis, 60% restenosis in the mid-stented segment, and 99% stenosis distally as it wraps the apex. The LCX has no significant stenosis. The RCA is occluded in the mid-stented segment with filling of the PDA by collaterals from the left. The LVEF is 35-40%.  Past Medical History   Diagnosis  Date   .  Mixed hyperlipidemia    .  GERD (gastroesophageal reflux disease)    .  Hypertension      under control with med., has been on med. > 12 yr.   .  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  Sleep apnea      uses CPAP nightly   .  Dental bridge present      lower   .  Ischemic cardiomyopathy      a. Echo 09/2012: Mod LVH, focal basal hypertrophy, EF 30-35%, mid to dist inf-lat HK, Gr 1 diast dysfn, mild to mod LAE   .  CAD (coronary artery disease)      a. s/p prior MI; hx of stent to RCA and LAD; b. LHC 12/2006: LM 20%, pLAD 40%, mLAD stent ok, apical LAD 90% (1.5-15mm vessel), oD1 40-50%, AV groove CFX 30%, oOM1 30%, pRCA 30% (multiple), mRCA stent ok with 30-40% ISR, dRCA 30%, dAnt, inf-apical severe HK, EF 40% => med Rx.;   .  Hx of cardiovascular stress test      a. Myoview (02/2010): EF 41%, septal, apical and inf  infarct, no ischemia; b. Lexiscan Myoview (12/02/13): Large fixed perfusion defect in the RCA and LAD territory, no ischemia, EF 40%.    Past Surgical History   Procedure  Laterality  Date   .  Knee arthroscopy       left knee x 1, right knee x 2   .  Coronary angioplasty with stent placement   05/22/2001     PCI - RCA - LAD   .  Coronary angioplasty with stent placement   11/11/2006     PCI - BMS - LAD   .  Coronary angioplasty with stent placement   11/15/2006     PCI - BMS - RCA   .  Coronary angioplasty with stent placement   03/16/2010   .  Cardiac catheterization   06/27/2001; 12/13/2006; 02/20/2010   .  Open reduction internal fixation (orif) scaphoid with iliac crest bone graft   10/15/2012     Procedure: OPEN REDUCTION INTERNAL FIXATION (ORIF) SCAPHOID WITH ILIAC CREST BONE GRAFT; Surgeon: Schuyler Amor, MD; Location: [facility]; Service: Orthopedics; Laterality: Right; No iliac creast bone graft  taken, used graft from radius    Family History   Problem  Relation  Age of Onset   .  Heart disease  Father    .  Heart disease  Maternal Grandmother    .  Heart disease  Maternal Grandfather    .  Heart disease  Paternal Grandmother    .  Heart disease  Paternal Grandfather    Social History: reports that he has been smoking Cigarettes. He has a 20 pack-year smoking history. He has never used smokeless tobacco. He reports that he drinks alcohol. He reports that he does not use illicit drugs.  Allergies: No Known Allergies  Medications:  I have reviewed the patient's current medications.  Prior to Admission:  Prescriptions prior to admission   Medication  Sig  Dispense  Refill   .  aspirin 325 MG tablet  Take 1 tablet (325 mg total) by mouth daily.  10 tablet  0   .  atorvastatin (LIPITOR) 20 MG tablet  Take 1 tablet (20 mg total) by mouth daily.  90 tablet  3   .  carvedilol (COREG) 12.5 MG tablet  Take 12.5 mg by mouth 2 (two) times daily with a meal.     .   clopidogrel (PLAVIX) 75 MG tablet  Take 1 tablet (75 mg total) by mouth daily.  90 tablet  3   .  losartan-hydrochlorothiazide (HYZAAR) 100-25 MG per tablet  Take 1 tablet by mouth daily.  90 tablet  3   .  nitroGLYCERIN (NITROSTAT) 0.4 MG SL tablet  Place 1 tablet (0.4 mg total) under the tongue every 5 (five) minutes as needed for chest pain.  25 tablet  4   Scheduled:  Continuous:  .  sodium chloride  300 mL (12/21/13 1334)   PRN:  No results found for this or any previous visit (from the past 48 hour(s)).  No results found.  Review of Systems  Constitutional: Positive for malaise/fatigue. Negative for fever, chills, weight loss and diaphoresis.  HENT: Negative.  Eyes: Negative.  Respiratory: Positive for shortness of breath. Negative for cough.  Cardiovascular: Negative for chest pain, palpitations, orthopnea, leg swelling and PND.  Gastrointestinal: Negative.  Genitourinary: Negative.  Musculoskeletal: Negative.  Skin: Negative.  Neurological: Negative.  Endo/Heme/Allergies: Negative.  Psychiatric/Behavioral: Negative.  Blood pressure 126/69, pulse 57, temperature 97.7 F (36.5 C), temperature source Oral, resp. rate 18, height 5\' 7"  (1.702 m), weight 64.864 kg (143 lb), SpO2 100.00%.  Physical Exam  Constitutional: He is oriented to person, place, and time. He appears well-developed and well-nourished. No distress.  HENT:  Head: Normocephalic and atraumatic.  Mouth/Throat: Oropharynx is clear and moist.  Eyes: EOM are normal. Pupils are equal, round, and reactive to light.  Neck: Normal range of motion. Neck supple. No JVD present. No thyromegaly present.  Cardiovascular: Normal rate, regular rhythm, normal heart sounds and intact distal pulses.  No murmur heard.  Respiratory: Effort normal and breath sounds normal. No respiratory distress. He has no rales.  GI: Soft. Bowel sounds are normal. He exhibits no distension and no mass. There is no tenderness.  Musculoskeletal:  Normal range of motion. He exhibits no edema.  Lymphadenopathy:  He has no cervical adenopathy.  Neurological: He is alert and oriented to person, place, and time. He has normal strength. No cranial nerve deficit or sensory deficit.  Skin: Skin is warm and dry.  Psychiatric: He has a normal mood and affect.  Cardiac Catheterization Operative Report  Isaac  CHUKWUEBUKA Gill  TF:[id number]  2/16/201512:05 PM  Jenkins Rouge, MD  Procedure Performed:  1. Left Heart Catheterization 2. Selective Coronary Angiography 3. Left ventricular angiogram Operator: Lauree Chandler, MD  Arterial access site: Right radial artery.  Indication: 58 yo male with history of CAD s/p bare metal stent to RCA in 2002 and 2008 and bare metal stent LAD in 2002 and 2008, ICM (EF ~ 40%), HTN, HL, OSA, GERD, tobacco abuse with recent dyspnea on exertion concerning for angina. Last cath in 2008 demonstrated patent stents in the LAD and RCA. Med Rx was continued at that time. Recent stress myoview with anterior and inferior wall scar with reduced LVEF, no clear ischemia. Cardiac cath today to exclude progression of CAD.  Procedure Details:  The risks, benefits, complications, treatment options, and expected outcomes were discussed with the patient. The patient and/or family concurred with the proposed plan, giving informed consent. The patient was brought to the cath lab after IV hydration was begun and oral premedication was given. The patient was further sedated with Versed and Fentanyl. The right wrist was assessed with an Allens test which was positive. The right wrist was prepped and draped in a sterile fashion. 1% lidocaine was used for local anesthesia. Using the modified Seldinger access technique I attempted to pass a wire into the right radial artery but the wire could not be advanced beyond his forearm. We then prepped the right groin and used 1% lidocaine for local anesthesia. A 5 French sheath was placed in the right femoral  artery. Standard diagnostic catheters were used to perform selective coronary angiography. A pigtail catheter was used to perform a left ventricular angiogram. There were no immediate complications. The patient was taken to the recovery area in stable condition.  Hemodynamic Findings:  Central aortic pressure: 111/62  Left ventricular pressure: 111/8/15  Angiographic Findings:  Left main: No obstructive disease.  Left Anterior Descending Artery: Large caliber vessel that courses to the apex. The ostium has a hazy, 95% stenosis. The proximal and mid vessel is stented. There is 60% restenosis in the mid stented segment. The apical LAD has 99% stenosis as it wraps around the apex. There are several small caliber diagonal branches with minimal plaque, both ostia covered by the LAD stent.  Circumflex Artery: Large caliber vessel. Large caliber, bifurcating intermediate branch with diffuse mild stenosis. The AV groove Circumflex is moderate in caliber with diffuse mild stenosis. No focally obstructive lesions.  Right Coronary Artery: Large, dominant vessel with diffuse 40% proximal stenosis, mid stented segment with 100% occlusion in the mid stented segment. The distal vessel is seen to fill from left to right collaterals.  Left Ventricular Angiogram: LVEF=35-40% with anterior, apical and inferoapical hypokinesis.  Impression:  1. Severe double vessel CAD involving the ostial LAD with total chronic occlusion of the mid RCA in the previously stented segment.  2. Moderate LV systolic dysfunction  3. Unstable angina, primarily dyspnea as anginal component  Recommendations: Mr. Dehoog has had multiple stenting procedures over the years and continues to smoke. Now with chronic total occlusion of the large caliber, dominant RCA which is seen to fill from collaterals with good distal targets and severe stenosis ostial LAD. The most favorable method of revascularization appears to be CABG. PCI of the ostial LAD  would be feasible but it is unlikely that we could get a good result with stenting of his RCA given the chronic occlusion in the area of prior stenting. Will consult CT surgery to discuss  CABG. Of note, pt has been chronically managed on Plavix. This will be held today. P2Y12 with next blood draw.  Complications: None. The patient tolerated the procedure well.  Assessment/Plan:  He has severe 2-vessel coronary disease with a high grade ostial LAD stenosis and and occluded RCA. His symptoms of exertional fatigue and dyspnea suggest that they are ischemic. I agree that CABG is the best treatment for this relatively young man who already has multiple stents, an ostial LAD stenosis and an occluded RCA. He has been off Plavix for the past week and a half. I discussed the operative procedure with the patient and family including alternatives, benefits and risks; including but not limited to bleeding, blood transfusion, infection, stroke, myocardial infarction, graft failure, heart block requiring a permanent pacemaker, organ dysfunction, and death. Gwendolyn Grant understands and agrees to proceed.   Gaye Pollack

## 2014-01-01 NOTE — Anesthesia Preprocedure Evaluation (Addendum)
Anesthesia Evaluation  Patient identified by MRN, date of birth, ID band Patient awake    Reviewed: Allergy & Precautions, H&P , NPO status , Patient's Chart, lab work & pertinent test results, reviewed documented beta blocker date and time   Airway Mallampati: II TM Distance: >3 FB Neck ROM: full    Dental  (+) Teeth Intact Lower bridge with metal exposed on right second tooth.:   Pulmonary shortness of breath, sleep apnea and Continuous Positive Airway Pressure Ventilation , Current Smoker,  breath sounds clear to auscultation        Cardiovascular hypertension, Pt. on medications and Pt. on home beta blockers + angina with exertion + CAD, + Past MI and + Cardiac Stents Rhythm:regular Rate:Normal  Severe 2 vessel CAD. Multiple prior PCI's. He had bare metal stenting of the LAD and RCA in 2002 and again in 2008. He has been maintained on Plavix. He has a several month history of exertional dyspnea and fatigue. He recently had a Lexiscan myoview showing significant anterior and inferior scar but no ischemia. Cath  shows 95% ostial LAD stenosis, 60% restenosis in the mid-stented segment, and 99% stenosis distally as it wraps the apex. The LCX has no significant stenosis. The RCA is occluded in the mid-stented segment with filling of the PDA by collaterals from the left. The LVEF is 35-40%.       Neuro/Psych PSYCHIATRIC DISORDERS negative neurological ROS  negative psych ROS   GI/Hepatic Neg liver ROS, GERD-  Medicated and Controlled,  Endo/Other  negative endocrine ROS  Renal/GU negative Renal ROS  negative genitourinary   Musculoskeletal   Abdominal   Peds  Hematology negative hematology ROS (+)   Anesthesia Other Findings See surgeon's H&P   Reproductive/Obstetrics negative OB ROS                         Anesthesia Physical Anesthesia Plan  ASA: III  Anesthesia Plan: General   Post-op Pain  Management:    Induction: Intravenous  Airway Management Planned: Oral ETT  Additional Equipment: Arterial line, CVP, PA Cath, TEE and Ultrasound Guidance Line Placement  Intra-op Plan:   Post-operative Plan: Post-operative intubation/ventilation  Informed Consent: I have reviewed the patients History and Physical, chart, labs and discussed the procedure including the risks, benefits and alternatives for the proposed anesthesia with the patient or authorized representative who has indicated his/her understanding and acceptance.   Dental Advisory Given  Plan Discussed with: CRNA, Surgeon and Anesthesiologist  Anesthesia Plan Comments:        Anesthesia Quick Evaluation

## 2014-01-01 NOTE — Anesthesia Procedure Notes (Signed)
Procedures   The patient was identified and consent obtained.  TO was performed, and full barrier precautions were used.  The skin was anesthetized with lidocaine.  Once the vein was located with the 22 ga. needle using ultrasound guidance , the wire was inserted into the vein.  The wire location was confirmed with ultrasound.  The insertion site was dilated and the introducer was carefully inserted and sutured in place. The PAC was checked, and floated into the PA.  Once in the PA, the catheter was secured. The patient tolerated the procedure well.  CXR was ordered for PACU. Start: 1093 End: 2355 J. Tedra Senegal, MD

## 2014-01-01 NOTE — Interval H&P Note (Signed)
History and Physical Interval Note:  01/01/2014 7:31 AM  [name]  has presented today for surgery, with the diagnosis of CAD  The various methods of treatment have been discussed with the patient and family. After consideration of risks, benefits and other options for treatment, the patient has consented to  Procedure(s): CORONARY ARTERY BYPASS GRAFTING (CABG) (N/A) INTRAOPERATIVE TRANSESOPHAGEAL ECHOCARDIOGRAM (N/A) as a surgical intervention .  The patient's history has been reviewed, patient examined, no change in status, stable for surgery.  I have reviewed the patient's chart and labs.  Questions were answered to the patient's satisfaction.     Gaye Pollack

## 2014-01-01 NOTE — Transfer of Care (Signed)
Immediate Anesthesia Transfer of Care Note  Patient: [name]  Procedure(s) Performed: Procedure(s): Coronary artery bypass graft times two using left internal mammary artery and left leg greater saphenous vein harvested endoscopically. (N/A) INTRAOPERATIVE TRANSESOPHAGEAL ECHOCARDIOGRAM (N/A)  Patient Location: ICU  Anesthesia Type:General  Level of Consciousness: Patient remains intubated per anesthesia plan  Airway & Oxygen Therapy: Patient remains intubated per anesthesia plan and Patient placed on Ventilator (see vital sign flow sheet for setting)  Post-op Assessment: Report given to PACU RN and Post -op Vital signs reviewed and stable  Post vital signs: Reviewed and stable  Complications: No apparent anesthesia complications

## 2014-01-02 ENCOUNTER — Inpatient Hospital Stay (HOSPITAL_COMMUNITY): Payer: BC Managed Care – PPO

## 2014-01-02 LAB — CBC
HCT: 29.4 % — ABNORMAL LOW (ref 39.0–52.0)
HCT: 32 % — ABNORMAL LOW (ref 39.0–52.0)
Hemoglobin: 9.6 g/dL — ABNORMAL LOW (ref 13.0–17.0)
Hemoglobin: 10.7 g/dL — ABNORMAL LOW (ref 13.0–17.0)
MCH: 30.3 pg (ref 26.0–34.0)
MCH: 31.1 pg (ref 26.0–34.0)
MCHC: 32.7 g/dL (ref 30.0–36.0)
MCHC: 33.4 g/dL (ref 30.0–36.0)
MCV: 92.7 fL (ref 78.0–100.0)
MCV: 93 fL (ref 78.0–100.0)
Platelets: 113 10*3/uL — ABNORMAL LOW (ref 150–400)
Platelets: 122 10*3/uL — ABNORMAL LOW (ref 150–400)
RBC: 3.17 MIL/uL — ABNORMAL LOW (ref 4.22–5.81)
RBC: 3.44 MIL/uL — ABNORMAL LOW (ref 4.22–5.81)
RDW: 13 % (ref 11.5–15.5)
RDW: 13 % (ref 11.5–15.5)
WBC: 10.3 10*3/uL (ref 4.0–10.5)
WBC: 13.7 10*3/uL — ABNORMAL HIGH (ref 4.0–10.5)

## 2014-01-02 LAB — POCT I-STAT, CHEM 8
BUN: 14 mg/dL (ref 6–23)
Calcium, Ion: 1.21 mmol/L (ref 1.12–1.23)
Chloride: 96 mEq/L (ref 96–112)
Creatinine, Ser: 0.7 mg/dL (ref 0.50–1.35)
Glucose, Bld: 89 mg/dL (ref 70–99)
HCT: 32 % — ABNORMAL LOW (ref 39.0–52.0)
Hemoglobin: 10.9 g/dL — ABNORMAL LOW (ref 13.0–17.0)
Potassium: 4.2 mEq/L (ref 3.7–5.3)
Sodium: 135 mEq/L — ABNORMAL LOW (ref 137–147)
TCO2: 30 mmol/L (ref 0–100)

## 2014-01-02 LAB — CREATININE, SERUM
Creatinine, Ser: 0.59 mg/dL (ref 0.50–1.35)
GFR calc Af Amer: >90 mL/min (ref >90)
GFR calc non Af Amer: >90 mL/min (ref >90)

## 2014-01-02 LAB — GLUCOSE, CAPILLARY
Glucose-Capillary: 94 mg/dL (ref 70–99)
Glucose-Capillary: 81 mg/dL (ref 70–99)
Glucose-Capillary: 81 mg/dL (ref 70–99)
Glucose-Capillary: 75 mg/dL (ref 70–99)
Glucose-Capillary: 97 mg/dL (ref 70–99)

## 2014-01-02 LAB — PREPARE PLATELET PHERESIS: Unit division: 0

## 2014-01-02 LAB — MAGNESIUM
Magnesium: 2.1 mg/dL (ref 1.5–2.5)
Magnesium: 2 mg/dL (ref 1.5–2.5)

## 2014-01-02 LAB — BASIC METABOLIC PANEL
BUN: 10 mg/dL (ref 6–23)
CO2: 27 mEq/L (ref 19–32)
Calcium: 8.1 mg/dL — ABNORMAL LOW (ref 8.4–10.5)
Chloride: 103 mEq/L (ref 96–112)
Creatinine, Ser: 0.52 mg/dL (ref 0.50–1.35)
GFR calc Af Amer: >90 mL/min (ref >90)
GFR calc non Af Amer: >90 mL/min (ref >90)
Glucose, Bld: 98 mg/dL (ref 70–99)
Potassium: 4.2 mEq/L (ref 3.7–5.3)
Sodium: 139 mEq/L (ref 137–147)

## 2014-01-02 MED ORDER — INSULIN ASPART 100 UNIT/ML ~~LOC~~ SOLN: 0.0000 [IU] | SUBCUTANEOUS | Status: DC

## 2014-01-02 MED ORDER — KETOROLAC TROMETHAMINE 15 MG/ML IJ SOLN
15.0000 mg | Freq: Four times a day (QID) | INTRAMUSCULAR | Status: DC | PRN
  Administered 2014-01-02 – 2014-01-03 (×4): 15 mg via INTRAVENOUS
  Filled 2014-01-02 (×4): qty 1

## 2014-01-02 NOTE — Progress Notes (Signed)
1 Day Post-Op Procedure(s) (LRB): Coronary artery bypass graft times two using left internal mammary artery and left leg greater saphenous vein harvested endoscopically. (N/A) INTRAOPERATIVE TRANSESOPHAGEAL ECHOCARDIOGRAM (N/A) Subjective: sore  Objective: Vital signs in last 24 hours: Temp:  [95.7 F (35.4 C)-100.2 F (37.9 C)] 99 F (37.2 C) (02/28 0800) Pulse Rate:  [61-98] 77 (02/28 0800) Cardiac Rhythm:  [-] Atrial paced (02/28 0800) Resp:  [10-27] 23 (02/28 0800) BP: (82-107)/(53-74) 102/62 mmHg (02/28 0800) SpO2:  [96 %-100 %] 100 % (02/28 0800) Arterial Line BP: (79-131)/(47-80) 102/62 mmHg (02/28 0800) FiO2 (%):  [40 %-50 %] 40 % (02/27 1434) Weight:  [68 kg (149 lb 14.6 oz)] 68 kg (149 lb 14.6 oz) (02/28 0500)  Hemodynamic parameters for last 24 hours: PAP: (12-37)/(5-19) 33/17 mmHg CO:  [3.7 L/min-6.5 L/min] 3.8 L/min CI:  [2.1 L/min/m2-3.6 L/min/m2] 2.1 L/min/m2  Intake/Output from previous day: 02/27 0701 - 02/28 0700 In: 5605.1 [P.O.:200; I.V.:2843.1; Blood:662; IV Piggyback:1900] Out: 1497 [Urine:2915; Blood:750; Chest Tube:1250] Intake/Output this shift: Total I/O In: 47.5 [I.V.:47.5] Out: 75 [Urine:50; Chest Tube:25]  General appearance: alert and cooperative Neurologic: intact Heart: regular rate and rhythm, S1, S2 normal, no murmur, click, rub or gallop Lungs: clear to auscultation bilaterally Extremities: extremities normal, atraumatic, no cyanosis or edema Wound: dressing dry  Lab Results:  Recent Labs  01/01/14 1815 01/02/14 0330  WBC 18.5* 10.3  HGB 11.2* 9.6*  HCT 33.8* 29.4*  PLT 144* 113*   BMET:  Recent Labs  12/30/13 1018  01/01/14 1811 01/01/14 1815 01/02/14 0330  NA 141  < > 143  --  139  K 4.0  < > 4.4  --  4.2  CL 102  --  103  --  103  CO2 26  --   --   --  27  GLUCOSE 87  < > 105*  --  98  BUN 12  --  13  --  10  CREATININE 0.65  --  0.70 0.62 0.52  CALCIUM 9.7  --   --   --  8.1*  < > = values in this interval not  displayed.  PT/INR:  Recent Labs  01/01/14 1151  LABPROT 16.7*  INR 1.39   ABG    Component Value Date/Time   PHART 7.306* 01/01/2014 1603   HCO3 30.0* 01/01/2014 1603   TCO2 25 01/01/2014 1811   ACIDBASEDEF 1.0 01/01/2014 1456   O2SAT 99.0 01/01/2014 1603   CBG (last 3)   Recent Labs  01/01/14 1508 01/01/14 1945 01/02/14 0408  GLUCAP 88 116* 94   CXR: clear  ECG: NSR, old anteroseptal infarct  Assessment/Plan: S/P Procedure(s) (LRB): Coronary artery bypass graft times two using left internal mammary artery and left leg greater saphenous vein harvested endoscopically. (N/A) INTRAOPERATIVE TRANSESOPHAGEAL ECHOCARDIOGRAM (N/A) Hemodynamically stable. Wean off neo Keep chest tubes in this am and get OOB. Should be able to remove later. Remove swan and A-line. Expected acute blood loss anemia: observe. See progression orders.   LOS: 1 day    BARTLE,BRYAN K 01/02/2014

## 2014-01-02 NOTE — Progress Notes (Signed)
Patient ID: [name], male   DOB: Jul 20, 1956, 58 y.o.   MRN: [id number]  SICU Evening Rounds:  Hemodynamically stable.  Urine output good  CT output 100 cc per hr this afternoon but thin serosanguinous.cbc BMET    Component Value Date/Time   NA 135* 01/02/2014 1708   K 4.2 01/02/2014 1708   CL 96 01/02/2014 1708   CO2 27 01/02/2014 0330   GLUCOSE 89 01/02/2014 1708   BUN 14 01/02/2014 1708   CREATININE 0.70 01/02/2014 1708   CALCIUM 8.1* 01/02/2014 0330   GFRNONAA >90 01/02/2014 1700   GFRAA >90 01/02/2014 1700   CBC    Component Value Date/Time   WBC 13.7* 01/02/2014 1700   RBC 3.44* 01/02/2014 1700   HGB 10.9* 01/02/2014 1708   HCT 32.0* 01/02/2014 1708   PLT 122* 01/02/2014 1700   MCV 93.0 01/02/2014 1700   MCH 31.1 01/02/2014 1700   MCHC 33.4 01/02/2014 1700   RDW 13.0 01/02/2014 1700   LYMPHSABS 3.8 12/14/2013 1637   MONOABS 0.7 12/14/2013 1637   EOSABS 0.8* 12/14/2013 1637   BASOSABS 0.1 12/14/2013 1637

## 2014-01-03 ENCOUNTER — Inpatient Hospital Stay (HOSPITAL_COMMUNITY): Payer: BC Managed Care – PPO

## 2014-01-03 LAB — BASIC METABOLIC PANEL
BUN: 16 mg/dL (ref 6–23)
CO2: 26 mEq/L (ref 19–32)
Calcium: 8.5 mg/dL (ref 8.4–10.5)
Chloride: 99 mEq/L (ref 96–112)
Creatinine, Ser: 0.57 mg/dL (ref 0.50–1.35)
GFR calc Af Amer: >90 mL/min (ref >90)
GFR calc non Af Amer: >90 mL/min (ref >90)
Glucose, Bld: 93 mg/dL (ref 70–99)
Potassium: 4.6 mEq/L (ref 3.7–5.3)
Sodium: 139 mEq/L (ref 137–147)

## 2014-01-03 LAB — CBC
HCT: 29.6 % — ABNORMAL LOW (ref 39.0–52.0)
Hemoglobin: 10 g/dL — ABNORMAL LOW (ref 13.0–17.0)
MCH: 30.9 pg (ref 26.0–34.0)
MCHC: 33.8 g/dL (ref 30.0–36.0)
MCV: 91.4 fL (ref 78.0–100.0)
Platelets: 120 10*3/uL — ABNORMAL LOW (ref 150–400)
RBC: 3.24 MIL/uL — ABNORMAL LOW (ref 4.22–5.81)
RDW: 12.8 % (ref 11.5–15.5)
WBC: 13.7 10*3/uL — ABNORMAL HIGH (ref 4.0–10.5)

## 2014-01-03 LAB — GLUCOSE, CAPILLARY
Glucose-Capillary: 92 mg/dL (ref 70–99)
Glucose-Capillary: 103 mg/dL — ABNORMAL HIGH (ref 70–99)
Glucose-Capillary: 90 mg/dL (ref 70–99)
Glucose-Capillary: 95 mg/dL (ref 70–99)

## 2014-01-03 MED ORDER — HYDRALAZINE HCL 20 MG/ML IJ SOLN
10.0000 mg | INTRAMUSCULAR | Status: DC | PRN
  Administered 2014-01-03: 10 mg via INTRAVENOUS
  Filled 2014-01-03: qty 1

## 2014-01-03 MED ORDER — FUROSEMIDE 10 MG/ML IJ SOLN
40.0000 mg | Freq: Once | INTRAMUSCULAR | Status: AC
  Administered 2014-01-03: 40 mg via INTRAVENOUS
  Filled 2014-01-03: qty 4

## 2014-01-03 MED ORDER — LOSARTAN POTASSIUM 50 MG PO TABS
50.0000 mg | ORAL_TABLET | Freq: Every day | ORAL | Status: DC
  Administered 2014-01-03 – 2014-01-05 (×3): 50 mg via ORAL
  Filled 2014-01-03 (×4): qty 1

## 2014-01-03 NOTE — Progress Notes (Signed)
2 Days Post-Op Procedure(s) (LRB): Coronary artery bypass graft times two using left internal mammary artery and left leg greater saphenous vein harvested endoscopically. (N/A) INTRAOPERATIVE TRANSESOPHAGEAL ECHOCARDIOGRAM (N/A) Subjective:  No complaints  Objective: Vital signs in last 24 hours: Temp:  [98.1 F (36.7 C)-99.3 F (37.4 C)] 98.2 F (36.8 C) (03/01 0748) Pulse Rate:  [59-80] 59 (03/01 0800) Cardiac Rhythm:  [-] Sinus bradycardia (03/01 0800) Resp:  [12-34] 15 (03/01 0800) BP: (111-161)/(63-92) 120/72 mmHg (03/01 0800) SpO2:  [93 %-98 %] 96 % (03/01 0800) Weight:  [68.1 kg (150 lb 2.1 oz)] 68.1 kg (150 lb 2.1 oz) (03/01 0600)  Hemodynamic parameters for last 24 hours:    Intake/Output from previous day: 02/28 0701 - 03/01 0700 In: 736.5 [P.O.:120; I.V.:516.5; IV Piggyback:100] Out: 1735 [Urine:930; Chest Tube:805] Intake/Output this shift: Total I/O In: 20 [I.V.:20] Out: 160 [Urine:60; Chest Tube:100]  General appearance: alert and cooperative Neurologic: intact Heart: regular rate and rhythm, S1, S2 normal, no murmur, click, rub or gallop Lungs: clear to auscultation bilaterally Extremities: extremities normal, atraumatic, no cyanosis or edema Wound: incision ok Chest tube output thin serosanguinous, not bloody Lab Results:  Recent Labs  01/02/14 1700 01/02/14 1708 01/03/14 0400  WBC 13.7*  --  13.7*  HGB 10.7* 10.9* 10.0*  HCT 32.0* 32.0* 29.6*  PLT 122*  --  120*   BMET:  Recent Labs  01/02/14 0330  01/02/14 1708 01/03/14 0400  NA 139  --  135* 139  K 4.2  --  4.2 4.6  CL 103  --  96 99  CO2 27  --   --  26  GLUCOSE 98  --  89 93  BUN 10  --  14 16  CREATININE 0.52  < > 0.70 0.57  CALCIUM 8.1*  --   --  8.5  < > = values in this interval not displayed.  PT/INR:  Recent Labs  01/01/14 1151  LABPROT 16.7*  INR 1.39   ABG    Component Value Date/Time   PHART 7.306* 01/01/2014 1603   HCO3 30.0* 01/01/2014 1603   TCO2 30 01/02/2014  1708   ACIDBASEDEF 1.0 01/01/2014 1456   O2SAT 99.0 01/01/2014 1603   CBG (last 3)   Recent Labs  01/03/14 0017 01/03/14 0412 01/03/14 0746  GLUCAP 90 92 95    Assessment/Plan: S/P Procedure(s) (LRB): Coronary artery bypass graft times two using left internal mammary artery and left leg greater saphenous vein harvested endoscopically. (N/A) INTRAOPERATIVE TRANSESOPHAGEAL ECHOCARDIOGRAM (N/A)  He is hypertensive. HR is 60's on lopressor at 12.5 bid. Will resume some Losartan.  Chest tube output still too high to remove the tubes although it is fairly thin, non-clotting. I don't think there is active bleeding. Will hold ASA and Toradol for now  Expected acute blood loss anemia: observe.   Continue IS and ambulation.   LOS: 2 days    Gilford Raid K 01/03/2014

## 2014-01-04 ENCOUNTER — Encounter (HOSPITAL_COMMUNITY): Payer: Self-pay | Admitting: Surgery

## 2014-01-04 LAB — BASIC METABOLIC PANEL
BUN: 13 mg/dL (ref 6–23)
CO2: 26 mEq/L (ref 19–32)
Calcium: 8.5 mg/dL (ref 8.4–10.5)
Chloride: 96 mEq/L (ref 96–112)
Creatinine, Ser: 0.47 mg/dL — ABNORMAL LOW (ref 0.50–1.35)
GFR calc Af Amer: >90 mL/min (ref >90)
GFR calc non Af Amer: >90 mL/min (ref >90)
Glucose, Bld: 107 mg/dL — ABNORMAL HIGH (ref 70–99)
Potassium: 3.8 mEq/L (ref 3.7–5.3)
Sodium: 134 mEq/L — ABNORMAL LOW (ref 137–147)

## 2014-01-04 LAB — CBC
HCT: 28.6 % — ABNORMAL LOW (ref 39.0–52.0)
Hemoglobin: 9.9 g/dL — ABNORMAL LOW (ref 13.0–17.0)
MCH: 31.1 pg (ref 26.0–34.0)
MCHC: 34.6 g/dL (ref 30.0–36.0)
MCV: 89.9 fL (ref 78.0–100.0)
Platelets: 129 10*3/uL — ABNORMAL LOW (ref 150–400)
RBC: 3.18 MIL/uL — ABNORMAL LOW (ref 4.22–5.81)
RDW: 12.6 % (ref 11.5–15.5)
WBC: 15.8 10*3/uL — ABNORMAL HIGH (ref 4.0–10.5)

## 2014-01-04 MED ORDER — MOVING RIGHT ALONG BOOK
Freq: Once | Status: AC
  Administered 2014-01-04: 11:00:00
  Filled 2014-01-04: qty 1

## 2014-01-04 MED ORDER — OXYCODONE HCL 5 MG PO TABS
5.0000 mg | ORAL_TABLET | ORAL | Status: DC | PRN
  Administered 2014-01-04: 10 mg via ORAL
  Filled 2014-01-04: qty 2

## 2014-01-04 MED ORDER — ONDANSETRON HCL 4 MG/2ML IJ SOLN
4.0000 mg | Freq: Four times a day (QID) | INTRAMUSCULAR | Status: DC | PRN
Start: 2014-01-04 — End: 2014-01-06

## 2014-01-04 MED ORDER — TRAMADOL HCL 50 MG PO TABS
50.0000 mg | ORAL_TABLET | ORAL | Status: DC | PRN
  Administered 2014-01-04: 100 mg via ORAL
  Filled 2014-01-04: qty 2

## 2014-01-04 MED ORDER — ASPIRIN EC 81 MG PO TBEC
81.0000 mg | DELAYED_RELEASE_TABLET | Freq: Every day | ORAL | Status: DC
Start: 2014-01-04 — End: 2014-01-06
  Administered 2014-01-04 – 2014-01-05 (×2): 81 mg via ORAL
  Filled 2014-01-04 (×3): qty 1

## 2014-01-04 MED ORDER — FAMOTIDINE 20 MG PO TABS
20.0000 mg | ORAL_TABLET | Freq: Two times a day (BID) | ORAL | Status: DC
  Administered 2014-01-04 – 2014-01-05 (×4): 20 mg via ORAL
  Filled 2014-01-04 (×6): qty 1

## 2014-01-04 MED ORDER — SODIUM CHLORIDE 0.9 % IV SOLN: 250.0000 mL | INTRAVENOUS | Status: DC | PRN

## 2014-01-04 MED ORDER — ONDANSETRON HCL 4 MG PO TABS: 4.0000 mg | ORAL_TABLET | Freq: Four times a day (QID) | ORAL | Status: DC | PRN

## 2014-01-04 MED ORDER — BISACODYL 10 MG RE SUPP
10.0000 mg | Freq: Every day | RECTAL | Status: DC | PRN
Start: 2014-01-04 — End: 2014-01-06

## 2014-01-04 MED ORDER — SODIUM CHLORIDE 0.9 % IJ SOLN
3.0000 mL | Freq: Two times a day (BID) | INTRAMUSCULAR | Status: DC
  Administered 2014-01-04 – 2014-01-05 (×3): 3 mL via INTRAVENOUS

## 2014-01-04 MED ORDER — DOCUSATE SODIUM 100 MG PO CAPS
200.0000 mg | ORAL_CAPSULE | Freq: Every day | ORAL | Status: DC
  Filled 2014-01-04 (×2): qty 2

## 2014-01-04 MED ORDER — BISACODYL 5 MG PO TBEC
10.0000 mg | DELAYED_RELEASE_TABLET | Freq: Every day | ORAL | Status: DC | PRN
Start: 2014-01-04 — End: 2014-01-06

## 2014-01-04 MED ORDER — SODIUM CHLORIDE 0.9 % IJ SOLN: 3.0000 mL | INTRAMUSCULAR | Status: DC | PRN

## 2014-01-04 MED FILL — Heparin Sodium (Porcine) Inj 1000 Unit/ML: INTRAMUSCULAR | Qty: 30 | Status: AC

## 2014-01-04 MED FILL — Potassium Chloride Inj 2 mEq/ML: INTRAVENOUS | Qty: 40 | Status: AC

## 2014-01-04 MED FILL — Magnesium Sulfate Inj 50%: INTRAMUSCULAR | Qty: 10 | Status: AC

## 2014-01-04 NOTE — Progress Notes (Signed)
Patient ambulated prior to transfer to [facility]. Also patient ambulated 350 feet x1 assist on room air. Patient tolerated well and back in room call bell within reach. Will monitor patient . Grabiela Wohlford, Bettina Gavia RN

## 2014-01-04 NOTE — Plan of Care (Signed)
Problem: Phase I - Pre-Op Goal: Point person for discharge identified Outcome: Completed/Met Date Met:  01/04/14  Wife will be able to assist patient upon discharge.     

## 2014-01-04 NOTE — Progress Notes (Signed)
3 Days Post-Op Procedure(s) (LRB): Coronary artery bypass graft times two using left internal mammary artery and left leg greater saphenous vein harvested endoscopically. (N/A) INTRAOPERATIVE TRANSESOPHAGEAL ECHOCARDIOGRAM (N/A) Subjective: Sore but otherwise ok  Objective: Vital signs in last 24 hours: Temp:  [98.1 F (36.7 C)-98.9 F (37.2 C)] 98.1 F (36.7 C) (03/02 0727) Pulse Rate:  [59-100] 73 (03/02 0700) Cardiac Rhythm:  [-] Normal sinus rhythm (03/02 0400) Resp:  [15-26] 19 (03/02 0700) BP: (97-208)/(59-117) 106/59 mmHg (03/02 0700) SpO2:  [94 %-100 %] 97 % (03/02 0700) Weight:  [64.5 kg (142 lb 3.2 oz)] 64.5 kg (142 lb 3.2 oz) (03/02 0600)  Hemodynamic parameters for last 24 hours:    Intake/Output from previous day: 03/01 0701 - 03/02 0700 In: 400 [P.O.:300; I.V.:100] Out: 2365 [Urine:1935; Chest Tube:430] Intake/Output this shift:    General appearance: alert and cooperative Heart: regular rate and rhythm, S1, S2 normal, no murmur, click, rub or gallop Lungs: clear to auscultation bilaterally Extremities: extremities normal, atraumatic, no cyanosis or edema Wound: incision ok  Lab Results:  Recent Labs  01/03/14 0400 01/04/14 0245  WBC 13.7* 15.8*  HGB 10.0* 9.9*  HCT 29.6* 28.6*  PLT 120* 129*   BMET:  Recent Labs  01/03/14 0400 01/04/14 0245  NA 139 134*  K 4.6 3.8  CL 99 96  CO2 26 26  GLUCOSE 93 107*  BUN 16 13  CREATININE 0.57 0.47*  CALCIUM 8.5 8.5    PT/INR:  Recent Labs  01/01/14 1151  LABPROT 16.7*  INR 1.39   ABG    Component Value Date/Time   PHART 7.306* 01/01/2014 1603   HCO3 30.0* 01/01/2014 1603   TCO2 30 01/02/2014 1708   ACIDBASEDEF 1.0 01/01/2014 1456   O2SAT 99.0 01/01/2014 1603   CBG (last 3)   Recent Labs  01/03/14 0017 01/03/14 0412 01/03/14 0746  GLUCAP 90 92 95    Assessment/Plan: S/P Procedure(s) (LRB): Coronary artery bypass graft times two using left internal mammary artery and left leg greater  saphenous vein harvested endoscopically. (N/A) INTRAOPERATIVE TRANSESOPHAGEAL ECHOCARDIOGRAM (N/A) Mobilize d/c tubes/lines Plan for transfer to step-down: see transfer orders   LOS: 3 days    Stacye Noori K 01/04/2014

## 2014-01-05 ENCOUNTER — Inpatient Hospital Stay (HOSPITAL_COMMUNITY): Payer: BC Managed Care – PPO

## 2014-01-05 DIAGNOSIS — Z0271 Encounter for disability determination: Secondary | ICD-10-CM

## 2014-01-05 LAB — BASIC METABOLIC PANEL
BUN: 10 mg/dL (ref 6–23)
CO2: 28 mEq/L (ref 19–32)
Calcium: 8.7 mg/dL (ref 8.4–10.5)
Chloride: 100 mEq/L (ref 96–112)
Creatinine, Ser: 0.6 mg/dL (ref 0.50–1.35)
GFR calc Af Amer: >90 mL/min (ref >90)
GFR calc non Af Amer: >90 mL/min (ref >90)
Glucose, Bld: 100 mg/dL — ABNORMAL HIGH (ref 70–99)
Potassium: 3.7 mEq/L (ref 3.7–5.3)
Sodium: 139 mEq/L (ref 137–147)

## 2014-01-05 LAB — CBC
HCT: 28.8 % — ABNORMAL LOW (ref 39.0–52.0)
Hemoglobin: 9.7 g/dL — ABNORMAL LOW (ref 13.0–17.0)
MCH: 30.6 pg (ref 26.0–34.0)
MCHC: 33.7 g/dL (ref 30.0–36.0)
MCV: 90.9 fL (ref 78.0–100.0)
Platelets: 188 10*3/uL (ref 150–400)
RBC: 3.17 MIL/uL — ABNORMAL LOW (ref 4.22–5.81)
RDW: 12.8 % (ref 11.5–15.5)
WBC: 11.6 10*3/uL — ABNORMAL HIGH (ref 4.0–10.5)

## 2014-01-05 NOTE — Progress Notes (Signed)
CARDIAC REHAB PHASE I   PRE:  Rate/Rhythm: 80 SR    BP: sitting 110/81    SaO2: 97 RA  MODE:  Ambulation: 550 ft   POST:  Rate/Rhythm: 110 ST    BP: sitting 119/80     SaO2: 98 RA  Tolerated very well. Anxious to go home. Moving independently without device. Ed completed. Motivated to quit smoking. Interested in [facility] and will send referral to [facility]. [phone]   Josephina Shih [place] CES, ACSM 01/05/2014 12:21 PM

## 2014-01-05 NOTE — Discharge Instructions (Signed)
Coronary Artery Bypass Grafting, Care After °Refer to this sheet in the next few weeks. These instructions provide you with information on caring for yourself after your procedure. Your health care provider may also give you more specific instructions. Your treatment has been planned according to current medical practices, but problems sometimes occur. Call your health care provider if you have any problems or questions after your procedure. °WHAT TO EXPECT AFTER THE PROCEDURE °Recovery from surgery will be different for everyone. Some people feel well after 3 or 4 weeks, while for others it takes longer. After your procedure, it is typical to have the following: °· Nausea and a lack of appetite.   °· Constipation. °· Weakness and fatigue.   °· Depression or irritability.   °· Pain or discomfort at your incision site. °HOME CARE INSTRUCTIONS °· Only take over-the-counter or prescription medicines as directed by your health care provider. Take all medicines exactly as directed. Do not stop taking medicines or start any new medicines without first checking with your health care provider.   °· Take your pulse as directed by your health care provider. °· Perform deep breathing as directed by your health care provider. If you were given a device called an incentive spirometer, use it to practice deep breathing several times a day. Support your chest with a pillow or your arms when you take deep breaths or cough. °· Keep incision areas clean, dry, and protected. Remove or change any bandages (dressings) only as directed by your health care provider. You may have skin adhesive strips over the incision areas. Do not take the strips off. They will fall off on their own. °· Check incision areas daily for any swelling, redness, or drainage. °· If incisions were made in your legs, do the following: °· Avoid crossing your legs.   °· Avoid sitting for long periods of time. Change positions every 30 minutes.   °· Elevate your legs  when you are sitting.   °· Wear compression stockings as directed by your health care provider. These stockings help keep blood clots from forming in your legs. °· Take showers once your health care provider approves. Until then, only take sponge baths. Pat incisions dry. Do not rub incisions with a washcloth or towel. Do not take tub baths or go swimming until your health care provider approves. °· Eat foods that are high in fiber, such as raw fruits and vegetables, whole grains, beans, and nuts. Meats should be lean cut. Avoid canned, processed, and fried foods. °· Drink enough fluids to keep your urine clear or pale yellow. °· Weigh yourself every day. This helps identify if you are retaining fluid that may make your heart and lungs work harder.   °· Rest and limit activity as directed by your health care provider. You may be instructed to: °· Stop any activity at once if you have chest pain, shortness of breath, irregular heartbeats, or dizziness. Get help right away if you have any of these symptoms. °· Move around frequently for short periods or take short walks as directed by your health care provider. Increase your activities gradually. You may need physical therapy or cardiac rehabilitation to help strengthen your muscles and build your endurance. °· Avoid lifting, pushing, or pulling anything heavier than 10 lb (4.5 kg) for at least 6 weeks after surgery. °· Do not drive until your health care provider approves.  °· Ask your health care provider when you may return to work and resume sexual activity. °· Follow up with your health care provider as   directed.   °SEEK MEDICAL CARE IF: °· You have swelling, redness, increasing pain, or drainage at the site of an incision.   °· You develop a fever.   °· You have swelling in your ankles or legs.   °· You have pain in your legs.   °· You have weight gain of 2 or more pounds a day. °· You are nauseous or vomit. °· You have diarrhea.  °SEEK IMMEDIATE MEDICAL CARE  IF: °· You have chest pain that goes to your jaw or arms. °· You have shortness of breath.   °· You have a fast or irregular heartbeat.   °· You notice a "clicking" in your breastbone (sternum) when you move.   °· You have numbness or weakness in your arms or legs. °· You feel dizzy or lightheaded.   °MAKE SURE YOU: °· Understand these instructions. °· Will watch your condition. °· Will get help right away if you are not doing well or get worse. °Document Released: 05/11/2005 Document Revised: 06/24/2013 Document Reviewed: 03/31/2013 °ExitCare® Patient Information ©2014 [employer]. ° °Endoscopic Saphenous Vein Harvesting °Care After °Refer to this sheet in the next few weeks. These instructions provide you with information on caring for yourself after your procedure. Your caregiver may also give you more specific instructions. Your treatment has been planned according to current medical practices, but problems sometimes occur. Call your caregiver if you have any problems or questions after your procedure. °HOME CARE INSTRUCTIONS °Medicine °· Take whatever pain medicine your surgeon prescribes. Follow the directions carefully. Do not take over-the-counter pain medicine unless your surgeon says it is okay. Some pain medicine can cause bleeding problems for several weeks after surgery. °· Follow your surgeon's instructions about driving. You will probably not be permitted to drive after heart surgery. °· Take any medicines your surgeon prescribes. Any medicines you took before your heart surgery should be checked with your caregiver before you start taking them again. °Wound care °· Ask your surgeon how long you should keep wearing your elastic bandage or stocking. °· Check the area around your surgical cuts (incisions) whenever your bandages (dressings) are changed. Look for any redness or swelling. °· You will need to return to have the stitches (sutures) or staples taken out. Ask your surgeon when to do  that. °· Ask your surgeon when you can shower or bathe. °Activity °· Try to keep your legs raised when you are sitting. °· Do any exercises your caregivers have given you. These may include deep breathing exercises, coughing, walking, or other exercises. °SEEK MEDICAL CARE IF: °· You have any questions about your medicines. °· You have more leg pain, especially if your pain medicine stops working. °· New or growing bruises develop on your leg. °· Your leg swells, feels tight, or becomes red. °· You have numbness in your leg. °SEEK IMMEDIATE MEDICAL CARE IF: °· Your pain gets much worse. °· Blood or fluid leaks from any of the incisions. °· Your incisions become warm, swollen, or red. °· You have chest pain. °· You have trouble breathing. °· You have a fever. °· You have more pain near your leg incision. °MAKE SURE YOU: °· Understand these instructions. °· Will watch your condition. °· Will get help right away if you are not doing well or get worse. °Document Released: 07/04/2011 Document Revised: 01/14/2012 Document Reviewed: 07/04/2011 °ExitCare® Patient Information ©2014 [employer]. ° ° °

## 2014-01-05 NOTE — Progress Notes (Signed)
Pt up ambulating in hallway with family at this time; no needs voiced; will cont. To monitor.
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
## 2014-01-05 NOTE — Progress Notes (Signed)
Pt up ambulating independently at this time; pt to d/c home tomorrow per Dr. Cyndia Bent; will cont. To monitor.

## 2014-01-05 NOTE — Progress Notes (Addendum)
      [address]       [place] [address]             [phone]      4 Days Post-Op Procedure(s) (LRB): Coronary artery bypass graft times two using left internal mammary artery and left leg greater saphenous vein harvested endoscopically. (N/A) INTRAOPERATIVE TRANSESOPHAGEAL ECHOCARDIOGRAM (N/A)  Subjective:  [name] has no complaints this morning.  He states he feels great and is ready to go home.  Objective: Vital signs in last 24 hours: Temp:  [97.6 F (36.4 C)-99.1 F (37.3 C)] 99.1 F (37.3 C) (03/03 0437) Pulse Rate:  [56-85] 80 (03/03 0437) Cardiac Rhythm:  [-] Normal sinus rhythm (03/02 1920) Resp:  [18-30] 18 (03/03 0437) BP: (115-131)/(66-79) 118/71 mmHg (03/03 0437) SpO2:  [95 %-100 %] 97 % (03/03 0437) Weight:  [142 lb 3.2 oz (64.5 kg)] 142 lb 3.2 oz (64.5 kg) (03/03 0437)  Intake/Output from previous day: 03/02 0701 - 03/03 0700 In: 240 [P.O.:240] Out: -   General appearance: alert, cooperative and no distress Heart: regular rate and rhythm Lungs: clear to auscultation bilaterally Abdomen: soft, non-tender; bowel sounds normal; no masses,  no organomegaly Extremities: edema trace Wound: clean and dry  Lab Results:  Recent Labs  01/04/14 0245 01/05/14 0346  WBC 15.8* 11.6*  HGB 9.9* 9.7*  HCT 28.6* 28.8*  PLT 129* 188   BMET:  Recent Labs  01/04/14 0245 01/05/14 0346  NA 134* 139  K 3.8 3.7  CL 96 100  CO2 26 28  GLUCOSE 107* 100*  BUN 13 10  CREATININE 0.47* 0.60  CALCIUM 8.5 8.7    PT/INR: No results found for this basename: LABPROT, INR,  in the last 72 hours ABG    Component Value Date/Time   PHART 7.306* 01/01/2014 1603   HCO3 30.0* 01/01/2014 1603   TCO2 30 01/02/2014 1708   ACIDBASEDEF 1.0 01/01/2014 1456   O2SAT 99.0 01/01/2014 1603   CBG (last 3)   Recent Labs  01/03/14 0017 01/03/14 0412 01/03/14 0746  GLUCAP 90 92 95    Assessment/Plan: S/P Procedure(s) (LRB): Coronary artery bypass  graft times two using left internal mammary artery and left leg greater saphenous vein harvested endoscopically. (N/A) INTRAOPERATIVE TRANSESOPHAGEAL ECHOCARDIOGRAM (N/A)  1. CV- NSR- HR 80s this morning, on Cozaar for HTN, will hold Beta Blocker today can hopefully start prior to d/c 2. Pulm- no acute issues, some atelectasis encouraged use of IS 3. ID- low grade temp today, likely SIRS/Atelectasis no acute signs of infection 4. Renal- creatinine WNL, weight is stable, no LE edema 5. Dispo- patient progressing, will d/c EPW, watch HR may be able to restart Coreg prior to discharge, home in AM  LOS: 4 days    [name], [name] 01/05/2014

## 2014-01-05 NOTE — Progress Notes (Signed)
EPW d/c at this time per MD order; pt bedrest until 0915; no oozing noted from either site; pt denies pain; VSS; will cont. To monitor.

## 2014-01-06 MED ORDER — OXYCODONE HCL 5 MG PO TABS: 5.0000 mg | ORAL_TABLET | ORAL | Status: DC | PRN

## 2014-01-06 MED ORDER — LOSARTAN POTASSIUM 50 MG PO TABS: 50.0000 mg | ORAL_TABLET | Freq: Every day | ORAL | Status: DC

## 2014-01-06 MED ORDER — CARVEDILOL 3.125 MG PO TABS: 3.1250 mg | ORAL_TABLET | Freq: Two times a day (BID) | ORAL | Status: DC

## 2014-01-06 NOTE — Progress Notes (Signed)
Pt extremely anxious about going home today. Discharge instructions reviewed, prescriptions given. Nurse did not remove CTS, made Ellwood Handler, PA aware. Instructed pt that dr. Nils Pyle office will call to schedule an appointment with nurse to remove sutures. Pt verbalized understanding.

## 2014-01-06 NOTE — Care Management Note (Signed)
    Page 1 of 1   01/06/2014     2:28:44 PM   CARE MANAGEMENT NOTE 01/06/2014  Patient:  [name], [name]   Account Number:  [id number]  Date Initiated:  01/05/2014  Documentation initiated by:  Rufus Cypert  Subjective/Objective Assessment:   PT S/P CABG X 2 ON 2/27.  PTA, PT INDEPENDENT, LIVES WITH SPOUSE.     Action/Plan:   WIFE TO PROVIDE CARE AT DC.  NO DC NEEDS IDENTIFIED.   Anticipated DC Date:  01/06/2014   Anticipated DC Plan:  [facility]  CM consult      Choice offered to / List presented to:             Status of service:  Completed, signed off Medicare Important Message given?   (If response is "NO", the following Medicare IM given date fields will be blank) Date Medicare IM given:   Date Additional Medicare IM given:    Discharge Disposition:  HOME/SELF CARE  Per UR Regulation:  Reviewed for med. necessity/level of care/duration of stay  If discussed at [place] of Stay Meetings, dates discussed:    Comments:

## 2014-01-06 NOTE — Progress Notes (Signed)
      [address]       [place] [address]             [phone]      5 Days Post-Op Procedure(s) (LRB): Coronary artery bypass graft times two using left internal mammary artery and left leg greater saphenous vein harvested endoscopically. (N/A) INTRAOPERATIVE TRANSESOPHAGEAL ECHOCARDIOGRAM (N/A)  Subjective:  [name] has no complaints.  He is ready to be discharged today.  He is ambulating independently. + BM  Objective: Vital signs in last 24 hours: Temp:  [98.3 F (36.8 C)-100.1 F (37.8 C)] 98.3 F (36.8 C) (03/04 0504) Pulse Rate:  [69-89] 88 (03/04 0504) Cardiac Rhythm:  [-] Normal sinus rhythm (03/03 0823) Resp:  [18] 18 (03/04 0504) BP: (107-130)/(65-86) 128/78 mmHg (03/04 0504) SpO2:  [96 %-98 %] 98 % (03/04 0504) Weight:  [139 lb 14.4 oz (63.458 kg)] 139 lb 14.4 oz (63.458 kg) (03/04 0504)  Intake/Output from previous day: 03/03 0701 - 03/04 0700 In: 720 [P.O.:720] Out: 150 [Urine:150]  General appearance: alert, cooperative and no distress Heart: regular rate and rhythm Lungs: clear to auscultation bilaterally Abdomen: soft, non-tender; bowel sounds normal; no masses,  no organomegaly Extremities: edema trace Wound: clean and dry  Lab Results:  Recent Labs  01/04/14 0245 01/05/14 0346  WBC 15.8* 11.6*  HGB 9.9* 9.7*  HCT 28.6* 28.8*  PLT 129* 188   BMET:  Recent Labs  01/04/14 0245 01/05/14 0346  NA 134* 139  K 3.8 3.7  CL 96 100  CO2 26 28  GLUCOSE 107* 100*  BUN 13 10  CREATININE 0.47* 0.60  CALCIUM 8.5 8.7    PT/INR: No results found for this basename: LABPROT, INR,  in the last 72 hours ABG    Component Value Date/Time   PHART 7.306* 01/01/2014 1603   HCO3 30.0* 01/01/2014 1603   TCO2 30 01/02/2014 1708   ACIDBASEDEF 1.0 01/01/2014 1456   O2SAT 99.0 01/01/2014 1603   CBG (last 3)   Recent Labs  01/03/14 0746  GLUCAP 95    Assessment/Plan: S/P Procedure(s) (LRB): Coronary artery bypass graft times  two using left internal mammary artery and left leg greater saphenous vein harvested endoscopically. (N/A) INTRAOPERATIVE TRANSESOPHAGEAL ECHOCARDIOGRAM (N/A)  1. CV- NSR, HR improved running in the 80s in the last 24 hrs- will continue Cozaar and start Coreg at 3.125 mg BID 2. Pulm- no acute issues, continue IS 3. ID- Fever resolved, no Leukocytosis 4. Renal- weight is below admission, creatinine has been stable, not requiring diuretics 5. Dispo- patient is medically stable will d/c home today   LOS: 5 days    [name], ERIN 01/06/2014

## 2014-01-12 ENCOUNTER — Telehealth: Payer: Self-pay | Admitting: Cardiovascular Disease

## 2014-01-12 ENCOUNTER — Other Ambulatory Visit (INDEPENDENT_AMBULATORY_CARE_PROVIDER_SITE_OTHER): Payer: BC Managed Care – PPO

## 2014-01-12 DIAGNOSIS — Z79899 Other long term (current) drug therapy: Secondary | ICD-10-CM

## 2014-01-12 DIAGNOSIS — Z0000 Encounter for general adult medical examination without abnormal findings: Secondary | ICD-10-CM

## 2014-01-12 LAB — BASIC METABOLIC PANEL
BUN: 12 mg/dL (ref 6–23)
CO2: 28 mEq/L (ref 19–32)
Calcium: 9 mg/dL (ref 8.4–10.5)
Chloride: 107 mEq/L (ref 96–112)
Creatinine, Ser: 0.6 mg/dL (ref 0.4–1.5)
GFR: 150.02 mL/min (ref >60.00)
Glucose, Bld: 97 mg/dL (ref 70–99)
Potassium: 4 mEq/L (ref 3.5–5.1)
Sodium: 139 mEq/L (ref 135–145)

## 2014-01-12 NOTE — Telephone Encounter (Signed)
New message    Cardiac mri cancel due to insurance.

## 2014-01-12 NOTE — Telephone Encounter (Signed)
NOTED  TEST  HAS BEEN CANCELLED./CY

## 2014-01-13 ENCOUNTER — Ambulatory Visit (HOSPITAL_COMMUNITY): Admission: RE | Admit: 2014-01-13 | Payer: BC Managed Care – PPO | Source: Ambulatory Visit

## 2014-01-14 ENCOUNTER — Ambulatory Visit (INDEPENDENT_AMBULATORY_CARE_PROVIDER_SITE_OTHER): Payer: BC Managed Care – PPO

## 2014-01-14 VITALS — Ht 72.0 in | Wt 139.0 lb

## 2014-01-14 DIAGNOSIS — Z4802 Encounter for removal of sutures: Secondary | ICD-10-CM

## 2014-01-14 DIAGNOSIS — Z951 Presence of aortocoronary bypass graft: Secondary | ICD-10-CM

## 2014-01-14 DIAGNOSIS — I2510 Atherosclerotic heart disease of native coronary artery without angina pectoris: Secondary | ICD-10-CM

## 2014-01-14 NOTE — Progress Notes (Signed)
Removed 3 sutures from chest tube incision sites with no signs of infection and patient tolerated well.  

## 2014-01-19 ENCOUNTER — Other Ambulatory Visit: Payer: Self-pay | Admitting: *Deleted

## 2014-01-19 DIAGNOSIS — I2510 Atherosclerotic heart disease of native coronary artery without angina pectoris: Secondary | ICD-10-CM

## 2014-01-27 ENCOUNTER — Encounter: Payer: Self-pay | Admitting: Surgery

## 2014-01-27 ENCOUNTER — Ambulatory Visit
Admission: RE | Admit: 2014-01-27 | Discharge: 2014-01-27 | Disposition: A | Discharge status: Home / Self Care | Payer: BC Managed Care – PPO | Source: Ambulatory Visit | Attending: Surgery | Admitting: Surgery

## 2014-01-27 ENCOUNTER — Ambulatory Visit (INDEPENDENT_AMBULATORY_CARE_PROVIDER_SITE_OTHER): Payer: BC Managed Care – PPO | Admitting: Surgery

## 2014-01-27 VITALS — BP 150/98 | HR 96 | Resp 20 | Ht 72.0 in | Wt 139.0 lb

## 2014-01-27 DIAGNOSIS — I2510 Atherosclerotic heart disease of native coronary artery without angina pectoris: Secondary | ICD-10-CM

## 2014-01-27 DIAGNOSIS — Z951 Presence of aortocoronary bypass graft: Secondary | ICD-10-CM

## 2014-01-27 NOTE — Progress Notes (Signed)
      HPI: Patient returns for routine postoperative follow-up having undergone CABG x 2 on 01/01/2014. The patient's early postoperative recovery while in the hospital was notable for an uncomplicated postop course. Since hospital discharge the patient reports that he has been feeling well and walking without chest pain or shortness of breath. He is anxious to increase his activity level. He has not smoked in the past 6 weeks.   Current Outpatient Prescriptions  Medication Sig Dispense Refill  . aspirin 325 MG tablet Take 1 tablet (325 mg total) by mouth daily.  10 tablet  0  . atorvastatin (LIPITOR) 20 MG tablet Take 1 tablet (20 mg total) by mouth daily.  90 tablet  3  . carvedilol (COREG) 3.125 MG tablet Take 1 tablet (3.125 mg total) by mouth 2 (two) times daily with a meal.  60 tablet  3  . losartan (COZAAR) 50 MG tablet Take 1 tablet (50 mg total) by mouth daily.  30 tablet  3   No current facility-administered medications for this visit.    Physical Exam: BP 150/98  Pulse 96  Resp 20  Ht 6' (1.829 m)  Wt 139 lb (63.05 kg)  BMI 18.85 kg/m2  SpO2 98% He looks well Lungs are clear Cardiac exam shows a regular rate and rhythm with normal heart sounds. The chest incision is healing well and the sternum is stable. The leg incisions are healing well and there is no significant edema.   Diagnostic Tests:  CLINICAL DATA: Coronary atherosclerosis. Postoperative for CABG on  01/01/2014.  EXAM:  CHEST 2 VIEW  COMPARISON: DG CHEST 2 VIEW dated 01/05/2014; DG CHEST 2 VIEW dated  12/15/2013; DG CHEST 2 VIEW dated 12/30/2013; DG CHEST 1V PORT dated  01/01/2014  FINDINGS:  Prior CABG. Cardiothoracic indexed 52%, compatible with mild  cardiomegaly. No edema. Subtle blunting of the left lateral  costophrenic angle probably from minimal scarring or subsegmental  atelectasis, less likely trace pleural effusion.  Minimal thoracic spondylosis.  IMPRESSION:  1. Mild cardiomegaly, without  edema.  2. Minimal lateral blunting of the left costophrenic angle,  potentially from subsegmental atelectasis or trace pleural effusion.  3. Mild thoracic spondylosis.  Electronically Signed  By: Sherryl Barters M.D.  On: 01/27/2014 12:54    Impression:  Overall I think he is doing well. I encouraged him to continue walking. He is planning to participate in cardiac rehab. I told him he could drive his car but should not lift anything heavier than 10 lbs for three months postop.   Plan:  He will continue to follow up with Dr. Johnsie Cancel and will contact me if he has any problems with his incisions.

## 2014-01-29 ENCOUNTER — Ambulatory Visit (INDEPENDENT_AMBULATORY_CARE_PROVIDER_SITE_OTHER): Payer: BC Managed Care – PPO | Admitting: Cardiovascular Disease

## 2014-01-29 ENCOUNTER — Encounter: Payer: Self-pay | Admitting: Cardiovascular Disease

## 2014-01-29 VITALS — BP 164/108 | HR 89 | Ht 68.0 in | Wt 148.0 lb

## 2014-01-29 DIAGNOSIS — R931 Abnormal findings on diagnostic imaging of heart and coronary circulation: Secondary | ICD-10-CM | POA: Insufficient documentation

## 2014-01-29 DIAGNOSIS — R9439 Abnormal result of other cardiovascular function study: Secondary | ICD-10-CM

## 2014-01-29 DIAGNOSIS — I10 Essential (primary) hypertension: Secondary | ICD-10-CM

## 2014-01-29 DIAGNOSIS — E782 Mixed hyperlipidemia: Secondary | ICD-10-CM

## 2014-01-29 DIAGNOSIS — I2510 Atherosclerotic heart disease of native coronary artery without angina pectoris: Secondary | ICD-10-CM

## 2014-01-29 MED ORDER — LOSARTAN POTASSIUM 100 MG PO TABS: 100.0000 mg | ORAL_TABLET | Freq: Every day | ORAL | Status: DC

## 2014-01-29 MED ORDER — ATORVASTATIN CALCIUM 20 MG PO TABS: 20.0000 mg | ORAL_TABLET | Freq: Every day | ORAL | Status: DC

## 2014-01-29 MED ORDER — CARVEDILOL 6.25 MG PO TABS: 6.2500 mg | ORAL_TABLET | Freq: Two times a day (BID) | ORAL | Status: DC

## 2014-01-29 NOTE — Assessment & Plan Note (Signed)
Increase cozaar to 100 mg and coreg to 6.5 mg bid  F/U end of May

## 2014-01-29 NOTE — Assessment & Plan Note (Signed)
Post CABG  Sternum well healed Continue ASA and beta blocker

## 2014-01-29 NOTE — Progress Notes (Signed)
Patient ID: [name], male   DOB: 08-Feb-1956, 58 y.o.   MRN: [id number]  58 yo male with history of CAD s/p bare metal stent to RCA in 2002 and 2008 and bare metal stent LAD in 2002 and 2008, ICM (EF ~ 40%), HTN, HL, OSA, GERD, tobacco abuse with recent dyspnea on exertion concerning for angina. . Recent stress myoview with anterior and inferior wall scar with reduced LVEF, no clear ischemia. Cardiac cath done 12/22/13 With progressive disease and CABG recommended  CABG 2/27 with Dr Cyndia Bent   This included LIMA to LAD and SVG to PDA  EF 30-35% by echo  Needs f/u MRI post revascularization to risk stratify for AICD  Has not smoked since surgery   BP a bit high  Taking meds  No chest pain   ROS: Denies fever, malais, weight loss, blurry vision, decreased visual acuity, cough, sputum, SOB, hemoptysis, pleuritic pain, palpitaitons, heartburn, abdominal pain, melena, lower extremity edema, claudication, or rash.  All other systems reviewed and negative  General: Affect appropriate Healthy:  appears stated age 58: normal Neck supple with no adenopathy JVP normal no bruits no thyromegaly Lungs clear with no wheezing and good diaphragmatic motion Heart:  S1/S2 no murmur, no rub, gallop or click  Sternum wll healed  PMI normal Abdomen: benighn, BS positve, no tenderness, no AAA no bruit.  No HSM or HJR Distal pulses intact with no bruits No edema Neuro non-focal Skin warm and dry No muscular weakness   Current Outpatient Prescriptions  Medication Sig Dispense Refill  . aspirin 325 MG tablet Take 1 tablet (325 mg total) by mouth daily.  10 tablet  0  . atorvastatin (LIPITOR) 20 MG tablet Take 1 tablet (20 mg total) by mouth daily.  90 tablet  3  . carvedilol (COREG) 3.125 MG tablet Take 1 tablet (3.125 mg total) by mouth 2 (two) times daily with a meal.  60 tablet  3  . losartan (COZAAR) 50 MG tablet Take 1 tablet (50 mg total) by mouth daily.  30 tablet  3   No current  facility-administered medications for this visit.    Allergies  Review of patient's allergies indicates no known allergies.  Electrocardiogram:  SR anteroseptal infarct  No acute changes   Assessment and Plan

## 2014-01-29 NOTE — Patient Instructions (Addendum)
Your physician recommends that you schedule a follow-up appointment in: [place] Your physician has recommended you make the following change in your medication:  INCREASE  CARVEDILOL  TO 6.25 MG TWICE DAILY AND   LOSARTAN  100 MG  EVERY DAY

## 2014-01-29 NOTE — Assessment & Plan Note (Signed)
Cholesterol is at goal.  Continue current dose of statin and diet Rx.  No myalgias or side effects.  F/U  LFT's in 6 months. No results found for this basename: [place]  Check labs next visit continue statin

## 2014-01-29 NOTE — Assessment & Plan Note (Signed)
Will need cardiac MRI in June 3 months post revascularization to risk stratify for AICD  ARB and beta blocker increased Euvolemic

## 2014-03-15 ENCOUNTER — Telehealth: Payer: Self-pay | Admitting: Cardiovascular Disease

## 2014-03-15 NOTE — Telephone Encounter (Signed)
LMTCB ./CY 

## 2014-03-15 NOTE — Telephone Encounter (Signed)
Return to work 6/15 still needs MRI first week in June for EF and see if AICD needed

## 2014-03-15 NOTE — Telephone Encounter (Signed)
PT NEEDS  RETURN TO WORK  DATE  WILL FORWARD TO DR Johnsie Cancel . NEXT  F/U IS   03-31-14.

## 2014-03-15 NOTE — Telephone Encounter (Signed)
New message    Talk to [name]---Need a specific date for return to work note.  Job is going to terminate [name] if he does not get a specific date.

## 2014-03-16 NOTE — Telephone Encounter (Signed)
LMTCB ./CY 

## 2014-03-17 NOTE — Telephone Encounter (Signed)
I spoke with the pt and he will be terminated from his job on 03/18/14 if he does not provide his employer with a note about return to work date. I will place letter at the front desk for pick-up. Please see Dr Kyla Balzarine comments from 03/15/14 in this phone note.

## 2014-03-31 ENCOUNTER — Ambulatory Visit (INDEPENDENT_AMBULATORY_CARE_PROVIDER_SITE_OTHER): Payer: BC Managed Care – PPO | Admitting: Cardiovascular Disease

## 2014-03-31 ENCOUNTER — Encounter (INDEPENDENT_AMBULATORY_CARE_PROVIDER_SITE_OTHER): Payer: Self-pay

## 2014-03-31 VITALS — BP 150/80 | HR 72 | Ht 68.0 in | Wt 145.4 lb

## 2014-03-31 DIAGNOSIS — Z79899 Other long term (current) drug therapy: Secondary | ICD-10-CM

## 2014-03-31 DIAGNOSIS — R9439 Abnormal result of other cardiovascular function study: Secondary | ICD-10-CM

## 2014-03-31 DIAGNOSIS — E782 Mixed hyperlipidemia: Secondary | ICD-10-CM

## 2014-03-31 DIAGNOSIS — R931 Abnormal findings on diagnostic imaging of heart and coronary circulation: Secondary | ICD-10-CM

## 2014-03-31 MED ORDER — HYDROCHLOROTHIAZIDE 25 MG PO TABS: 12.5000 mg | ORAL_TABLET | Freq: Every day | ORAL | Status: DC

## 2014-03-31 NOTE — Assessment & Plan Note (Signed)
Ischemic DCM EF 30-35% pre CABG 2/27  F/U cardiac MRI first part of July to quantify EF and assess need for AICD

## 2014-03-31 NOTE — Patient Instructions (Addendum)
Your physician recommends that you schedule a follow-up appointment in:  NEXT  AVAILABLE WITH DR  [facility] Your physician has recommended you make the following change in your medication:  ADD  HCTZ  12.5 MG  EVERY DAY  Your physician has requested that you have a cardiac MRI. Cardiac MRI uses a computer to create images of your heart as its beating, producing both still and moving pictures of your heart and major blood vessels. For further information please visit [email or web address] Please follow the instruction sheet given to you today for more information. FIRST  OR  SECOND  WEEK OF   July  Your physician recommends that you return for lab work in:   [place]  BMET

## 2014-03-31 NOTE — Assessment & Plan Note (Signed)
Stable with no angina and good activity level.  Continue medical Rx  

## 2014-03-31 NOTE — Assessment & Plan Note (Signed)
Cholesterol is at goal.  Continue current dose of statin and diet Rx.  No myalgias or side effects.  F/U  LFT's in 6 months. No results found for this basename: LDLCALC  F/U labs in 4 weeks

## 2014-03-31 NOTE — Assessment & Plan Note (Signed)
Add diuretic  BMET in 4 weeks f/u me next available

## 2014-03-31 NOTE — Progress Notes (Signed)
Patient ID: GWIN EAGON, male   DOB: Aug 31, 1956, 58 y.o.   MRN: [id number] 58 yo male with history of CAD s/p bare metal stent to RCA in 2002 and 2008 and bare metal stent LAD in 2002 and 2008, ICM (EF ~ 40%), HTN, HL, OSA, GERD, tobacco abuse with recent dyspnea on exertion concerning for angina. . Recent stress myoview with anterior and inferior wall scar with reduced LVEF, no clear ischemia. Cardiac cath done 12/22/13  With progressive disease and CABG recommended  CABG 2/27 with Dr Cyndia Bent This included LIMA to LAD and SVG to PDA EF 30-35% by echo Needs f/u MRI post revascularization to risk stratify for AICD  Has not smoked since surgery   BP a bit high Taking meds No chest pain  Cozaar and coreg increased last visit  BP still high       ROS: Denies fever, malais, weight loss, blurry vision, decreased visual acuity, cough, sputum, SOB, hemoptysis, pleuritic pain, palpitaitons, heartburn, abdominal pain, melena, lower extremity edema, claudication, or rash.  All other systems reviewed and negative  General: Affect appropriate Healthy:  appears stated age 59: normal Neck supple with no adenopathy JVP normal no bruits no thyromegaly Lungs clear with no wheezing and good diaphragmatic motion Heart:  S1/S2 no murmur, no rub, gallop or click PMI normal Abdomen: benighn, BS positve, no tenderness, no AAA no bruit.  No HSM or HJR Distal pulses intact with no bruits No edema Neuro non-focal Skin warm and dry No muscular weakness   Current Outpatient Prescriptions  Medication Sig Dispense Refill  . aspirin 325 MG tablet Take 1 tablet (325 mg total) by mouth daily.  10 tablet  0  . atorvastatin (LIPITOR) 20 MG tablet Take 1 tablet (20 mg total) by mouth daily.  90 tablet  3  . carvedilol (COREG) 6.25 MG tablet Take 1 tablet (6.25 mg total) by mouth 2 (two) times daily with a meal.  180 tablet  3  . losartan (COZAAR) 100 MG tablet Take 1 tablet (100 mg total) by mouth daily.  90  tablet  3   No current facility-administered medications for this visit.    Allergies  Review of patient's allergies indicates no known allergies.  Electrocardiogram:   2/28  SR nonspecific ST changes possible old IMI  Assessment and Plan

## 2014-04-01 ENCOUNTER — Other Ambulatory Visit: Payer: Self-pay | Admitting: *Deleted

## 2014-04-01 ENCOUNTER — Telehealth: Payer: Self-pay | Admitting: Cardiovascular Disease

## 2014-04-01 MED ORDER — HYDROCHLOROTHIAZIDE 25 MG PO TABS: 12.5000 mg | ORAL_TABLET | Freq: Every day | ORAL | Status: DC

## 2014-04-01 NOTE — Telephone Encounter (Signed)
New message     Pt was seen yesterday.  Went to pharmacy to get presc but [facility].  Mindy in refills [facility].  Pt want to know if he is to start medication now---[facility]---or did the doctor want him to wait about 7 days---and get presc from mail order pharmacy.   Please call pt

## 2014-04-01 NOTE — Telephone Encounter (Signed)
Called requesting the [facility].  Sent Rx to them for 30 days.

## 2014-04-02 ENCOUNTER — Encounter: Payer: Self-pay | Admitting: Cardiovascular Disease

## 2014-05-04 DIAGNOSIS — Z0279 Encounter for issue of other medical certificate: Secondary | ICD-10-CM

## 2014-05-05 ENCOUNTER — Other Ambulatory Visit (HOSPITAL_COMMUNITY): Payer: BC Managed Care – PPO

## 2014-05-11 ENCOUNTER — Telehealth: Payer: Self-pay | Admitting: Cardiovascular Disease

## 2014-05-11 NOTE — Telephone Encounter (Signed)
DOD Dr. Marlou Porch reviewed & advised pt should stop  HCTZ & keep blood pressure log call & call back with readings I spoke with pt & he will stop the HCTZ & call back later this week with his blood pressure readings.  Horton Chin RN

## 2014-05-11 NOTE — Telephone Encounter (Signed)
Pt calls this afternoon 3 reasons  1.) states blood pressure has been running low the past 2-3 weeks  Ranging 85/64-94/70  ( states bp cuff is new & this is after medications)  2.) states he is tired all of the time & when he bends over he feels dizzy  3.)  "how important is the MRI?"  States his insurance will not kick in until later this month & will reschedule cardiac MRI.

## 2014-05-11 NOTE — Telephone Encounter (Signed)
New message          Pt has a question about his cardiac MRI and how low is too low for a bp reading / pt is requesting that lauren give him a call after 2pm

## 2014-05-12 ENCOUNTER — Ambulatory Visit (HOSPITAL_COMMUNITY): Payer: BC Managed Care – PPO

## 2014-05-12 ENCOUNTER — Other Ambulatory Visit: Payer: BC Managed Care – PPO

## 2014-05-13 ENCOUNTER — Other Ambulatory Visit: Payer: Self-pay | Admitting: *Deleted

## 2014-05-13 ENCOUNTER — Telehealth: Payer: Self-pay | Admitting: *Deleted

## 2014-05-13 DIAGNOSIS — R9430 Abnormal result of cardiovascular function study, unspecified: Secondary | ICD-10-CM

## 2014-05-13 NOTE — Telephone Encounter (Signed)
SPOKE WITH  PT   CURRENLTY  HAS  INS   THRU  WIFE   WILL  RESCHEDULE MRI .Adonis Housekeeper

## 2014-05-13 NOTE — Telephone Encounter (Signed)
Message copied by Richmond Campbell on Thu May 13, 2014 12:45 PM ------      Message from: Josue Hector      Created: Sun May 09, 2014  9:55 PM      Regarding: FW: Cardiac MRI 05-12-14       If MRI not done needs at least an echo for EF to consider AICD by end of month Make sure he has EP f/ut       ----- Message -----         From: Lillia Pauls         Sent: 05/05/2014   6:28 PM           To: Josue Hector, MD      Subject: Cardiac MRI 05-12-14                                       Dr. Johnsie Cancel            Pt scheduled for 05-12-14.  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  Pt's wife completed paperwork yesterday to put pt on her policy from her employer.              If policy is not in effect in time for 05-12-14 test ok to reschedule?  How long can pt wait?            Thanks            Charmaine       ------

## 2014-05-17 ENCOUNTER — Other Ambulatory Visit: Payer: Self-pay | Admitting: *Deleted

## 2014-05-17 DIAGNOSIS — Z951 Presence of aortocoronary bypass graft: Secondary | ICD-10-CM

## 2014-05-25 DIAGNOSIS — Z0271 Encounter for disability determination: Secondary | ICD-10-CM

## 2014-05-27 ENCOUNTER — Ambulatory Visit (HOSPITAL_COMMUNITY): Payer: BC Managed Care – PPO | Attending: Cardiology

## 2014-05-27 DIAGNOSIS — E785 Hyperlipidemia, unspecified: Secondary | ICD-10-CM | POA: Insufficient documentation

## 2014-05-27 DIAGNOSIS — R0609 Other forms of dyspnea: Secondary | ICD-10-CM | POA: Insufficient documentation

## 2014-05-27 DIAGNOSIS — I10 Essential (primary) hypertension: Secondary | ICD-10-CM | POA: Insufficient documentation

## 2014-05-27 DIAGNOSIS — I079 Rheumatic tricuspid valve disease, unspecified: Secondary | ICD-10-CM | POA: Insufficient documentation

## 2014-05-27 DIAGNOSIS — I059 Rheumatic mitral valve disease, unspecified: Secondary | ICD-10-CM | POA: Insufficient documentation

## 2014-05-27 DIAGNOSIS — F17200 Nicotine dependence, unspecified, uncomplicated: Secondary | ICD-10-CM | POA: Insufficient documentation

## 2014-05-27 DIAGNOSIS — R0989 Other specified symptoms and signs involving the circulatory and respiratory systems: Secondary | ICD-10-CM | POA: Insufficient documentation

## 2014-05-27 DIAGNOSIS — I2510 Atherosclerotic heart disease of native coronary artery without angina pectoris: Secondary | ICD-10-CM | POA: Insufficient documentation

## 2014-05-27 DIAGNOSIS — Z951 Presence of aortocoronary bypass graft: Secondary | ICD-10-CM

## 2014-05-27 DIAGNOSIS — I379 Nonrheumatic pulmonary valve disorder, unspecified: Secondary | ICD-10-CM | POA: Insufficient documentation

## 2014-05-27 NOTE — Progress Notes (Signed)
2D Echo completed. 05/27/2014  

## 2014-06-02 ENCOUNTER — Other Ambulatory Visit: Payer: Self-pay | Admitting: *Deleted

## 2014-06-02 MED ORDER — LOSARTAN POTASSIUM 100 MG PO TABS: 100.0000 mg | ORAL_TABLET | Freq: Every day | ORAL | Status: DC

## 2014-06-02 MED ORDER — ATORVASTATIN CALCIUM 20 MG PO TABS: 20.0000 mg | ORAL_TABLET | Freq: Every day | ORAL | Status: DC

## 2014-06-02 MED ORDER — HYDROCHLOROTHIAZIDE 25 MG PO TABS: 12.5000 mg | ORAL_TABLET | Freq: Every day | ORAL | Status: DC

## 2014-06-02 MED ORDER — CARVEDILOL 6.25 MG PO TABS: 6.2500 mg | ORAL_TABLET | Freq: Two times a day (BID) | ORAL | Status: DC

## 2014-06-08 ENCOUNTER — Ambulatory Visit (INDEPENDENT_AMBULATORY_CARE_PROVIDER_SITE_OTHER): Payer: BC Managed Care – PPO | Admitting: Cardiovascular Disease

## 2014-06-08 ENCOUNTER — Encounter: Payer: Self-pay | Admitting: Cardiovascular Disease

## 2014-06-08 VITALS — BP 146/94 | HR 82 | Ht 68.0 in | Wt 143.8 lb

## 2014-06-08 DIAGNOSIS — E782 Mixed hyperlipidemia: Secondary | ICD-10-CM

## 2014-06-08 DIAGNOSIS — R0989 Other specified symptoms and signs involving the circulatory and respiratory systems: Secondary | ICD-10-CM

## 2014-06-08 DIAGNOSIS — R9439 Abnormal result of other cardiovascular function study: Secondary | ICD-10-CM

## 2014-06-08 DIAGNOSIS — I2510 Atherosclerotic heart disease of native coronary artery without angina pectoris: Secondary | ICD-10-CM

## 2014-06-08 DIAGNOSIS — R931 Abnormal findings on diagnostic imaging of heart and coronary circulation: Secondary | ICD-10-CM

## 2014-06-08 DIAGNOSIS — R9430 Abnormal result of cardiovascular function study, unspecified: Secondary | ICD-10-CM

## 2014-06-08 NOTE — Assessment & Plan Note (Signed)
Cholesterol is at goal.  Continue current dose of statin and diet Rx.  No myalgias or side effects.  F/U  LFT's in 6 months. No results found for this basename: LDLCALC             

## 2014-06-08 NOTE — Patient Instructions (Signed)
Your physician recommends that you schedule a follow-up appointment in: [place] Your physician recommends that you continue on your current medications as directed. Please refer to the Current Medication list given to you today.  Your physician has requested that you have a MUGA: A multigated acquisition (MUGA) scan is a test that looks at the chambers and blood vessels of the heart. Please see the CareNotes handout/brochure given to you today for further information.

## 2014-06-08 NOTE — Assessment & Plan Note (Signed)
Need to quantify more since he is borderline for AICD criteria  MUGA if possible

## 2014-06-08 NOTE — Progress Notes (Signed)
Patient ID: [name], male   DOB: 1955-12-02, 58 y.o.   MRN: [id number] 58 yo male with history of CAD s/p bare metal stent to RCA in 2002 and 2008 and bare metal stent LAD in 2002 and 2008, ICM (EF ~ 40%), HTN, HL, OSA, GERD, tobacco abuse with recent dyspnea on exertion concerning for angina. . Recent stress myoview with anterior and inferior wall scar with reduced LVEF, no clear ischemia. Cardiac cath done 12/22/13  With progressive disease and CABG recommended  CABG 2/27 with Dr Cyndia Bent This included LIMA to LAD and SVG to PDA EF 30-35% by echo Needs f/u MRI post revascularization to risk stratify for AICD  Has not smoked since surgery    Cozaar and coreg increased last visit  Home readings reviewed and much better   Echo 7/23  EF 35-40%    I am still concerned about quantifying his EF more accurately  Apparently couldn't get MRI approved  Will try ot order MUGA before sees EP  ROS: Denies fever, malais, weight loss, blurry vision, decreased visual acuity, cough, sputum, SOB, hemoptysis, pleuritic pain, palpitaitons, heartburn, abdominal pain, melena, lower extremity edema, claudication, or rash.  All other systems reviewed and negative  General: Affect appropriate Healthy:  appears stated age 58: normal Neck supple with no adenopathy JVP normal no bruits no thyromegaly Lungs clear with no wheezing and good diaphragmatic motion Heart:  S1/S2 no murmur, no rub, gallop or click PMI normal Abdomen: benighn, BS positve, no tenderness, no AAA no bruit.  No HSM or HJR Distal pulses intact with no bruits No edema Neuro non-focal Skin warm and dry No muscular weakness   Current Outpatient Prescriptions  Medication Sig Dispense Refill  . aspirin 325 MG tablet Take 1 tablet (325 mg total) by mouth daily.  10 tablet  0  . atorvastatin (LIPITOR) 20 MG tablet Take 1 tablet (20 mg total) by mouth daily.  90 tablet  0  . carvedilol (COREG) 6.25 MG tablet Take 1 tablet (6.25 mg total) by  mouth 2 (two) times daily with a meal.  180 tablet  0  . hydrochlorothiazide (HYDRODIURIL) 25 MG tablet Take 0.5 tablets (12.5 mg total) by mouth daily.  45 tablet  0  . losartan (COZAAR) 100 MG tablet Take 1 tablet (100 mg total) by mouth daily.  90 tablet  0   No current facility-administered medications for this visit.    Allergies  Review of patient's allergies indicates no known allergies.  Electrocardiogram: SR rate 64 anterolateral MI   Assessment and Plan

## 2014-06-08 NOTE — Assessment & Plan Note (Signed)
Stable with no angina and good activity level.  Continue medical Rx Will order MUGA scan and he see's EP to discuss AICD RX

## 2014-06-08 NOTE — Assessment & Plan Note (Signed)
Well controlled.  Continue current medications and low sodium Dash type diet.    

## 2014-06-10 ENCOUNTER — Encounter: Payer: Self-pay | Admitting: Cardiovascular Disease

## 2014-06-10 ENCOUNTER — Ambulatory Visit (HOSPITAL_COMMUNITY): Payer: BC Managed Care – PPO | Attending: Cardiology | Admitting: Radiology

## 2014-06-10 DIAGNOSIS — I2589 Other forms of chronic ischemic heart disease: Secondary | ICD-10-CM

## 2014-06-10 DIAGNOSIS — I43 Cardiomyopathy in diseases classified elsewhere: Secondary | ICD-10-CM

## 2014-06-10 DIAGNOSIS — Z951 Presence of aortocoronary bypass graft: Secondary | ICD-10-CM | POA: Insufficient documentation

## 2014-06-10 DIAGNOSIS — R9430 Abnormal result of cardiovascular function study, unspecified: Secondary | ICD-10-CM

## 2014-06-10 DIAGNOSIS — Z9861 Coronary angioplasty status: Secondary | ICD-10-CM | POA: Insufficient documentation

## 2014-06-10 DIAGNOSIS — I2510 Atherosclerotic heart disease of native coronary artery without angina pectoris: Secondary | ICD-10-CM | POA: Insufficient documentation

## 2014-06-10 NOTE — Progress Notes (Signed)
Muga Study  Referring Provider:  Josue Hector, MD  Date of Procedure: 06/10/2014  Indication: Assess EF for Possible AICD CAD, CABG, Stent, 05-27-2014 Echo: EF=35-40% IV 20 G (R) AC x 1, tolerated well. Irven Baltimore, RN.  Muga Information:  The patient's red blood cells were labeled using the Ultra Tag method with 33.0 mci of Technetium 42m Pertechnetate.  The images were reconstructed in the Anterior, Lateral and Left Anterior oblique views.  Impression: EF 43%; akinesis of the septum and apex.  Kirk Ruths

## 2014-06-11 ENCOUNTER — Other Ambulatory Visit: Payer: Self-pay | Admitting: Cardiology

## 2014-06-11 DIAGNOSIS — Z79899 Other long term (current) drug therapy: Secondary | ICD-10-CM

## 2014-06-14 ENCOUNTER — Encounter (HOSPITAL_COMMUNITY): Payer: BC Managed Care – PPO

## 2014-06-14 MED ORDER — TECHNETIUM TC 99M-LABELED RED BLOOD CELLS IV KIT
33.0000 | PACK | Freq: Once | INTRAVENOUS | Status: AC | PRN
  Administered 2014-06-10: 33 via INTRAVENOUS

## 2014-06-17 ENCOUNTER — Telehealth: Payer: Self-pay | Admitting: Cardiovascular Disease

## 2014-06-17 NOTE — Telephone Encounter (Signed)
New message     Pt want to know why is he being scheduled for another MRI.  His ins would not pay for it in July so he had a [place].  And, pt want results of muga test.  Call [phone] if after 3pm or call [phone] if before 3pm

## 2014-06-17 NOTE — Telephone Encounter (Signed)
Pt aware of MUGA results.  I spoke with Dr Johnsie Cancel and he would like the pt to keep appointment with Dr Rayann Heman. Pt aware of appt with Dr Rayann Heman.

## 2014-06-24 ENCOUNTER — Ambulatory Visit (INDEPENDENT_AMBULATORY_CARE_PROVIDER_SITE_OTHER): Payer: BC Managed Care – PPO | Admitting: Internal Medicine

## 2014-06-24 ENCOUNTER — Encounter: Payer: Self-pay | Admitting: Internal Medicine

## 2014-06-24 VITALS — BP 150/98 | HR 62 | Ht 68.0 in | Wt 144.4 lb

## 2014-06-24 DIAGNOSIS — I255 Ischemic cardiomyopathy: Secondary | ICD-10-CM | POA: Insufficient documentation

## 2014-06-24 DIAGNOSIS — I2589 Other forms of chronic ischemic heart disease: Secondary | ICD-10-CM

## 2014-06-24 DIAGNOSIS — I519 Heart disease, unspecified: Secondary | ICD-10-CM

## 2014-06-24 DIAGNOSIS — Z951 Presence of aortocoronary bypass graft: Secondary | ICD-10-CM

## 2014-06-24 DIAGNOSIS — I2510 Atherosclerotic heart disease of native coronary artery without angina pectoris: Secondary | ICD-10-CM

## 2014-06-24 NOTE — Progress Notes (Signed)
Referring Physician:  Dr Lovenia Shuck is a 58 y.o. male with a h/o CAD s/p CABG, ischemic CM (EF 43%) and NYHA Class II/III CHF who presents today for EP consultation regarding risks of sudden death.  He has a longstanding h/o CAD.  He has had several prior MIs for which he underwent PCI.  He reports having a prior VF arrest in the setting of AMI (remotely) for which he was revascularized.  He has most recently undergone CABG 2/15 by Dr Cyndia Bent.  He has been treated with an optimal medical regimen and his EF continues to improve.  Echo 7/15 revealed EF 35-50%.  MUGU 8/15 reveals EF 43%. The patient remains active .  Today, he denies symptoms of palpitations, chest pain,  orthopnea, PND, lower extremity edema,  presyncope, syncope, or neurologic sequela.  He has occasional postural dizziness which is worse with HCTZ.  He says that after several days of daily hctz his BP will become low and he has to hold the medicine for a day or two.  He has chronic DOE which did not improve with cabg.  The patient is otherwise without complaint today.   Past Medical History  Diagnosis Date  . Mixed hyperlipidemia   . GERD (gastroesophageal reflux disease)   . Hypertension     under control with med., has been on med. > 12 yr.  . 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 Sleep apnea     uses CPAP nightly  . Dental bridge present     lower  . Ischemic cardiomyopathy     a. Echo 09/2012:  Mod LVH, focal basal hypertrophy, EF 30-35%, mid to dist inf-lat HK, Gr 1 diast dysfn, mild to mod LAE  . CAD (coronary artery disease)     a. s/p prior MI; hx of stent to RCA and LAD;  b. LHC 12/2006:  LM 20%, pLAD 40%, mLAD stent ok, apical LAD 90% (1.5-72mm vessel), oD1 40-50%, AV groove CFX 30%, oOM1 30%, pRCA 30% (multiple), mRCA stent ok with 30-40% ISR, dRCA 30%, dAnt, inf-apical severe HK, EF 40% => med Rx.;  12/2013 Cath: LM nl, LAD 95ost, 39m ISR, 99apex, LCX nl, RCA 40p, 100 ISR, L->R collats,  EF 35-40%-->pending CABG  . Hx of cardiovascular stress test     a.  Myoview (02/2010): EF 41%, septal, apical and inf infarct, no ischemia;   b.  Lexiscan Myoview (12/02/13): Large fixed perfusion defect in the RCA and LAD territory, no ischemia, EF 40%.  . Tobacco abuse   . Myocardial infarction     x2  . Arthritis    Past Surgical History  Procedure Laterality Date  . Knee arthroscopy      left knee x 1, right knee x 2  . Coronary angioplasty with stent placement  05/22/2001    PCI - RCA - LAD  . Coronary angioplasty with stent placement  11/11/2006    PCI - BMS - LAD  . Coronary angioplasty with stent placement  11/15/2006    PCI - BMS - RCA  . Coronary angioplasty with stent placement  03/16/2010  . Cardiac catheterization  06/27/2001; 12/13/2006; 02/20/2010  . Open reduction internal fixation (orif) scaphoid with iliac crest bone graft  10/15/2012    Procedure: OPEN REDUCTION INTERNAL FIXATION (ORIF) SCAPHOID WITH ILIAC CREST BONE GRAFT;  Surgeon: Schuyler Amor, MD;  Location: [place];  Service: Orthopedics;  Laterality: Right;  No iliac creast bone  graft taken, used graft from radius  . Hand surgery Right     screws  . Foot surgery Right     achilles tendon reattached  . Coronary artery bypass graft N/A 01/01/2014    Procedure: Coronary artery bypass graft times two using left internal mammary artery and left leg greater saphenous vein harvested endoscopically.;  Surgeon: Gaye Pollack, MD;  Location: [facility] OR;  Service: Open Heart Surgery;  Laterality: N/A;  . Intraoperative transesophageal echocardiogram N/A 01/01/2014    Procedure: INTRAOPERATIVE TRANSESOPHAGEAL ECHOCARDIOGRAM;  Surgeon: Gaye Pollack, MD;  Location: [facility] OR;  Service: Open Heart Surgery;  Laterality: N/A;    Current Outpatient Prescriptions  Medication Sig Dispense Refill  . aspirin 325 MG tablet Take 1 tablet (325 mg total) by mouth daily.  10 tablet  0  . atorvastatin (LIPITOR) 20 MG tablet  Take 1 tablet (20 mg total) by mouth daily.  90 tablet  0  . carvedilol (COREG) 6.25 MG tablet Take 1 tablet (6.25 mg total) by mouth 2 (two) times daily with a meal.  180 tablet  0  . hydrochlorothiazide (HYDRODIURIL) 25 MG tablet Take 0.5 tablets (12.5 mg total) by mouth daily.  45 tablet  0  . losartan (COZAAR) 100 MG tablet Take 1 tablet (100 mg total) by mouth daily.  90 tablet  0   No current facility-administered medications for this visit.    No Known Allergies  History   Social History  . Marital Status: Married    Spouse Name: N/A    Number of Children: N/A  . Years of Education: N/A   Occupational History  . Not on file.   Social History Main Topics  . Smoking status: Former Smoker -- 1.00 packs/day for 20 years    Types: Cigarettes  . Smokeless tobacco: Never Used  . Alcohol Use: Yes     Comment: occasional  . Drug Use: No  . Sexual Activity: Not on file   Other Topics Concern  . Not on file   Social History Narrative  . No narrative on file    Family History  Problem Relation Age of Onset  . Heart disease Father   . Heart disease Maternal Grandmother   . Heart disease Maternal Grandfather   . Heart disease Paternal Grandmother   . Heart disease Paternal Grandfather     ROS- All systems are reviewed and negative except as per the HPI above  Physical Exam: Filed Vitals:   06/24/14 0902  BP: 150/98  Pulse: 62  Height: 5\' 8"  (1.727 m)  Weight: 144 lb 6.4 oz (65.499 kg)    GEN- The patient is well appearing, alert and oriented x 3 today.   Head- normocephalic, atraumatic Eyes-  Sclera clear, conjunctiva pink Ears- hearing intact Oropharynx- clear Neck- supple  Lungs- Clear to ausculation bilaterally, normal work of breathing Heart- Regular rate and rhythm  GI- soft, NT, ND, + BS Extremities- no clubbing, cyanosis, or edema MS- no significant deformity or atrophy Skin- no rash or lesion Psych- euthymic mood, full affect Neuro- strength and  sensation are intact  EKG today reveals sinus rhythm 62 bpm, PR 206, QRS 100, Qtc 430, anterior MI with anterolateral TWI  Assessment and Plan:   1. Ischemic CM/ chronic systolic dysfunction/ CAD The patient has an ischemic CM (EF 43%), NYHA Class II/III CHF, and CAD.  His EF continues to improve with medical therapy s/p revascularization 2/15.  At this time, he does not meet criteria for  ICD implantation.  His QRS < 130 msec and he does not meet criteria for CRT.  I would therefore recommend ongoing medical management by Dr Johnsie Cancel.  If he has further decline in his EF, syncope, or any symptoms of arrhythmia then I would be happy to see him again for further evaluation at that time.  EP to see as needed going forward.

## 2014-06-24 NOTE — Patient Instructions (Signed)
Your physician recommends that you continue on your current medications as directed. Please refer to the Current Medication list given to you today. Your physician recommends that you schedule a follow-up appointment in: AS NEEDED WITH DR Rayann Heman.

## 2014-06-28 ENCOUNTER — Other Ambulatory Visit (HOSPITAL_COMMUNITY): Payer: BC Managed Care – PPO

## 2014-07-27 ENCOUNTER — Telehealth: Payer: Self-pay | Admitting: Cardiovascular Disease

## 2014-07-27 NOTE — Telephone Encounter (Signed)
FOLLOW UP:   Please give [name] a call she needs clearification about pt's paper work and work status.

## 2014-07-27 NOTE — Telephone Encounter (Signed)
LMTCB ./CY 

## 2014-07-29 NOTE — Telephone Encounter (Signed)
LMTCB ./CY 

## 2014-07-30 NOTE — Telephone Encounter (Signed)
LMTCB ./CY 

## 2014-08-02 NOTE — Telephone Encounter (Signed)
ADDITIONAL INFO NEEDED ON FORM PER  RUTHIE    FORM  REVIEWED  AND  NECESSARY  QUESTIONS  ANSWERED AND  REFAXED .[name]

## 2014-08-03 ENCOUNTER — Ambulatory Visit: Payer: Self-pay

## 2014-09-08 ENCOUNTER — Other Ambulatory Visit: Payer: Self-pay

## 2014-09-08 MED ORDER — ATORVASTATIN CALCIUM 20 MG PO TABS: 20.0000 mg | ORAL_TABLET | Freq: Every day | ORAL | Status: DC

## 2014-09-08 MED ORDER — LOSARTAN POTASSIUM 100 MG PO TABS: 100.0000 mg | ORAL_TABLET | Freq: Every day | ORAL | Status: DC

## 2014-09-14 IMAGING — CR DG CHEST 1V PORT
1 series · 1 of 1 positions shown · non-contrast
Comparison: DG CHEST 1V PORT dated 01/01/2014;

CLINICAL DATA: Postop

EXAM:
PORTABLE CHEST - 1 VIEW

[AP]
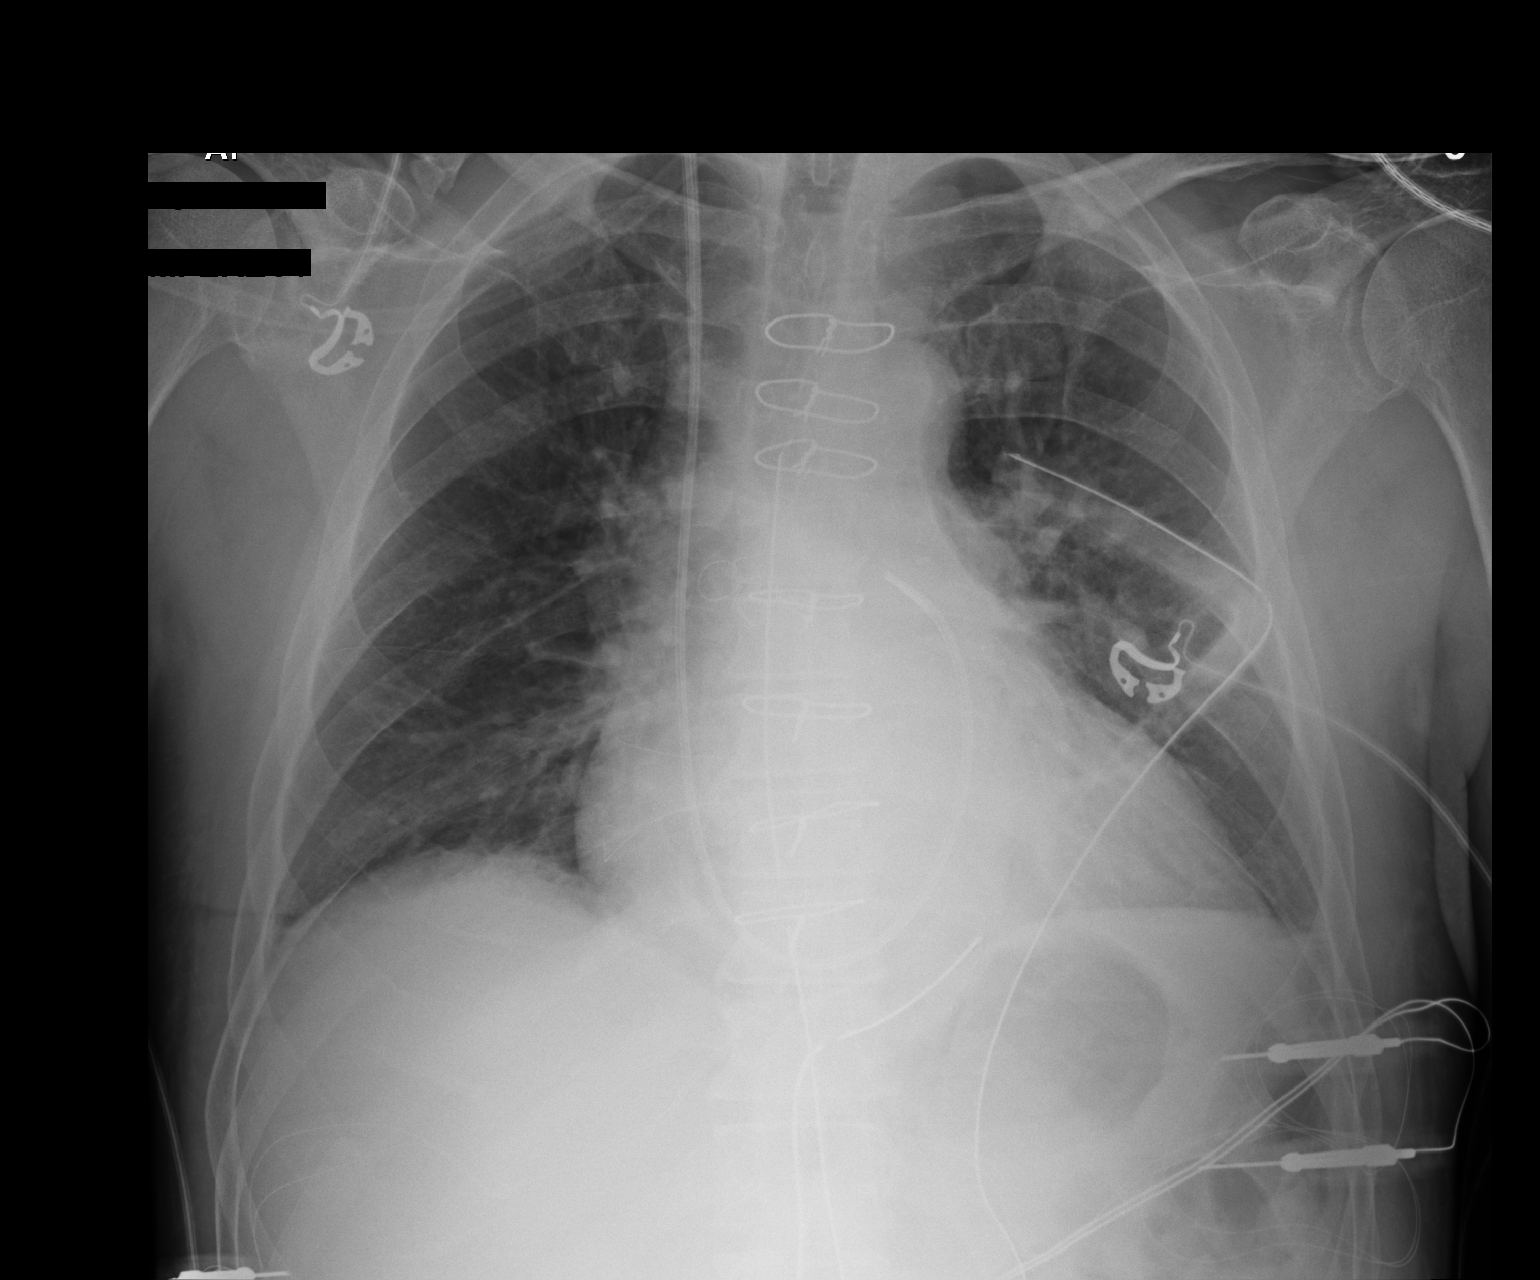

[1 of 1 positions shown; findings below may reference images not displayed]

DG CHEST 2 VIEW dated
12/01/2013; DG CHEST 2 VIEW dated 09/23/2012; DG CHEST 2 VIEW dated
06/20/2010
FINDINGS: Grossly unchanged borderline enlarged cardiac silhouette and
mediastinal contours post median sternotomy and CABG. Interval
extubation and removal of enteric tube. Otherwise, stable
positioning of remaining support apparatus. No pneumothorax. Slight
worsening of bilateral infrahilar heterogeneous opacities, likely
atelectasis. Interval development of trace bilateral effusions. No
definite evidence of edema. Unchanged bones.
IMPRESSION: 1. Interval extubation and removal of enteric tube. Otherwise,
stable positioning of remaining support apparatus. No pneumothorax.
2. Interval development of trace bilateral pleural effusions. No
evidence of edema.
3. Slight worsening of bilateral perihilar atelectasis.

## 2014-09-19 NOTE — Progress Notes (Signed)
Patient ID: [name], male   DOB: [birth year], 58 y.o.   MRN: [id number] 58 yo male with history of CAD s/p bare metal stent to RCA in 2002 and 2008 and bare metal stent LAD in 2002 and 2008, ICM (EF ~ 40%), HTN, HL, OSA, GERD, tobacco abuse with recent dyspnea on exertion concerning for angina. . Recent stress myoview with anterior and inferior wall scar with reduced LVEF, no clear ischemia. Cardiac cath done 12/22/13  With progressive disease and CABG recommended  CABG 01/01/14  with Dr Cyndia Bent This included LIMA to LAD and SVG to PDA EF 30-35% by echo   Has not smoked since surgery   Cozaar and coreg increased last visit Home readings reviewed and much better   Echo 05/27/14 EF 35-40%  MUGA  06/10/14  EF 43%  Seein by Doctor Allred and felt to be not a candidate for CRT or AICD  Going to [employer] for Thanksgiving     ROS: Denies fever, malais, weight loss, blurry vision, decreased visual acuity, cough, sputum, SOB, hemoptysis, pleuritic pain, palpitaitons, heartburn, abdominal pain, melena, lower extremity edema, claudication, or rash.  All other systems reviewed and negative  General: Affect appropriate Healthy:  appears stated age 48: normal Neck supple with no adenopathy JVP normal no bruits no thyromegaly Lungs clear with no wheezing and good diaphragmatic motion Heart:  S1/S2 no murmur, no rub, gallop or click PMI normal Abdomen: benighn, BS positve, no tenderness, no AAA no bruit.  No HSM or HJR Distal pulses intact with no bruits No edema Neuro non-focal Skin warm and dry No muscular weakness   Current Outpatient Prescriptions  Medication Sig Dispense Refill  . aspirin 325 MG tablet Take 1 tablet (325 mg total) by mouth daily. 10 tablet 0  . atorvastatin (LIPITOR) 20 MG tablet Take 1 tablet (20 mg total) by mouth daily. 90 tablet 3  . carvedilol (COREG) 6.25 MG tablet Take 1 tablet (6.25 mg total) by mouth 2 (two) times daily with a meal. 180 tablet 0  .  hydrochlorothiazide (HYDRODIURIL) 25 MG tablet Take 0.5 tablets (12.5 mg total) by mouth daily. 45 tablet 0  . losartan (COZAAR) 100 MG tablet Take 1 tablet (100 mg total) by mouth daily. 90 tablet 3   No current facility-administered medications for this visit.    Allergies  Review of patient's allergies indicates no known allergies.  Electrocardiogram:  SR rate 62 anterolateral MI with persistent lateral T wave inversions  QRS normal   Assessment and Plan

## 2014-09-20 ENCOUNTER — Encounter: Payer: Self-pay | Admitting: Cardiovascular Disease

## 2014-09-20 ENCOUNTER — Ambulatory Visit (INDEPENDENT_AMBULATORY_CARE_PROVIDER_SITE_OTHER): Payer: BC Managed Care – PPO | Admitting: Cardiovascular Disease

## 2014-09-20 VITALS — BP 122/70 | HR 80 | Ht 68.0 in | Wt 147.7 lb

## 2014-09-20 DIAGNOSIS — I2510 Atherosclerotic heart disease of native coronary artery without angina pectoris: Secondary | ICD-10-CM

## 2014-09-20 DIAGNOSIS — R931 Abnormal findings on diagnostic imaging of heart and coronary circulation: Secondary | ICD-10-CM

## 2014-09-20 DIAGNOSIS — I10 Essential (primary) hypertension: Secondary | ICD-10-CM

## 2014-09-20 DIAGNOSIS — R0602 Shortness of breath: Secondary | ICD-10-CM

## 2014-09-20 DIAGNOSIS — E782 Mixed hyperlipidemia: Secondary | ICD-10-CM

## 2014-09-20 MED ORDER — HYDROCHLOROTHIAZIDE 25 MG PO TABS: 12.5000 mg | ORAL_TABLET | Freq: Every day | ORAL | Status: DC

## 2014-09-20 MED ORDER — CARVEDILOL 6.25 MG PO TABS: 6.2500 mg | ORAL_TABLET | Freq: Two times a day (BID) | ORAL | Status: DC

## 2014-09-20 MED ORDER — LOSARTAN POTASSIUM 100 MG PO TABS: 100.0000 mg | ORAL_TABLET | Freq: Every day | ORAL | Status: DC

## 2014-09-20 MED ORDER — ATORVASTATIN CALCIUM 20 MG PO TABS: 20.0000 mg | ORAL_TABLET | Freq: Every day | ORAL | Status: DC

## 2014-09-20 NOTE — Patient Instructions (Signed)
Your physician wants you to follow-up in:  6 MONTHS WITH DR NISHAN  You will receive a reminder letter in the mail two months in advance. If you don't receive a letter, please call our office to schedule the follow-up appointment. Your physician recommends that you continue on your current medications as directed. Please refer to the Current Medication list given to you today. 

## 2014-09-20 NOTE — Assessment & Plan Note (Signed)
Cholesterol is at goal.  Continue current dose of statin and diet Rx.  No myalgias or side effects.  F/U  LFT's in 6 months. No results found for: LDLCALC           

## 2014-09-20 NOTE — Assessment & Plan Note (Signed)
Unfortunately MRI could not be approved.  Reviewed MUGA and good quality  EF 43% and he does not need AICD  Discussed with Dr Rayann Heman.  ECG with narrow QRS and no need for CRT either

## 2014-09-20 NOTE — Assessment & Plan Note (Signed)
Improved functional BNP normal EF 43% on good Rx stopped smoking and CXR ok

## 2014-09-20 NOTE — Assessment & Plan Note (Signed)
Stable with no angina and good activity level.  Continue medical Rx  

## 2014-09-20 NOTE — Assessment & Plan Note (Signed)
Well controlled.  Continue current medications and low sodium Dash type diet.    

## 2014-10-09 IMAGING — CR DG CHEST 2V
2 series · 2 of 2 positions shown · non-contrast
Comparison: DG CHEST 2 VIEW dated 01/05/2014;

CLINICAL DATA: Coronary atherosclerosis. Postoperative for CABG on
01/01/2014.

EXAM:
CHEST  2 VIEW

[w chest pa]
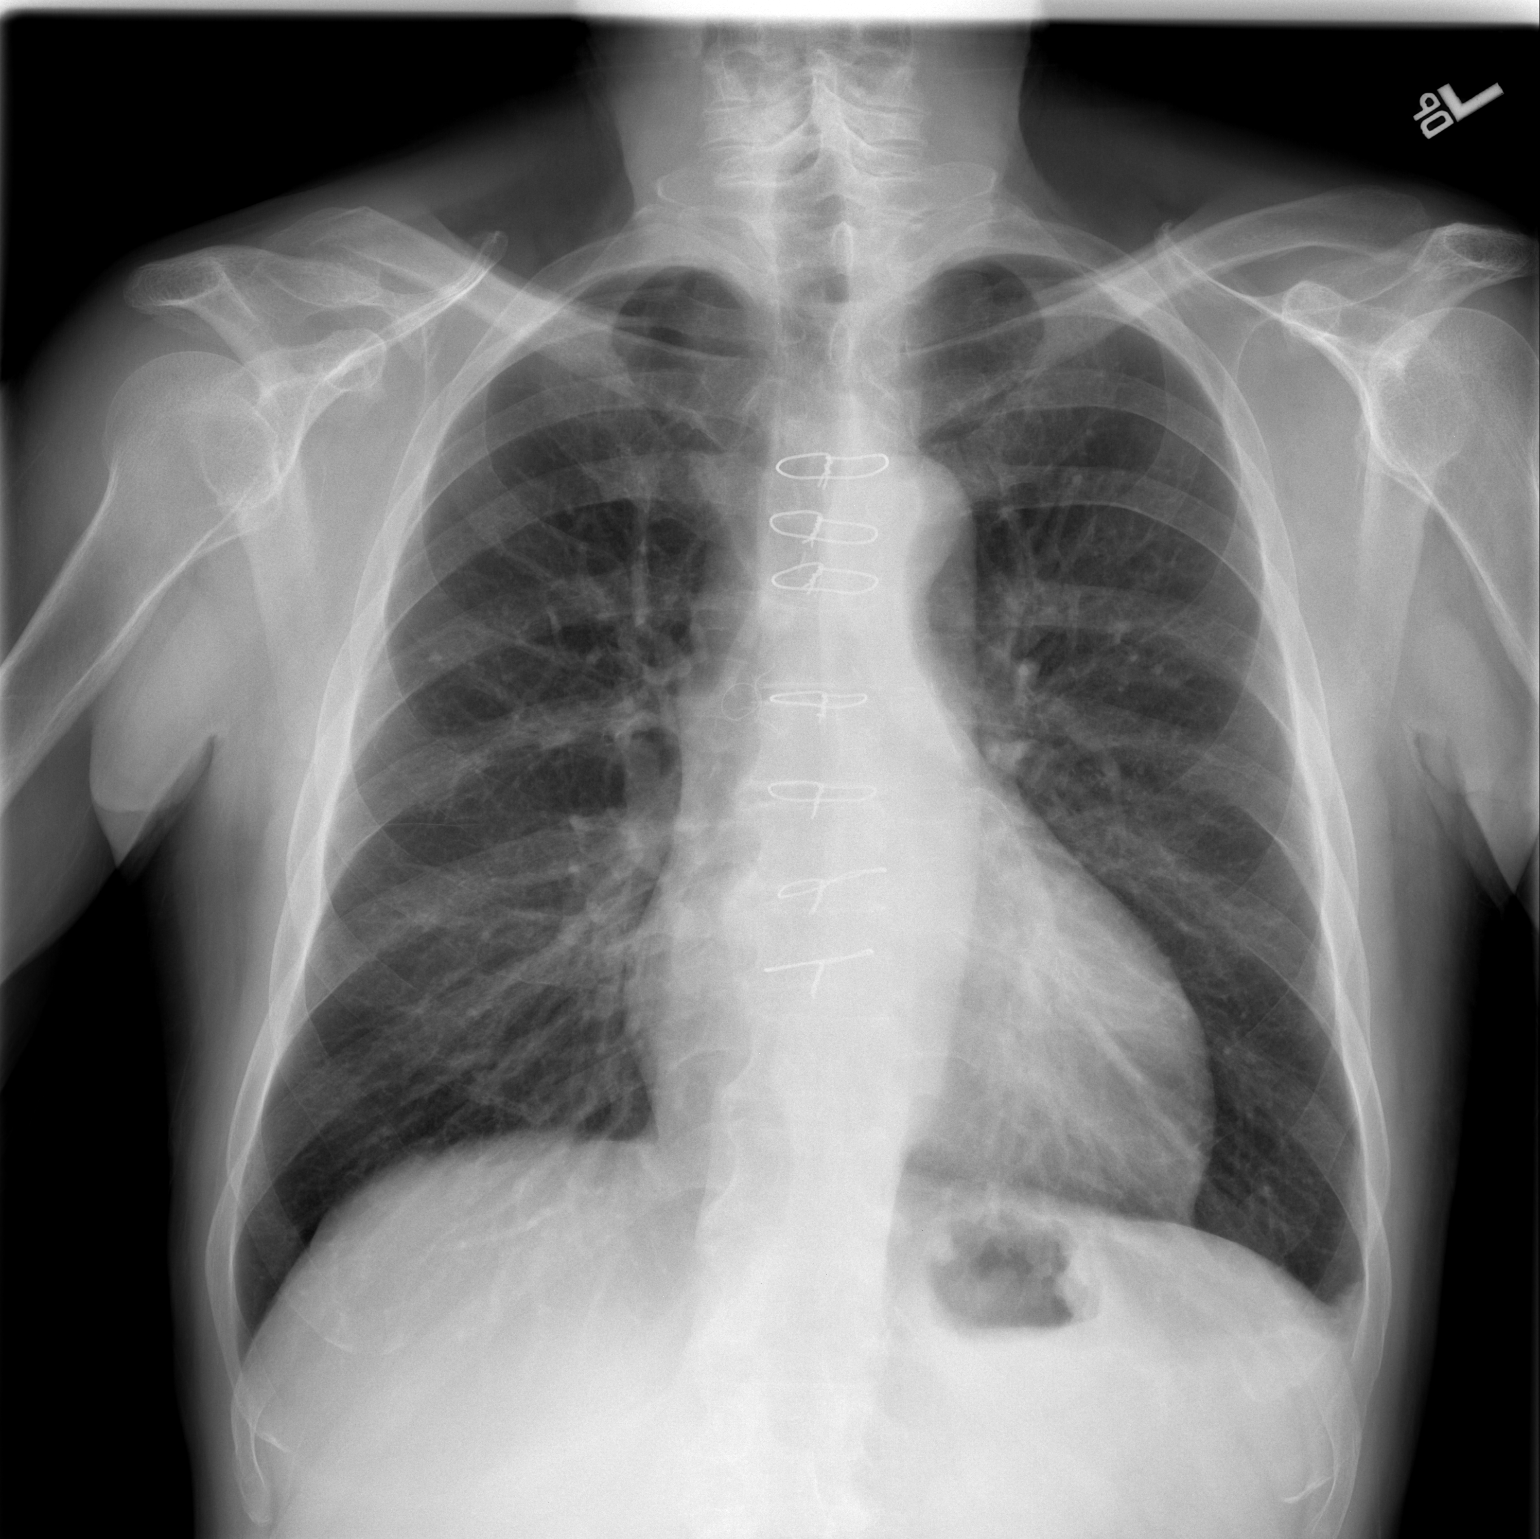

[w chest lat]
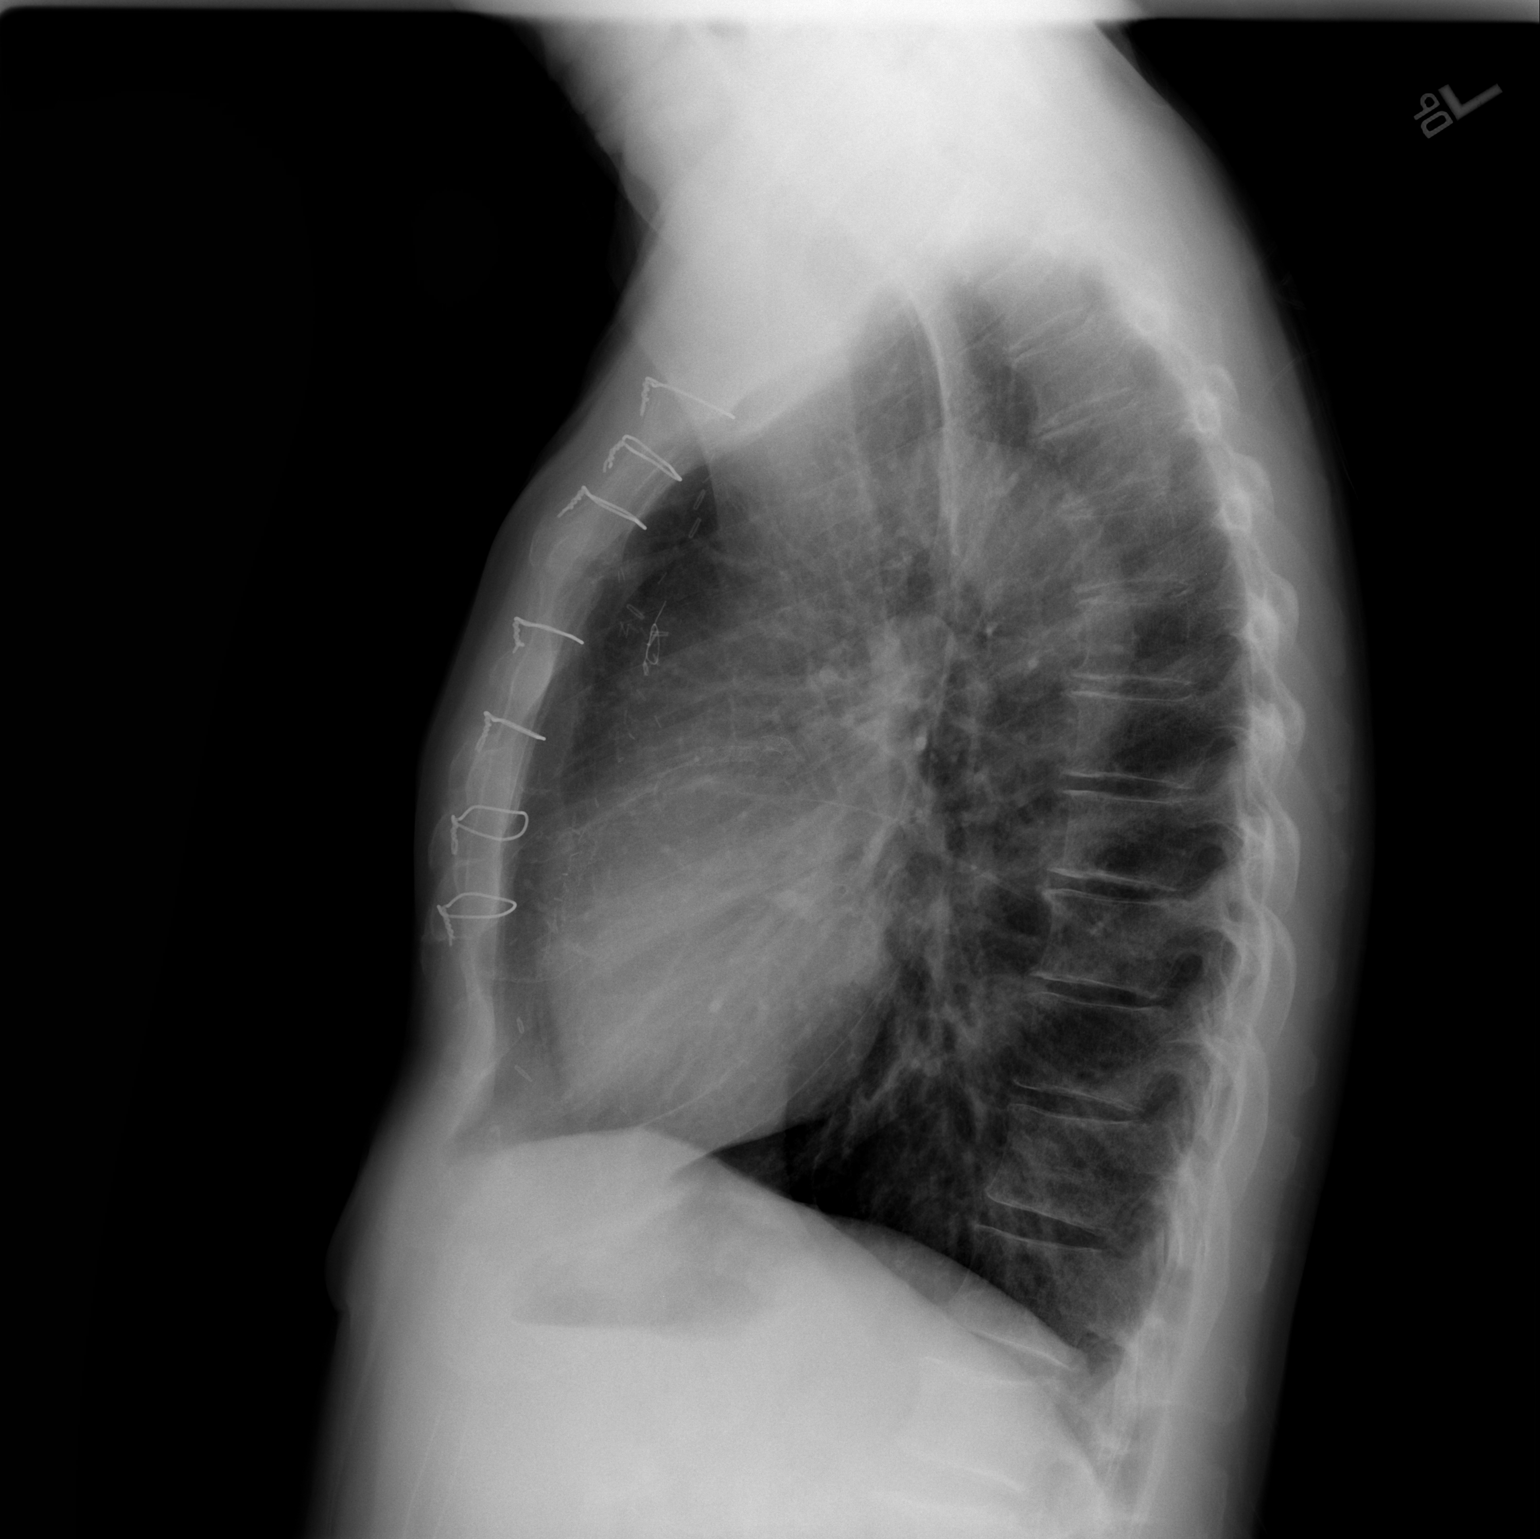

[2 of 2 positions shown; findings below may reference images not displayed]

DG CHEST 2 VIEW dated
12/15/2013; DG CHEST 2 VIEW dated 12/30/2013; DG CHEST 1V PORT dated
01/01/2014
FINDINGS: Prior CABG. Cardiothoracic indexed 52%, compatible with mild
cardiomegaly. No edema. Subtle blunting of the left lateral
costophrenic angle probably from minimal scarring or subsegmental
atelectasis, less likely trace pleural effusion.

Minimal thoracic spondylosis.
IMPRESSION: 1. Mild cardiomegaly, without edema.
2. Minimal lateral blunting of the left costophrenic angle,
potentially from subsegmental atelectasis or trace pleural effusion.
3. Mild thoracic spondylosis.

## 2014-10-14 ENCOUNTER — Encounter (HOSPITAL_COMMUNITY): Payer: Self-pay | Admitting: Cardiovascular Disease

## 2014-11-19 ENCOUNTER — Telehealth: Payer: Self-pay | Admitting: *Deleted

## 2014-11-19 NOTE — Telephone Encounter (Signed)
SPOKE WITH PT  RE   DISABILITY  FORMS  PER  DR NISHAN FROM HEART  STANDPOINT PT  IS  NOT  DISABLED   PER  PT  IS  CURRENTLY  RECEIVING   DISABILITY  BENEFITS  UNDER  ANOTHER   MD'S  CARE .[name]

## 2015-04-05 ENCOUNTER — Other Ambulatory Visit: Payer: Self-pay | Admitting: *Deleted

## 2015-04-05 DIAGNOSIS — E782 Mixed hyperlipidemia: Secondary | ICD-10-CM

## 2015-04-05 MED ORDER — ATORVASTATIN CALCIUM 20 MG PO TABS: 20.0000 mg | ORAL_TABLET | Freq: Every day | ORAL | Status: DC

## 2015-05-12 ENCOUNTER — Other Ambulatory Visit: Payer: Self-pay | Admitting: *Deleted

## 2015-05-12 DIAGNOSIS — I10 Essential (primary) hypertension: Secondary | ICD-10-CM

## 2015-05-12 MED ORDER — LOSARTAN POTASSIUM 100 MG PO TABS: 100.0000 mg | ORAL_TABLET | Freq: Every day | ORAL | Status: DC

## 2015-06-02 ENCOUNTER — Encounter: Payer: Self-pay | Admitting: Gastroenterology

## 2015-06-20 ENCOUNTER — Other Ambulatory Visit: Payer: Self-pay

## 2015-06-20 DIAGNOSIS — I10 Essential (primary) hypertension: Secondary | ICD-10-CM

## 2015-06-20 MED ORDER — LOSARTAN POTASSIUM 100 MG PO TABS: 100.0000 mg | ORAL_TABLET | Freq: Every day | ORAL | Status: DC

## 2015-06-22 ENCOUNTER — Ambulatory Visit: Payer: Self-pay | Admitting: Cardiovascular Disease

## 2015-07-06 NOTE — Progress Notes (Signed)
Patient ID: [name], male   DOB: 1956/08/07, 59 y.o.   MRN: [id number] 59 y.o. male with history of CAD s/p bare metal stent to RCA in 2002 and 2008 and bare metal stent LAD in 2002 and 2008, ICM (EF ~ 40%), HTN, HL, OSA, GERD, tobacco abuse with recent dyspnea on exertion concerning for angina. . Recent stress myoview with anterior and inferior wall scar with reduced LVEF, no clear ischemia. Cardiac cath done 12/22/13  With progressive disease and CABG recommended   CABG 01/01/14  with Dr Cyndia Bent This included LIMA to LAD and SVG to PDA EF 30-35% by echo   Has not smoked since surgery   Cozaar and coreg increased last visit Home readings reviewed and much better   Echo 05/27/14 EF 35-40%  MUGA  06/10/14  EF 43%  Seein by Doctor Allred and felt to be not a candidate for CRT or AICD  Going to [employer] for Thanksgiving   Having symptoms of bilateral carpel tunnel  ROS: Denies fever, malais, weight loss, blurry vision, decreased visual acuity, cough, sputum, SOB, hemoptysis, pleuritic pain, palpitaitons, heartburn, abdominal pain, melena, lower extremity edema, claudication, or rash.  All other systems reviewed and negative  General: Affect appropriate Healthy:  appears stated age 89: normal Neck supple with no adenopathy JVP normal no bruits no thyromegaly Lungs clear with no wheezing and good diaphragmatic motion Heart:  S1/S2 no murmur, no rub, gallop or click PMI normal Abdomen: benighn, BS positve, no tenderness, no AAA no bruit.  No HSM or HJR Distal pulses intact with no bruits No edema Neuro non-focal Skin warm and dry No muscular weakness   Current Outpatient Prescriptions  Medication Sig Dispense Refill  . aspirin 325 MG tablet Take 1 tablet (325 mg total) by mouth daily. 10 tablet 0  . atorvastatin (LIPITOR) 20 MG tablet Take 1 tablet (20 mg total) by mouth daily. 90 tablet 0  . carvedilol (COREG) 6.25 MG tablet Take 1 tablet (6.25 mg total) by mouth 2  (two) times daily with a meal. 180 tablet 3  . hydrochlorothiazide (HYDRODIURIL) 25 MG tablet Take 0.5 tablets (12.5 mg total) by mouth daily. 45 tablet 3  . losartan (COZAAR) 100 MG tablet Take 1 tablet (100 mg total) by mouth daily. 90 tablet 0   No current facility-administered medications for this visit.    Allergies  Review of patient's allergies indicates no known allergies.  Electrocardiogram:  SR rate 62 anterolateral MI with persistent lateral T wave inversions  QRS normal  SR ate 73  Anterolateral T wave changes persist   Assessment and Plan CAD: Stable with no angina and good activity level.  Continue medical Rx  ECG abnormal since  DCM  euvolemic functional class one continue current meds Chol: No results found for: [place] lasbs with Dr Tollie Pizza HTN: Well controlled.  Continue current medications and low sodium Dash type diet.   Carpel Tunnel:  Symptoms in hands sound like carpel tunnel refer to hand surgeon Dr Amedeo Plenty  Needs median nerve testing

## 2015-07-07 ENCOUNTER — Ambulatory Visit (INDEPENDENT_AMBULATORY_CARE_PROVIDER_SITE_OTHER): Payer: 59 | Admitting: Cardiovascular Disease

## 2015-07-07 ENCOUNTER — Encounter: Payer: Self-pay | Admitting: Cardiovascular Disease

## 2015-07-07 VITALS — BP 119/80 | HR 73 | Ht 68.0 in | Wt 146.4 lb

## 2015-07-07 DIAGNOSIS — I10 Essential (primary) hypertension: Secondary | ICD-10-CM | POA: Diagnosis not present

## 2015-07-07 DIAGNOSIS — R202 Paresthesia of skin: Secondary | ICD-10-CM

## 2015-07-07 NOTE — Addendum Note (Signed)
Addended by: Freada Bergeron on: 07/07/2015 02:51 PM   Modules accepted: Orders

## 2015-07-07 NOTE — Patient Instructions (Signed)
Medication Instructions:  No changes  Labwork: NONE  Testing/Procedures: NONE  Follow-Up: Your physician wants you to follow-up in:  6 MONTHS WITH  DR NISHAN You will receive a reminder letter in the mail two months in advance. If you don't receive a letter, please call our office to schedule the follow-up appointment.  Any Other Special Instructions Will Be Listed Below (If Applicable).   

## 2015-07-07 NOTE — Addendum Note (Signed)
Addended by: Devra Dopp E on: 07/07/2015 09:48 AM   Modules accepted: Orders

## 2015-07-09 ENCOUNTER — Other Ambulatory Visit: Payer: Self-pay | Admitting: Cardiovascular Disease

## 2015-07-16 ENCOUNTER — Other Ambulatory Visit: Payer: Self-pay | Admitting: Cardiovascular Disease

## 2015-07-18 ENCOUNTER — Other Ambulatory Visit: Payer: Self-pay

## 2015-07-18 DIAGNOSIS — E782 Mixed hyperlipidemia: Secondary | ICD-10-CM

## 2015-07-18 MED ORDER — ATORVASTATIN CALCIUM 20 MG PO TABS: 20.0000 mg | ORAL_TABLET | Freq: Every day | ORAL | Status: DC

## 2015-09-19 ENCOUNTER — Other Ambulatory Visit: Payer: Self-pay | Admitting: Cardiovascular Disease

## 2015-09-20 ENCOUNTER — Other Ambulatory Visit: Payer: Self-pay | Admitting: Cardiovascular Disease

## 2015-09-20 DIAGNOSIS — I10 Essential (primary) hypertension: Secondary | ICD-10-CM

## 2015-09-20 MED ORDER — LOSARTAN POTASSIUM 100 MG PO TABS: 100.0000 mg | ORAL_TABLET | Freq: Every day | ORAL | Status: DC

## 2015-10-17 ENCOUNTER — Telehealth: Payer: Self-pay | Admitting: Cardiovascular Disease

## 2015-10-17 DIAGNOSIS — R0989 Other specified symptoms and signs involving the circulatory and respiratory systems: Secondary | ICD-10-CM

## 2015-10-17 NOTE — Telephone Encounter (Signed)
Had normal carotids and UE circulation 2015 pre CABG Can order ABI"s and LE arterial duplex to check his circulation in legs

## 2015-10-17 NOTE — Telephone Encounter (Signed)
Spoke with patient who states he called to get an appointment with Dr. Johnsie Cancel and was told there are no openings until March.  He c/o difficulty walking on both legs due to stiffness and wonders if it is related to circulation.  He denies numbness in legs, c/o some numbness in toes.  Denies color change; states right foot feels cold and tingles most of the time.  He states at last ov 9/16 he c/o right hand numbness and Dr. Johnsie Cancel referred him to Dr. Amedeo Plenty, orthopedics.  He states he got a cortisone injection which did not help.  He states he has also seen a neurologist who told him he did not have nerve damage.  He has a follow-up appointment with Dr. Amedeo Plenty this Friday but is concerned about his circulation since he now also has problems with his legs.  Due for follow-up March 2017.  I advised him that Dr. Johnsie Cancel is working in the hospital this week and that I will route message to him for advice.  I advised someone from our office would call back with his advice.  He verbalized understanding and agreement.

## 2015-10-17 NOTE — Telephone Encounter (Signed)
New problem    Pt is having numbness on rt side and having a hard time walking that has been going on for 53months. Please call pt.

## 2015-10-18 NOTE — Telephone Encounter (Signed)
Called patient, no answer and no way to leave message. Will try to call later. Ordered patient's LE arterial duplex with ABI. Will send message to scheduler.

## 2015-10-19 ENCOUNTER — Other Ambulatory Visit: Payer: Self-pay | Admitting: Cardiovascular Disease

## 2015-10-19 DIAGNOSIS — R200 Anesthesia of skin: Secondary | ICD-10-CM

## 2015-10-19 NOTE — Telephone Encounter (Signed)
Patient is scheduled for LE arterial with ABI. Confirmed patient's appointment and informed him of Dr. Kyla Balzarine message. Patient verbalized understanding.

## 2015-10-24 ENCOUNTER — Telehealth: Payer: Self-pay | Admitting: Cardiovascular Disease

## 2015-10-24 ENCOUNTER — Other Ambulatory Visit: Payer: Self-pay | Admitting: *Deleted

## 2015-10-24 DIAGNOSIS — I2510 Atherosclerotic heart disease of native coronary artery without angina pectoris: Secondary | ICD-10-CM

## 2015-10-24 DIAGNOSIS — I10 Essential (primary) hypertension: Secondary | ICD-10-CM

## 2015-10-24 MED ORDER — CARVEDILOL 6.25 MG PO TABS: 6.2500 mg | ORAL_TABLET | Freq: Two times a day (BID) | ORAL | Status: DC

## 2015-10-24 MED ORDER — HYDROCHLOROTHIAZIDE 25 MG PO TABS: 12.5000 mg | ORAL_TABLET | Freq: Every day | ORAL | Status: DC

## 2015-10-24 NOTE — Telephone Encounter (Signed)
Rx's sent in. °

## 2015-10-24 NOTE — Telephone Encounter (Signed)
New Message    *STAT* If patient is at the pharmacy, call can be transferred to refill team.   1. Which medications need to be refilled? (please list name of each medication and dose if known) hydrochlorothiaziade 25mg    Carvedilol 6.25mg    2. Which pharmacy/location (including street and city if local pharmacy) is medication to be sent to? [facility] [place]  3. Do they need a 30 day or 90 day supply? [place]

## 2015-10-26 ENCOUNTER — Ambulatory Visit (HOSPITAL_COMMUNITY)
Admission: RE | Admit: 2015-10-26 | Discharge: 2015-10-26 | Disposition: A | Discharge status: Home / Self Care | Payer: 59 | Source: Ambulatory Visit | Attending: Cardiovascular Disease | Admitting: Cardiovascular Disease

## 2015-10-26 DIAGNOSIS — R200 Anesthesia of skin: Secondary | ICD-10-CM | POA: Diagnosis not present

## 2015-10-26 DIAGNOSIS — E782 Mixed hyperlipidemia: Secondary | ICD-10-CM | POA: Insufficient documentation

## 2015-10-26 DIAGNOSIS — I10 Essential (primary) hypertension: Secondary | ICD-10-CM | POA: Diagnosis not present

## 2015-11-25 ENCOUNTER — Encounter: Payer: Self-pay | Admitting: *Deleted

## 2015-12-06 ENCOUNTER — Encounter: Payer: Self-pay | Admitting: Cardiovascular Disease

## 2015-12-06 NOTE — Progress Notes (Signed)
Patient ID: [name], male   DOB: [birth year], 60 y.o.   MRN: [id number]   60 y.o. male with history of CAD s/p bare metal stent to RCA in 2002 and 2008 and bare metal stent LAD in 2002 and 2008, ICM (EF ~ 40%), HTN, HL, OSA, GERD, tobacco abuse with recent dyspnea on exertion concerning for angina. . Recent stress myoview with anterior and inferior wall scar with reduced LVEF, no clear ischemia. Cardiac cath done 12/22/13  With progressive disease and CABG recommended   CABG 01/01/14  with Dr Cyndia Bent This included LIMA to LAD and SVG to PDA EF 30-35% by echo   Has not smoked since surgery   Cozaar and coreg increased last visit Home readings reviewed and much better   Echo 05/27/14 EF 35-40%  MUGA  06/10/14  EF 43% 10/26/15  ABI's normal   Seein by Doctor Allred and felt to be not a candidate for CRT or AICD  Going to [employer] for Thanksgiving   Having symptoms of bilateral carpel tunnel  Also Lower back with bulging disc  ROS: Denies fever, malais, weight loss, blurry vision, decreased visual acuity, cough, sputum, SOB, hemoptysis, pleuritic pain, palpitaitons, heartburn, abdominal pain, melena, lower extremity edema, claudication, or rash.  All other systems reviewed and negative  General: Affect appropriate Healthy:  appears stated age 8: normal Neck supple with no adenopathy JVP normal no bruits no thyromegaly Lungs clear with no wheezing and good diaphragmatic motion Heart:  S1/S2 no murmur, no rub, gallop or click PMI normal Abdomen: benighn, BS positve, no tenderness, no AAA no bruit.  No HSM or HJR Distal pulses intact with no bruits No edema Neuro non-focal Skin warm and dry No muscular weakness   Current Outpatient Prescriptions  Medication Sig Dispense Refill  . aspirin 325 MG tablet Take 1 tablet (325 mg total) by mouth daily. 10 tablet 0  . atorvastatin (LIPITOR) 20 MG tablet Take 1 tablet (20 mg total) by mouth daily. 90 tablet 1  . carvedilol  (COREG) 3.125 MG tablet Take 1 tablet (3.125 mg total) by mouth 2 (two) times daily with a meal. 60 tablet 11  . losartan (COZAAR) 50 MG tablet Take 1 tablet (50 mg total) by mouth daily. 30 tablet 11   No current facility-administered medications for this visit.    Allergies  Review of patient's allergies indicates no known allergies.  Electrocardiogram:  SR rate 62 anterolateral MI with persistent lateral T wave inversions  QRS normal  SR ate 73  Anterolateral T wave changes persist   Assessment and Plan CAD: Stable with no angina and good activity level.  Continue medical Rx   DCM  euvolemic functional class one continue current meds EF 43% MUGA 2015  Will stop diuretic an decrease ARB due to low BP Chol: No results found for: [place] lasbs with Dr Tollie Pizza HTN: Well controlled.  Continue current medications and low sodium Dash type diet.   Carpel Tunnel:  Symptoms in hands sound like carpel tunnel refer to hand surgeon Dr Amedeo Plenty  Needs median nerve testing vs cervical spine Back Pain:  F/u [facility] for injection.  Gave him Dr Windy Carina name    Jenkins Rouge

## 2015-12-08 ENCOUNTER — Ambulatory Visit (INDEPENDENT_AMBULATORY_CARE_PROVIDER_SITE_OTHER): Payer: BLUE CROSS/BLUE SHIELD | Admitting: Cardiovascular Disease

## 2015-12-08 ENCOUNTER — Encounter: Payer: Self-pay | Admitting: Cardiovascular Disease

## 2015-12-08 VITALS — BP 84/50 | HR 73 | Ht 68.0 in | Wt 148.4 lb

## 2015-12-08 DIAGNOSIS — I429 Cardiomyopathy, unspecified: Secondary | ICD-10-CM

## 2015-12-08 DIAGNOSIS — I10 Essential (primary) hypertension: Secondary | ICD-10-CM

## 2015-12-08 MED ORDER — CARVEDILOL 3.125 MG PO TABS: 3.1250 mg | ORAL_TABLET | Freq: Two times a day (BID) | ORAL | Status: DC

## 2015-12-08 MED ORDER — LOSARTAN POTASSIUM 50 MG PO TABS: 50.0000 mg | ORAL_TABLET | Freq: Every day | ORAL | Status: DC

## 2015-12-08 NOTE — Patient Instructions (Signed)
Medication Instructions:  Your physician has recommended you make the following change in your medication:  1- Decrease Cozaar 50 mg by mouth daily 2-Decrease Coreg 3.125 mg by mouth twice daily 3-STOP hydrochlorothiazide  Lab work: NONE  Testing/Procedures: NONE  Follow-Up: Your physician wants you to follow-up next available with Dr. Johnsie Cancel.  If you need a refill on your cardiac medications before your next appointment, please call your pharmacy.  You have been referred to Dr. Kary Kos with [facility] for back surgery. [phone]

## 2015-12-09 DIAGNOSIS — I429 Cardiomyopathy, unspecified: Secondary | ICD-10-CM | POA: Insufficient documentation

## 2016-01-10 NOTE — Progress Notes (Signed)
Patient ID: [name], male   DOB: 1956-06-03, 60 y.o.   MRN: [id number]   60 y.o. male with history of CAD s/p bare metal stent to RCA in 2002 and 2008 and bare metal stent LAD in 2002 and 2008, ICM (EF ~ 40%), HTN, HL, OSA, GERD, tobacco abuse 2015 stress myoview with anterior and inferior wall scar with reduced LVEF, no clear ischemia. Cardiac cath done 12/22/13 with progressive disease and CABG recommended   CABG 01/01/14  with Dr Cyndia Bent This included LIMA to LAD and SVG to PDA EF 30-35% by echo   Has not smoked since surgery   Echo 05/27/14 EF 35-40%  MUGA  06/10/14  EF 43% 10/26/15  ABI's normal    Seein by Doctor Allred and felt to be not a candidate for CRT or AICD   Having symptoms of bilateral carpel tunnel  Also Lower back with bulging disc  Diuretic stopped  and ARB lowered last visit for low BP  Better now   No chest pain.  Had a nice talk about donuts He likes [employer] [facility] [place]: Denies fever, malais, weight loss, blurry vision, decreased visual acuity, cough, sputum, SOB, hemoptysis, pleuritic pain, palpitaitons, heartburn, abdominal pain, melena, lower extremity edema, claudication, or rash.  All other systems reviewed and negative  General: Affect appropriate Healthy:  appears stated age 6: normal Neck supple with no adenopathy JVP normal no bruits no thyromegaly Lungs clear with no wheezing and good diaphragmatic motion Heart:  S1/S2 no murmur, no rub, gallop or click PMI normal Abdomen: benighn, BS positve, no tenderness, no AAA no bruit.  No HSM or HJR Distal pulses intact with no bruits No edema Neuro non-focal Skin warm and dry No muscular weakness   Current Outpatient Prescriptions  Medication Sig Dispense Refill  . aspirin 325 MG tablet Take 1 tablet (325 mg total) by mouth daily. 10 tablet 0  . atorvastatin (LIPITOR) 20 MG tablet Take 1 tablet (20 mg total) by mouth daily. 90 tablet 1  . carvedilol (COREG) 3.125 MG tablet Take 1  tablet (3.125 mg total) by mouth 2 (two) times daily with a meal. 60 tablet 11  . losartan (COZAAR) 50 MG tablet Take 1 tablet (50 mg total) by mouth daily. 30 tablet 11   No current facility-administered medications for this visit.    Allergies  Review of patient's allergies indicates no known allergies.  Electrocardiogram:  06/24/14  SR rate 62 anterolateral MI with persistent lateral T wave inversions  QRS normal    07/07/15 SR ate 73  Anterolateral T wave changes persist   Assessment and Plan CAD: Stable with no angina and good activity level.  Continue medical Rx   DCM  euvolemic functional class one continue current meds EF 43% MUGA 2015  Chol: No results found for: [place] lasbs with Dr Tollie Pizza HTN: Well controlled.  Continue current medications and low sodium Dash type diet.   Carpel Tunnel:  Vs cervical spine disease with see Dr Rolena Infante to get MRI of c spine  Back Pain:  F/u [facility] for injection.  Swimming and doing PT  F/u Dr Arlan Organ

## 2016-01-13 ENCOUNTER — Encounter: Payer: Self-pay | Admitting: Cardiovascular Disease

## 2016-01-16 ENCOUNTER — Ambulatory Visit (INDEPENDENT_AMBULATORY_CARE_PROVIDER_SITE_OTHER): Payer: BLUE CROSS/BLUE SHIELD | Admitting: Cardiovascular Disease

## 2016-01-16 ENCOUNTER — Encounter: Payer: Self-pay | Admitting: Cardiovascular Disease

## 2016-01-16 VITALS — BP 136/80 | HR 72 | Ht 68.0 in | Wt 151.0 lb

## 2016-01-16 DIAGNOSIS — I429 Cardiomyopathy, unspecified: Secondary | ICD-10-CM

## 2016-01-16 NOTE — Patient Instructions (Signed)

## 2016-01-26 DIAGNOSIS — K219 Gastro-esophageal reflux disease without esophagitis: Secondary | ICD-10-CM | POA: Insufficient documentation

## 2016-01-26 DIAGNOSIS — L821 Other seborrheic keratosis: Secondary | ICD-10-CM | POA: Insufficient documentation

## 2016-01-26 DIAGNOSIS — R200 Anesthesia of skin: Secondary | ICD-10-CM | POA: Insufficient documentation

## 2016-01-26 DIAGNOSIS — G4730 Sleep apnea, unspecified: Secondary | ICD-10-CM | POA: Insufficient documentation

## 2016-01-30 ENCOUNTER — Other Ambulatory Visit: Payer: Self-pay | Admitting: Cardiovascular Disease

## 2016-02-15 ENCOUNTER — Other Ambulatory Visit: Payer: Self-pay | Admitting: *Deleted

## 2016-02-15 DIAGNOSIS — I10 Essential (primary) hypertension: Secondary | ICD-10-CM

## 2016-02-15 MED ORDER — LOSARTAN POTASSIUM 50 MG PO TABS: 50.0000 mg | ORAL_TABLET | Freq: Every day | ORAL | Status: DC

## 2016-02-21 ENCOUNTER — Other Ambulatory Visit: Payer: Self-pay

## 2016-02-21 MED ORDER — CARVEDILOL 3.125 MG PO TABS: 3.1250 mg | ORAL_TABLET | Freq: Two times a day (BID) | ORAL | Status: DC

## 2016-02-21 NOTE — Telephone Encounter (Signed)
Josue Hector, MD at 01/10/2016 5:33 PM  carvedilol (COREG) 3.125 MG tabletTake 1 tablet (3.125 mg total) by mouth 2 (two) times daily with a meal Patient Instructions     Medication Instructions:  Your physician recommends that you continue on your current medications as directed. Please refer to the Current Medication list given to you today.

## 2016-03-01 ENCOUNTER — Other Ambulatory Visit: Payer: Self-pay | Admitting: Orthopedic Surgery

## 2016-03-01 DIAGNOSIS — M5441 Lumbago with sciatica, right side: Principal | ICD-10-CM

## 2016-03-01 DIAGNOSIS — G8929 Other chronic pain: Secondary | ICD-10-CM

## 2016-03-05 DIAGNOSIS — I639 Cerebral infarction, unspecified: Secondary | ICD-10-CM

## 2016-03-05 HISTORY — DX: Cerebral infarction, unspecified: I63.9

## 2016-03-09 ENCOUNTER — Telehealth: Payer: Self-pay | Admitting: Cardiovascular Disease

## 2016-03-09 NOTE — Telephone Encounter (Signed)
Walk in pt form- [facility]-clearance dropped off gave to [employer]

## 2016-03-12 ENCOUNTER — Other Ambulatory Visit: Payer: BLUE CROSS/BLUE SHIELD

## 2016-03-13 ENCOUNTER — Ambulatory Visit: Payer: Self-pay | Admitting: Physician Assistant

## 2016-03-16 ENCOUNTER — Inpatient Hospital Stay (HOSPITAL_COMMUNITY)
Admission: EM | Admit: 2016-03-16 | Discharge: 2016-03-18 | DRG: 066 | Disposition: A | Discharge status: Home / Self Care | Payer: BLUE CROSS/BLUE SHIELD | Attending: Family Medicine | Admitting: Family Medicine

## 2016-03-16 ENCOUNTER — Other Ambulatory Visit: Payer: Self-pay

## 2016-03-16 ENCOUNTER — Emergency Department (HOSPITAL_COMMUNITY): Payer: BLUE CROSS/BLUE SHIELD

## 2016-03-16 ENCOUNTER — Encounter (HOSPITAL_COMMUNITY): Payer: Self-pay | Admitting: *Deleted

## 2016-03-16 DIAGNOSIS — E785 Hyperlipidemia, unspecified: Secondary | ICD-10-CM | POA: Diagnosis present

## 2016-03-16 DIAGNOSIS — R42 Dizziness and giddiness: Secondary | ICD-10-CM | POA: Insufficient documentation

## 2016-03-16 DIAGNOSIS — G902 Horner's syndrome: Secondary | ICD-10-CM | POA: Diagnosis present

## 2016-03-16 DIAGNOSIS — I6502 Occlusion and stenosis of left vertebral artery: Secondary | ICD-10-CM | POA: Diagnosis present

## 2016-03-16 DIAGNOSIS — I252 Old myocardial infarction: Secondary | ICD-10-CM

## 2016-03-16 DIAGNOSIS — I639 Cerebral infarction, unspecified: Secondary | ICD-10-CM | POA: Diagnosis not present

## 2016-03-16 DIAGNOSIS — I10 Essential (primary) hypertension: Secondary | ICD-10-CM | POA: Diagnosis not present

## 2016-03-16 DIAGNOSIS — I255 Ischemic cardiomyopathy: Secondary | ICD-10-CM | POA: Diagnosis present

## 2016-03-16 DIAGNOSIS — F17200 Nicotine dependence, unspecified, uncomplicated: Secondary | ICD-10-CM | POA: Diagnosis not present

## 2016-03-16 DIAGNOSIS — K219 Gastro-esophageal reflux disease without esophagitis: Secondary | ICD-10-CM | POA: Diagnosis present

## 2016-03-16 DIAGNOSIS — Z885 Allergy status to narcotic agent status: Secondary | ICD-10-CM

## 2016-03-16 DIAGNOSIS — I2510 Atherosclerotic heart disease of native coronary artery without angina pectoris: Secondary | ICD-10-CM | POA: Diagnosis present

## 2016-03-16 DIAGNOSIS — E782 Mixed hyperlipidemia: Secondary | ICD-10-CM | POA: Diagnosis not present

## 2016-03-16 DIAGNOSIS — Z955 Presence of coronary angioplasty implant and graft: Secondary | ICD-10-CM

## 2016-03-16 DIAGNOSIS — Z7982 Long term (current) use of aspirin: Secondary | ICD-10-CM

## 2016-03-16 DIAGNOSIS — R29810 Facial weakness: Secondary | ICD-10-CM | POA: Diagnosis present

## 2016-03-16 DIAGNOSIS — Z720 Tobacco use: Secondary | ICD-10-CM

## 2016-03-16 DIAGNOSIS — Z951 Presence of aortocoronary bypass graft: Secondary | ICD-10-CM

## 2016-03-16 DIAGNOSIS — M1990 Unspecified osteoarthritis, unspecified site: Secondary | ICD-10-CM | POA: Diagnosis present

## 2016-03-16 DIAGNOSIS — F17210 Nicotine dependence, cigarettes, uncomplicated: Secondary | ICD-10-CM | POA: Diagnosis present

## 2016-03-16 DIAGNOSIS — Z9889 Other specified postprocedural states: Secondary | ICD-10-CM

## 2016-03-16 DIAGNOSIS — R1310 Dysphagia, unspecified: Secondary | ICD-10-CM | POA: Diagnosis present

## 2016-03-16 DIAGNOSIS — Z79899 Other long term (current) drug therapy: Secondary | ICD-10-CM

## 2016-03-16 DIAGNOSIS — R270 Ataxia, unspecified: Secondary | ICD-10-CM | POA: Insufficient documentation

## 2016-03-16 DIAGNOSIS — G4733 Obstructive sleep apnea (adult) (pediatric): Secondary | ICD-10-CM | POA: Diagnosis present

## 2016-03-16 LAB — CBC WITH DIFFERENTIAL/PLATELET
Basophils Absolute: 0 10*3/uL (ref 0.0–0.1)
Basophils Relative: 0 %
Eosinophils Absolute: 0 10*3/uL (ref 0.0–0.7)
Eosinophils Relative: 0 %
HCT: 50.2 % (ref 39.0–52.0)
Hemoglobin: 17.1 g/dL — ABNORMAL HIGH (ref 13.0–17.0)
Lymphocytes Relative: 11 %
Lymphs Abs: 1.1 10*3/uL (ref 0.7–4.0)
MCH: 31.8 pg (ref 26.0–34.0)
MCHC: 34.1 g/dL (ref 30.0–36.0)
MCV: 93.3 fL (ref 78.0–100.0)
Monocytes Absolute: 0.5 10*3/uL (ref 0.1–1.0)
Monocytes Relative: 5 %
Neutro Abs: 8.4 10*3/uL — ABNORMAL HIGH (ref 1.7–7.7)
Neutrophils Relative %: 84 %
Platelets: 171 10*3/uL (ref 150–400)
RBC: 5.38 MIL/uL (ref 4.22–5.81)
RDW: 12.4 % (ref 11.5–15.5)
WBC: 10 10*3/uL (ref 4.0–10.5)

## 2016-03-16 LAB — I-STAT CHEM 8, ED
BUN: 10 mg/dL (ref 6–20)
Calcium, Ion: 1.09 mmol/L — ABNORMAL LOW (ref 1.13–1.30)
Chloride: 100 mmol/L — ABNORMAL LOW (ref 101–111)
Creatinine, Ser: 0.7 mg/dL (ref 0.61–1.24)
Glucose, Bld: 111 mg/dL — ABNORMAL HIGH (ref 65–99)
HCT: 55 % — ABNORMAL HIGH (ref 39.0–52.0)
Hemoglobin: 18.7 g/dL — ABNORMAL HIGH (ref 13.0–17.0)
Potassium: 4 mmol/L (ref 3.5–5.1)
Sodium: 140 mmol/L (ref 135–145)
TCO2: 26 mmol/L (ref 0–100)

## 2016-03-16 LAB — I-STAT TROPONIN, ED: Troponin i, poc: 0.02 ng/mL (ref 0.00–0.08)

## 2016-03-16 MED ORDER — LORAZEPAM 2 MG/ML IJ SOLN
1.0000 mg | Freq: Once | INTRAMUSCULAR | Status: AC
  Administered 2016-03-16: 1 mg via INTRAVENOUS
  Filled 2016-03-16: qty 1

## 2016-03-16 MED ORDER — SENNOSIDES-DOCUSATE SODIUM 8.6-50 MG PO TABS: 1.0000 | ORAL_TABLET | Freq: Every evening | ORAL | Status: DC | PRN

## 2016-03-16 MED ORDER — ADULT MULTIVITAMIN W/MINERALS CH
1.0000 | ORAL_TABLET | Freq: Every day | ORAL | Status: DC
  Administered 2016-03-18: 1 via ORAL
  Filled 2016-03-16 (×3): qty 1

## 2016-03-16 MED ORDER — DEXAMETHASONE SODIUM PHOSPHATE 10 MG/ML IJ SOLN
10.0000 mg | Freq: Once | INTRAMUSCULAR | Status: AC
  Administered 2016-03-16: 10 mg via INTRAVENOUS
  Filled 2016-03-16: qty 1

## 2016-03-16 MED ORDER — ATORVASTATIN CALCIUM 20 MG PO TABS: 20.0000 mg | ORAL_TABLET | Freq: Every day | ORAL | Status: DC

## 2016-03-16 MED ORDER — HYDRALAZINE HCL 20 MG/ML IJ SOLN
10.0000 mg | Freq: Once | INTRAMUSCULAR | Status: AC
  Administered 2016-03-16: 10 mg via INTRAVENOUS
  Filled 2016-03-16: qty 1

## 2016-03-16 MED ORDER — METOCLOPRAMIDE HCL 5 MG/ML IJ SOLN
10.0000 mg | Freq: Once | INTRAMUSCULAR | Status: AC
  Administered 2016-03-16: 10 mg via INTRAVENOUS
  Filled 2016-03-16: qty 2

## 2016-03-16 MED ORDER — CETYLPYRIDINIUM CHLORIDE 0.05 % MT LIQD
7.0000 mL | Freq: Two times a day (BID) | OROMUCOSAL | Status: DC
  Administered 2016-03-16 – 2016-03-18 (×4): 7 mL via OROMUCOSAL

## 2016-03-16 MED ORDER — HEPARIN SODIUM (PORCINE) 5000 UNIT/ML IJ SOLN
5000.0000 [IU] | Freq: Three times a day (TID) | INTRAMUSCULAR | Status: DC
  Administered 2016-03-17 – 2016-03-18 (×5): 5000 [IU] via SUBCUTANEOUS
  Filled 2016-03-16 (×4): qty 1

## 2016-03-16 MED ORDER — ASPIRIN 325 MG PO TABS: 325.0000 mg | ORAL_TABLET | Freq: Every day | ORAL | Status: DC

## 2016-03-16 MED ORDER — MECLIZINE HCL 25 MG PO TABS
25.0000 mg | ORAL_TABLET | Freq: Once | ORAL | Status: AC
  Administered 2016-03-16: 25 mg via ORAL
  Filled 2016-03-16: qty 1

## 2016-03-16 MED ORDER — STROKE: EARLY STAGES OF RECOVERY BOOK
Freq: Once | Status: AC
  Administered 2016-03-16: 22:00:00
  Filled 2016-03-16: qty 1

## 2016-03-16 MED ORDER — ASPIRIN 300 MG RE SUPP: 300.0000 mg | Freq: Every day | RECTAL | Status: DC

## 2016-03-16 MED ORDER — ASPIRIN 325 MG PO TABS
325.0000 mg | ORAL_TABLET | Freq: Every day | ORAL | Status: DC
  Administered 2016-03-17 (×2): 325 mg via ORAL
  Filled 2016-03-16 (×2): qty 1

## 2016-03-16 NOTE — H&P (Signed)
TRH H&P   Patient Demographics:    [name], is a 60 y.o. male  MRN: [id number]   DOB - 1956-04-29  Admit Date - 03/16/2016  Outpatient Primary MD for the patient is Stephens Shire, MD  Referring MD/NP/PA: Dr. Maryan Rued  Patient coming from: home  Chief Complaint  Patient presents with  . Nausea  . Emesis  . Headache      HPI:    [name]  is a 60 y.o. male, With history of coronary artery disease status post CABG, hypertension, hyperlipidemia who is presenting with complaints of sudden onset vertigo and unsteadiness on his feet. The problem has been persistent since onset and has not been getting any better. Standing up and trying to walk makes it worse. Otherwise nothing else he knows of makes it better. Given persistence of symptoms patient presented to the ER for further evaluation recommendations. The problem started last evening.  In the ED patient was found to have acute CVA and we were subsequently consulted for medical admission. Of note they have contacted neurology who will follow along    Review of systems:    In addition to the HPI above, No Fever-chills, No Headache, No changes with Vision or hearing, No problems swallowing food or Liquids, No Chest pain, Cough or Shortness of Breath, No Abdominal pain, No Nausea or Vommitting, Bowel movements are regular, No Blood in stool or Urine, No dysuria, No new skin rashes or bruises, No new joints pains-aches,  No new weakness, tingling, numbness in any extremity, No recent weight gain or loss, No polyuria, polydypsia or polyphagia, No significant Mental Stressors.  A full 10 point Review of Systems was done, except as stated above, all other Review of Systems were negative.   With Past History of the following :    Past Medical History  Diagnosis Date  . Mixed hyperlipidemia   . GERD  (gastroesophageal reflux disease)   . Hypertension     under control with med., has been on med. > 12 yr.  . 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 Sleep apnea     uses CPAP nightly  . Dental bridge present     lower  . Ischemic cardiomyopathy     a. Echo 09/2012:  Mod LVH, focal basal hypertrophy, EF 30-35%, mid to dist inf-lat HK, Gr 1 diast dysfn, mild to mod LAE  . CAD (coronary artery disease)     a. s/p prior MI; hx of stent to RCA and LAD;  b. LHC 12/2006:  LM 20%, pLAD 40%, mLAD stent ok, apical LAD 90% (1.5-46mm vessel), oD1 40-50%, AV groove CFX 30%, oOM1 30%, pRCA 30% (multiple), mRCA stent ok with 30-40% ISR, dRCA 30%, dAnt, inf-apical severe HK, EF 40% => med Rx.;  12/2013 Cath: LM nl, LAD 95ost, 40m ISR, 99apex, LCX nl, RCA 40p, 100 ISR, L->R  collats, EF 35-40%-->pending CABG  . Hx of cardiovascular stress test     a.  Myoview (02/2010): EF 41%, septal, apical and inf infarct, no ischemia;   b.  Lexiscan Myoview (12/02/13): Large fixed perfusion defect in the RCA and LAD territory, no ischemia, EF 40%.  . Tobacco abuse   . Myocardial infarction ([place])     x2  . Arthritis       Past Surgical History  Procedure Laterality Date  . Knee arthroscopy      left knee x 1, right knee x 2  . Coronary angioplasty with stent placement  05/22/2001    PCI - RCA - LAD  . Coronary angioplasty with stent placement  11/11/2006    PCI - BMS - LAD  . Coronary angioplasty with stent placement  11/15/2006    PCI - BMS - RCA  . Coronary angioplasty with stent placement  03/16/2010  . Cardiac catheterization  06/27/2001; 12/13/2006; 02/20/2010  . Open reduction internal fixation (orif) scaphoid with iliac crest bone graft  10/15/2012    Procedure: OPEN REDUCTION INTERNAL FIXATION (ORIF) SCAPHOID WITH ILIAC CREST BONE GRAFT;  Surgeon: Schuyler Amor, MD;  Location: [facility];  Service: Orthopedics;  Laterality: Right;  No iliac creast bone graft taken, used graft from  radius  . Hand surgery Right     screws  . Foot surgery Right     achilles tendon reattached  . Coronary artery bypass graft N/A 01/01/2014    Procedure: Coronary artery bypass graft times two using left internal mammary artery and left leg greater saphenous vein harvested endoscopically.;  Surgeon: Gaye Pollack, MD;  Location: [facility] OR;  Service: Open Heart Surgery;  Laterality: N/A;  . Intraoperative transesophageal echocardiogram N/A 01/01/2014    Procedure: INTRAOPERATIVE TRANSESOPHAGEAL ECHOCARDIOGRAM;  Surgeon: Gaye Pollack, MD;  Location: [facility] OR;  Service: Open Heart Surgery;  Laterality: N/A;  . Left heart catheterization with coronary angiogram N/A 12/21/2013    Procedure: LEFT HEART CATHETERIZATION WITH CORONARY ANGIOGRAM;  Surgeon: Burnell Blanks, MD;  Location: [facility] CATH LAB;  Service: Cardiovascular;  Laterality: N/A;      Social History:     Social History  Substance Use Topics  . Smoking status: Former Smoker -- 1.00 packs/day for 20 years    Types: Cigarettes  . Smokeless tobacco: Never Used  . Alcohol Use: Yes     Comment: occasional     Lives - home  Mobility - was relatively normal prior to presentation     Family History :     Family History  Problem Relation Age of Onset  . Heart disease Father   . Heart disease Maternal Grandmother   . Heart disease Maternal Grandfather   . Heart disease Paternal Grandmother   . Heart disease Paternal Grandfather    **   Home Medications:   Prior to Admission medications   Medication Sig Start Date End Date Taking? Authorizing Provider  aspirin 325 MG tablet Take 1 tablet (325 mg total) by mouth daily. 09/19/12  Yes Josue Hector, MD  atorvastatin (LIPITOR) 20 MG tablet TAKE 1 TABLET (20 MG TOTAL) BY MOUTH DAILY. 01/30/16  Yes Josue Hector, MD  carvedilol (COREG) 3.125 MG tablet Take 1 tablet (3.125 mg total) by mouth 2 (two) times daily with a meal. 02/21/16  Yes Josue Hector, MD    HYDROcodone-acetaminophen (NORCO/VICODIN) 5-325 MG tablet Take 1 tablet by mouth every 6 (six) hours as needed for  moderate pain.   Yes Historical Provider, MD  losartan (COZAAR) 50 MG tablet Take 1 tablet (50 mg total) by mouth daily. 02/15/16  Yes Josue Hector, MD  Multiple Vitamins-Minerals (MULTIVITAMIN WITH MINERALS) tablet Take 1 tablet by mouth daily.   Yes Historical Provider, MD  triamcinolone ointment (KENALOG) 0.5 % Apply 1 application topically 2 (two) times daily. 02/07/16  Yes Historical Provider, MD     Allergies:     Allergies  Allergen Reactions  . Hydrocodone     Throat itchy, weakness     Physical Exam:   Vitals  Blood pressure 183/114, pulse 81, temperature 97.7 F (36.5 C), temperature source Oral, resp. rate 19, SpO2 97 %.   1. General lying in bed in NAD,    2. Normal affect and insight, Not Suicidal or Homicidal, Awake Alert, Oriented X 3.  3.  Sensation intact all 4 extremities, answers questions appropriately  4. Ears and Eyes appear Normal, Conjunctivae clear, PERRLA. Moist Oral Mucosa.  5. Supple Neck, No JVD, No cervical lymphadenopathy appriciated, No Carotid Bruits.  6. Symmetrical Chest wall movement, Good air movement bilaterally, CTAB. Old midline scar  7. RRR, No Gallops, Rubs or Murmurs,   8. Positive Bowel Sounds, Abdomen Soft, No tenderness, No organomegaly appriciated,No rebound -guarding or rigidity.  9.  No Cyanosis, Normal Skin Turgor, No Skin Rash or Bruise.  10. Good muscle tone,  joints appear normal , no effusions, Normal ROM.  11. No Palpable Lymph Nodes in Neck or Axillae    Data Review:    CBC  Recent Labs Lab 03/16/16 0958 03/16/16 1012  WBC 10.0  --   HGB 17.1* 18.7*  HCT 50.2 55.0*  PLT 171  --   MCV 93.3  --   MCH 31.8  --   MCHC 34.1  --   RDW 12.4  --   LYMPHSABS 1.1  --   MONOABS 0.5  --   EOSABS 0.0  --   BASOSABS 0.0  --     ------------------------------------------------------------------------------------------------------------------  Chemistries   Recent Labs Lab 03/16/16 1012  NA 140  K 4.0  CL 100*  GLUCOSE 111*  BUN 10  CREATININE 0.70   ------------------------------------------------------------------------------------------------------------------ CrCl cannot be calculated (Unknown ideal weight.). ------------------------------------------------------------------------------------------------------------------ No results for input(s): TSH, T4TOTAL, T3FREE, THYROIDAB in the last 72 hours.  Invalid input(s): FREET3  Coagulation profile No results for input(s): INR, PROTIME in the last 168 hours. ------------------------------------------------------------------------------------------------------------------- No results for input(s): DDIMER in the last 72 hours. -------------------------------------------------------------------------------------------------------------------  Cardiac Enzymes No results for input(s): CKMB, TROPONINI, MYOGLOBIN in the last 168 hours.  Invalid input(s): CK ------------------------------------------------------------------------------------------------------------------ No results found for: BNP   ---------------------------------------------------------------------------------------------------------------  Urinalysis    Component Value Date/Time   COLORURINE YELLOW 12/30/2013 [place] 12/30/2013 1017   LABSPEC 1.025 12/30/2013 1017   PHURINE 6.5 12/30/2013 1017   GLUCOSEU NEGATIVE 12/30/2013 1017   HGBUR NEGATIVE 12/30/2013 1017   BILIRUBINUR NEGATIVE 12/30/2013 1017   KETONESUR NEGATIVE 12/30/2013 1017   PROTEINUR NEGATIVE 12/30/2013 1017   UROBILINOGEN 1.0 12/30/2013 1017   NITRITE NEGATIVE 12/30/2013 1017   LEUKOCYTESUR NEGATIVE 12/30/2013 1017     ----------------------------------------------------------------------------------------------------------------   Imaging Results:    Ct Head Wo Contrast  03/16/2016  CLINICAL DATA:  Headache with nausea and vomiting EXAM: CT HEAD WITHOUT CONTRAST TECHNIQUE: Contiguous axial images were obtained from the base of the skull through the vertex without intravenous contrast. COMPARISON:  July 10, 2012 FINDINGS: The ventricles are normal in size  and configuration. There is no intracranial mass, hemorrhage, extra-axial fluid collection, or midline shift. Gray-white compartments appear normal. No acute infarct evident. Bony calvarium appears intact. Mastoid air cells are clear. Orbits appear symmetric bilaterally. There is opacification in anterior right ethmoid air cell. IMPRESSION: Mild right ethmoid sinus disease. No intracranial mass hemorrhage, or focal gray - white compartment lesions/ acute appearing infarct. Electronically Signed   By: Lowella Grip III M.D.   On: 03/16/2016 10:31   Mr Jodene Nam Head Wo Contrast  03/16/2016  CLINICAL DATA:  Recent onset of neck pain and bilateral arm numbness. Acute onset of headache and inability to stand. EXAM: MR HEAD WITHOUT CONTRAST MR CIRCLE OF WILLIS WITHOUT CONTRAST MRA OF THE NECK WITHOUT AND WITH CONTRAST MRI HEAD WITH CONTRAST AND MRA HEAD WITH CONTRAST AND MRI intracranial MR venography TECHNIQUE: Multiplanar, multiecho pulse sequences of the brain and surrounding structures were obtained according to standard protocol without intravenous contrast.; Angiographic images of the Circle of Willis were obtained using MRA technique without intravenous contrast.; intracranial MR venography was performed. : COMPARISON: Head CT same day FINDINGS: MR HEAD FINDINGS There is acute infarction affecting the left side of the medulla. There are a few punctate infarctions within the left cerebellum. Findings are consistent with left PICA distribution infarction. No acute  infarction affecting the cerebral hemispheres. The brain otherwise has a normal appearance. No evidence of old infarction. No mass lesion, hemorrhage, hydrocephalus or extra-axial collection. No flow demonstrated in the left vertebral artery. No pituitary mass. No inflammatory sinus disease. MR CIRCLE OF WILLIS FINDINGS Both internal carotid arteries are widely patent into the brain. The anterior and middle cerebral vessels are patent without proximal stenosis, aneurysm or vascular malformation. There is motion degradation. The right vertebral artery is patent to the basilar. The left vertebral artery is occluded. Flow is present in right PICA. No basilar stenosis. Superior cerebellar arteries and posterior cerebral arteries are patent. MR venography Superior sagittal sinus is patent. Deep veins are patent. Transverse sinuses are patent. Both jugular vein show flow. IMPRESSION: Left vertebral artery occlusion. Acute left PICA distribution infarction affecting the left lateral medulla and areas of the left cerebellum. No swelling or hemorrhage. The remainder the brain is normal. Normal MR venography. Electronically Signed   By: Nelson Chimes M.D.   On: 03/16/2016 15:53   Mr Brain Wo Contrast  03/16/2016  CLINICAL DATA:  Recent onset of neck pain and bilateral arm numbness. Acute onset of headache and inability to stand. EXAM: MR HEAD WITHOUT CONTRAST MR CIRCLE OF WILLIS WITHOUT CONTRAST MRA OF THE NECK WITHOUT AND WITH CONTRAST MRI HEAD WITH CONTRAST AND MRA HEAD WITH CONTRAST AND MRI intracranial MR venography TECHNIQUE: Multiplanar, multiecho pulse sequences of the brain and surrounding structures were obtained according to standard protocol without intravenous contrast.; Angiographic images of the Circle of Willis were obtained using MRA technique without intravenous contrast.; intracranial MR venography was performed. : COMPARISON: Head CT same day FINDINGS: MR HEAD FINDINGS There is acute infarction affecting  the left side of the medulla. There are a few punctate infarctions within the left cerebellum. Findings are consistent with left PICA distribution infarction. No acute infarction affecting the cerebral hemispheres. The brain otherwise has a normal appearance. No evidence of old infarction. No mass lesion, hemorrhage, hydrocephalus or extra-axial collection. No flow demonstrated in the left vertebral artery. No pituitary mass. No inflammatory sinus disease. MR CIRCLE OF WILLIS FINDINGS Both internal carotid arteries are widely patent into the brain. The anterior  and middle cerebral vessels are patent without proximal stenosis, aneurysm or vascular malformation. There is motion degradation. The right vertebral artery is patent to the basilar. The left vertebral artery is occluded. Flow is present in right PICA. No basilar stenosis. Superior cerebellar arteries and posterior cerebral arteries are patent. MR venography Superior sagittal sinus is patent. Deep veins are patent. Transverse sinuses are patent. Both jugular vein show flow. IMPRESSION: Left vertebral artery occlusion. Acute left PICA distribution infarction affecting the left lateral medulla and areas of the left cerebellum. No swelling or hemorrhage. The remainder the brain is normal. Normal MR venography. Electronically Signed   By: Nelson Chimes M.D.   On: 03/16/2016 15:53   Mr Mrv Head Wo Cm  03/16/2016  CLINICAL DATA:  Recent onset of neck pain and bilateral arm numbness. Acute onset of headache and inability to stand. EXAM: MR HEAD WITHOUT CONTRAST MR CIRCLE OF WILLIS WITHOUT CONTRAST MRA OF THE NECK WITHOUT AND WITH CONTRAST MRI HEAD WITH CONTRAST AND MRA HEAD WITH CONTRAST AND MRI intracranial MR venography TECHNIQUE: Multiplanar, multiecho pulse sequences of the brain and surrounding structures were obtained according to standard protocol without intravenous contrast.; Angiographic images of the Circle of Willis were obtained using MRA technique  without intravenous contrast.; intracranial MR venography was performed. : COMPARISON: Head CT same day FINDINGS: MR HEAD FINDINGS There is acute infarction affecting the left side of the medulla. There are a few punctate infarctions within the left cerebellum. Findings are consistent with left PICA distribution infarction. No acute infarction affecting the cerebral hemispheres. The brain otherwise has a normal appearance. No evidence of old infarction. No mass lesion, hemorrhage, hydrocephalus or extra-axial collection. No flow demonstrated in the left vertebral artery. No pituitary mass. No inflammatory sinus disease. MR CIRCLE OF WILLIS FINDINGS Both internal carotid arteries are widely patent into the brain. The anterior and middle cerebral vessels are patent without proximal stenosis, aneurysm or vascular malformation. There is motion degradation. The right vertebral artery is patent to the basilar. The left vertebral artery is occluded. Flow is present in right PICA. No basilar stenosis. Superior cerebellar arteries and posterior cerebral arteries are patent. MR venography Superior sagittal sinus is patent. Deep veins are patent. Transverse sinuses are patent. Both jugular vein show flow. IMPRESSION: Left vertebral artery occlusion. Acute left PICA distribution infarction affecting the left lateral medulla and areas of the left cerebellum. No swelling or hemorrhage. The remainder the brain is normal. Normal MR venography. Electronically Signed   By: Nelson Chimes M.D.   On: 03/16/2016 15:53    My personal review of EKG: Sinus rhythm with inverted T waves at lateral leads   Assessment & Plan:    Active Problems:     Acute CVA (cerebrovascular accident) [facility])  Cerebellar stroke [facility]) - Neurology consulted, will place stroke order set but will defer workup recommendations to neurology. - Fall precaution   Mixed hyperlipidemia - Continue statin    SMOKER - Encourage cessation - Offered patch but  patient refuses    Essential hypertension, benign - We'll hold antihypertensive medication regimen to allow for permissive hypertension given principal problem   DVT Prophylaxis Heparin  AM Labs Ordered, also please review Full Orders  Family Communication: Admission, patients condition and plan of care including tests being ordered have been discussed with the patient who indicate understanding and agree with the plan and Code Status.  Code Status full  Likely DC to  Home   Condition GUARDED    Consults  called: Neurology by ED   Admission status: obs    Time spent in minutes : > 50 minutes   Velvet Bathe M.D on 03/16/2016 at 5:38 PM  Between 7am to 7pm - Pager - [phone] After 7pm go to [email or web address] - password [facility]  [facility] - Office  [phone]

## 2016-03-16 NOTE — ED Notes (Signed)
Estill Bamberg RN made aware of elevated BP

## 2016-03-16 NOTE — ED Notes (Signed)
Had admitting MD paged r/t elevated BP.

## 2016-03-16 NOTE — ED Notes (Signed)
Pt began taking Norco 5-325 for neck pain. He said there is something pressing on his nerve in his neck causing him to have bilateral arm numbness and neck pain. He took the first tablet on Tuesday without any symptoms. He took another one yesterday at 5:00pm, and began to have n/v/headache and could barely stand.  He called EMS today for further follow up.  HX of 2 MI's.  VS per EMS are as follows: HR: 160/100 HR: 56, O2Sat: 98% Resp: 16

## 2016-03-16 NOTE — Consult Note (Signed)
Admission H&P    Chief Complaint: Acute onset of vertigo and nausea as well as slurred speech.  HPI: [name] is an 60 y.o. male history of hypertension, hyperlipidemia, coronary artery disease and arthritis, presenting with new onset vertigo and slurred speech as well as unstable gait. He is also had noticeable difficulty with swallowing. Onset of symptoms was at 5 PM on 03/15/2016. He has no previous history of stroke nor TIA. He's been taking aspirin daily. MRI of the brain showed acute left PICA distribution infarcts involving cerebellum and left lateral medulla. 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 His wife is noted slight droop of the face on the left vision is noted mild sensory changes involving the left side of his face and left hand. NIH stroke score was 3.  LSN: O'clock p.m. on 03/15/2016 tPA Given: No: Beyond time window for treatment consideration mRankin:  Past Medical History  Diagnosis Date  . Mixed hyperlipidemia   . GERD (gastroesophageal reflux disease)   . Hypertension     under control with med., has been on med. > 12 yr.  . 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 Sleep apnea     uses CPAP nightly  . Dental bridge present     lower  . Ischemic cardiomyopathy     a. Echo 09/2012:  Mod LVH, focal basal hypertrophy, EF 30-35%, mid to dist inf-lat HK, Gr 1 diast dysfn, mild to mod LAE  . CAD (coronary artery disease)     a. s/p prior MI; hx of stent to RCA and LAD;  b. LHC 12/2006:  LM 20%, pLAD 40%, mLAD stent ok, apical LAD 90% (1.5-62mm vessel), oD1 40-50%, AV groove CFX 30%, oOM1 30%, pRCA 30% (multiple), mRCA stent ok with 30-40% ISR, dRCA 30%, dAnt, inf-apical severe HK, EF 40% => med Rx.;  12/2013 Cath: LM nl, LAD 95ost, 31m ISR, 99apex, LCX nl, RCA 40p, 100 ISR, L->R collats, EF 35-40%-->pending CABG  . Hx of cardiovascular stress test     a.  Myoview (02/2010): EF 41%, septal, apical and inf infarct, no ischemia;   b.  Lexiscan Myoview  (12/02/13): Large fixed perfusion defect in the RCA and LAD territory, no ischemia, EF 40%.  . Tobacco abuse   . Myocardial infarction ([place])     x2  . Arthritis     Past Surgical History  Procedure Laterality Date  . Knee arthroscopy      left knee x 1, right knee x 2  . Coronary angioplasty with stent placement  05/22/2001    PCI - RCA - LAD  . Coronary angioplasty with stent placement  11/11/2006    PCI - BMS - LAD  . Coronary angioplasty with stent placement  11/15/2006    PCI - BMS - RCA  . Coronary angioplasty with stent placement  03/16/2010  . Cardiac catheterization  06/27/2001; 12/13/2006; 02/20/2010  . Open reduction internal fixation (orif) scaphoid with iliac crest bone graft  10/15/2012    Procedure: OPEN REDUCTION INTERNAL FIXATION (ORIF) SCAPHOID WITH ILIAC CREST BONE GRAFT;  Surgeon: Schuyler Amor, MD;  Location: [facility];  Service: Orthopedics;  Laterality: Right;  No iliac creast bone graft taken, used graft from radius  . Hand surgery Right     screws  . Foot surgery Right     achilles tendon reattached  . Coronary artery bypass graft N/A 01/01/2014    Procedure: Coronary artery bypass graft times two using  left internal mammary artery and left leg greater saphenous vein harvested endoscopically.;  Surgeon: Gaye Pollack, MD;  Location: [facility] OR;  Service: Open Heart Surgery;  Laterality: N/A;  . Intraoperative transesophageal echocardiogram N/A 01/01/2014    Procedure: INTRAOPERATIVE TRANSESOPHAGEAL ECHOCARDIOGRAM;  Surgeon: Gaye Pollack, MD;  Location: [facility] OR;  Service: Open Heart Surgery;  Laterality: N/A;  . Left heart catheterization with coronary angiogram N/A 12/21/2013    Procedure: LEFT HEART CATHETERIZATION WITH CORONARY ANGIOGRAM;  Surgeon: Burnell Blanks, MD;  Location: [facility] C[facility] LAB;  Service: Cardiovascular;  Laterality: N/A;    Family History  Problem Relation Age of Onset  . Heart disease Father   . Heart disease Maternal  Grandmother   . Heart disease Maternal Grandfather   . Heart disease Paternal Grandmother   . Heart disease Paternal Grandfather    Social History:  reports that he has quit smoking. His smoking use included Cigarettes. He has a 20 pack-year smoking history. He has never used smokeless tobacco. He reports that he drinks alcohol. He reports that he does not use illicit drugs.  Allergies:  Allergies  Allergen Reactions  . Hydrocodone     Throat itchy, weakness    Educations: Patient's preadmission medications were reviewed by me.  ROS: History obtained from spouse and the patient  General ROS: negative for - chills, fatigue, fever, night sweats, weight gain or weight loss Psychological ROS: negative for - behavioral disorder, hallucinations, memory difficulties, mood swings or suicidal ideation Ophthalmic ROS: negative for - blurry vision, double vision, eye pain or loss of vision ENT ROS: negative for - epistaxis, nasal discharge, oral lesions, sore throat, tinnitus or vertigo Allergy and Immunology ROS: negative for - hives or itchy/watery eyes Hematological and Lymphatic ROS: negative for - bleeding problems, bruising or swollen lymph nodes Endocrine ROS: negative for - galactorrhea, hair pattern changes, polydipsia/polyuria or temperature intolerance Respiratory ROS: negative for - cough, hemoptysis, shortness of breath or wheezing Cardiovascular ROS: negative for - chest pain, dyspnea on exertion, edema or irregular heartbeat Gastrointestinal ROS: negative for - abdominal pain, diarrhea, hematemesis, nausea/vomiting or stool incontinence Genito-Urinary ROS: negative for - dysuria, hematuria, incontinence or urinary frequency/urgency Musculoskeletal ROS: negative for - joint swelling or muscular weakness Neurological ROS: as noted in HPI Dermatological ROS: negative for rash and skin lesion changes  Physical Examination: Blood pressure 171/89, pulse 58, temperature 97.7 F (36.5  C), temperature source Oral, resp. rate 13, SpO2 97 %.  HEENT-  Normocephalic, no lesions, without obvious abnormality.  Normal external eye and conjunctiva.  Normal TM's bilaterally.  Normal auditory canals and external ears. Normal external nose, mucus membranes and septum.  Normal pharynx. Neck supple with no masses, nodes, nodules or enlargement. Cardiovascular - regular rate and rhythm, S1, S2 normal, no murmur, click, rub or gallop Lungs - chest clear, no wheezing, rales, normal symmetric air entry Abdomen - soft, non-tender; bowel sounds normal; no masses,  no organomegaly Extremities - no joint deformities, effusion, or inflammation and no edema  Neurologic Examination: Mental Status: Alert, oriented, thought content appropriate.  Speech slightly slurred without evidence of aphasia. Able to follow commands without difficulty. Cranial Nerves: II-Visual fields were normal. III/IV/VI-Pupils were equal and reacted normally to light. Extraocular movements were full and conjugate.    V/VII-no facial numbness; mild to moderate left lower facial weakness. VIII-normal. X-mild dysarthria; symmetrical palatal movement. XI: trapezius strength/neck flexion strength normal bilaterally XII-midline tongue extension with normal strength. Motor: 5/5 bilaterally with normal tone and  bulk Sensory: Normal throughout. Deep Tendon Reflexes: 2+ and slightly greater on the right compared to left. Plantars: Flexor bilaterally Cerebellar: Mildly impaired coordination of left upper extremity. Carotid auscultation: Normal  Results for orders placed or performed during the hospital encounter of 03/16/16 (from the past 48 hour(s))  CBC with Differential/Platelet     Status: Abnormal   Collection Time: 03/16/16  9:58 AM  Result Value Ref Range   WBC 10.0 4.0 - 10.5 K/uL   RBC 5.38 4.22 - 5.81 MIL/uL   Hemoglobin 17.1 (H) 13.0 - 17.0 g/dL   HCT 50.2 39.0 - 52.0 %   MCV 93.3 78.0 - 100.0 fL   MCH 31.8  26.0 - 34.0 pg   MCHC 34.1 30.0 - 36.0 g/dL   RDW 12.4 11.5 - 15.5 %   Platelets 171 150 - 400 K/uL   Neutrophils Relative % 84 %   Neutro Abs 8.4 (H) 1.7 - 7.7 K/uL   Lymphocytes Relative 11 %   Lymphs Abs 1.1 0.7 - 4.0 K/uL   Monocytes Relative 5 %   Monocytes Absolute 0.5 0.1 - 1.0 K/uL   Eosinophils Relative 0 %   Eosinophils Absolute 0.0 0.0 - 0.7 K/uL   Basophils Relative 0 %   Basophils Absolute 0.0 0.0 - 0.1 K/uL  I-stat troponin, ED     Status: None   Collection Time: 03/16/16 10:10 AM  Result Value Ref Range   Troponin i, poc 0.02 0.00 - 0.08 ng/mL   Comment 3            Comment: Due to the release kinetics of cTnI, a negative result within the first hours of the onset of symptoms does not rule out myocardial infarction with certainty. If myocardial infarction is still suspected, repeat the test at appropriate intervals.   I-stat chem 8, ed     Status: Abnormal   Collection Time: 03/16/16 10:12 AM  Result Value Ref Range   Sodium 140 135 - 145 mmol/L   Potassium 4.0 3.5 - 5.1 mmol/L   Chloride 100 (L) 101 - 111 mmol/L   BUN 10 6 - 20 mg/dL   Creatinine, Ser 0.70 0.61 - 1.24 mg/dL   Glucose, Bld 111 (H) 65 - 99 mg/dL   Calcium, Ion 1.09 (L) 1.13 - 1.30 mmol/L   TCO2 26 0 - 100 mmol/L   Hemoglobin 18.7 (H) 13.0 - 17.0 g/dL   HCT 55.0 (H) 39.0 - 52.0 %   Ct Head Wo Contrast  03/16/2016  CLINICAL DATA:  Headache with nausea and vomiting EXAM: CT HEAD WITHOUT CONTRAST TECHNIQUE: Contiguous axial images were obtained from the base of the skull through the vertex without intravenous contrast. COMPARISON:  July 10, 2012 FINDINGS: The ventricles are normal in size and configuration. There is no intracranial mass, hemorrhage, extra-axial fluid collection, or midline shift. Gray-white compartments appear normal. No acute infarct evident. Bony calvarium appears intact. Mastoid air cells are clear. Orbits appear symmetric bilaterally. There is opacification in anterior right  ethmoid air cell. IMPRESSION: Mild right ethmoid sinus disease. No intracranial mass hemorrhage, or focal gray - white compartment lesions/ acute appearing infarct. Electronically Signed   By: Lowella Grip III M.D.   On: 03/16/2016 10:31   Mr Jodene Nam Head Wo Contrast  03/16/2016  CLINICAL DATA:  Recent onset of neck pain and bilateral arm numbness. Acute onset of headache and inability to stand. EXAM: MR HEAD WITHOUT CONTRAST MR CIRCLE OF WILLIS WITHOUT CONTRAST MRA OF THE NECK WITHOUT  AND WITH CONTRAST MRI HEAD WITH CONTRAST AND MRA HEAD WITH CONTRAST AND MRI intracranial MR venography TECHNIQUE: Multiplanar, multiecho pulse sequences of the brain and surrounding structures were obtained according to standard protocol without intravenous contrast.; Angiographic images of the Circle of Willis were obtained using MRA technique without intravenous contrast.; intracranial MR venography was performed. : COMPARISON: Head CT same day FINDINGS: MR HEAD FINDINGS There is acute infarction affecting the left side of the medulla. There are a few punctate infarctions within the left cerebellum. Findings are consistent with left PICA distribution infarction. No acute infarction affecting the cerebral hemispheres. The brain otherwise has a normal appearance. No evidence of old infarction. No mass lesion, hemorrhage, hydrocephalus or extra-axial collection. No flow demonstrated in the left vertebral artery. No pituitary mass. No inflammatory sinus disease. MR CIRCLE OF WILLIS FINDINGS Both internal carotid arteries are widely patent into the brain. The anterior and middle cerebral vessels are patent without proximal stenosis, aneurysm or vascular malformation. There is motion degradation. The right vertebral artery is patent to the basilar. The left vertebral artery is occluded. Flow is present in right PICA. No basilar stenosis. Superior cerebellar arteries and posterior cerebral arteries are patent. MR venography Superior  sagittal sinus is patent. Deep veins are patent. Transverse sinuses are patent. Both jugular vein show flow. IMPRESSION: Left vertebral artery occlusion. Acute left PICA distribution infarction affecting the left lateral medulla and areas of the left cerebellum. No swelling or hemorrhage. The remainder the brain is normal. Normal MR venography. Electronically Signed   By: Nelson Chimes M.D.   On: 03/16/2016 15:53   Mr Brain Wo Contrast  03/16/2016  CLINICAL DATA:  Recent onset of neck pain and bilateral arm numbness. Acute onset of headache and inability to stand. EXAM: MR HEAD WITHOUT CONTRAST MR CIRCLE OF WILLIS WITHOUT CONTRAST MRA OF THE NECK WITHOUT AND WITH CONTRAST MRI HEAD WITH CONTRAST AND MRA HEAD WITH CONTRAST AND MRI intracranial MR venography TECHNIQUE: Multiplanar, multiecho pulse sequences of the brain and surrounding structures were obtained according to standard protocol without intravenous contrast.; Angiographic images of the Circle of Willis were obtained using MRA technique without intravenous contrast.; intracranial MR venography was performed. : COMPARISON: Head CT same day FINDINGS: MR HEAD FINDINGS There is acute infarction affecting the left side of the medulla. There are a few punctate infarctions within the left cerebellum. Findings are consistent with left PICA distribution infarction. No acute infarction affecting the cerebral hemispheres. The brain otherwise has a normal appearance. No evidence of old infarction. No mass lesion, hemorrhage, hydrocephalus or extra-axial collection. No flow demonstrated in the left vertebral artery. No pituitary mass. No inflammatory sinus disease. MR CIRCLE OF WILLIS FINDINGS Both internal carotid arteries are widely patent into the brain. The anterior and middle cerebral vessels are patent without proximal stenosis, aneurysm or vascular malformation. There is motion degradation. The right vertebral artery is patent to the basilar. The left vertebral  artery is occluded. Flow is present in right PICA. No basilar stenosis. Superior cerebellar arteries and posterior cerebral arteries are patent. MR venography Superior sagittal sinus is patent. Deep veins are patent. Transverse sinuses are patent. Both jugular vein show flow. IMPRESSION: Left vertebral artery occlusion. Acute left PICA distribution infarction affecting the left lateral medulla and areas of the left cerebellum. No swelling or hemorrhage. The remainder the brain is normal. Normal MR venography. Electronically Signed   By: Nelson Chimes M.D.   On: 03/16/2016 15:53   Mr Mrv Head Wo Cm  03/16/2016  CLINICAL DATA:  Recent onset of neck pain and bilateral arm numbness. Acute onset of headache and inability to stand. EXAM: MR HEAD WITHOUT CONTRAST MR CIRCLE OF WILLIS WITHOUT CONTRAST MRA OF THE NECK WITHOUT AND WITH CONTRAST MRI HEAD WITH CONTRAST AND MRA HEAD WITH CONTRAST AND MRI intracranial MR venography TECHNIQUE: Multiplanar, multiecho pulse sequences of the brain and surrounding structures were obtained according to standard protocol without intravenous contrast.; Angiographic images of the Circle of Willis were obtained using MRA technique without intravenous contrast.; intracranial MR venography was performed. : COMPARISON: Head CT same day FINDINGS: MR HEAD FINDINGS There is acute infarction affecting the left side of the medulla. There are a few punctate infarctions within the left cerebellum. Findings are consistent with left PICA distribution infarction. No acute infarction affecting the cerebral hemispheres. The brain otherwise has a normal appearance. No evidence of old infarction. No mass lesion, hemorrhage, hydrocephalus or extra-axial collection. No flow demonstrated in the left vertebral artery. No pituitary mass. No inflammatory sinus disease. MR CIRCLE OF WILLIS FINDINGS Both internal carotid arteries are widely patent into the brain. The anterior and middle cerebral vessels are  patent without proximal stenosis, aneurysm or vascular malformation. There is motion degradation. The right vertebral artery is patent to the basilar. The left vertebral artery is occluded. Flow is present in right PICA. No basilar stenosis. Superior cerebellar arteries and posterior cerebral arteries are patent. MR venography Superior sagittal sinus is patent. Deep veins are patent. Transverse sinuses are patent. Both jugular vein show flow. IMPRESSION: Left vertebral artery occlusion. Acute left PICA distribution infarction affecting the left lateral medulla and areas of the left cerebellum. No swelling or hemorrhage. The remainder the brain is normal. Normal MR venography. Electronically Signed   By: Nelson Chimes M.D.   On: 03/16/2016 15:53    Assessment: 60 y.o. male with multiple risk factors for stroke presenting with acute PICA distribution ischemic infarctions involving left cerebellum and lateral medulla. MRA is also demonstrated occlusion of left vertebral artery.  Stroke Risk Factors - hyperlipidemia and hypertension  Plan: 1. HgbA1c, fasting lipid panel 2. PT consult, OT consult, Speech consult 3. Echocardiogram 4. Carotid dopplers 5. Prophylactic therapy-Antiplatelet med: Aspirin  6. Risk factor modification 7. Telemetry monitoring  C.R. Nicole Kindred, MD [facility] [phone]  03/16/2016, 8:40 PM

## 2016-03-16 NOTE — ED Provider Notes (Signed)
CSN: [id number]     Arrival date & time 03/16/16  [id number] History   First MD Initiated Contact with Patient 03/16/16 0932     Chief Complaint  Patient presents with  . Nausea  . Emesis  . Headache     (Consider location/radiation/quality/duration/timing/severity/associated sxs/prior Treatment) HPI Comments: Patient is a 60 year old male with a history of hypertension, tobacco abuse, MI, coronary artery disease, cardiomyopathy presenting today with a headache that started abruptly yesterday on the left side of his head and dizziness and difficulty walking.  Patient states he never gets headaches and this is the first headache he's had in years. The pain is an 8 out of 10 and does not radiate. Patient states he has severe neck and back issues that's going to require surgery in the next 2 months so his doctor started him on hydrocodone for pain. He took 1 pill on Wednesday and then yesterday took a pill around 5 PM which is when the headache started. The headache has not improved and he has been unable to walk since the headache started. He feels dizzy which he describes as a moving sensation which is worse when he stands or attempts to walk. Even with sitting up he falls to his left side. No visual changes, chest pain, abdominal pain or new unilateral weakness or numbness. Patient has had nausea and vomiting associated with the headache and dizziness. He denies shortness of breath or cough. Also has not taken his blood pressure medication this morning.   Patient is a 60 y.o. male presenting with vomiting and headaches. The history is provided by the patient.  Emesis Associated symptoms: headaches   Headache Associated symptoms: vomiting     Past Medical History  Diagnosis Date  . Mixed hyperlipidemia   . GERD (gastroesophageal reflux disease)   . Hypertension     under control with med., has been on med. > 12 yr.  . 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 Sleep apnea      uses CPAP nightly  . Dental bridge present     lower  . Ischemic cardiomyopathy     a. Echo 09/2012:  Mod LVH, focal basal hypertrophy, EF 30-35%, mid to dist inf-lat HK, Gr 1 diast dysfn, mild to mod LAE  . CAD (coronary artery disease)     a. s/p prior MI; hx of stent to RCA and LAD;  b. LHC 12/2006:  LM 20%, pLAD 40%, mLAD stent ok, apical LAD 90% (1.5-28mm vessel), oD1 40-50%, AV groove CFX 30%, oOM1 30%, pRCA 30% (multiple), mRCA stent ok with 30-40% ISR, dRCA 30%, dAnt, inf-apical severe HK, EF 40% => med Rx.;  12/2013 Cath: LM nl, LAD 95ost, 29m ISR, 99apex, LCX nl, RCA 40p, 100 ISR, L->R collats, EF 35-40%-->pending CABG  . Hx of cardiovascular stress test     a.  Myoview (02/2010): EF 41%, septal, apical and inf infarct, no ischemia;   b.  Lexiscan Myoview (12/02/13): Large fixed perfusion defect in the RCA and LAD territory, no ischemia, EF 40%.  . Tobacco abuse   . Myocardial infarction ([place])     x2  . Arthritis    Past Surgical History  Procedure Laterality Date  . Knee arthroscopy      left knee x 1, right knee x 2  . Coronary angioplasty with stent placement  05/22/2001    PCI - RCA - LAD  . Coronary angioplasty with stent placement  11/11/2006    PCI -  BMS - LAD  . Coronary angioplasty with stent placement  11/15/2006    PCI - BMS - RCA  . Coronary angioplasty with stent placement  03/16/2010  . Cardiac catheterization  06/27/2001; 12/13/2006; 02/20/2010  . Open reduction internal fixation (orif) scaphoid with iliac crest bone graft  10/15/2012    Procedure: OPEN REDUCTION INTERNAL FIXATION (ORIF) SCAPHOID WITH ILIAC CREST BONE GRAFT;  Surgeon: Schuyler Amor, MD;  Location: [place];  Service: Orthopedics;  Laterality: Right;  No iliac creast bone graft taken, used graft from radius  . Hand surgery Right     screws  . Foot surgery Right     achilles tendon reattached  . Coronary artery bypass graft N/A 01/01/2014    Procedure: Coronary artery bypass graft  times two using left internal mammary artery and left leg greater saphenous vein harvested endoscopically.;  Surgeon: Gaye Pollack, MD;  Location: [facility] OR;  Service: Open Heart Surgery;  Laterality: N/A;  . Intraoperative transesophageal echocardiogram N/A 01/01/2014    Procedure: INTRAOPERATIVE TRANSESOPHAGEAL ECHOCARDIOGRAM;  Surgeon: Gaye Pollack, MD;  Location[facility] OR;  Service: Open Heart Surgery;  Laterality: N/A;  . Left heart catheterization with coronary angiogram N/A 12/21/2013    Procedure: LEFT HEART CATHETERIZATION WITH CORONARY ANGIOGRAM;  Surgeon: Burnell Blanks, MD;  Location: [facility] CATH LAB;  Service: Cardiovascular;  Laterality: N/A;   Family History  Problem Relation Age of Onset  . Heart disease Father   . Heart disease Maternal Grandmother   . Heart disease Maternal Grandfather   . Heart disease Paternal Grandmother   . Heart disease Paternal Grandfather    Social History  Substance Use Topics  . Smoking status: Former Smoker -- 1.00 packs/day for 20 years    Types: Cigarettes  . Smokeless tobacco: Never Used  . Alcohol Use: Yes     Comment: occasional    Review of Systems  Gastrointestinal: Positive for vomiting.  Neurological: Positive for headaches.  All other systems reviewed and are negative.     Allergies  Hydrocodone  Home Medications   Prior to Admission medications   Medication Sig Start Date End Date Taking? Authorizing Provider  aspirin 325 MG tablet Take 1 tablet (325 mg total) by mouth daily. 09/19/12  Yes Josue Hector, MD  atorvastatin (LIPITOR) 20 MG tablet TAKE 1 TABLET (20 MG TOTAL) BY MOUTH DAILY. 01/30/16  Yes Josue Hector, MD  carvedilol (COREG) 3.125 MG tablet Take 1 tablet (3.125 mg total) by mouth 2 (two) times daily with a meal. 02/21/16  Yes Josue Hector, MD  HYDROcodone-acetaminophen (NORCO/VICODIN) 5-325 MG tablet Take 1 tablet by mouth every 6 (six) hours as needed for moderate pain.   Yes Historical Provider, MD   losartan (COZAAR) 50 MG tablet Take 1 tablet (50 mg total) by mouth daily. 02/15/16  Yes Josue Hector, MD  Multiple Vitamins-Minerals (MULTIVITAMIN WITH MINERALS) tablet Take 1 tablet by mouth daily.   Yes Historical Provider, MD  triamcinolone ointment (KENALOG) 0.5 % Apply 1 application topically 2 (two) times daily. 02/07/16  Yes Historical Provider, MD   BP 194/104 mmHg  Pulse 55  Temp(Src) 97.7 F (36.5 C) (Oral)  Resp 18  SpO2 99% Physical Exam  Constitutional: He is oriented to person, place, and time. He appears well-developed and well-nourished. No distress.  HENT:  Head: Normocephalic and atraumatic.  Mouth/Throat: Oropharynx is clear and moist.  Eyes: Conjunctivae and EOM are normal. Pupils are equal, round, and reactive  to light.  Neck: Normal range of motion. Neck supple.  Cardiovascular: Normal rate, regular rhythm and intact distal pulses.   No murmur heard. Pulmonary/Chest: Effort normal and breath sounds normal. No respiratory distress. He has no wheezes. He has no rales.  Abdominal: Soft. He exhibits no distension. There is no tenderness. There is no rebound and no guarding.  Musculoskeletal: Normal range of motion. He exhibits no edema or tenderness.  Neurological: He is alert and oriented to person, place, and time. He has normal strength. No cranial nerve deficit or sensory deficit.  Pain with raising bilateral lower extremities but that is chronic in nature and strength appears to be intact. Mild difficulty with finger to nose testing with some mild past pointing. Inability to stand or walk due to ataxia. Normal speech. No visual field cuts  Skin: Skin is warm and dry. No rash noted. No erythema.  Psychiatric: He has a normal mood and affect. His behavior is normal.  Nursing note and vitals reviewed.   ED Course  Procedures (including critical care time) Labs Review Labs Reviewed  CBC WITH DIFFERENTIAL/PLATELET - Abnormal; Notable for the following:     Hemoglobin 17.1 (*)    Neutro Abs 8.4 (*)    All other components within normal limits  I-STAT CHEM 8, ED - Abnormal; Notable for the following:    Chloride 100 (*)    Glucose, Bld 111 (*)    Calcium, Ion 1.09 (*)    Hemoglobin 18.7 (*)    HCT 55.0 (*)    All other components within normal limits  I-STAT TROPOININ, ED    Imaging Review Ct Head Wo Contrast  03/16/2016  CLINICAL DATA:  Headache with nausea and vomiting EXAM: CT HEAD WITHOUT CONTRAST TECHNIQUE: Contiguous axial images were obtained from the base of the skull through the vertex without intravenous contrast. COMPARISON:  July 10, 2012 FINDINGS: The ventricles are normal in size and configuration. There is no intracranial mass, hemorrhage, extra-axial fluid collection, or midline shift. Gray-white compartments appear normal. No acute infarct evident. Bony calvarium appears intact. Mastoid air cells are clear. Orbits appear symmetric bilaterally. There is opacification in anterior right ethmoid air cell. IMPRESSION: Mild right ethmoid sinus disease. No intracranial mass hemorrhage, or focal gray - white compartment lesions/ acute appearing infarct. Electronically Signed   By: Lowella Grip III M.D.   On: 03/16/2016 10:31   I have personally reviewed and evaluated these images and lab results as part of my medical decision-making.   EKG Interpretation   Date/Time:  Friday Mar 16 2016 09:14:02 EDT Ventricular Rate:  53 PR Interval:  168 QRS Duration: 110 QT Interval:  478 QTC Calculation: 449 R Axis:   18 Text Interpretation:  Sinus rhythm Probable anterior infarct, age  indeterminate No significant change since last tracing Confirmed by  Maryan Rued  MD, Loree Fee ([id number]) on 03/16/2016 10:10:55 AM      MDM   Final diagnoses:  Ataxia  Cerebellar stroke [facility]    Patient is a 60 year old male presenting with severe headache and ataxia and dizziness that started around 5 PM last night. Patient is having difficulty  walking here as well as some difficulty with finger to nose testing. Patient is hypertensive today at 194/100 however he has not taken his blood pressure medications. He does not have a history of chronic headaches but does have a history of chronic pain with neck and back disc disease. He has surgery scheduled in the next 2 months but states that  that is a chronic problem and has not changed. The dizziness is affected by positioning and will also cause him to feel nauseated and vomit. He denies any chest or abdominal symptoms at this time.  Concern for spontaneous intracranial bleed versus stroke versus reaction to hydrocodone which he just recently took versus peripheral vertigo. Patient is hypertensive here and he took his home meds. He was given a headache cocktail with complete resolution of his headache but persistent dizziness with sitting up. He was then treated with meclizine. CT negative for acute findings but will do MRI to rule out stroke.  Labs and EKG without acute findings.  Discussed with neuro who also recommended MRV to r/o venous thrombosis.  No improvement in dizziness or ataxia or meclizine. Patient given Valium.  4:19 PM Ct consistent with stroke.  Neuro already aware. Will admit patient to hospitalist service.  Blanchie Dessert, MD 03/16/16 [phone]

## 2016-03-16 NOTE — ED Notes (Signed)
Attempted to call report

## 2016-03-16 NOTE — ED Notes (Signed)
Pt is back from MRI.

## 2016-03-17 ENCOUNTER — Inpatient Hospital Stay (HOSPITAL_COMMUNITY): Payer: BLUE CROSS/BLUE SHIELD

## 2016-03-17 ENCOUNTER — Encounter (HOSPITAL_COMMUNITY): Payer: Self-pay | Admitting: Radiology

## 2016-03-17 DIAGNOSIS — Z79899 Other long term (current) drug therapy: Secondary | ICD-10-CM | POA: Diagnosis not present

## 2016-03-17 DIAGNOSIS — Z720 Tobacco use: Secondary | ICD-10-CM | POA: Diagnosis not present

## 2016-03-17 DIAGNOSIS — I6502 Occlusion and stenosis of left vertebral artery: Secondary | ICD-10-CM | POA: Diagnosis present

## 2016-03-17 DIAGNOSIS — I255 Ischemic cardiomyopathy: Secondary | ICD-10-CM | POA: Diagnosis present

## 2016-03-17 DIAGNOSIS — G902 Horner's syndrome: Secondary | ICD-10-CM | POA: Diagnosis present

## 2016-03-17 DIAGNOSIS — G4733 Obstructive sleep apnea (adult) (pediatric): Secondary | ICD-10-CM | POA: Diagnosis present

## 2016-03-17 DIAGNOSIS — R1310 Dysphagia, unspecified: Secondary | ICD-10-CM | POA: Diagnosis present

## 2016-03-17 DIAGNOSIS — Z885 Allergy status to narcotic agent status: Secondary | ICD-10-CM | POA: Diagnosis not present

## 2016-03-17 DIAGNOSIS — K219 Gastro-esophageal reflux disease without esophagitis: Secondary | ICD-10-CM | POA: Diagnosis present

## 2016-03-17 DIAGNOSIS — Z7982 Long term (current) use of aspirin: Secondary | ICD-10-CM | POA: Diagnosis not present

## 2016-03-17 DIAGNOSIS — R42 Dizziness and giddiness: Secondary | ICD-10-CM | POA: Diagnosis present

## 2016-03-17 DIAGNOSIS — I10 Essential (primary) hypertension: Secondary | ICD-10-CM | POA: Diagnosis present

## 2016-03-17 DIAGNOSIS — M1990 Unspecified osteoarthritis, unspecified site: Secondary | ICD-10-CM | POA: Diagnosis present

## 2016-03-17 DIAGNOSIS — Z951 Presence of aortocoronary bypass graft: Secondary | ICD-10-CM | POA: Diagnosis not present

## 2016-03-17 DIAGNOSIS — I252 Old myocardial infarction: Secondary | ICD-10-CM | POA: Diagnosis not present

## 2016-03-17 DIAGNOSIS — I6789 Other cerebrovascular disease: Secondary | ICD-10-CM | POA: Diagnosis not present

## 2016-03-17 DIAGNOSIS — I2510 Atherosclerotic heart disease of native coronary artery without angina pectoris: Secondary | ICD-10-CM | POA: Diagnosis present

## 2016-03-17 DIAGNOSIS — F17210 Nicotine dependence, cigarettes, uncomplicated: Secondary | ICD-10-CM | POA: Diagnosis present

## 2016-03-17 DIAGNOSIS — E782 Mixed hyperlipidemia: Secondary | ICD-10-CM | POA: Diagnosis present

## 2016-03-17 DIAGNOSIS — Z9889 Other specified postprocedural states: Secondary | ICD-10-CM | POA: Diagnosis not present

## 2016-03-17 DIAGNOSIS — Z955 Presence of coronary angioplasty implant and graft: Secondary | ICD-10-CM | POA: Diagnosis not present

## 2016-03-17 DIAGNOSIS — F17200 Nicotine dependence, unspecified, uncomplicated: Secondary | ICD-10-CM | POA: Diagnosis not present

## 2016-03-17 DIAGNOSIS — R29810 Facial weakness: Secondary | ICD-10-CM | POA: Diagnosis present

## 2016-03-17 DIAGNOSIS — R270 Ataxia, unspecified: Secondary | ICD-10-CM | POA: Insufficient documentation

## 2016-03-17 DIAGNOSIS — I639 Cerebral infarction, unspecified: Secondary | ICD-10-CM | POA: Diagnosis present

## 2016-03-17 LAB — LIPID PANEL
Cholesterol: 181 mg/dL (ref 0–200)
HDL: 47 mg/dL (ref >40)
LDL Cholesterol: 118 mg/dL — ABNORMAL HIGH (ref 0–99)
Total CHOL/HDL Ratio: 3.9 RATIO
Triglycerides: 80 mg/dL (ref <150)
VLDL: 16 mg/dL (ref 0–40)

## 2016-03-17 MED ORDER — MENTHOL 3 MG MT LOZG
1.0000 | LOZENGE | OROMUCOSAL | Status: DC | PRN
  Administered 2016-03-17: 3 mg via ORAL
  Filled 2016-03-17: qty 9

## 2016-03-17 MED ORDER — ATORVASTATIN CALCIUM 40 MG PO TABS
40.0000 mg | ORAL_TABLET | Freq: Every day | ORAL | Status: DC
  Administered 2016-03-17 – 2016-03-18 (×2): 40 mg via ORAL
  Filled 2016-03-17 (×2): qty 1

## 2016-03-17 MED ORDER — IOPAMIDOL (ISOVUE-370) INJECTION 76%
50.0000 mL | Freq: Once | INTRAVENOUS | Status: AC | PRN
  Administered 2016-03-17: 50 mL via INTRAVENOUS

## 2016-03-17 MED ORDER — MECLIZINE HCL 25 MG PO TABS
50.0000 mg | ORAL_TABLET | Freq: Two times a day (BID) | ORAL | Status: DC | PRN
  Administered 2016-03-17: 50 mg via ORAL
  Filled 2016-03-17 (×2): qty 2

## 2016-03-17 NOTE — Progress Notes (Signed)
PROGRESS NOTE                                                                                                                                                                                                             Patient Demographics:    [name], is a 60 y.o. male, DOB - 1956/03/25, [id number]  Admit date - 03/16/2016   Admitting Physician Velvet Bathe, MD  Outpatient Primary MD for the patient is Stephens Shire, MD  LOS - 1  Outpatient Specialists: Areta Haber  Chief Complaint  Patient presents with  . Nausea  . Emesis  . Headache       Brief Narrative     Subjective:    [name] Has no new complaints no acute issues overnight.    Assessment  & Plan :    Active Problems:     Acute CVA (cerebrovascular accident) ([place])  Cerebellar stroke ([place]) - Work up per Neurology - Awaiting further evaluation  Mixed hyperlipidemia -  Placed on lipitor    SMOKER - recommended cessation  Hiccups - recommended peanut butter    Essential hypertension, benign - allowing for permissive hypertension  Code Status : full  Family Communication  : d/c patient and family at bedside  Disposition Plan  : pending work up  Barriers For Discharge : Neurology work up  Consults  :  Neurologist  Procedures  : None, please refer to work up plans by neurologist  DVT Prophylaxis  :   Heparin  Lab Results  Component Value Date   PLT 171 03/16/2016    Antibiotics  :  None  Anti-infectives    None        Objective:   Filed Vitals:   03/16/16 2129 03/16/16 2226 03/17/16 0017 03/17/16 0500  BP: 169/104  152/98 168/100  Pulse: 100  108 78  Temp: 97.7 F (36.5 C)   97.9 F (36.6 C)  TempSrc: Oral   Oral  Resp: 16   19  Height:  5\' 10"  (1.778 m)    Weight:  63.1 kg (139 lb 1.8 oz)    SpO2: 99%       Wt Readings from Last 3 Encounters:  03/16/16 63.1 kg (139 lb 1.8 oz)  01/16/16  68.493 kg (151 lb)  12/08/15 67.314 kg (148 lb 6.4 oz)  Intake/Output Summary (Last 24 hours) at 03/17/16 1106 Last data filed at 03/17/16 1015  Gross per 24 hour  Intake    120 ml  Output      0 ml  Net    120 ml    Physical Exam  Awake Alert, Oriented X 3,  Normal affect Supple Neck, No JVD, No cervical lymphadenopathy appreciated.  Symmetrical Chest wall movement, Good air movement bilaterally, CTAB RRR,No Gallops,Rubs or new Murmurs, No Parasternal Heave +ve B.Sounds, Abd Soft, No tenderness, No organomegaly appriciated, No rebound - guarding or rigidity. No Cyanosis, Clubbing or edema, No new Rash or bruise Neuro: dysmetria with finger to nose, answers questions appropriately    Data Review:    CBC  Recent Labs Lab 03/16/16 0958 03/16/16 1012  WBC 10.0  --   HGB 17.1* 18.7*  HCT 50.2 55.0*  PLT 171  --   MCV 93.3  --   MCH 31.8  --   MCHC 34.1  --   RDW 12.4  --   LYMPHSABS 1.1  --   MONOABS 0.5  --   EOSABS 0.0  --   BASOSABS 0.0  --     Chemistries   Recent Labs Lab 03/16/16 1012  NA 140  K 4.0  CL 100*  GLUCOSE 111*  BUN 10  CREATININE 0.70   ------------------------------------------------------------------------------------------------------------------  Recent Labs  03/17/16 0746  CHOL 181  HDL 47  LDLCALC 118*  TRIG 80  CHOLHDL 3.9    Lab Results  Component Value Date   HGBA1C 5.3 12/30/2013   ------------------------------------------------------------------------------------------------------------------ No results for input(s): TSH, T4TOTAL, T3FREE, THYROIDAB in the last 72 hours.  Invalid input(s): FREET3 ------------------------------------------------------------------------------------------------------------------ No results for input(s): VITAMINB12, FOLATE, FERRITIN, TIBC, IRON, RETICCTPCT in the last 72 hours.  Coagulation profile No results for input(s): INR, PROTIME in the last 168 hours.  No results for  input(s): DDIMER in the last 72 hours.  Cardiac Enzymes No results for input(s): CKMB, TROPONINI, MYOGLOBIN in the last 168 hours.  Invalid input(s): CK ------------------------------------------------------------------------------------------------------------------ No results found for: BNP  Inpatient Medications  Scheduled Meds: . antiseptic oral rinse  7 mL Mouth Rinse BID  . aspirin  300 mg Rectal Daily   Or  . aspirin  325 mg Oral Daily  . atorvastatin  20 mg Oral q1800  . heparin  5,000 Units Subcutaneous Q8H  . multivitamin with minerals  1 tablet Oral Daily   Continuous Infusions:  PRN Meds:.senna-docusate  Micro Results No results found for this or any previous visit (from the past 240 hour(s)).  Radiology Reports Ct Head Wo Contrast  03/16/2016  CLINICAL DATA:  Headache with nausea and vomiting EXAM: CT HEAD WITHOUT CONTRAST TECHNIQUE: Contiguous axial images were obtained from the base of the skull through the vertex without intravenous contrast. COMPARISON:  July 10, 2012 FINDINGS: The ventricles are normal in size and configuration. There is no intracranial mass, hemorrhage, extra-axial fluid collection, or midline shift. Gray-white compartments appear normal. No acute infarct evident. Bony calvarium appears intact. Mastoid air cells are clear. Orbits appear symmetric bilaterally. There is opacification in anterior right ethmoid air cell. IMPRESSION: Mild right ethmoid sinus disease. No intracranial mass hemorrhage, or focal gray - white compartment lesions/ acute appearing infarct. Electronically Signed   By: Lowella Grip III M.D.   On: 03/16/2016 10:31   Mr Jodene Nam Head Wo Contrast  03/16/2016  CLINICAL DATA:  Recent onset of neck pain and bilateral arm numbness. Acute onset of headache and inability  to stand. EXAM: MR HEAD WITHOUT CONTRAST MR CIRCLE OF WILLIS WITHOUT CONTRAST MRA OF THE NECK WITHOUT AND WITH CONTRAST MRI HEAD WITH CONTRAST AND MRA HEAD WITH  CONTRAST AND MRI intracranial MR venography TECHNIQUE: Multiplanar, multiecho pulse sequences of the brain and surrounding structures were obtained according to standard protocol without intravenous contrast.; Angiographic images of the Circle of Willis were obtained using MRA technique without intravenous contrast.; intracranial MR venography was performed. : COMPARISON: Head CT same day FINDINGS: MR HEAD FINDINGS There is acute infarction affecting the left side of the medulla. There are a few punctate infarctions within the left cerebellum. Findings are consistent with left PICA distribution infarction. No acute infarction affecting the cerebral hemispheres. The brain otherwise has a normal appearance. No evidence of old infarction. No mass lesion, hemorrhage, hydrocephalus or extra-axial collection. No flow demonstrated in the left vertebral artery. No pituitary mass. No inflammatory sinus disease. MR CIRCLE OF WILLIS FINDINGS Both internal carotid arteries are widely patent into the brain. The anterior and middle cerebral vessels are patent without proximal stenosis, aneurysm or vascular malformation. There is motion degradation. The right vertebral artery is patent to the basilar. The left vertebral artery is occluded. Flow is present in right PICA. No basilar stenosis. Superior cerebellar arteries and posterior cerebral arteries are patent. MR venography Superior sagittal sinus is patent. Deep veins are patent. Transverse sinuses are patent. Both jugular vein show flow. IMPRESSION: Left vertebral artery occlusion. Acute left PICA distribution infarction affecting the left lateral medulla and areas of the left cerebellum. No swelling or hemorrhage. The remainder the brain is normal. Normal MR venography. Electronically Signed   By: Nelson Chimes M.D.   On: 03/16/2016 15:53   Mr Brain Wo Contrast  03/16/2016  CLINICAL DATA:  Recent onset of neck pain and bilateral arm numbness. Acute onset of headache and  inability to stand. EXAM: MR HEAD WITHOUT CONTRAST MR CIRCLE OF WILLIS WITHOUT CONTRAST MRA OF THE NECK WITHOUT AND WITH CONTRAST MRI HEAD WITH CONTRAST AND MRA HEAD WITH CONTRAST AND MRI intracranial MR venography TECHNIQUE: Multiplanar, multiecho pulse sequences of the brain and surrounding structures were obtained according to standard protocol without intravenous contrast.; Angiographic images of the Circle of Willis were obtained using MRA technique without intravenous contrast.; intracranial MR venography was performed. : COMPARISON: Head CT same day FINDINGS: MR HEAD FINDINGS There is acute infarction affecting the left side of the medulla. There are a few punctate infarctions within the left cerebellum. Findings are consistent with left PICA distribution infarction. No acute infarction affecting the cerebral hemispheres. The brain otherwise has a normal appearance. No evidence of old infarction. No mass lesion, hemorrhage, hydrocephalus or extra-axial collection. No flow demonstrated in the left vertebral artery. No pituitary mass. No inflammatory sinus disease. MR CIRCLE OF WILLIS FINDINGS Both internal carotid arteries are widely patent into the brain. The anterior and middle cerebral vessels are patent without proximal stenosis, aneurysm or vascular malformation. There is motion degradation. The right vertebral artery is patent to the basilar. The left vertebral artery is occluded. Flow is present in right PICA. No basilar stenosis. Superior cerebellar arteries and posterior cerebral arteries are patent. MR venography Superior sagittal sinus is patent. Deep veins are patent. Transverse sinuses are patent. Both jugular vein show flow. IMPRESSION: Left vertebral artery occlusion. Acute left PICA distribution infarction affecting the left lateral medulla and areas of the left cerebellum. No swelling or hemorrhage. The remainder the brain is normal. Normal MR venography. Electronically Signed  By: Nelson Chimes M.D.   On: 03/16/2016 15:53   Mr Mrv Head Wo Cm  03/16/2016  CLINICAL DATA:  Recent onset of neck pain and bilateral arm numbness. Acute onset of headache and inability to stand. EXAM: MR HEAD WITHOUT CONTRAST MR CIRCLE OF WILLIS WITHOUT CONTRAST MRA OF THE NECK WITHOUT AND WITH CONTRAST MRI HEAD WITH CONTRAST AND MRA HEAD WITH CONTRAST AND MRI intracranial MR venography TECHNIQUE: Multiplanar, multiecho pulse sequences of the brain and surrounding structures were obtained according to standard protocol without intravenous contrast.; Angiographic images of the Circle of Willis were obtained using MRA technique without intravenous contrast.; intracranial MR venography was performed. : COMPARISON: Head CT same day FINDINGS: MR HEAD FINDINGS There is acute infarction affecting the left side of the medulla. There are a few punctate infarctions within the left cerebellum. Findings are consistent with left PICA distribution infarction. No acute infarction affecting the cerebral hemispheres. The brain otherwise has a normal appearance. No evidence of old infarction. No mass lesion, hemorrhage, hydrocephalus or extra-axial collection. No flow demonstrated in the left vertebral artery. No pituitary mass. No inflammatory sinus disease. MR CIRCLE OF WILLIS FINDINGS Both internal carotid arteries are widely patent into the brain. The anterior and middle cerebral vessels are patent without proximal stenosis, aneurysm or vascular malformation. There is motion degradation. The right vertebral artery is patent to the basilar. The left vertebral artery is occluded. Flow is present in right PICA. No basilar stenosis. Superior cerebellar arteries and posterior cerebral arteries are patent. MR venography Superior sagittal sinus is patent. Deep veins are patent. Transverse sinuses are patent. Both jugular vein show flow. IMPRESSION: Left vertebral artery occlusion. Acute left PICA distribution infarction affecting the left  lateral medulla and areas of the left cerebellum. No swelling or hemorrhage. The remainder the brain is normal. Normal MR venography. Electronically Signed   By: Nelson Chimes M.D.   On: 03/16/2016 15:53    Time Spent in minutes  25   Velvet Bathe M.D on 03/17/2016 at 11:06 AM  Between 7am to 7pm - Pager - [phone]  After 7pm go to [email or web address] - password [facility]  [facility] -  Office  [phone]

## 2016-03-17 NOTE — Evaluation (Signed)
Occupational Therapy Evaluation Patient Details Name: [name] MRN: [id number] DOB: [birth year] Today's Date: 03/17/2016    History of Present Illness [name] is an 60 y.o. male history of hypertension, hyperlipidemia, coronary artery disease and arthritis, presenting with new onset vertigo and slurred speech as well as unstable gait. He is also had noticeable difficulty with swallowing. Onset of symptoms was at 5 PM on 03/15/2016. He has no previous history of stroke nor TIA. He's been taking aspirin daily. MRI of the brain showed acute left PICA distribution infarcts involving cerebellum and left lateral medulla. 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 His wife is noted slight droop of the face on the left vision is noted mild sensory changes involving the left side of his face and left hand. NIH stroke score was 3.Pt. PMH: HTN, scaphoid ORIF R, cardiomyopathy EF30-35%, CAD, MIx2, stents, OA, R and L knee arthroscopy, R hand screws, R achelles tenden repain, CABG times 2.    Clinical Impression   Pt. Has decreased sitting balance and is leaning to L and requires verbal and tactile cues to go to midline. Pt. Is unable to maintain midline and returns to L lean. Pt. Is Min A to maintain balance in standing and leaning posterior. Pt. Is Min A with transfers to A with balance and for safety. Pt. Would benefit from rehab to increase ability and safety with ADLs and mobility.     Follow Up Recommendations  CIR;SNF    Equipment Recommendations  3 in 1 bedside comode;Tub/shower seat    Recommendations for Other Services       Precautions / Restrictions Precautions Precautions: Fall Restrictions Weight Bearing Restrictions: No      Mobility Bed Mobility Overal bed mobility: Needs Assistance Bed Mobility: Supine to Sit     Supine to sit: Min guard     General bed mobility comments: Pt leans to L while sitting   Transfers Overall transfer level: Needs assistance    Transfers: Stand Pivot Transfers   Stand pivot transfers: Min assist       General transfer comment: Pt. is leaning posterior during standing.     Balance                                            ADL Overall ADL's : Needs assistance/impaired Eating/Feeding: Independent   Grooming: Wash/dry hands;Wash/dry face;Brushing hair;Minimal assistance   Upper Body Bathing: Minimal assitance   Lower Body Bathing: Moderate assistance;Sit to/from stand   Upper Body Dressing : Minimal assistance   Lower Body Dressing: Moderate assistance   Toilet Transfer: Minimal assistance           Functional mobility during ADLs: Minimal assistance General ADL Comments: Pt. is leaning to L while sitting EOB.     Vision     Perception     Praxis      Pertinent Vitals/Pain Pain Assessment: No/denies pain     Hand Dominance Right   Extremity/Trunk Assessment Upper Extremity Assessment Upper Extremity Assessment: RUE deficits/detail;LUE deficits/detail RUE Sensation: decreased light touch LUE Sensation: decreased light touch           Communication Communication Communication: No difficulties   Cognition Arousal/Alertness: Awake/alert Behavior During Therapy: WFL for tasks assessed/performed Overall Cognitive Status: Within Functional Limits for tasks assessed  General Comments       Exercises       Shoulder Instructions      Home Living Family/patient expects to be discharged to:: Private residence Living Arrangements: Spouse/significant other Available Help at Discharge: Available PRN/intermittently Type of Home: Mobile home Home Access: Stairs to enter Entrance Stairs-Number of Steps:  (3) Entrance Stairs-Rails: Can reach both       Bathroom Shower/Tub: Occupational psychologist: Standard Bathroom Accessibility: Yes How Accessible: Accessible via walker Home Equipment: None          Prior  Functioning/Environment Level of Independence: Independent             OT Diagnosis: Other (comment)   OT Problem List: Decreased activity tolerance;Impaired balance (sitting and/or standing)   OT Treatment/Interventions: Self-care/ADL training;Neuromuscular education;Therapeutic activities;Patient/family education    OT Goals(Current goals can be found in the care plan section) Acute Rehab OT Goals Patient Stated Goal: Pt. wants to d/c home from hospital. Pt. would benefit from rehab to increase safety and I with ADLs and mobility.  OT Goal Formulation: With patient Time For Goal Achievement: 03/31/16 Potential to Achieve Goals: Good ADL Goals Pt Will Perform Grooming: with modified independence;standing Pt Will Perform Upper Body Bathing: with modified independence;sitting Pt Will Perform Lower Body Bathing: with modified independence;sit to/from stand Pt Will Perform Upper Body Dressing: with modified independence;sitting Pt Will Perform Lower Body Dressing: with modified independence;sit to/from stand Pt Will Transfer to Toilet: with modified independence;ambulating;regular height toilet Pt Will Perform Toileting - Clothing Manipulation and hygiene: with modified independence;sit to/from stand  OT Frequency: Min 3X/week   Barriers to D/C:    Pt. is at a high fall risk.       Co-evaluation              End of Session    Activity Tolerance: Patient tolerated treatment well Patient left: in bed;with call bell/phone within reach;with bed alarm set;with family/visitor present   Time: [id number] OT Time Calculation (min): 55 min Charges:  OT General Charges $OT Visit: 1 Procedure OT Evaluation $OT Eval Moderate Complexity: 1 Procedure OT Treatments $Self Care/Home Management : 8-22 mins $Neuromuscular Re-education: 8-22 mins G-Codes: OT G-codes **NOT FOR INPATIENT CLASS** Functional Limitation: Self care Self Care Current Status [id number]): At least 20 percent but  less than 40 percent impaired, limited or restricted Self Care Goal Status [id number]): 0 percent impaired, limited or restricted  Annaleigha Woo 03/17/2016, 11:24 AM

## 2016-03-17 NOTE — Progress Notes (Signed)
STROKE TEAM PROGRESS NOTE   HISTORY OF PRESENT ILLNESS  [name] is an 60 y.o. male history of hypertension, hyperlipidemia, coronary artery disease and arthritis, presenting with new onset vertigo and slurred speech as well as unstable gait. He is also had noticeable difficulty with swallowing. Onset of symptoms was at 5 PM on 03/15/2016. He has no previous history of stroke nor TIA. He's been taking aspirin daily. MRI of the brain showed acute left PICA distribution infarcts involving cerebellum and left lateral medulla. 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 His wife is noted slight droop of the face on the left vision is noted mild sensory changes involving the left side of his face and left hand. NIH stroke score was 3.  LSN: O'clock p.m. on 03/15/2016 tPA Given: No: Beyond time window for treatment consideration mRankin:   SUBJECTIVE (INTERVAL HISTORY) Wife at bedside, feeling dizzy   OBJECTIVE Temp:  [97.7 F (36.5 C)-97.9 F (36.6 C)] 97.9 F (36.6 C) (05/13 0500) Pulse Rate:  [54-108] 78 (05/13 0500) Cardiac Rhythm:  [-] Normal sinus rhythm (05/12 2149) Resp:  [13-20] 19 (05/13 0500) BP: (152-208)/(89-140) 168/100 mmHg (05/13 0500) SpO2:  [96 %-100 %] 99 % (05/12 2129) Weight:  [63.1 kg (139 lb 1.8 oz)] 63.1 kg (139 lb 1.8 oz) (05/12 2226)  CBC:  Recent Labs Lab 03/16/16 0958 03/16/16 1012  WBC 10.0  --   NEUTROABS 8.4*  --   HGB 17.1* 18.7*  HCT 50.2 55.0*  MCV 93.3  --   PLT 171  --     Basic Metabolic Panel:  Recent Labs Lab 03/16/16 1012  NA 140  K 4.0  CL 100*  GLUCOSE 111*  BUN 10  CREATININE 0.70    Lipid Panel: No results found for: CHOL, TRIG, HDL, CHOLHDL, VLDL, LDLCALC HgbA1c:  Lab Results  Component Value Date   HGBA1C 5.3 12/30/2013   Urine Drug Screen:    Component Value Date/Time   LABOPIA NONE DETECTED 06/20/2010 1200   COCAINSCRNUR NONE DETECTED 06/20/2010 1200   LABBENZ NONE DETECTED 06/20/2010 1200   AMPHETMU NONE  DETECTED 06/20/2010 1200   THCU POSITIVE* 06/20/2010 1200   LABBARB  06/20/2010 1200    NONE DETECTED        DRUG SCREEN FOR MEDICAL PURPOSES ONLY.  IF CONFIRMATION IS NEEDED FOR ANY PURPOSE, NOTIFY LAB WITHIN 5 DAYS.        LOWEST DETECTABLE LIMITS FOR URINE DRUG SCREEN Drug Class       Cutoff (ng/mL) Amphetamine      1000 Barbiturate      200 Benzodiazepine   [id number] Tricyclics       [id number] Opiates          300 Cocaine          300 THC              50      IMAGING   Ct Head Wo Contrast 03/16/2016   Mild right ethmoid sinus disease. No intracranial mass hemorrhage, or focal gray - white compartment lesions/ acute appearing infarct.   Mr Jodene Nam Head Wo Contrast 03/16/2016   Left vertebral artery occlusion. Acute left PICA distribution infarction affecting the left lateral medulla and areas of the left cerebellum. No swelling or hemorrhage. The remainder the brain is normal. Normal MR venography.     Mr Mrv Head Wo Cm 03/16/2016   Left vertebral artery occlusion. Acute left PICA distribution infarction affecting the left lateral medulla and areas of  the left cerebellum. No swelling or hemorrhage. The remainder the brain is normal. Normal MR venography.      PHYSICAL EXAM  Physical exam: Exam: Gen: NAD, conversant, well nourised, obese, well groomed                     CV: RRR, no MRG. No Carotid Bruits. No peripheral edema, warm, nontender Eyes: Conjunctivae clear without exudates or hemorrhage  Neuro: Detailed Neurologic Exam  Speech:    Speech is normal; fluent and spontaneous with normal comprehension.  Cognition:    The patient is oriented to person, place, and time;  Cranial Nerves:    The pupils are equal, round, and reactive to light. The fundi are normal and spontaneous venous pulsations are present. Visual fields are full to finger confrontation. Extraocular movements are intact. Trigeminal sensation is intact and the muscles of mastication are normal. The face  is symmetric. The palate elevates in the midline. Hearing intact. Voice is normal. Shoulder shrug is normal. The tongue has normal motion without fasciculations.   Coordination:    Very mild dysmetria LUE  Motor Observation:    No asymmetry, no atrophy, and no involuntary movements noted. Tone:    Normal muscle tone.    Strength:    Strength is V/V in the upper and lower limbs.      Sensation: intact to LT     Reflex Exam:   ASSESSMENT/PLAN [name] is a 60 y.o. male with history of hyperlipidemia, hypertension, obstructive sleep apnea, ischemic cardiomyopathy, coronary artery disease with previous MI, and history of tobacco use presenting with new-onset vertigo, slurred speech, left facial droop, mild sensory changes of the left face and hand, unsteady gait, and difficulty swallowing. He did not receive IV t-PA due to late presentation.  Stroke:  Dominant infarcts possibly embolic from an unknown source.  Resultant  Dizziness, left dysmetria  MRI - Acute left PICA distribution infarction affecting the left lateral medulla and areas of the left cerebellum.  MRA - Left vertebral artery occlusion.   CTA of head and neck - pending  Carotid Doppler - refer to CTA of the neck.  2D Echo will order  LDL 118  HgbA1c pending  VTE prophylaxis - subcutaneous heparin  Diet Heart Room service appropriate?: Yes; Fluid consistency:: Thin  aspirin 325 mg daily prior to admission, now on aspirin 325 mg daily. Change to dual anti-platelet therapy for 3 months then plavix alone.  Patient counseled to be compliant with his antithrombotic medications  Ongoing aggressive stroke risk factor management  Therapy recommendations: CIR / SNF  Disposition: Pending  Hypertension  Somewhat high but stable  Permissive hypertension (OK if < 220/120) but gradually normalize in 5-7 days  Hyperlipidemia  Home meds: Lipitor 20 mg daily resumed in hospital  LDL 116, goal <  70  Increase Lipitor to 40 mg daily  Continue statin at discharge   Other Stroke Risk Factors  Advanced age  Cigarette smoker, current user  ETOH use  Coronary artery disease  Obstructive sleep apnea, on CPAP at home  Other Active Problems  Polycythemia  UDS positive for THCU   PLAN  Needs Plavix and Asa 81mg  for 3 months then plavix alone  Hospital day # 1   Personally examined patient and images, and have participated in and made any corrections needed to history, physical, neuro exam,assessment and plan as stated above.  I have personally obtained the history, evaluated lab date, reviewed imaging studies and  agree with radiology interpretations. Discussed smoking cessation with husband and wife.   Sarina Ill, MD Stroke Neurology [phone] [facility]       To contact Stroke Continuity provider, please refer to [email or web address] After hours, contact General Neurology

## 2016-03-17 NOTE — Evaluation (Signed)
Physical Therapy Evaluation Patient Details Name: [name] MRN: [id number] DOB: 12/05/1955 Today's Date: 03/17/2016   History of Present Illness  pt presents with L PICA Infarcts involving Cerebellum and Lateral Medulla.  pt also noted to have L Vertebral Artery Occlusion.  pt with hx of HTN, CAD, Cardiomyopathy, and MI x2.    Clinical Impression  Pt with diplopia, dizziness, and decreased coordination during mobility.  Pt needs continued therapy to improve overall safety and maximize recovery.  Feel pt would benefit from CIR level of therapies at D/C.  Will continue to follow.      Follow Up Recommendations CIR    Equipment Recommendations  None recommended by PT    Recommendations for Other Services Rehab consult     Precautions / Restrictions Precautions Precautions: Fall Precaution Comments: Diplopia Restrictions Weight Bearing Restrictions: No      Mobility  Bed Mobility Overal bed mobility: Needs Assistance Bed Mobility: Supine to Sit;Sit to Supine     Supine to sit: Min guard;HOB elevated Sit to supine: Min guard   General bed mobility comments: pt able to complete bed mobility without physical A, however needs cueing for gaze stabilization and sequencing.    Transfers Overall transfer level: Needs assistance Equipment used: Rolling walker (2 wheeled) Transfers: Sit to/from Stand Sit to Stand: Min assist         General transfer comment: cues for gaze stabilization and even closing L eye due to diplopia.  pt needs cues for UE use, positioning LEs, and finding balance.  A for maintaining balance with coming to/from standing.    Ambulation/Gait Ambulation/Gait assistance: Mod assist Ambulation Distance (Feet): 8 Feet Assistive device: Rolling walker (2 wheeled) Gait Pattern/deviations: Step-through pattern;Decreased stride length;Decreased weight shift to right;Scissoring;Narrow base of support     General Gait Details: pt tends to scissor L LE over  R.  pt with L and posterior lean requiring consistent A to maintain balance.  pt does indicates he can feel when he is losing his balance, but needs cues to correct.    Stairs            Wheelchair Mobility    Modified Rankin (Stroke Patients Only) Modified Rankin (Stroke Patients Only) Pre-Morbid Rankin Score: No significant disability Modified Rankin: Moderately severe disability     Balance Overall balance assessment: Needs assistance Sitting-balance support: Bilateral upper extremity supported;Feet supported Sitting balance-Leahy Scale: Poor   Postural control: Posterior lean;Left lateral lean Standing balance support: Bilateral upper extremity supported;During functional activity Standing balance-Leahy Scale: Poor Standing balance comment: Attempted to have pt utilize mirror while standing to attend to balance, however visual deficits making this difficult.                               Pertinent Vitals/Pain Pain Assessment: No/denies pain    Home Living Family/patient expects to be discharged to:: Private residence Living Arrangements: Spouse/significant other Available Help at Discharge: Family;Available PRN/intermittently Type of Home: Mobile home Home Access: Stairs to enter Entrance Stairs-Rails: Right;Left;Can reach both Entrance Stairs-Number of Steps: 3 Home Layout: One level Home Equipment: None      Prior Function Level of Independence: Independent               Hand Dominance   Dominant Hand: Right    Extremity/Trunk Assessment   Upper Extremity Assessment: Defer to OT evaluation           Lower Extremity Assessment: LLE  deficits/detail   LLE Deficits / Details: Decreased coordination.     Communication   Communication: No difficulties  Cognition Arousal/Alertness: Awake/alert Behavior During Therapy: WFL for tasks assessed/performed Overall Cognitive Status: Within Functional Limits for tasks assessed                       General Comments      Exercises        Assessment/Plan    PT Assessment Patient needs continued PT services  PT Diagnosis Difficulty walking   PT Problem List Decreased activity tolerance;Decreased balance;Decreased mobility;Decreased coordination;Decreased knowledge of use of DME;Impaired sensation  PT Treatment Interventions DME instruction;Gait training;Stair training;Functional mobility training;Therapeutic activities;Therapeutic exercise;Balance training;Neuromuscular re-education;Patient/family education   PT Goals (Current goals can be found in the Care Plan section) Acute Rehab PT Goals Patient Stated Goal: Stop the dizziness. PT Goal Formulation: With patient/family Time For Goal Achievement: 03/31/16 Potential to Achieve Goals: Good    Frequency Min 4X/week   Barriers to discharge        Co-evaluation               End of Session Equipment Utilized During Treatment: Gait belt Activity Tolerance: Patient tolerated treatment well Patient left: in bed;with call bell/phone within reach;with bed alarm set;with family/visitor present Nurse Communication: Mobility status    Functional Assessment Tool Used: Clinical Judgement Functional Limitation: Mobility: Walking and moving around Mobility: Walking and Moving Around Current Status [id number]): At least 20 percent but less than 40 percent impaired, limited or restricted Mobility: Walking and Moving Around Goal Status [phone]): 0 percent impaired, limited or restricted    Time: [id number] PT Time Calculation (min) (ACUTE ONLY): 32 min   Charges:   PT Evaluation $PT Eval Moderate Complexity: 1 Procedure PT Treatments $Gait Training: 8-22 mins   PT G Codes:   PT G-Codes **NOT FOR INPATIENT CLASS** Functional Assessment Tool Used: Clinical Judgement Functional Limitation: Mobility: Walking and moving around Mobility: Walking and Moving Around Current Status [id number]): At least 20 percent but  less than 40 percent impaired, limited or restricted Mobility: Walking and Moving Around Goal Status [phone]): 0 percent impaired, limited or restricted    Catarina Hartshorn, [place] 03/17/2016, 4:01 PM

## 2016-03-18 ENCOUNTER — Inpatient Hospital Stay (HOSPITAL_COMMUNITY): Payer: BLUE CROSS/BLUE SHIELD

## 2016-03-18 DIAGNOSIS — I6789 Other cerebrovascular disease: Secondary | ICD-10-CM

## 2016-03-18 LAB — ECHOCARDIOGRAM COMPLETE
Height: 70 in
Weight: 2225.76 oz

## 2016-03-18 MED ORDER — PERFLUTREN LIPID MICROSPHERE
1.0000 mL | INTRAVENOUS | Status: AC | PRN
  Filled 2016-03-18: qty 10

## 2016-03-18 MED ORDER — CLOPIDOGREL BISULFATE 75 MG PO TABS: 75.0000 mg | ORAL_TABLET | Freq: Every day | ORAL | Status: DC

## 2016-03-18 MED ORDER — ATORVASTATIN CALCIUM 40 MG PO TABS: 40.0000 mg | ORAL_TABLET | Freq: Every day | ORAL | Status: DC

## 2016-03-18 MED ORDER — ASPIRIN EC 81 MG PO TBEC
81.0000 mg | DELAYED_RELEASE_TABLET | Freq: Every day | ORAL | Status: DC
  Administered 2016-03-18: 81 mg via ORAL
  Filled 2016-03-18: qty 1

## 2016-03-18 MED ORDER — CLOPIDOGREL BISULFATE 75 MG PO TABS
75.0000 mg | ORAL_TABLET | Freq: Every day | ORAL | Status: DC
  Administered 2016-03-18: 75 mg via ORAL
  Filled 2016-03-18: qty 1

## 2016-03-18 MED ORDER — ASPIRIN 81 MG PO TBEC: 81.0000 mg | DELAYED_RELEASE_TABLET | Freq: Every day | ORAL | Status: DC

## 2016-03-18 NOTE — Progress Notes (Signed)
Rehab Admissions Coordinator Note:  Patient was screened by Cleatrice Burke for appropriateness for an Inpatient Acute Rehab Consult per PT and OT recommendations.  At this time, we are recommending Inpatient Rehab consult. Please place order.  Cleatrice Burke 03/18/2016, 9:44 AM  I can be reached at [phone].

## 2016-03-18 NOTE — Progress Notes (Signed)
PROGRESS NOTE                                                                                                                                                                                                             Patient Demographics:    [name], is a 60 y.o. male, DOB - [birth year], [id number]  Admit date - 03/16/2016   Admitting Physician Velvet Bathe, MD  Outpatient Primary MD for the patient is Stephens Shire, MD  LOS - 2  Outpatient Specialists: Areta Haber  Chief Complaint  Patient presents with  . Nausea  . Emesis  . Headache       Brief Narrative     Subjective:    [name] Has no new complaints no acute issues overnight.    Assessment  & Plan :    Active Problems:     Acute CVA (cerebrovascular accident) ([place])  Cerebellar stroke ([place]) - Work up per Neurology. Currently plan is for TEE - Will plan on further recommendations pending results  Mixed hyperlipidemia -  Placed on lipitor    SMOKER - recommended cessation  Hiccups - recommended peanut butter    Essential hypertension, benign - allowing for permissive hypertension  Code Status : full  Family Communication  : d/c patient and family at bedside  Disposition Plan  : pending work up  Barriers For Discharge : Neurology work up  [employer]  :  Neurologist  Procedures  : None, please refer to work up plans by neurologist  DVT Prophylaxis  :   Heparin  Lab Results  Component Value Date   PLT 171 03/16/2016    Antibiotics  :  None  Anti-infectives    None        Objective:   Filed Vitals:   03/17/16 0500 03/17/16 1351 03/17/16 2145 03/18/16 0400  BP: 168/100 122/77 125/92 145/73  Pulse: 78 84 85 87  Temp: 97.9 F (36.6 C) 98.2 F (36.8 C) 98 F (36.7 C) 98 F (36.7 C)  TempSrc: Oral Oral Oral Oral  Resp: 19 18 17 17   Height:      Weight:      SpO2:  98% 97% 100%    Wt Readings from  Last 3 Encounters:  03/16/16 63.1 kg (139 lb 1.8 oz)  01/16/16 68.493 kg (151 lb)  12/08/15 67.314  kg (148 lb 6.4 oz)     Intake/Output Summary (Last 24 hours) at 03/18/16 1558 Last data filed at 03/17/16 1630  Gross per 24 hour  Intake    200 ml  Output      0 ml  Net    200 ml    Physical Exam  Awake Alert, Oriented X 3,  Normal affect Supple Neck, No JVD, No cervical lymphadenopathy appreciated.  Symmetrical Chest wall movement, Good air movement bilaterally, CTAB RRR,No Gallops,Rubs or new Murmurs, No Parasternal Heave +ve B.Sounds, Abd Soft, No tenderness, No organomegaly appriciated, No rebound - guarding or rigidity. No Cyanosis, Clubbing or edema, No new Rash or bruise Neuro: dysmetria with finger to nose, answers questions appropriately    Data Review:    CBC  Recent Labs Lab 03/16/16 0958 03/16/16 1012  WBC 10.0  --   HGB 17.1* 18.7*  HCT 50.2 55.0*  PLT 171  --   MCV 93.3  --   MCH 31.8  --   MCHC 34.1  --   RDW 12.4  --   LYMPHSABS 1.1  --   MONOABS 0.5  --   EOSABS 0.0  --   BASOSABS 0.0  --     Chemistries   Recent Labs Lab 03/16/16 1012  NA 140  K 4.0  CL 100*  GLUCOSE 111*  BUN 10  CREATININE 0.70   ------------------------------------------------------------------------------------------------------------------  Recent Labs  03/17/16 0746  CHOL 181  HDL 47  LDLCALC 118*  TRIG 80  CHOLHDL 3.9    Lab Results  Component Value Date   HGBA1C 5.3 12/30/2013   ------------------------------------------------------------------------------------------------------------------ No results for input(s): TSH, T4TOTAL, T3FREE, THYROIDAB in the last 72 hours.  Invalid input(s): FREET3 ------------------------------------------------------------------------------------------------------------------ No results for input(s): VITAMINB12, FOLATE, FERRITIN, TIBC, IRON, RETICCTPCT in the last 72 hours.  Coagulation profile No results for  input(s): INR, PROTIME in the last 168 hours.  No results for input(s): DDIMER in the last 72 hours.  Cardiac Enzymes No results for input(s): CKMB, TROPONINI, MYOGLOBIN in the last 168 hours.  Invalid input(s): CK ------------------------------------------------------------------------------------------------------------------ No results found for: BNP  Inpatient Medications  Scheduled Meds: . antiseptic oral rinse  7 mL Mouth Rinse BID  . aspirin EC  81 mg Oral Daily  . atorvastatin  40 mg Oral q1800  . clopidogrel  75 mg Oral Daily  . heparin  5,000 Units Subcutaneous Q8H  . multivitamin with minerals  1 tablet Oral Daily   Continuous Infusions:  PRN Meds:.meclizine, menthol-cetylpyridinium, senna-docusate  Micro Results No results found for this or any previous visit (from the past 240 hour(s)).  Radiology Reports Ct Angio Head W/cm &/or Wo Cm  03/17/2016  CLINICAL DATA:  Followup stroke, LEFT vertebral artery occlusion. History of hyperlipidemia, hypertension. EXAM: CT ANGIOGRAPHY HEAD AND NECK TECHNIQUE: Multidetector CT imaging of the head and neck was performed using the standard protocol during bolus administration of intravenous contrast. Multiplanar CT image reconstructions and MIPs were obtained to evaluate the vascular anatomy. Carotid stenosis measurements (when applicable) are obtained utilizing NASCET criteria, using the distal internal carotid diameter as the denominator. CONTRAST:  50 cc Isovue 370 COMPARISON:  MRI of the brain Mar 16, 2016 FINDINGS: CT HEAD INTRACRANIAL CONTENTS: The ventricles and sulci are normal. No intraparenchymal hemorrhage, mass effect nor midline shift. No acute large vascular territory infarcts. Two subcentimeter of LEFT inferior cerebellar infarcts better characterized on recent MRI. No abnormal intracranial enhancement. No abnormal extra-axial fluid collections. Basal cisterns are patent.Mild calcific  atherosclerosis of the carotid siphons.  ORBITS: The included ocular globes and orbital contents are normal. SINUSES: The mastoid aircells and included paranasal sinuses are well-aerated. SKULL/SOFT TISSUES: No skull fracture. No significant soft tissue swelling. CTA NECK Aortic arch: Normal appearance of the thoracic arch, normal branch pattern. Mild calcific atherosclerosis of the arch vessel origins. The origins of the innominate, left Common carotid artery and subclavian artery are widely patent. Right carotid system: Common carotid artery is widely patent, coursing in a straight line fashion. Normal appearance of the carotid bifurcation without hemodynamically significant stenosis by NASCET criteria. Moderate calcific atherosclerosis. Normal appearance of the included internal carotid artery. Left carotid system: Common carotid artery is widely patent, coursing in a straight line fashion. Normal appearance of the carotid bifurcation without hemodynamically significant stenosis by NASCET criteria. Mild eccentric calcific atherosclerosis. Normal appearance of the included internal carotid artery. Vertebral arteries:Codominant vertebral arteries. Calcific atherosclerosis results in mild stenosis bilateral vertebral artery origins. Symmetric contrast opacification the bilateral P1 segments. Deformity and luminal irregularity of LEFT V2 segment at C5-6, axial 142/398. Probable dissection flap with subsequent luminal irregularity and slowly decreasing contrast opacification. Complete loss of LEFT vertebral artery contrast opacification V3 and V4 segments. RIGHT vertebral artery is widely patent. Skeleton: No acute osseous process though bone windows have not been submitted. Old RIGHT distal clavicle fracture. Status post median sternotomy. Moderate C5-6 disc height loss and uncovertebral hypertrophy resulting in severe bilateral C5-6 neural foraminal narrowing. Other neck: Soft tissues of the neck are non-acute though, not tailored for evaluation. Patulous  esophagus with air-fluid level. Subcentimeter RIGHT palatine submucosal cyst. 19 mm RIGHT paraspinal sebaceous cyst posteriorly. CTA HEAD Anterior circulation: Normal appearance of the cervical internal carotid arteries, petrous, cavernous and supra clinoid internal carotid arteries. Widely patent anterior communicating artery. Normal appearance of the anterior and middle cerebral arteries. Posterior circulation: Retrograde flow into distal LEFT V4 segment. RIGHT vertebral artery is widely patent. No contrast opacification of LEFT posterior-inferior cerebellar artery. Normal appearance of the basilar artery. Normal appearance of bilateral posterior cerebral arteries. No hemodynamically significant stenosis, contrast extravasation or aneurysm within the anterior nor posterior circulation. Mild luminal irregularity of the intracranial vessels compatible with atherosclerosis. IMPRESSION: CT HEAD: 2 small LEFT cerebellar infarcts without further propagation. Otherwise negative CT HEAD with and without contrast. CTA NECK: LEFT proximal V2 segment probable dissection, and occlusion of distal LEFT V2 segment. Patulous esophagus with air-fluid level, aspiration risk. Severe bilateral C5-6 neural foraminal narrowing. CTA HEAD: Occluded LEFT vertebral artery and LEFT posterior inferior cerebellar artery. Minimal retrograde flow into distal LEFT V4 segment. Mild luminal irregularity of the intracranial vessels compatible with atherosclerosis without flow-limiting significant stenosis. Electronically Signed   By: Elon Alas M.D.   On: 03/17/2016 23:01   Ct Head Wo Contrast  03/16/2016  CLINICAL DATA:  Headache with nausea and vomiting EXAM: CT HEAD WITHOUT CONTRAST TECHNIQUE: Contiguous axial images were obtained from the base of the skull through the vertex without intravenous contrast. COMPARISON:  July 10, 2012 FINDINGS: The ventricles are normal in size and configuration. There is no intracranial mass,  hemorrhage, extra-axial fluid collection, or midline shift. Gray-white compartments appear normal. No acute infarct evident. Bony calvarium appears intact. Mastoid air cells are clear. Orbits appear symmetric bilaterally. There is opacification in anterior right ethmoid air cell. IMPRESSION: Mild right ethmoid sinus disease. No intracranial mass hemorrhage, or focal gray - white compartment lesions/ acute appearing infarct. Electronically Signed   By: Lowella Grip III M.D.  On: 03/16/2016 10:31   Ct Angio Neck W/cm &/or Wo/cm  03/17/2016  CLINICAL DATA:  Followup stroke, LEFT vertebral artery occlusion. History of hyperlipidemia, hypertension. EXAM: CT ANGIOGRAPHY HEAD AND NECK TECHNIQUE: Multidetector CT imaging of the head and neck was performed using the standard protocol during bolus administration of intravenous contrast. Multiplanar CT image reconstructions and MIPs were obtained to evaluate the vascular anatomy. Carotid stenosis measurements (when applicable) are obtained utilizing NASCET criteria, using the distal internal carotid diameter as the denominator. CONTRAST:  50 cc Isovue 370 COMPARISON:  MRI of the brain Mar 16, 2016 FINDINGS: CT HEAD INTRACRANIAL CONTENTS: The ventricles and sulci are normal. No intraparenchymal hemorrhage, mass effect nor midline shift. No acute large vascular territory infarcts. Two subcentimeter of LEFT inferior cerebellar infarcts better characterized on recent MRI. No abnormal intracranial enhancement. No abnormal extra-axial fluid collections. Basal cisterns are patent.Mild calcific atherosclerosis of the carotid siphons. ORBITS: The included ocular globes and orbital contents are normal. SINUSES: The mastoid aircells and included paranasal sinuses are well-aerated. SKULL/SOFT TISSUES: No skull fracture. No significant soft tissue swelling. CTA NECK Aortic arch: Normal appearance of the thoracic arch, normal branch pattern. Mild calcific atherosclerosis of the  arch vessel origins. The origins of the innominate, left Common carotid artery and subclavian artery are widely patent. Right carotid system: Common carotid artery is widely patent, coursing in a straight line fashion. Normal appearance of the carotid bifurcation without hemodynamically significant stenosis by NASCET criteria. Moderate calcific atherosclerosis. Normal appearance of the included internal carotid artery. Left carotid system: Common carotid artery is widely patent, coursing in a straight line fashion. Normal appearance of the carotid bifurcation without hemodynamically significant stenosis by NASCET criteria. Mild eccentric calcific atherosclerosis. Normal appearance of the included internal carotid artery. Vertebral arteries:Codominant vertebral arteries. Calcific atherosclerosis results in mild stenosis bilateral vertebral artery origins. Symmetric contrast opacification the bilateral P1 segments. Deformity and luminal irregularity of LEFT V2 segment at C5-6, axial 142/398. Probable dissection flap with subsequent luminal irregularity and slowly decreasing contrast opacification. Complete loss of LEFT vertebral artery contrast opacification V3 and V4 segments. RIGHT vertebral artery is widely patent. Skeleton: No acute osseous process though bone windows have not been submitted. Old RIGHT distal clavicle fracture. Status post median sternotomy. Moderate C5-6 disc height loss and uncovertebral hypertrophy resulting in severe bilateral C5-6 neural foraminal narrowing. Other neck: Soft tissues of the neck are non-acute though, not tailored for evaluation. Patulous esophagus with air-fluid level. Subcentimeter RIGHT palatine submucosal cyst. 19 mm RIGHT paraspinal sebaceous cyst posteriorly. CTA HEAD Anterior circulation: Normal appearance of the cervical internal carotid arteries, petrous, cavernous and supra clinoid internal carotid arteries. Widely patent anterior communicating artery. Normal  appearance of the anterior and middle cerebral arteries. Posterior circulation: Retrograde flow into distal LEFT V4 segment. RIGHT vertebral artery is widely patent. No contrast opacification of LEFT posterior-inferior cerebellar artery. Normal appearance of the basilar artery. Normal appearance of bilateral posterior cerebral arteries. No hemodynamically significant stenosis, contrast extravasation or aneurysm within the anterior nor posterior circulation. Mild luminal irregularity of the intracranial vessels compatible with atherosclerosis. IMPRESSION: CT HEAD: 2 small LEFT cerebellar infarcts without further propagation. Otherwise negative CT HEAD with and without contrast. CTA NECK: LEFT proximal V2 segment probable dissection, and occlusion of distal LEFT V2 segment. Patulous esophagus with air-fluid level, aspiration risk. Severe bilateral C5-6 neural foraminal narrowing. CTA HEAD: Occluded LEFT vertebral artery and LEFT posterior inferior cerebellar artery. Minimal retrograde flow into distal LEFT V4 segment. Mild luminal irregularity of  the intracranial vessels compatible with atherosclerosis without flow-limiting significant stenosis. Electronically Signed   By: Elon Alas M.D.   On: 03/17/2016 23:01   Mr Jodene Nam Head Wo Contrast  03/16/2016  CLINICAL DATA:  Recent onset of neck pain and bilateral arm numbness. Acute onset of headache and inability to stand. EXAM: MR HEAD WITHOUT CONTRAST MR CIRCLE OF WILLIS WITHOUT CONTRAST MRA OF THE NECK WITHOUT AND WITH CONTRAST MRI HEAD WITH CONTRAST AND MRA HEAD WITH CONTRAST AND MRI intracranial MR venography TECHNIQUE: Multiplanar, multiecho pulse sequences of the brain and surrounding structures were obtained according to standard protocol without intravenous contrast.; Angiographic images of the Circle of Willis were obtained using MRA technique without intravenous contrast.; intracranial MR venography was performed. : COMPARISON: Head CT same day FINDINGS:  MR HEAD FINDINGS There is acute infarction affecting the left side of the medulla. There are a few punctate infarctions within the left cerebellum. Findings are consistent with left PICA distribution infarction. No acute infarction affecting the cerebral hemispheres. The brain otherwise has a normal appearance. No evidence of old infarction. No mass lesion, hemorrhage, hydrocephalus or extra-axial collection. No flow demonstrated in the left vertebral artery. No pituitary mass. No inflammatory sinus disease. MR CIRCLE OF WILLIS FINDINGS Both internal carotid arteries are widely patent into the brain. The anterior and middle cerebral vessels are patent without proximal stenosis, aneurysm or vascular malformation. There is motion degradation. The right vertebral artery is patent to the basilar. The left vertebral artery is occluded. Flow is present in right PICA. No basilar stenosis. Superior cerebellar arteries and posterior cerebral arteries are patent. MR venography Superior sagittal sinus is patent. Deep veins are patent. Transverse sinuses are patent. Both jugular vein show flow. IMPRESSION: Left vertebral artery occlusion. Acute left PICA distribution infarction affecting the left lateral medulla and areas of the left cerebellum. No swelling or hemorrhage. The remainder the brain is normal. Normal MR venography. Electronically Signed   By: Nelson Chimes M.D.   On: 03/16/2016 15:53   Mr Brain Wo Contrast  03/16/2016  CLINICAL DATA:  Recent onset of neck pain and bilateral arm numbness. Acute onset of headache and inability to stand. EXAM: MR HEAD WITHOUT CONTRAST MR CIRCLE OF WILLIS WITHOUT CONTRAST MRA OF THE NECK WITHOUT AND WITH CONTRAST MRI HEAD WITH CONTRAST AND MRA HEAD WITH CONTRAST AND MRI intracranial MR venography TECHNIQUE: Multiplanar, multiecho pulse sequences of the brain and surrounding structures were obtained according to standard protocol without intravenous contrast.; Angiographic images of  the Circle of Willis were obtained using MRA technique without intravenous contrast.; intracranial MR venography was performed. : COMPARISON: Head CT same day FINDINGS: MR HEAD FINDINGS There is acute infarction affecting the left side of the medulla. There are a few punctate infarctions within the left cerebellum. Findings are consistent with left PICA distribution infarction. No acute infarction affecting the cerebral hemispheres. The brain otherwise has a normal appearance. No evidence of old infarction. No mass lesion, hemorrhage, hydrocephalus or extra-axial collection. No flow demonstrated in the left vertebral artery. No pituitary mass. No inflammatory sinus disease. MR CIRCLE OF WILLIS FINDINGS Both internal carotid arteries are widely patent into the brain. The anterior and middle cerebral vessels are patent without proximal stenosis, aneurysm or vascular malformation. There is motion degradation. The right vertebral artery is patent to the basilar. The left vertebral artery is occluded. Flow is present in right PICA. No basilar stenosis. Superior cerebellar arteries and posterior cerebral arteries are patent. MR venography Superior sagittal sinus  is patent. Deep veins are patent. Transverse sinuses are patent. Both jugular vein show flow. IMPRESSION: Left vertebral artery occlusion. Acute left PICA distribution infarction affecting the left lateral medulla and areas of the left cerebellum. No swelling or hemorrhage. The remainder the brain is normal. Normal MR venography. Electronically Signed   By: Nelson Chimes M.D.   On: 03/16/2016 15:53   Mr Mrv Head Wo Cm  03/16/2016  CLINICAL DATA:  Recent onset of neck pain and bilateral arm numbness. Acute onset of headache and inability to stand. EXAM: MR HEAD WITHOUT CONTRAST MR CIRCLE OF WILLIS WITHOUT CONTRAST MRA OF THE NECK WITHOUT AND WITH CONTRAST MRI HEAD WITH CONTRAST AND MRA HEAD WITH CONTRAST AND MRI intracranial MR venography TECHNIQUE: Multiplanar,  multiecho pulse sequences of the brain and surrounding structures were obtained according to standard protocol without intravenous contrast.; Angiographic images of the Circle of Willis were obtained using MRA technique without intravenous contrast.; intracranial MR venography was performed. : COMPARISON: Head CT same day FINDINGS: MR HEAD FINDINGS There is acute infarction affecting the left side of the medulla. There are a few punctate infarctions within the left cerebellum. Findings are consistent with left PICA distribution infarction. No acute infarction affecting the cerebral hemispheres. The brain otherwise has a normal appearance. No evidence of old infarction. No mass lesion, hemorrhage, hydrocephalus or extra-axial collection. No flow demonstrated in the left vertebral artery. No pituitary mass. No inflammatory sinus disease. MR CIRCLE OF WILLIS FINDINGS Both internal carotid arteries are widely patent into the brain. The anterior and middle cerebral vessels are patent without proximal stenosis, aneurysm or vascular malformation. There is motion degradation. The right vertebral artery is patent to the basilar. The left vertebral artery is occluded. Flow is present in right PICA. No basilar stenosis. Superior cerebellar arteries and posterior cerebral arteries are patent. MR venography Superior sagittal sinus is patent. Deep veins are patent. Transverse sinuses are patent. Both jugular vein show flow. IMPRESSION: Left vertebral artery occlusion. Acute left PICA distribution infarction affecting the left lateral medulla and areas of the left cerebellum. No swelling or hemorrhage. The remainder the brain is normal. Normal MR venography. Electronically Signed   By: Nelson Chimes M.D.   On: 03/16/2016 15:53    Time Spent in minutes  25   Velvet Bathe M.D on 03/18/2016 at 3:58 PM  Between 7am to 7pm - Pager - [phone]  After 7pm go to [email or web address] - password [facility]  [facility] -  Office   [phone]

## 2016-03-18 NOTE — Progress Notes (Signed)
STROKE TEAM PROGRESS NOTE   HISTORY OF PRESENT ILLNESS  [name] is an 60 y.o. male history of hypertension, hyperlipidemia, coronary artery disease and arthritis, presenting with new onset vertigo and slurred speech as well as unstable gait. He is also had noticeable difficulty with swallowing. Onset of symptoms was at 5 PM on 03/15/2016. He has no previous history of stroke nor TIA. He's been taking aspirin daily. MRI of the brain showed acute left PICA distribution infarcts involving cerebellum and left lateral medulla. 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 His wife is noted slight droop of the face on the left vision is noted mild sensory changes involving the left side of his face and left hand. NIH stroke score was 3.  LSN: O'clock p.m. on 03/15/2016 tPA Given: No: Beyond time window for treatment consideration mRankin:   SUBJECTIVE (INTERVAL HISTORY) Wife at bedside, still feeling dizzy, wants to go home   OBJECTIVE Temp:  [98 F (36.7 C)-98.2 F (36.8 C)] 98 F (36.7 C) (05/14 0400) Pulse Rate:  [84-87] 87 (05/14 0400) Cardiac Rhythm:  [-] Normal sinus rhythm (05/14 0700) Resp:  [17-18] 17 (05/14 0400) BP: (122-145)/(73-92) 145/73 mmHg (05/14 0400) SpO2:  [97 %-100 %] 100 % (05/14 0400)  CBC:   Recent Labs Lab 03/16/16 0958 03/16/16 1012  WBC 10.0  --   NEUTROABS 8.4*  --   HGB 17.1* 18.7*  HCT 50.2 55.0*  MCV 93.3  --   PLT 171  --     Basic Metabolic Panel:   Recent Labs Lab 03/16/16 1012  NA 140  K 4.0  CL 100*  GLUCOSE 111*  BUN 10  CREATININE 0.70    Lipid Panel:     Component Value Date/Time   CHOL 181 03/17/2016 0746   TRIG 80 03/17/2016 0746   HDL 47 03/17/2016 0746   CHOLHDL 3.9 03/17/2016 0746   VLDL 16 03/17/2016 0746   LDLCALC 118* 03/17/2016 0746   HgbA1c:  Lab Results  Component Value Date   HGBA1C 5.3 12/30/2013   Urine Drug Screen:     Component Value Date/Time   LABOPIA NONE DETECTED 06/20/2010 1200    COCAINSCRNUR NONE DETECTED 06/20/2010 1200   LABBENZ NONE DETECTED 06/20/2010 1200   AMPHETMU NONE DETECTED 06/20/2010 1200   THCU POSITIVE* 06/20/2010 1200   LABBARB  06/20/2010 1200    NONE DETECTED        DRUG SCREEN FOR MEDICAL PURPOSES ONLY.  IF CONFIRMATION IS NEEDED FOR ANY PURPOSE, NOTIFY LAB WITHIN 5 DAYS.        LOWEST DETECTABLE LIMITS FOR URINE DRUG SCREEN Drug Class       Cutoff (ng/mL) Amphetamine      1000 Barbiturate      200 Benzodiazepine   [id number] Tricyclics       [id number] Opiates          300 Cocaine          300 THC              50      IMAGING   Ct Head Wo Contrast 03/16/2016   Mild right ethmoid sinus disease. No intracranial mass hemorrhage, or focal gray - white compartment lesions/ acute appearing infarct.   Mr Jodene Nam Head Wo Contrast 03/16/2016   Left vertebral artery occlusion. Acute left PICA distribution infarction affecting the left lateral medulla and areas of the left cerebellum. No swelling or hemorrhage. The remainder the brain is normal. Normal MR venography.  Mr Mrv Head Wo Cm 03/16/2016   Left vertebral artery occlusion. Acute left PICA distribution infarction affecting the left lateral medulla and areas of the left cerebellum. No swelling or hemorrhage. The remainder the brain is normal. Normal MR venography.     CTA of head and neck 03/17/2016  CT HEAD:  2 small LEFT cerebellar infarcts without further propagation. Otherwise negative CT HEAD with and without contrast.   CTA NECK:  LEFT proximal V2 segment probable dissection, and occlusion of distal LEFT V2 segment. Patulous esophagus with air-fluid level, aspiration risk. Severe bilateral C5-6 neural foraminal narrowing.  CTA HEAD:  Occluded LEFT vertebral artery and LEFT posterior inferior erebellar artery. Minimal retrograde flow into distal LEFT V4 segment. Mild luminal irregularity of the intracranial vessels compatible with atherosclerosis without flow-limiting significant  stenosis.     PHYSICAL EXAM  Physical exam: Exam: Gen: NAD, conversant, well nourised, obese, well groomed                     CV: RRR, no MRG. No Carotid Bruits. No peripheral edema, warm, nontender Eyes: Conjunctivae clear without exudates or hemorrhage  Neuro: Detailed Neurologic Exam  Speech:    Speech is normal; fluent and spontaneous with normal comprehension.  Cognition:    The patient is oriented to person, place, and time;  Cranial Nerves:    The right pupil round, and reactive to light, assymmetric left miosis. The fundi are normal and spontaneous venous pulsations are present. Visual fields are full to finger confrontation. Extraocular movements are intact. Left facial numbness. Left ptosis. The palate elevates in the midline. Hearing intact. Voice is normal. Shoulder shrug is normal. The tongue has normal motion without fasciculations.   Coordination:    Very mild dysmetria LUE  Motor Observation:    No asymmetry, no atrophy, and no involuntary movements noted. Tone:    Normal muscle tone.    Strength:    Strength is V/V in the upper and lower limbs.      Sensation: intact to LT     Reflex Exam:   ASSESSMENT/PLAN [name] is a 60 y.o. male with history of hyperlipidemia, hypertension, obstructive sleep apnea, ischemic cardiomyopathy, coronary artery disease with previous MI, and history of tobacco use presenting with new-onset vertigo, slurred speech, left facial droop, mild sensory changes of the left face and hand, unsteady gait, and difficulty swallowing. He did not receive IV t-PA due to late presentation.  Stroke:  Dominant infarcts possibly embolic from an unknown source.  Resultant  Dizziness, left dysmetria, horner syndrome, left facial numbness  MRI - Acute left PICA distribution infarction affecting the left lateral medulla and areas of the left cerebellum.  MRA - Left vertebral artery occlusion.   CTA of head and neck - LEFT proximal  V2 segment probable dissection, and occlusion of distal LEFT V2 segment.  Carotid Doppler - refer to CTA of the neck.  2D Echo: - Although no LV apical thrombus is seen curremtly, the apical  dyskinesis appears to be a high risk finding for apical  Recommend TEE to evaluate for thrombus. Ordered.   thrombosis and embolic events.  LDL 118  HgbA1c pending  VTE prophylaxis - subcutaneous heparin Diet Heart Room service appropriate?: Yes; Fluid consistency:: Thin  aspirin 325 mg daily prior to admission, now on aspirin 325 mg daily. Change to dual anti-platelet therapy for 3 months then plavix alone for vertebral dissection. If apical thrombosis found recommend anti-coagulation.  Patient counseled to be compliant with his antithrombotic medications  Ongoing aggressive stroke risk factor management  Therapy recommendations: CIR / SNF  Disposition: Pending  Hypertension  Somewhat high but stable  Permissive hypertension (OK if < 220/120) but gradually normalize in 5-7 days  Hyperlipidemia  Home meds: Lipitor 20 mg daily resumed in hospital  LDL 116, goal < 70  Increase Lipitor to 40 mg daily  Continue statin at discharge   Other Stroke Risk Factors  Advanced age  Cigarette smoker, current user  ETOH use  Coronary artery disease  Obstructive sleep apnea, on CPAP at home  Other Active Problems  Polycythemia  UDS positive for [facility]  LEFT proximal V2 segment probable dissection, and occlusion of distal LEFT V2 segment by CTA 03/17/16.   PLAN  Needs Plavix and Asa 81mg  for 3 months then plavix alone  for vertebral dissection. If apical thrombosis found recommend  anti-coagulation.   Smoking cessation  Hospital day # 2   Personally examined patient and images, and have participated in and made any corrections needed to history, physical, neuro exam,assessment and plan as stated above.  I have personally obtained the history, evaluated lab date,  reviewed imaging studies and agree with radiology interpretations. Discussed smoking cessation with husband and wife. Recommend TEE for evaluation of apical thrombosis. Needs Plavix and Asa 81mg  for 3 months then plavix alone for vertebral dissection. If apical thrombosis found recommend  anti-coagulation.   Sarina Ill, MD Stroke Neurology [phone] [facility]       To contact Stroke Continuity provider, please refer to [email or web address] After hours, contact General Neurology

## 2016-03-18 NOTE — Progress Notes (Signed)
Echocardiogram 2D Echocardiogram with Definity has been performed.  Tresa Res 03/18/2016, 11:39 AM

## 2016-03-18 NOTE — Discharge Summary (Signed)
Physician Discharge Summary  [name] [id number] DOB: [birth year] DOA: 03/16/2016  PCP: Stephens Shire, MD  Admit date: 03/16/2016 Discharge date: 03/18/2016  Time spent: > 35 minutes  Recommendations for Outpatient Follow-up:   Consider ordering outpatient TEE  Encourage smoking cessation.   Discharge Diagnoses:  Active Problems:   Mixed hyperlipidemia   SMOKER   Essential hypertension, benign   Cerebellar stroke ([place])   Acute CVA (cerebrovascular accident) ([place])   Ataxia   Dizziness   Discharge Condition: stable  Diet recommendation: heart healthy  Filed Weights   03/16/16 2226  Weight: 63.1 kg (139 lb 1.8 oz)    History of present illness:  [name] is an 60 y.o. male history of hypertension, hyperlipidemia, coronary artery disease and arthritis, presenting with new onset vertigo and slurred speech as well as unstable gait. He is also had noticeable difficulty with swallowing. Onset of symptoms was at 5 PM on 03/15/2016. He has no previous history of stroke nor TIA. He's been taking aspirin daily. MRI of the brain showed acute left PICA distribution infarcts involving cerebellum and left lateral medulla. 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 His wife is noted slight droop of the face on the left vision is noted mild sensory changes involving the left side of his face and left hand. NIH stroke score was 3.  Hospital Course:   According to Neurology:  Stroke: Dominant infarcts possibly embolic from an unknown source.  Resultant Dizziness, left dysmetria, horner syndrome, left facial numbness  MRI - Acute left PICA distribution infarction affecting the left lateral medulla and areas of the left cerebellum.  MRA - Left vertebral artery occlusion.   CTA of head and neck - LEFT proximal V2 segment probable dissection, and occlusion of distal LEFT V2 segment.  Carotid Doppler - refer to CTA of the neck.  2D Echo: - Although no LV apical  thrombus is seen curremtly, the apical dyskinesis appears to be a high risk finding for apical  Recommend TEE to evaluate for thrombus. Ordered.   thrombosis and embolic events.  LDL 118  HgbA1c pending  VTE prophylaxis - subcutaneous heparin  Diet Heart Room service appropriate?: Yes; Fluid consistency:: Thin  aspirin 325 mg daily prior to admission, now on aspirin 325 mg daily. Change to dual anti-platelet therapy for 3 months then plavix alone for vertebral dissection. If apical thrombosis found recommend anti-coagulation.     Patient counseled to be compliant with his antithrombotic medications  Ongoing aggressive stroke risk factor management  Therapy recommendations: CIR / SNF  Disposition: Pending  Hypertension  Somewhat high but stable  Permissive hypertension (OK if < 220/120) but gradually normalize in 5-7 days  Hyperlipidemia  Home meds: Lipitor 20 mg daily resumed in hospital  LDL 116, goal < 70  Increase Lipitor to 40 mg daily  Continue statin at discharge   Other Stroke Risk Factors  Advanced age  Cigarette smoker, current user  ETOH use  Coronary artery disease  Obstructive sleep apnea, on CPAP at home  Other Active Problems  Polycythemia  UDS positive for [facility]  LEFT proximal V2 segment probable dissection, and occlusion of distal LEFT V2 segment by CTA 03/17/16.   PLAN  Needs Plavix and Asa 81mg  for 3 months then plavix alone for vertebral dissection. If apical thrombosis found recommend anti-coagulation.   Smoking cessation  Patient was recommended TEE to assess for clots. He declined. He also declined SNF or CIR. Neurology discussed risks associated with undiagnosed clots within the  heart. Per my discussion with neuro he declined anticoagulation.  He would like to follow up with his cardiologist. I have placed order for physical therapy  Procedures:  Please see above  Consultations:  Neurology  Discharge  Exam: Filed Vitals:   03/17/16 2145 03/18/16 0400  BP: 125/92 145/73  Pulse: 85 87  Temp: 98 F (36.7 C) 98 F (36.7 C)  Resp: 17 17    General: Pt in nad, alert and awake3 Cardiovascular: rrr, no rubs Respiratory: no increased wob, no wheezes  Discharge Instructions   Discharge Instructions    Ambulatory referral to Neurology    Complete by:  As directed   Dr. Jaynee Eagles requests follow up for this patient in 2 months.     Call MD for:  extreme fatigue    Complete by:  As directed      Call MD for:  temperature >100.4    Complete by:  As directed      Diet - low sodium heart healthy    Complete by:  As directed      Discharge instructions    Complete by:  As directed   Please be sure to follow up with your cardiologist and neurologist after hospital discharge. In 5 days may start taking your blood pressure/cardiac medications again.     Increase activity slowly    Complete by:  As directed           Current Discharge Medication List    START taking these medications   Details  aspirin EC 81 MG EC tablet Take 1 tablet (81 mg total) by mouth daily. Qty: 30 tablet, Refills: 0    clopidogrel (PLAVIX) 75 MG tablet Take 1 tablet (75 mg total) by mouth daily. Qty: 30 tablet, Refills: 0      CONTINUE these medications which have CHANGED   Details  atorvastatin (LIPITOR) 40 MG tablet Take 1 tablet (40 mg total) by mouth daily at 6 PM. Qty: 30 tablet, Refills: 00      CONTINUE these medications which have NOT CHANGED   Details  Multiple Vitamins-Minerals (MULTIVITAMIN WITH MINERALS) tablet Take 1 tablet by mouth daily.      STOP taking these medications     aspirin 325 MG tablet      carvedilol (COREG) 3.125 MG tablet      HYDROcodone-acetaminophen (NORCO/VICODIN) 5-325 MG tablet      losartan (COZAAR) 50 MG tablet      triamcinolone ointment (KENALOG) 0.5 %        Allergies  Allergen Reactions  . Hydrocodone     Throat itchy, weakness   Follow-up  Information    Follow up with Melvenia Beam, MD. Schedule an appointment as soon as possible for a visit in 2 months.   Specialty:  Neurology   Contact information:   [address] [place] [address] [phone]        The results of significant diagnostics from this hospitalization (including imaging, microbiology, ancillary and laboratory) are listed below for reference.    Significant Diagnostic Studies: Ct Angio Head W/cm &/or Wo Cm  03/17/2016  CLINICAL DATA:  Followup stroke, LEFT vertebral artery occlusion. History of hyperlipidemia, hypertension. EXAM: CT ANGIOGRAPHY HEAD AND NECK TECHNIQUE: Multidetector CT imaging of the head and neck was performed using the standard protocol during bolus administration of intravenous contrast. Multiplanar CT image reconstructions and MIPs were obtained to evaluate the vascular anatomy. Carotid stenosis measurements (when applicable) are obtained utilizing NASCET  criteria, using the distal internal carotid diameter as the denominator. CONTRAST:  50 cc Isovue 370 COMPARISON:  MRI of the brain Mar 16, 2016 FINDINGS: CT HEAD INTRACRANIAL CONTENTS: The ventricles and sulci are normal. No intraparenchymal hemorrhage, mass effect nor midline shift. No acute large vascular territory infarcts. Two subcentimeter of LEFT inferior cerebellar infarcts better characterized on recent MRI. No abnormal intracranial enhancement. No abnormal extra-axial fluid collections. Basal cisterns are patent.Mild calcific atherosclerosis of the carotid siphons. ORBITS: The included ocular globes and orbital contents are normal. SINUSES: The mastoid aircells and included paranasal sinuses are well-aerated. SKULL/SOFT TISSUES: No skull fracture. No significant soft tissue swelling. CTA NECK Aortic arch: Normal appearance of the thoracic arch, normal branch pattern. Mild calcific atherosclerosis of the arch vessel origins. The origins of the innominate, left Common carotid  artery and subclavian artery are widely patent. Right carotid system: Common carotid artery is widely patent, coursing in a straight line fashion. Normal appearance of the carotid bifurcation without hemodynamically significant stenosis by NASCET criteria. Moderate calcific atherosclerosis. Normal appearance of the included internal carotid artery. Left carotid system: Common carotid artery is widely patent, coursing in a straight line fashion. Normal appearance of the carotid bifurcation without hemodynamically significant stenosis by NASCET criteria. Mild eccentric calcific atherosclerosis. Normal appearance of the included internal carotid artery. Vertebral arteries:Codominant vertebral arteries. Calcific atherosclerosis results in mild stenosis bilateral vertebral artery origins. Symmetric contrast opacification the bilateral P1 segments. Deformity and luminal irregularity of LEFT V2 segment at C5-6, axial 142/398. Probable dissection flap with subsequent luminal irregularity and slowly decreasing contrast opacification. Complete loss of LEFT vertebral artery contrast opacification V3 and V4 segments. RIGHT vertebral artery is widely patent. Skeleton: No acute osseous process though bone windows have not been submitted. Old RIGHT distal clavicle fracture. Status post median sternotomy. Moderate C5-6 disc height loss and uncovertebral hypertrophy resulting in severe bilateral C5-6 neural foraminal narrowing. Other neck: Soft tissues of the neck are non-acute though, not tailored for evaluation. Patulous esophagus with air-fluid level. Subcentimeter RIGHT palatine submucosal cyst. 19 mm RIGHT paraspinal sebaceous cyst posteriorly. CTA HEAD Anterior circulation: Normal appearance of the cervical internal carotid arteries, petrous, cavernous and supra clinoid internal carotid arteries. Widely patent anterior communicating artery. Normal appearance of the anterior and middle cerebral arteries. Posterior circulation:  Retrograde flow into distal LEFT V4 segment. RIGHT vertebral artery is widely patent. No contrast opacification of LEFT posterior-inferior cerebellar artery. Normal appearance of the basilar artery. Normal appearance of bilateral posterior cerebral arteries. No hemodynamically significant stenosis, contrast extravasation or aneurysm within the anterior nor posterior circulation. Mild luminal irregularity of the intracranial vessels compatible with atherosclerosis. IMPRESSION: CT HEAD: 2 small LEFT cerebellar infarcts without further propagation. Otherwise negative CT HEAD with and without contrast. CTA NECK: LEFT proximal V2 segment probable dissection, and occlusion of distal LEFT V2 segment. Patulous esophagus with air-fluid level, aspiration risk. Severe bilateral C5-6 neural foraminal narrowing. CTA HEAD: Occluded LEFT vertebral artery and LEFT posterior inferior cerebellar artery. Minimal retrograde flow into distal LEFT V4 segment. Mild luminal irregularity of the intracranial vessels compatible with atherosclerosis without flow-limiting significant stenosis. Electronically Signed   By: Elon Alas M.D.   On: 03/17/2016 23:01   Ct Head Wo Contrast  03/16/2016  CLINICAL DATA:  Headache with nausea and vomiting EXAM: CT HEAD WITHOUT CONTRAST TECHNIQUE: Contiguous axial images were obtained from the base of the skull through the vertex without intravenous contrast. COMPARISON:  July 10, 2012 FINDINGS: The ventricles are normal in size  and configuration. There is no intracranial mass, hemorrhage, extra-axial fluid collection, or midline shift. Gray-white compartments appear normal. No acute infarct evident. Bony calvarium appears intact. Mastoid air cells are clear. Orbits appear symmetric bilaterally. There is opacification in anterior right ethmoid air cell. IMPRESSION: Mild right ethmoid sinus disease. No intracranial mass hemorrhage, or focal gray - white compartment lesions/ acute appearing  infarct. Electronically Signed   By: Lowella Grip III M.D.   On: 03/16/2016 10:31   Ct Angio Neck W/cm &/or Wo/cm  03/17/2016  CLINICAL DATA:  Followup stroke, LEFT vertebral artery occlusion. History of hyperlipidemia, hypertension. EXAM: CT ANGIOGRAPHY HEAD AND NECK TECHNIQUE: Multidetector CT imaging of the head and neck was performed using the standard protocol during bolus administration of intravenous contrast. Multiplanar CT image reconstructions and MIPs were obtained to evaluate the vascular anatomy. Carotid stenosis measurements (when applicable) are obtained utilizing NASCET criteria, using the distal internal carotid diameter as the denominator. CONTRAST:  50 cc Isovue 370 COMPARISON:  MRI of the brain Mar 16, 2016 FINDINGS: CT HEAD INTRACRANIAL CONTENTS: The ventricles and sulci are normal. No intraparenchymal hemorrhage, mass effect nor midline shift. No acute large vascular territory infarcts. Two subcentimeter of LEFT inferior cerebellar infarcts better characterized on recent MRI. No abnormal intracranial enhancement. No abnormal extra-axial fluid collections. Basal cisterns are patent.Mild calcific atherosclerosis of the carotid siphons. ORBITS: The included ocular globes and orbital contents are normal. SINUSES: The mastoid aircells and included paranasal sinuses are well-aerated. SKULL/SOFT TISSUES: No skull fracture. No significant soft tissue swelling. CTA NECK Aortic arch: Normal appearance of the thoracic arch, normal branch pattern. Mild calcific atherosclerosis of the arch vessel origins. The origins of the innominate, left Common carotid artery and subclavian artery are widely patent. Right carotid system: Common carotid artery is widely patent, coursing in a straight line fashion. Normal appearance of the carotid bifurcation without hemodynamically significant stenosis by NASCET criteria. Moderate calcific atherosclerosis. Normal appearance of the included internal carotid artery.  Left carotid system: Common carotid artery is widely patent, coursing in a straight line fashion. Normal appearance of the carotid bifurcation without hemodynamically significant stenosis by NASCET criteria. Mild eccentric calcific atherosclerosis. Normal appearance of the included internal carotid artery. Vertebral arteries:Codominant vertebral arteries. Calcific atherosclerosis results in mild stenosis bilateral vertebral artery origins. Symmetric contrast opacification the bilateral P1 segments. Deformity and luminal irregularity of LEFT V2 segment at C5-6, axial 142/398. Probable dissection flap with subsequent luminal irregularity and slowly decreasing contrast opacification. Complete loss of LEFT vertebral artery contrast opacification V3 and V4 segments. RIGHT vertebral artery is widely patent. Skeleton: No acute osseous process though bone windows have not been submitted. Old RIGHT distal clavicle fracture. Status post median sternotomy. Moderate C5-6 disc height loss and uncovertebral hypertrophy resulting in severe bilateral C5-6 neural foraminal narrowing. Other neck: Soft tissues of the neck are non-acute though, not tailored for evaluation. Patulous esophagus with air-fluid level. Subcentimeter RIGHT palatine submucosal cyst. 19 mm RIGHT paraspinal sebaceous cyst posteriorly. CTA HEAD Anterior circulation: Normal appearance of the cervical internal carotid arteries, petrous, cavernous and supra clinoid internal carotid arteries. Widely patent anterior communicating artery. Normal appearance of the anterior and middle cerebral arteries. Posterior circulation: Retrograde flow into distal LEFT V4 segment. RIGHT vertebral artery is widely patent. No contrast opacification of LEFT posterior-inferior cerebellar artery. Normal appearance of the basilar artery. Normal appearance of bilateral posterior cerebral arteries. No hemodynamically significant stenosis, contrast extravasation or aneurysm within the  anterior nor posterior circulation. Mild luminal irregularity of  the intracranial vessels compatible with atherosclerosis. IMPRESSION: CT HEAD: 2 small LEFT cerebellar infarcts without further propagation. Otherwise negative CT HEAD with and without contrast. CTA NECK: LEFT proximal V2 segment probable dissection, and occlusion of distal LEFT V2 segment. Patulous esophagus with air-fluid level, aspiration risk. Severe bilateral C5-6 neural foraminal narrowing. CTA HEAD: Occluded LEFT vertebral artery and LEFT posterior inferior cerebellar artery. Minimal retrograde flow into distal LEFT V4 segment. Mild luminal irregularity of the intracranial vessels compatible with atherosclerosis without flow-limiting significant stenosis. Electronically Signed   By: Elon Alas M.D.   On: 03/17/2016 23:01   Mr Jodene Nam Head Wo Contrast  03/16/2016  CLINICAL DATA:  Recent onset of neck pain and bilateral arm numbness. Acute onset of headache and inability to stand. EXAM: MR HEAD WITHOUT CONTRAST MR CIRCLE OF WILLIS WITHOUT CONTRAST MRA OF THE NECK WITHOUT AND WITH CONTRAST MRI HEAD WITH CONTRAST AND MRA HEAD WITH CONTRAST AND MRI intracranial MR venography TECHNIQUE: Multiplanar, multiecho pulse sequences of the brain and surrounding structures were obtained according to standard protocol without intravenous contrast.; Angiographic images of the Circle of Willis were obtained using MRA technique without intravenous contrast.; intracranial MR venography was performed. : COMPARISON: Head CT same day FINDINGS: MR HEAD FINDINGS There is acute infarction affecting the left side of the medulla. There are a few punctate infarctions within the left cerebellum. Findings are consistent with left PICA distribution infarction. No acute infarction affecting the cerebral hemispheres. The brain otherwise has a normal appearance. No evidence of old infarction. No mass lesion, hemorrhage, hydrocephalus or extra-axial collection. No flow  demonstrated in the left vertebral artery. No pituitary mass. No inflammatory sinus disease. MR CIRCLE OF WILLIS FINDINGS Both internal carotid arteries are widely patent into the brain. The anterior and middle cerebral vessels are patent without proximal stenosis, aneurysm or vascular malformation. There is motion degradation. The right vertebral artery is patent to the basilar. The left vertebral artery is occluded. Flow is present in right PICA. No basilar stenosis. Superior cerebellar arteries and posterior cerebral arteries are patent. MR venography Superior sagittal sinus is patent. Deep veins are patent. Transverse sinuses are patent. Both jugular vein show flow. IMPRESSION: Left vertebral artery occlusion. Acute left PICA distribution infarction affecting the left lateral medulla and areas of the left cerebellum. No swelling or hemorrhage. The remainder the brain is normal. Normal MR venography. Electronically Signed   By: Nelson Chimes M.D.   On: 03/16/2016 15:53   Mr Brain Wo Contrast  03/16/2016  CLINICAL DATA:  Recent onset of neck pain and bilateral arm numbness. Acute onset of headache and inability to stand. EXAM: MR HEAD WITHOUT CONTRAST MR CIRCLE OF WILLIS WITHOUT CONTRAST MRA OF THE NECK WITHOUT AND WITH CONTRAST MRI HEAD WITH CONTRAST AND MRA HEAD WITH CONTRAST AND MRI intracranial MR venography TECHNIQUE: Multiplanar, multiecho pulse sequences of the brain and surrounding structures were obtained according to standard protocol without intravenous contrast.; Angiographic images of the Circle of Willis were obtained using MRA technique without intravenous contrast.; intracranial MR venography was performed. : COMPARISON: Head CT same day FINDINGS: MR HEAD FINDINGS There is acute infarction affecting the left side of the medulla. There are a few punctate infarctions within the left cerebellum. Findings are consistent with left PICA distribution infarction. No acute infarction affecting the  cerebral hemispheres. The brain otherwise has a normal appearance. No evidence of old infarction. No mass lesion, hemorrhage, hydrocephalus or extra-axial collection. No flow demonstrated in the left vertebral artery. No pituitary  mass. No inflammatory sinus disease. MR CIRCLE OF WILLIS FINDINGS Both internal carotid arteries are widely patent into the brain. The anterior and middle cerebral vessels are patent without proximal stenosis, aneurysm or vascular malformation. There is motion degradation. The right vertebral artery is patent to the basilar. The left vertebral artery is occluded. Flow is present in right PICA. No basilar stenosis. Superior cerebellar arteries and posterior cerebral arteries are patent. MR venography Superior sagittal sinus is patent. Deep veins are patent. Transverse sinuses are patent. Both jugular vein show flow. IMPRESSION: Left vertebral artery occlusion. Acute left PICA distribution infarction affecting the left lateral medulla and areas of the left cerebellum. No swelling or hemorrhage. The remainder the brain is normal. Normal MR venography. Electronically Signed   By: Nelson Chimes M.D.   On: 03/16/2016 15:53   Mr Mrv Head Wo Cm  03/16/2016  CLINICAL DATA:  Recent onset of neck pain and bilateral arm numbness. Acute onset of headache and inability to stand. EXAM: MR HEAD WITHOUT CONTRAST MR CIRCLE OF WILLIS WITHOUT CONTRAST MRA OF THE NECK WITHOUT AND WITH CONTRAST MRI HEAD WITH CONTRAST AND MRA HEAD WITH CONTRAST AND MRI intracranial MR venography TECHNIQUE: Multiplanar, multiecho pulse sequences of the brain and surrounding structures were obtained according to standard protocol without intravenous contrast.; Angiographic images of the Circle of Willis were obtained using MRA technique without intravenous contrast.; intracranial MR venography was performed. : COMPARISON: Head CT same day FINDINGS: MR HEAD FINDINGS There is acute infarction affecting the left side of the  medulla. There are a few punctate infarctions within the left cerebellum. Findings are consistent with left PICA distribution infarction. No acute infarction affecting the cerebral hemispheres. The brain otherwise has a normal appearance. No evidence of old infarction. No mass lesion, hemorrhage, hydrocephalus or extra-axial collection. No flow demonstrated in the left vertebral artery. No pituitary mass. No inflammatory sinus disease. MR CIRCLE OF WILLIS FINDINGS Both internal carotid arteries are widely patent into the brain. The anterior and middle cerebral vessels are patent without proximal stenosis, aneurysm or vascular malformation. There is motion degradation. The right vertebral artery is patent to the basilar. The left vertebral artery is occluded. Flow is present in right PICA. No basilar stenosis. Superior cerebellar arteries and posterior cerebral arteries are patent. MR venography Superior sagittal sinus is patent. Deep veins are patent. Transverse sinuses are patent. Both jugular vein show flow. IMPRESSION: Left vertebral artery occlusion. Acute left PICA distribution infarction affecting the left lateral medulla and areas of the left cerebellum. No swelling or hemorrhage. The remainder the brain is normal. Normal MR venography. Electronically Signed   By: Nelson Chimes M.D.   On: 03/16/2016 15:53    Microbiology: No results found for this or any previous visit (from the past 240 hour(s)).   Labs: Basic Metabolic Panel:  Recent Labs Lab 03/16/16 1012  NA 140  K 4.0  CL 100*  GLUCOSE 111*  BUN 10  CREATININE 0.70   Liver Function Tests: No results for input(s): AST, ALT, ALKPHOS, BILITOT, PROT, ALBUMIN in the last 168 hours. No results for input(s): LIPASE, AMYLASE in the last 168 hours. No results for input(s): AMMONIA in the last 168 hours. CBC:  Recent Labs Lab 03/16/16 0958 03/16/16 1012  WBC 10.0  --   NEUTROABS 8.4*  --   HGB 17.1* 18.7*  HCT 50.2 55.0*  MCV 93.3   --   PLT 171  --    Cardiac Enzymes: No results for input(s): CKTOTAL,  CKMB, CKMBINDEX, TROPONINI in the last 168 hours. BNP: BNP (last 3 results) No results for input(s): BNP in the last 8760 hours.  ProBNP (last 3 results) No results for input(s): PROBNP in the last 8760 hours.  CBG: No results for input(s): GLUCAP in the last 168 hours.  Signed:  Velvet Bathe MD.  [facility] 03/18/2016, 5:25 PM

## 2016-03-19 ENCOUNTER — Telehealth: Payer: Self-pay | Admitting: Cardiovascular Disease

## 2016-03-19 LAB — HEMOGLOBIN A1C
Hgb A1c MFr Bld: 5.1 % (ref 4.8–5.6)
Mean Plasma Glucose: 100 mg/dL

## 2016-03-19 NOTE — Telephone Encounter (Signed)
Called patient about his message below. Encouraged patient to follow discharge instructions from hospital. Made an appointment with Melina Copa PA for follow-up on 04/03/16. Patient was scheduled for orthopedic surgery 04/04/16. Clearance for surgery was signed over a week ago prior to patient's CVA. Informed patient that Dr. Johnsie Cancel would be informed of his recent changes, and we will call back with any further advisement.

## 2016-03-19 NOTE — Telephone Encounter (Signed)
New message     Pt had a stroke over the weekend and was discharged Sunday.  He has several medication changes he need to let Dr Johnsie Cancel know about.   Start 81mg  aspirin and stop 325 aspirin Start 40mg  atorvastatin daily and stop 20mg  atorvastatin Start 75mg  plavix Stop carvediilol 3.125 and losartan 50mg  for 5 days then resume taking them. Is it ok to change medications and will he need to have an follow up appt here?

## 2016-03-19 NOTE — Telephone Encounter (Signed)
Orson Slick called back. Informed her that at this time Dr. Johnsie Cancel cannot clear patient for surgery at this time.

## 2016-03-19 NOTE — Telephone Encounter (Signed)
F.u with me next week no orthopedic surgery should have TEE and ILR

## 2016-03-19 NOTE — Telephone Encounter (Signed)
Called and left message for Orson Slick at orthopedic office to call back.

## 2016-03-19 NOTE — Telephone Encounter (Signed)
Called patient back and informed him of Dr. Kyla Balzarine recommendations. Will put patient on the schedule for next week.

## 2016-03-20 DIAGNOSIS — F17200 Nicotine dependence, unspecified, uncomplicated: Secondary | ICD-10-CM | POA: Insufficient documentation

## 2016-03-20 DIAGNOSIS — R42 Dizziness and giddiness: Secondary | ICD-10-CM | POA: Insufficient documentation

## 2016-03-20 MED FILL — Perflutren Lipid Microsphere IV Susp 1.1 MG/ML: INTRAVENOUS | Qty: 10 | Status: AC

## 2016-03-21 ENCOUNTER — Telehealth: Payer: Self-pay | Admitting: Neurology

## 2016-03-21 ENCOUNTER — Other Ambulatory Visit: Payer: Self-pay | Admitting: Neurology

## 2016-03-21 MED ORDER — CHLORPROMAZINE HCL 25 MG PO TABS
25.0000 mg | ORAL_TABLET | Freq: Three times a day (TID) | ORAL | Status: DC
Start: 2016-03-21 — End: 2016-04-05

## 2016-03-21 NOTE — Telephone Encounter (Signed)
Tried calling again. Went to VM. Not set up. Unable to LVM.

## 2016-03-21 NOTE — Telephone Encounter (Signed)
Pt's wife called sts Dr Jaynee Eagles saw pt for stroke in hospital on 03/18/16. She said he had hiccups that started on 03/15/16 and he still has the hiccups. She said his stomach, across his chest, down bil rib cage is sore. Michela Pitcher he is getting about 2 hours of sleep at night and doses a little during the day. Said he saw PCP yesterday for follow up from hospital and was given Z-pack for congestion. She is wanting to know what is the next step, who would he see for this condition.

## 2016-03-21 NOTE — Telephone Encounter (Signed)
Tried calling back. VM not set up and unable to LVM. Will try again later.

## 2016-03-21 NOTE — Telephone Encounter (Signed)
Sandy, please send all messages directly to [name] unless she is not here thanks.  Emma, I called in Thorazine. He can take it up to 3x a day for 10 days. This is a medication we use for hiccups and hopefully it will help. He can follow up with me in the office first available. We are using a very low dose so side effects are less comoon but they can occur:  Side effects can include the following:  Abnormal movements of the body or mouth, fevers, tremors, low white blood cells, erection lasting longer than 4 hours, heart rhythm abnormalities, seizures, stop taking abd to to ED if these happen. Common side effects include drowsiness, low BP, vision changes, dry mouth, constipation and other side effects.

## 2016-03-22 NOTE — Telephone Encounter (Signed)
Called and spoke to [place], wife. Relayed Dr Jaynee Eagles message below. They have f/u with Dr Jaynee Eagles on 05/16/16 at 16am, check in [place] per Dr Jaynee Eagles to keep this appt as long as he is doing well. She knows to call if he experiences any SE from thorazine. She verbalized understanding and stated she picked up rx yesterday.

## 2016-03-26 ENCOUNTER — Other Ambulatory Visit: Payer: Self-pay | Admitting: Neurology

## 2016-03-26 ENCOUNTER — Telehealth: Payer: Self-pay | Admitting: *Deleted

## 2016-03-26 DIAGNOSIS — I63542 Cerebral infarction due to unspecified occlusion or stenosis of left cerebellar artery: Secondary | ICD-10-CM

## 2016-03-26 DIAGNOSIS — I63442 Cerebral infarction due to embolism of left cerebellar artery: Secondary | ICD-10-CM

## 2016-03-26 NOTE — Telephone Encounter (Signed)
Caller [facility]              CID [id number]  Patient [name]    Pt's Dr Jaynee Eagles        Area Code 336 Phone# [phone] [id number] ONE HAS COME FROM      [facility]AP[facility] FOR REHABIL[facility]                  Disp:Y/N N If Y = C/B If No Response In 22minutes ============================================================

## 2016-03-26 NOTE — Telephone Encounter (Signed)
Dr Jaynee Eagles- please advise. This is the pt you saw in the hospital. Has not yet seen you in the office.

## 2016-03-26 NOTE — Telephone Encounter (Signed)
The hospital should have done that. I ordered both physical therapy and occupational therapy thanks

## 2016-03-26 NOTE — Telephone Encounter (Signed)
Dr Jaynee Eagles- FYI Called and spoke to pt. Advised Dr Jaynee Eagles placed referrals to both PT/OT. Someone should be contacting him to schedule within a week or two. If he does not hear, he is to call me back. He verbalized understanding. I asked pt if hiccups have improved. He states that has been taken care of and they have improved.

## 2016-03-27 ENCOUNTER — Inpatient Hospital Stay (HOSPITAL_COMMUNITY): Admission: RE | Admit: 2016-03-27 | Payer: BLUE CROSS/BLUE SHIELD | Source: Ambulatory Visit

## 2016-03-27 NOTE — Telephone Encounter (Signed)
Dr Jaynee Eagles- please advise. Not sure how to do this since pt has not been seen in the office before?

## 2016-03-27 NOTE — Telephone Encounter (Signed)
[name], I saw them in the office. Place the order for in home PT and OT thanks. But the hospital should have done all of this. Thanks

## 2016-03-27 NOTE — Telephone Encounter (Signed)
Patient's wife called and stated they need an order for in-home therapy.  Thanks!

## 2016-03-27 NOTE — Progress Notes (Signed)
Patient ID: [name], male   DOB: 08/28/1956, 60 y.o.   MRN: [id number]   60 y.o. male with history of CAD s/p bare metal stent to RCA in 2002 and 2008 and bare metal stent LAD in 2002 and 2008, ICM (EF ~ 40%), HTN, HL, OSA, GERD, tobacco abuse 2015 stress myoview with anterior and inferior wall scar with reduced LVEF, no clear ischemia. Cardiac cath done 12/22/13 with progressive disease and CABG recommended   CABG 01/01/14  with Dr Cyndia Bent This included LIMA to LAD and SVG to PDA EF 30-35% by echo   Has not smoked since surgery   Echo 05/27/14 EF 35-40%  MUGA  06/10/14  EF 43% 10/26/15  ABI's normal    Seein by Doctor Allred and felt to be not a candidate for CRT or AICD   03/15/16  Presenting with new onset vertigo and slurred speech as well as unstable gait. He is also had noticeable difficulty with swallowing. Onset of symptoms was at 5 PM on 03/15/2016. He has no previous history of stroke nor TIA. He's been taking aspirin daily. MRI of the brain showed acute left PICA distribution infarcts involving cerebellum and left lateral medulla. 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 There was mild focal  basal hypertrophy of the septum. Systolic function was moderately  reduced. The estimated ejection fraction was in the range of 35%  to 40%. Dyskinesis and aneurysmal deformity of the  mid-apicalanteroseptal, inferior, inferoseptal, and apical  myocardium. Doppler parameters are consistent with abnormal left  ventricular relaxation (grade 1 diastolic dysfunction). Acoustic  contrast opacification revealed no evidence ofthrombus. - Left atrium: The atrium was mildly dilated. - Atrial  septum: No defect or patent foramen ovale was identified.  Impressions:  - Although no LV apical thrombus is seen curremtly, the apical  dyskinesis appears to be a high risk finding for apical  thrombosis and embolic events.  Having trouble with balance and walking still Starting in house PT this week.  Still smoking a bit   ROS: Denies fever, malais, weight loss, blurry vision, decreased visual acuity, cough, sputum, SOB, hemoptysis, pleuritic pain, palpitaitons, heartburn, abdominal pain, melena, lower extremity edema, claudication, or rash.  All other systems reviewed and negative  General: Affect appropriate Healthy:  appears stated age 50: normal Neck supple with no adenopathy JVP normal no bruits no thyromegaly Lungs clear with no wheezing and good diaphragmatic motion Heart:  S1/S2 no murmur, no rub, gallop or click PMI normal Abdomen: benighn, BS positve, no tenderness, no AAA no bruit.  No HSM or HJR Distal pulses intact with no bruits No edema Neuro non-focal Skin warm and dry No muscular weakness   Current Outpatient Prescriptions  Medication Sig Dispense Refill  . aspirin EC 81 MG EC tablet Take 1 tablet (81 mg total) by mouth daily. 30 tablet 0  . atorvastatin (LIPITOR) 40 MG tablet Take 1 tablet (40 mg total) by mouth daily at 6 PM. 30 tablet 00  . chlorproMAZINE (THORAZINE) 25 MG tablet Take 1 tablet (25 mg total) by mouth 3 (three) times daily. 30 tablet 0  . clopidogrel (PLAVIX) 75 MG tablet Take 1 tablet (75 mg total)  by mouth daily. 30 tablet 0  . Multiple Vitamins-Minerals (MULTIVITAMIN WITH MINERALS) tablet Take 1 tablet by mouth daily.    . metoprolol tartrate (LOPRESSOR) 25 MG tablet Take 0.5 tablets (12.5 mg total) by mouth 2 (two) times daily. 45 tablet 3  . varenicline (CHANTIX STARTING MONTH PAK) 0.5 MG X 11 & 1 MG X 42 tablet Take one 0.5 mg tablet by mouth once daily for 3 days, then increase to one 0.5 mg tablet twice daily for 4 days, then  increase to one 1 mg tablet twice daily. 53 tablet 0   No current facility-administered medications for this visit.    Allergies  Hydrocodone  Electrocardiogram:  06/24/14  SR rate 62 anterolateral MI with persistent lateral T wave inversions  QRS normal    07/07/15 SR ate 73  Anterolateral T wave changes persist   Assessment and Plan Cerebellar Stroke:   PT important D/C smoking important will call chantix in .  Will refer to EP for loop recorder Two different issue vertebral artery dissection and also ? Embolic events.  Need to r/o PAF  CAD: Stable with no angina and good activity level.  Add lopressor for better BP and HR control DCM  euvolemic functional class one continue current meds EF 43% MUGA 2015  Chol:  Lab Results  Component Value Date   LDLCALC 118* 03/17/2016   lasbs with Dr Tollie Pizza HTN: Well controlled.  Continue current medications and low sodium Dash type diet.   Carpel Tunnel:  Vs cervical spine disease with see Dr Rolena Infante to get MRI of c spine  Back Pain:  F/u [facility] for injection.  Swimming and doing PT  F/u Dr Rolena Infante    PT/OT Lopressor 12.5 bid Chantix Refer EP for loop recorder   Jenkins Rouge

## 2016-03-28 ENCOUNTER — Encounter (HOSPITAL_COMMUNITY): Payer: Self-pay | Admitting: Pharmacy Technician

## 2016-03-28 ENCOUNTER — Ambulatory Visit (INDEPENDENT_AMBULATORY_CARE_PROVIDER_SITE_OTHER): Payer: BLUE CROSS/BLUE SHIELD | Admitting: Cardiovascular Disease

## 2016-03-28 ENCOUNTER — Encounter: Payer: Self-pay | Admitting: Cardiovascular Disease

## 2016-03-28 VITALS — BP 146/94 | HR 85 | Ht 68.0 in | Wt 147.1 lb

## 2016-03-28 DIAGNOSIS — Z09 Encounter for follow-up examination after completed treatment for conditions other than malignant neoplasm: Secondary | ICD-10-CM | POA: Diagnosis not present

## 2016-03-28 DIAGNOSIS — I429 Cardiomyopathy, unspecified: Secondary | ICD-10-CM

## 2016-03-28 MED ORDER — VARENICLINE TARTRATE 0.5 MG X 11 & 1 MG X 42 PO MISC: ORAL | Status: DC

## 2016-03-28 MED ORDER — METOPROLOL TARTRATE 25 MG PO TABS: 12.5000 mg | ORAL_TABLET | Freq: Two times a day (BID) | ORAL | Status: DC

## 2016-03-28 NOTE — Telephone Encounter (Signed)
Order placed for [facility] PT/OT.

## 2016-03-28 NOTE — Patient Instructions (Addendum)
Medication Instructions:  Your physician has recommended you make the following change in your medication:  1-START Lopressor 12.5 mg by mouth twice daily 2-START Chantix as directed  Labwork: NONE  Testing/Procedures: NONE  Follow-Up: Your physician wants you to follow-up next available with Dr. Johnsie Cancel  Your physician recommends that you schedule a follow-up appointment as soon as possible (next week) with EP for Loop recorder due to PAF.    If you need a refill on your cardiac medications before your next appointment, please call your pharmacy.

## 2016-03-28 NOTE — Addendum Note (Signed)
Addended byOliver Hum on: 03/28/2016 04:05 PM   Modules accepted: Orders

## 2016-03-29 ENCOUNTER — Telehealth: Payer: Self-pay | Admitting: *Deleted

## 2016-03-29 NOTE — Telephone Encounter (Signed)
Having issues with HH since have not seen in the office.  I called and LMVM at stroke office at [place].

## 2016-03-29 NOTE — Telephone Encounter (Signed)
I spoke to [place], at [facility], they do not feel comfortable doing this.  Need a sooner appt for this pt.  [place] for NP to see?  Appt already scheduled 05/2016 with Dr. Jaynee Eagles.

## 2016-03-29 NOTE — Telephone Encounter (Signed)
I called and LMVM for stroke office to call re: orders for [facility] PT/OT.  Have not heard back.  I called wife and they have seen pa, jennifer couillard at [facility].  They have not used any [place] agency before.   I relayed that I placed order for referral to [facility] but require face to face encounter within the last 30 days.  I called [facility] and LM with jennifer to see if willing to do this.  Will let me know.

## 2016-03-29 NOTE — Telephone Encounter (Signed)
[name], let's discuss. thanks

## 2016-03-29 NOTE — Telephone Encounter (Signed)
Called and spoke to wife Jackelyn Poling. Advised PCP office not comfortable placing referrals. Advised that its ok per Dr Jaynee Eagles for them to see NP in office so that we can send referral. Scheduled pt with MM, NP on 04/05/16 check in 9am, for 930am appt.

## 2016-04-03 ENCOUNTER — Ambulatory Visit: Payer: BLUE CROSS/BLUE SHIELD | Admitting: Physician Assistant

## 2016-04-04 ENCOUNTER — Ambulatory Visit (HOSPITAL_COMMUNITY)
Admission: RE | Admit: 2016-04-04 | Payer: BLUE CROSS/BLUE SHIELD | Source: Ambulatory Visit | Admitting: Orthopedic Surgery

## 2016-04-04 ENCOUNTER — Encounter (HOSPITAL_COMMUNITY): Admission: RE | Payer: Self-pay | Source: Ambulatory Visit

## 2016-04-04 SURGERY — ANTERIOR CERVICAL DECOMPRESSION/DISCECTOMY FUSION 2 LEVELS
Anesthesia: General

## 2016-04-04 NOTE — Telephone Encounter (Signed)
Appt with MM/NP 04-05-16 0930.

## 2016-04-05 ENCOUNTER — Encounter: Payer: Self-pay | Admitting: Adult Health

## 2016-04-05 ENCOUNTER — Encounter (HOSPITAL_COMMUNITY)
Admission: RE | Disposition: A | Discharge status: Home / Self Care | Payer: Self-pay | Source: Ambulatory Visit | Attending: Internal Medicine

## 2016-04-05 ENCOUNTER — Ambulatory Visit (INDEPENDENT_AMBULATORY_CARE_PROVIDER_SITE_OTHER): Payer: BLUE CROSS/BLUE SHIELD | Admitting: Adult Health

## 2016-04-05 ENCOUNTER — Ambulatory Visit (HOSPITAL_COMMUNITY)
Admission: RE | Admit: 2016-04-05 | Discharge: 2016-04-05 | Disposition: A | Discharge status: Home / Self Care | Payer: BLUE CROSS/BLUE SHIELD | Source: Ambulatory Visit | Attending: Internal Medicine | Admitting: Internal Medicine

## 2016-04-05 VITALS — BP 168/72 | HR 78 | Resp 16 | Ht 68.0 in | Wt 152.0 lb

## 2016-04-05 DIAGNOSIS — Z951 Presence of aortocoronary bypass graft: Secondary | ICD-10-CM | POA: Diagnosis not present

## 2016-04-05 DIAGNOSIS — G4733 Obstructive sleep apnea (adult) (pediatric): Secondary | ICD-10-CM | POA: Diagnosis not present

## 2016-04-05 DIAGNOSIS — E785 Hyperlipidemia, unspecified: Secondary | ICD-10-CM | POA: Diagnosis not present

## 2016-04-05 DIAGNOSIS — Z7982 Long term (current) use of aspirin: Secondary | ICD-10-CM | POA: Insufficient documentation

## 2016-04-05 DIAGNOSIS — I2510 Atherosclerotic heart disease of native coronary artery without angina pectoris: Secondary | ICD-10-CM | POA: Insufficient documentation

## 2016-04-05 DIAGNOSIS — I69998 Other sequelae following unspecified cerebrovascular disease: Secondary | ICD-10-CM

## 2016-04-05 DIAGNOSIS — I10 Essential (primary) hypertension: Secondary | ICD-10-CM | POA: Diagnosis not present

## 2016-04-05 DIAGNOSIS — I639 Cerebral infarction, unspecified: Secondary | ICD-10-CM | POA: Diagnosis not present

## 2016-04-05 DIAGNOSIS — Z8673 Personal history of transient ischemic attack (TIA), and cerebral infarction without residual deficits: Secondary | ICD-10-CM | POA: Diagnosis not present

## 2016-04-05 DIAGNOSIS — F17210 Nicotine dependence, cigarettes, uncomplicated: Secondary | ICD-10-CM | POA: Insufficient documentation

## 2016-04-05 DIAGNOSIS — K219 Gastro-esophageal reflux disease without esophagitis: Secondary | ICD-10-CM | POA: Insufficient documentation

## 2016-04-05 DIAGNOSIS — I69398 Other sequelae of cerebral infarction: Secondary | ICD-10-CM

## 2016-04-05 DIAGNOSIS — R269 Unspecified abnormalities of gait and mobility: Secondary | ICD-10-CM | POA: Diagnosis not present

## 2016-04-05 DIAGNOSIS — Z955 Presence of coronary angioplasty implant and graft: Secondary | ICD-10-CM | POA: Insufficient documentation

## 2016-04-05 DIAGNOSIS — Z7902 Long term (current) use of antithrombotics/antiplatelets: Secondary | ICD-10-CM | POA: Diagnosis not present

## 2016-04-05 DIAGNOSIS — R42 Dizziness and giddiness: Secondary | ICD-10-CM

## 2016-04-05 HISTORY — PX: EP IMPLANTABLE DEVICE: SHX172B

## 2016-04-05 SURGERY — LOOP RECORDER INSERTION

## 2016-04-05 MED ORDER — LIDOCAINE-EPINEPHRINE 1 %-1:100000 IJ SOLN
INTRAMUSCULAR | Status: AC
  Filled 2016-04-05: qty 1

## 2016-04-05 MED ORDER — LIDOCAINE-EPINEPHRINE 1 %-1:100000 IJ SOLN
INTRAMUSCULAR | Status: DC | PRN
  Administered 2016-04-05: 10 mL

## 2016-04-05 SURGICAL SUPPLY — 2 items
LOOP REVEAL LINQSYS (Prosthesis & Implant Heart) ×2 IMPLANT
PACK LOOP INSERTION (CUSTOM PROCEDURE TRAY) ×2 IMPLANT

## 2016-04-05 NOTE — H&P (Signed)
60 y.o. male with history of CAD s/p bare metal stent to RCA in 2002 and 2008 and bare metal stent LAD in 2002 and 2008, ICM (EF ~ 40%), HTN, HL, OSA, GERD, tobacco abuse 2015 stress myoview with anterior and inferior wall scar with reduced LVEF, no clear ischemia. Cardiac cath done 12/22/13 with progressive disease and CABG recommended   CABG 01/01/14 with Dr Cyndia Bent This included LIMA to LAD and SVG to PDA EF 30-35% by echo  Has not smoked since surgery   Echo 05/27/14 EF 35-40%  MUGA 06/10/14 EF 43% 10/26/15 ABI's normal   Seein by Doctor Allred and felt to be not a candidate for CRT or AICD   03/15/16 Presenting with new onset vertigo and slurred speech as well as unstable gait. He is also had noticeable difficulty with swallowing. Onset of symptoms was at 5 PM on 03/15/2016. He has no previous history of stroke nor TIA. He's been taking aspirin daily. MRI of the brain showed acute left PICA distribution infarcts involving cerebellum and left lateral medulla. 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 There was mild focal  basal hypertrophy of the septum. Systolic function was moderately  reduced. The estimated ejection fraction was in the range of 35%  to 40%. Dyskinesis and aneurysmal deformity of the  mid-apicalanteroseptal, inferior, inferoseptal, and apical  myocardium. Doppler parameters are consistent with abnormal left  ventricular relaxation (grade 1 diastolic dysfunction). Acoustic  contrast opacification revealed no evidence ofthrombus. - Left atrium: The atrium was mildly dilated. - Atrial septum: No defect or patent foramen ovale was identified.  Impressions:  -  Although no LV apical thrombus is seen curremtly, the apical  dyskinesis appears to be a high risk finding for apical  thrombosis and embolic events.  Having trouble with balance and walking still Starting in house PT this week. Still smoking a bit   ROS: Denies fever, malais, weight loss, blurry vision, decreased visual acuity, cough, sputum, SOB, hemoptysis, pleuritic pain, palpitaitons, heartburn, abdominal pain, melena, lower extremity edema, claudication, or rash. All other systems reviewed and negative  General: Affect appropriate Healthy: appears stated age 60: normal Neck supple with no adenopathy JVP normal no bruits no thyromegaly Lungs clear with no wheezing and good diaphragmatic motion Heart: S1/S2 no murmur, no rub, gallop or click PMI normal Abdomen: benighn, BS positve, no tenderness, no AAA no bruit. No HSM or HJR Distal pulses intact with no bruits No edema Neuro non-focal Skin warm and dry No muscular weakness   Current Outpatient Prescriptions  Medication Sig Dispense Refill  . aspirin EC 81 MG EC tablet Take 1 tablet (81 mg total) by mouth daily. 30 tablet 0  . atorvastatin (LIPITOR) 40 MG tablet Take 1 tablet (40 mg total) by mouth daily at 6 PM. 30 tablet 00  . chlorproMAZINE (THORAZINE) 25 MG tablet Take 1 tablet (25 mg total) by mouth 3 (three) times daily. 30 tablet 0  . clopidogrel (PLAVIX) 75 MG tablet Take 1 tablet (75 mg total) by mouth daily. 30 tablet 0  . Multiple Vitamins-Minerals (MULTIVITAMIN WITH MINERALS) tablet Take 1 tablet by mouth daily.    . metoprolol tartrate (LOPRESSOR) 25 MG  tablet Take 0.5 tablets (12.5 mg total) by mouth 2 (two) times daily. 45 tablet 3  . varenicline (CHANTIX STARTING MONTH PAK) 0.5 MG X 11 & 1 MG X 42 tablet Take one 0.5 mg tablet by mouth once daily for 3 days, then increase to one 0.5 mg tablet twice daily for 4 days, then increase to one 1 mg tablet twice daily.  53 tablet 0   No current facility-administered medications for this visit.    Allergies  Hydrocodone  Electrocardiogram: 06/24/14 SR rate 62 anterolateral MI with persistent lateral T wave inversions QRS normal 07/07/15 SR ate 73 Anterolateral T wave changes persist   Assessment and Plan Cerebellar Stroke: PT important D/C smoking important will call chantix in . Will refer to EP for loop recorder Two different issue vertebral artery dissection and also ? Embolic events. Need to r/o PAF  CAD: Stable with no angina and good activity level. Add lopressor for better BP and HR control DCM euvolemic functional class one continue current meds EF 43% MUGA 2015  Chol:   Recent Labs    Lab Results  Component Value Date   LDLCALC 118* 03/17/2016     lasbs with Dr Tollie Pizza HTN: Well controlled. Continue current medications and low sodium Dash type diet.  Carpel Tunnel: Vs cervical spine disease with see Dr Rolena Infante to get MRI of c spine  Back Pain: F/u [facility] for injection. Swimming and doing PT F/u Dr Rolena Infante    PT/OT Lopressor 12.5 bid Chantix Refer EP for loop recorder        EP Attending  Patient seen and examined. Agree with above. He has a h/o cryptogenic stroke involving the posterior circulation. I have discussed the risks/benefits/goals/expectations of ILR insertion and he is willing to proceed.  Mikle Bosworth.D.

## 2016-04-05 NOTE — Progress Notes (Addendum)
PATIENT: Rayetta Humphrey DOB: June 30, 1956  REASON FOR VISIT: follow up- stroke follow-up HISTORY FROM: patient  HISTORY OF PRESENT ILLNESS: Mr. 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 He originally presented to the emergency room with new onset of vertigo, slurred speech and unstable gait. His wife noticed a slight left facial droop and mild sensory changes on the left side of the face and hand. The patient was not a candidate for tPA. MRA confirmed a left vertebral artery occlusion and acute left PICA  distribution infarcts affecting the left lateral medulla and areas of the left cerebellum. CTA indicated a left proximal V2 segment probable dissection and occlusion of the distal left V2 segment. The 2-D echocardiogram showed akinesis of the mid inferoseptal myocardium and akinesis of the apical myocardium. The patient's cholesterol LDL was 118- Lipitor was increased to 40 mg daily, hemoglobin A1c 5.1 %. The patient also has sleep apnea and is using the CPAP at home. The patient was started on aspirin and Plavix. The patient no longer smokes cigarettes. Patient states that since his discharge from the hospital he continues to have dizziness which affects his balance. He uses a walker when ambulating. Reports he leans to the left when ambulating. He also has dizziness  when he is sitting. He has had some falls but fortunately no injuries. The patient states that he's had a hard time with urination and constipation since his discharge from the hospital. He followed up with his primary care on May 16. They prescribed an enema for his constipation. He had 2 enemas on May 18 and finally had a bowel movement. He states he did not have another bowel movement  until 04/02/16 after taking magnesium citrate. The patient states that he is watching his diet. He is eating things such as raisins, applesauce, apple juice and  oatmeal. He states he also has a difficult time urinating. He states he is up every 1-2 hours at night trying to urinate however he urinates very little. He states during the day he does not have to go that often but when he does he is urinating very little. He is also discussed this with his primary care provider. The patient reports that he is sedentary at home due to ongoing dizziness.The patient states that his blood pressure is slightly elevated he was recently placed on a beta blocker. He has a follow-up appointment with his primary care June 13. He returns today for an evaluation.  REVIEW OF SYSTEMS: Out of a complete 14 system review of symptoms, the patient complains only of the following symptoms, and all other reviewed systems are negative.  Fatigue, trouble swallowing, light sensitivity, double vision, blurred vision, constipation, swollen abdomen, sleep talking, walking difficulty, frequency of urination, difficulty urinating, bruise/bleed easily, dizziness, weakness, tremors  ALLERGIES: Allergies  Allergen Reactions  . Hydrocodone     Throat itchy, weakness    HOME MEDICATIONS: Outpatient Prescriptions Prior to Visit  Medication Sig Dispense Refill  . aspirin EC 81 MG EC tablet Take 1 tablet (81 mg total) by mouth daily. 30 tablet 0  . atorvastatin (LIPITOR) 40 MG tablet Take 1 tablet (40 mg total) by mouth daily at 6 PM. 30 tablet 00  . clopidogrel (PLAVIX) 75 MG tablet Take 1 tablet (75 mg total) by mouth daily. 30 tablet 0  . metoprolol tartrate (LOPRESSOR) 25 MG tablet Take 0.5 tablets (12.5 mg total) by mouth 2 (two)  times daily. 45 tablet 3  . Multiple Vitamins-Minerals (MULTIVITAMIN WITH MINERALS) tablet Take 1 tablet by mouth daily.    . chlorproMAZINE (THORAZINE) 25 MG tablet Take 1 tablet (25 mg total) by mouth 3 (three) times daily. 30 tablet 0  . varenicline (CHANTIX STARTING MONTH PAK) 0.5 MG X 11 & 1 MG X 42 tablet Take one 0.5 mg tablet by mouth once daily for 3  days, then increase to one 0.5 mg tablet twice daily for 4 days, then increase to one 1 mg tablet twice daily. 53 tablet 0   No facility-administered medications prior to visit.    PAST MEDICAL HISTORY: Past Medical History  Diagnosis Date  . Mixed hyperlipidemia   . GERD (gastroesophageal reflux disease)   . Hypertension     under control with med., has been on med. > 12 yr.  . 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 Sleep apnea     uses CPAP nightly  . Dental bridge present     lower  . Ischemic cardiomyopathy     a. Echo 09/2012:  Mod LVH, focal basal hypertrophy, EF 30-35%, mid to dist inf-lat HK, Gr 1 diast dysfn, mild to mod LAE  . CAD (coronary artery disease)     a. s/p prior MI; hx of stent to RCA and LAD;  b. LHC 12/2006:  LM 20%, pLAD 40%, mLAD stent ok, apical LAD 90% (1.5-52mm vessel), oD1 40-50%, AV groove CFX 30%, oOM1 30%, pRCA 30% (multiple), mRCA stent ok with 30-40% ISR, dRCA 30%, dAnt, inf-apical severe HK, EF 40% => med Rx.;  12/2013 Cath: LM nl, LAD 95ost, 14m ISR, 99apex, LCX nl, RCA 40p, 100 ISR, L->R collats, EF 35-40%-->pending CABG  . Hx of cardiovascular stress test     a.  Myoview (02/2010): EF 41%, septal, apical and inf infarct, no ischemia;   b.  Lexiscan Myoview (12/02/13): Large fixed perfusion defect in the RCA and LAD territory, no ischemia, EF 40%.  . Tobacco abuse   . Myocardial infarction ([place])     x2  . Arthritis     PAST SURGICAL HISTORY: Past Surgical History  Procedure Laterality Date  . Knee arthroscopy      left knee x 1, right knee x 2  . Coronary angioplasty with stent placement  05/22/2001    PCI - RCA - LAD  . Coronary angioplasty with stent placement  11/11/2006    PCI - BMS - LAD  . Coronary angioplasty with stent placement  11/15/2006    PCI - BMS - RCA  . Coronary angioplasty with stent placement  03/16/2010  . Cardiac catheterization  06/27/2001; 12/13/2006; 02/20/2010  . Open reduction internal fixation (orif)  scaphoid with iliac crest bone graft  10/15/2012    Procedure: OPEN REDUCTION INTERNAL FIXATION (ORIF) SCAPHOID WITH ILIAC CREST BONE GRAFT;  Surgeon: Schuyler Amor, MD;  Location: [facility];  Service: Orthopedics;  Laterality: Right;  No iliac creast bone graft taken, used graft from radius  . Hand surgery Right     screws  . Foot surgery Right     achilles tendon reattached  . Coronary artery bypass graft N/A 01/01/2014    Procedure: Coronary artery bypass graft times two using left internal mammary artery and left leg greater saphenous vein harvested endoscopically.;  Surgeon: Gaye Pollack, MD;  Location: [facility] OR;  Service: Open Heart Surgery;  Laterality: N/A;  . Intraoperative transesophageal echocardiogram N/A 01/01/2014  Procedure: INTRAOPERATIVE TRANSESOPHAGEAL ECHOCARDIOGRAM;  Surgeon: Gaye Pollack, MD;  Location: [facility] OR;  Service: Open Heart Surgery;  Laterality: N/A;  . Left heart catheterization with coronary angiogram N/A 12/21/2013    Procedure: LEFT HEART CATHETERIZATION WITH CORONARY ANGIOGRAM;  Surgeon: Burnell Blanks, MD;  Location: [facility] CATH [facility];  Service: Cardiovascular;  Laterality: N/A;    FAMILY HISTORY: Family History  Problem Relation Age of Onset  . Heart disease Father   . Heart disease Maternal Grandmother   . Heart disease Maternal Grandfather   . Heart disease Paternal Grandmother   . Heart disease Paternal Grandfather     SOCIAL HISTORY: Social History   Social History  . Marital Status: Married    Spouse Name: N/A  . Number of Children: N/A  . Years of Education: N/A   Occupational History  . Not on file.   Social History Main Topics  . Smoking status: Former Smoker -- 1.00 packs/day for 20 years    Types: Cigarettes  . Smokeless tobacco: Never Used  . Alcohol Use: Yes     Comment: occasional  . Drug Use: No  . Sexual Activity: Not on file   Other Topics Concern  . Not on file   Social History Narrative       PHYSICAL EXAM  Filed Vitals:   04/05/16 0929  BP: 168/72  Pulse: 78  Resp: 16  Height: 5\' 8"  (1.727 m)  Weight: 152 lb (68.947 kg)   Body mass index is 23.12 kg/(m^2).  Generalized: Well developed, in no acute distress   Neurological examination  Mentation: Alert oriented to time, place, history taking. Follows all commands speech and language fluent Cranial nerve II-XII: Pupils were equal round reactive to light. Extraocular movements were full, visual field were full on confrontational test. Facial sensation and strength were normal. Uvula tongue midline. Head turning and shoulder shrug  were normal and symmetric. Motor: The motor testing reveals 5 over 5 strength of all 4 extremities. Good symmetric motor tone is noted throughout.  Sensory: Sensory testing is intact to soft touch on all 4 extremities. No evidence of extinction is noted.  Coordination: Cerebellar testing reveals good finger-nose-finger and heel-to-shin bilaterally.  Gait and station: Patient requires assistance when standing. He uses a walker when ambulating. He tends to veer to the left. Gait is slightly unsteady. Tandem gait not attempted. Reflexes: Deep tendon reflexes are symmetric and normal bilaterally.   DIAGNOSTIC DATA (LABS, IMAGING, TESTING) - I reviewed patient records, labs, notes, testing and imaging myself where available.  Lab Results  Component Value Date   WBC 10.0 03/16/2016   HGB 18.7* 03/16/2016   HCT 55.0* 03/16/2016   MCV 93.3 03/16/2016   PLT 171 03/16/2016      Component Value Date/Time   NA 140 03/16/2016 1012   K 4.0 03/16/2016 1012   CL 100* 03/16/2016 1012   CO2 28 01/12/2014 0748   GLUCOSE 111* 03/16/2016 1012   BUN 10 03/16/2016 1012   CREATININE 0.70 03/16/2016 1012   CALCIUM 9.0 01/12/2014 0748   PROT 7.1 12/30/2013 1018   ALBUMIN 3.8 12/30/2013 1018   AST 19 12/30/2013 1018   ALT 22 12/30/2013 1018   ALKPHOS 71 12/30/2013 1018   BILITOT 0.5 12/30/2013 1018    GFRNONAA >90 01/05/2014 0346   GFRAA >90 01/05/2014 0346   Lab Results  Component Value Date   CHOL 181 03/17/2016   HDL 47 03/17/2016   LDLCALC 118* 03/17/2016   TRIG 80  03/17/2016   CHOLHDL 3.9 03/17/2016   Lab Results  Component Value Date   HGBA1C 5.1 03/17/2016      ASSESSMENT AND PLAN 60 y.o. year old male  has a past medical history of Mixed hyperlipidemia; GERD (gastroesophageal reflux disease); Hypertension; Scaphoid non-union advanced collapse (10/2012); Sleep apnea; Dental bridge present; Ischemic cardiomyopathy; CAD (coronary artery disease); cardiovascular stress test; Tobacco abuse; Myocardial infarction ([place]); and Arthritis. here with:  1. History of stroke 2. Residual dizziness from stroke 3. Abnormality of gait   The patient will be referred for home health for physical and occupational therapy. The patient is unable to drive and due to his unsteadiness with his balance home therapy would be the most beneficial. The patient will continue to monitor his blood pressure. He should maintain strict control of his blood pressure with goal less than 130/90. Cholesterol LDL less than 100 and hemoglobin A1c less than 6.5%. Patient has a follow-up appointment in July with Dr. Jaynee Eagles. They would like to keep this appointment scheduled. Patient will follow-up as planned.   Ward Givens, MSN, NP-C 04/05/2016, 10:09 AM [facility] [address] [place], [place] [address] [phone]    Personally participated in and made any corrections needed to history, physical, neuro exam,assessment and plan as stated above, evaluated lab date, reviewed imaging studies and agree with radiology interpretations.    Sarina Ill, MD [facility]

## 2016-04-05 NOTE — Discharge Instructions (Signed)
See extra paper for aftercare instructions,

## 2016-04-05 NOTE — Patient Instructions (Signed)
HOME HEALTH PT  Continue aspirin and plavix Blood Pressure goal <130/90 Cholesterol LDL <100 HgbA1c <6.5%

## 2016-04-10 ENCOUNTER — Institutional Professional Consult (permissible substitution): Payer: BLUE CROSS/BLUE SHIELD | Admitting: Internal Medicine

## 2016-04-11 ENCOUNTER — Telehealth: Payer: Self-pay | Admitting: Cardiovascular Disease

## 2016-04-11 NOTE — Telephone Encounter (Signed)
Left message to call back  

## 2016-04-11 NOTE — Telephone Encounter (Signed)
New Message:  Abigail Butts is calling in to inform Dr. Johnsie Cancel that the pt was recently in the hospital and currently at high risk for readmittance. Abigail Butts would like to enroll the patient in case management. Please f/u with Abigail Butts as soon as possible.

## 2016-04-14 ENCOUNTER — Other Ambulatory Visit: Payer: Self-pay | Admitting: Cardiovascular Disease

## 2016-04-16 ENCOUNTER — Other Ambulatory Visit: Payer: Self-pay | Admitting: Cardiovascular Disease

## 2016-04-17 ENCOUNTER — Ambulatory Visit (INDEPENDENT_AMBULATORY_CARE_PROVIDER_SITE_OTHER): Payer: BLUE CROSS/BLUE SHIELD | Admitting: Cardiovascular Disease

## 2016-04-17 ENCOUNTER — Encounter: Payer: Self-pay | Admitting: Internal Medicine

## 2016-04-17 ENCOUNTER — Ambulatory Visit (INDEPENDENT_AMBULATORY_CARE_PROVIDER_SITE_OTHER): Payer: BLUE CROSS/BLUE SHIELD | Admitting: *Deleted

## 2016-04-17 ENCOUNTER — Encounter: Payer: Self-pay | Admitting: Cardiovascular Disease

## 2016-04-17 VITALS — BP 126/82 | HR 90 | Ht 68.0 in | Wt 155.0 lb

## 2016-04-17 DIAGNOSIS — I63212 Cerebral infarction due to unspecified occlusion or stenosis of left vertebral arteries: Secondary | ICD-10-CM | POA: Diagnosis not present

## 2016-04-17 DIAGNOSIS — I639 Cerebral infarction, unspecified: Secondary | ICD-10-CM | POA: Diagnosis not present

## 2016-04-17 LAB — CUP PACEART INCLINIC DEVICE CHECK: Date Time Interrogation Session: 20170613173153

## 2016-04-17 MED ORDER — ATORVASTATIN CALCIUM 40 MG PO TABS: 40.0000 mg | ORAL_TABLET | Freq: Every day | ORAL | Status: DC

## 2016-04-17 MED ORDER — LOSARTAN POTASSIUM 100 MG PO TABS: 100.0000 mg | ORAL_TABLET | Freq: Every day | ORAL | Status: DC

## 2016-04-17 MED ORDER — CARVEDILOL 3.125 MG PO TABS: 3.1250 mg | ORAL_TABLET | Freq: Every day | ORAL | Status: DC

## 2016-04-17 NOTE — Patient Instructions (Addendum)
Medication Instructions:  Your physician recommends that you continue on your current medications as directed. Please refer to the Current Medication list given to you today.  Labwork: NONE  Testing/Procedures: NONE  Follow-Up: Your physician wants you to follow-up in: 3 months with Dr. Nishan.   If you need a refill on your cardiac medications before your next appointment, please call your pharmacy.    

## 2016-04-17 NOTE — Progress Notes (Signed)
ILR wound check appointment. Steri-strips previously removed by patient. Wound without redness or edema. Incision edges approximated, wound well healed. Normal device function. Battery status: good. R-waves 0.28mV. No symptom, tachy, pause, or brady episodes. 1 AF episode--false, ECG shows SR, +ASA 81mg  and Plavix. Pause and brady detection reprogrammed off as patient was implanted for cryptogenic stroke. Monthly summary reports and ROV with GT PRN.

## 2016-04-17 NOTE — Telephone Encounter (Signed)
Patent was seen today in office.

## 2016-04-17 NOTE — Addendum Note (Signed)
Addended by: Aris Georgia, Jacque Byron L on: 04/17/2016 03:15 PM   Modules accepted: Orders

## 2016-04-17 NOTE — Progress Notes (Signed)
Patient ID: [name], male   DOB: 03/09/1956, 60 y.o.   MRN: [id number]   60 y.o. male with history of CAD s/p bare metal stent to RCA in 2002 and 2008 and bare metal stent LAD in 2002 and 2008, ICM (EF ~ 40%), HTN, HL, OSA, GERD, tobacco abuse 2015 stress myoview with anterior and inferior wall scar with reduced LVEF, no clear ischemia. Cardiac cath done 12/22/13 with progressive disease and CABG recommended   CABG 01/01/14  with Dr Cyndia Bent This included LIMA to LAD and SVG to PDA EF 30-35% by echo   Has not smoked since surgery   Echo 05/27/14 EF 35-40%  MUGA  06/10/14  EF 43% 10/26/15  ABI's normal    Seein by Doctor Allred and felt to be not a candidate for CRT or AICD   03/15/16  Presenting with new onset vertigo and slurred speech as well as unstable gait. He is also had noticeable difficulty with swallowing. Onset of symptoms was at 5 PM on 03/15/2016. He has no previous history of stroke nor TIA. He's been taking aspirin daily. MRI of the brain showed acute left PICA distribution infarcts involving cerebellum and left lateral medulla. 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 There was mild focal  basal hypertrophy of the septum. Systolic function was moderately  reduced. The estimated ejection fraction was in the range of 35%  to 40%. Dyskinesis and aneurysmal deformity of the  mid-apicalanteroseptal, inferior, inferoseptal, and apical  myocardium. Doppler parameters are consistent with abnormal left  ventricular relaxation (grade 1 diastolic dysfunction). Acoustic  contrast opacification revealed no evidence ofthrombus. - Left atrium: The atrium was mildly dilated. - Atrial  septum: No defect or patent foramen ovale was identified.  Impressions:  - Although no LV apical thrombus is seen curremtly, the apical  dyskinesis appears to be a high risk finding for apical  thrombosis and embolic events.  Having trouble with balance and walking still Starting in house PT this week.  Still smoking a bit  Referred to EP for ILR to r/o occult PAF Inserted by Dr Lovena Le 04/05/16    Reviewed device with Raquel Sarna  No alarms His home signal is weak and he may need a repeater box to transmit Wound healed well  Seeing Dr Lavell Anchors latter this month ? Needs to be on plavix for 3 months before any cervical Spine surgery    ROS: Denies fever, malais, weight loss, blurry vision, decreased visual acuity, cough, sputum, SOB, hemoptysis, pleuritic pain, palpitaitons, heartburn, abdominal pain, melena, lower extremity edema, claudication, or rash.  All other systems reviewed and negative  General: Affect appropriate Healthy:  appears stated age 60: normal Neck supple with no adenopathy JVP normal no bruits no thyromegaly Lungs clear with no wheezing and good diaphragmatic motion Heart:  S1/S2 no murmur, no rub, gallop or click PMI normal  ILR subQ left chest  Abdomen: benighn, BS positve, no tenderness, no AAA no bruit.  No HSM or HJR Distal pulses intact with no bruits No edema Neuro non-focal Skin warm and dry No muscular weakness   Current Outpatient Prescriptions  Medication Sig Dispense Refill  . aspirin EC 81 MG EC  tablet Take 1 tablet (81 mg total) by mouth daily. 30 tablet 0  . atorvastatin (LIPITOR) 40 MG tablet Take 1 tablet (40 mg total) by mouth daily at 6 PM. 30 tablet 00  . carvedilol (COREG) 3.125 MG tablet Take 3.125 mg by mouth daily.    . clopidogrel (PLAVIX) 75 MG tablet Take 1 tablet (75 mg total) by mouth daily. 30 tablet 0  . losartan (COZAAR) 100 MG tablet Take 100 mg by mouth daily.    . metoprolol tartrate (LOPRESSOR) 25 MG tablet Take 0.5  tablets (12.5 mg total) by mouth 2 (two) times daily. 45 tablet 3  . Multiple Vitamins-Minerals (MULTIVITAMIN WITH MINERALS) tablet Take 1 tablet by mouth daily.     No current facility-administered medications for this visit.    Allergies  Hydrocodone  Electrocardiogram:  06/24/14  SR rate 62 anterolateral MI with persistent lateral T wave inversions  QRS normal    07/07/15 SR ate 73  Anterolateral T wave changes persist   Assessment and Plan Cerebellar Stroke:   PT important D/C smoking important will call chantix in .  Loop recorder inserted  To r/o PAF but also had vertebral artery dissection  ILR no alarms reviewed one false afib recording was sinus He will check signal strength for box at home to see if he needs repeater to tansmit  CAD: Stable with no angina and good activity level.  Add lopressor for better BP and HR control DCM  euvolemic functional class one continue current meds EF 43% MUGA 2015  Chol:  Lab Results  Component Value Date   LDLCALC 118* 03/17/2016   lasbs with Dr Tollie Pizza HTN: Well controlled.  Continue current medications and low sodium Dash type diet.   Carpel Tunnel:  Vs cervical spine disease with see Dr Rolena Infante to get MRI of c spine   Ok to stop plavix in 3 months  Back Pain:  F/u [facility] for injection.   Doing PT  F/u Dr Arlan Organ

## 2016-04-18 ENCOUNTER — Telehealth: Payer: Self-pay | Admitting: Cardiology

## 2016-04-18 NOTE — Telephone Encounter (Signed)
Pt girlfriend called and stated that they have tried moving home monitor around the bedroom and are still unable to get cellular signal. Informed her that device tech rn will order adapter for Internet. Pt girlfriend verbalized understanding .

## 2016-04-18 NOTE — Telephone Encounter (Signed)
Called [facility] tech services to order a land line adapter for patient's monitor.  Internet adapter is not currently available for [address] monitor.  Land line monitor ordered, should arrive within 3 weeks per Tamela Gammon, tech services rep.  Called patient to make him aware that he will be receiving a land line adapter instead of an internet adapter.  Patient verbalizes understanding and is aware that this adapter will have to be plugged in to a phone jack.  He is appreciative and denies questions or concerns at this time.

## 2016-04-26 ENCOUNTER — Telehealth: Payer: Self-pay | Admitting: Neurology

## 2016-04-26 NOTE — Telephone Encounter (Signed)
Benjamine Mola [facility] [phone] called sts pt did have a fall last week but did not get injured. She is requesting 4 additional home health PT visits. She needs a verbal.

## 2016-04-26 NOTE — Telephone Encounter (Signed)
LVM and gave verbal order to continue [facility] PT visits x 4 per Dr Leta Baptist. Left name, number for any questions.

## 2016-05-02 ENCOUNTER — Encounter: Payer: Self-pay | Admitting: *Deleted

## 2016-05-02 NOTE — Progress Notes (Signed)
Faxed signed orders for PT 2W 1 back to [facility]. [phone]. Received confirmation.

## 2016-05-07 ENCOUNTER — Ambulatory Visit (INDEPENDENT_AMBULATORY_CARE_PROVIDER_SITE_OTHER): Payer: BLUE CROSS/BLUE SHIELD | Admitting: *Deleted

## 2016-05-07 DIAGNOSIS — I639 Cerebral infarction, unspecified: Secondary | ICD-10-CM | POA: Diagnosis not present

## 2016-05-07 NOTE — Progress Notes (Signed)
[facility] Summary Report / Loop Recorder 

## 2016-05-15 ENCOUNTER — Ambulatory Visit (INDEPENDENT_AMBULATORY_CARE_PROVIDER_SITE_OTHER): Payer: BLUE CROSS/BLUE SHIELD | Admitting: Neurology

## 2016-05-15 ENCOUNTER — Encounter: Payer: Self-pay | Admitting: Neurology

## 2016-05-15 VITALS — BP 171/95 | HR 67 | Ht 68.0 in | Wt 164.5 lb

## 2016-05-15 DIAGNOSIS — G4733 Obstructive sleep apnea (adult) (pediatric): Secondary | ICD-10-CM

## 2016-05-15 DIAGNOSIS — I7774 Dissection of vertebral artery: Secondary | ICD-10-CM | POA: Diagnosis not present

## 2016-05-15 DIAGNOSIS — W19XXXD Unspecified fall, subsequent encounter: Secondary | ICD-10-CM

## 2016-05-15 DIAGNOSIS — I663 Occlusion and stenosis of cerebellar arteries: Secondary | ICD-10-CM

## 2016-05-15 DIAGNOSIS — R0683 Snoring: Secondary | ICD-10-CM | POA: Diagnosis not present

## 2016-05-15 DIAGNOSIS — M7989 Other specified soft tissue disorders: Secondary | ICD-10-CM | POA: Diagnosis not present

## 2016-05-15 DIAGNOSIS — G4710 Hypersomnia, unspecified: Secondary | ICD-10-CM

## 2016-05-15 DIAGNOSIS — R400 Somnolence: Secondary | ICD-10-CM

## 2016-05-15 NOTE — Progress Notes (Signed)
[id number]    Provider:  Dr Jaynee Eagles Referring Provider: Stephens Shire, MD Primary Care Physician:  Stephens Shire, MD  CC:  2 month follow up stroke 03/17/2016  HPI:  [name] is a 60 y.o. male here as a referral from Dr. Tollie Pizza for left vertebral artery occlusion. Acute left PICA distribution infarction affecting the left lateral medulla and areas of the left cerebellum. [name] is an 60 y.o. male history of hypertension, hyperlipidemia, coronary artery disease and arthritis, presented with new onset vertigo and slurred speech as well as unstable gait. He is also had noticeable difficulty with swallowing. Onset of symptoms was at 5 PM on 03/15/2016. He had no previous history of stroke nor TIA. He was taking aspirin daily. MRI of the brain showed acute left PICA distribution infarcts involving cerebellum and left lateral medulla. 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 His fiance is noted slight droop of the face on the left vision is noted mild sensory changes involving the left side of his face and left hand. NIH stroke score was 3.  He returns today for follow up with his fiance. He has participated in home PT. He had a loop recorder placed. He finished physical therapy. He has had 2 falls because he was not using the walker. He was scheduled for surgery with Dr, Rolena Infante. He is on Asa 81mg  and Plavix. In early August will stop dual anti-platelet and just stay on Plavix. He is still dizzy and walks to the left. Smells heavily of smoke today but says that is his fiance. No vertigo. No slurred speech. Vision is blurry in the left eye and wants to water. No problems swallowing. Left face is numb. He has swelling in the feet. He was scheduled for cervical spine surgery when this happened. He has severe OSA and hasn't had a sleep test in 15 years and hasn;t used the cpap in at least that long  Reviewed notes, labs and imaging from outside physicians, which  showed: Ct Head Wo Contrast 03/16/2016  Mild right ethmoid sinus disease. No intracranial mass hemorrhage, or focal gray - white compartment lesions/ acute appearing infarct.   [name] Head Wo Contrast 03/16/2016  Left vertebral artery occlusion. Acute left PICA distribution infarction affecting the left lateral medulla and areas of the left cerebellum. No swelling or hemorrhage. The remainder the brain is normal. Normal MR venography.     Mr Mrv Head Wo Cm 03/16/2016  Left vertebral artery occlusion. Acute left PICA distribution infarction affecting the left lateral medulla and areas of the left cerebellum. No swelling or hemorrhage. The remainder the brain is normal. Normal MR venography.  IMPRESSION: CT HEAD: 2 small LEFT cerebellar infarcts without further propagation. Otherwise negative CT HEAD with and without contrast.  CTA NECK: LEFT proximal V2 segment probable dissection, and occlusion of distal LEFT V2 segment.  Patulous esophagus with air-fluid level, aspiration risk.  Severe bilateral C5-6 neural foraminal narrowing.  CTA HEAD: Occluded LEFT vertebral artery and LEFT posterior inferior cerebellar artery. Minimal retrograde flow into distal LEFT V4 segment.  Per cardiology notes: 60 y.o. male with history of CAD s/p bare metal stent to RCA in 2002 and 2008 and bare metal stent LAD in 2002 and 2008, ICM (EF ~ 40%), HTN, HL, OSA, GERD, tobacco abuse 2015 stress myoview with anterior and inferior wall scar with reduced LVEF, no clear ischemia. Cardiac cath done 12/22/13 with progressive disease and CABG recommended   CABG 01/01/14 with Dr Cyndia Bent  This included LIMA to LAD and SVG to PDA EF 30-35% by echo  Has not smoked since surgery   Echo 05/27/14 EF 35-40%  MUGA 06/10/14 EF 43% 10/26/15 ABI's normal    Review of Systems: Patient complains of symptoms per HPI as well as the following symptoms: Frequency of urination, urgency, back pain, walking  difficulty, bruise bleed easily, dizziness, numbness, weakness, tremors, leg swelling . Pertinent negatives per HPI. All others negative.   Social History   Social History  . Marital Status: Married    Spouse Name: [name]  . Number of Children: 2  . Years of Education: 12   Occupational History  . Not on file.   Social History Main Topics  . Smoking status: Former Smoker -- 1.00 packs/day for 20 years    Types: Cigarettes    Quit date: 03/16/2016  . Smokeless tobacco: Never Used  . Alcohol Use: 0.0 oz/week    0 Standard drinks or equivalent per week     Comment: occasional  . Drug Use: No  . Sexual Activity: Not on file   Other Topics Concern  . Not on file   Social History Narrative   Lives with [name]   Caffeine use: Drinks coffee/tea/soda (2 cups coffee/morning) (2 sodas per day or tea)    Family History  Problem Relation Age of Onset  . Heart disease Father   . Heart disease Maternal Grandmother   . Heart disease Maternal Grandfather   . Heart disease Paternal Grandmother   . Heart disease Paternal Grandfather     Past Medical History  Diagnosis Date  . Mixed hyperlipidemia   . GERD (gastroesophageal reflux disease)   . Hypertension     under control with med., has been on med. > 12 yr.  . 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 Sleep apnea     uses CPAP nightly  . Dental bridge present     lower  . Ischemic cardiomyopathy     a. Echo 09/2012:  Mod LVH, focal basal hypertrophy, EF 30-35%, mid to dist inf-lat HK, Gr 1 diast dysfn, mild to mod LAE  . CAD (coronary artery disease)     a. s/p prior MI; hx of stent to RCA and LAD;  b. LHC 12/2006:  LM 20%, pLAD 40%, mLAD stent ok, apical LAD 90% (1.5-35mm vessel), oD1 40-50%, AV groove CFX 30%, oOM1 30%, pRCA 30% (multiple), mRCA stent ok with 30-40% ISR, dRCA 30%, dAnt, inf-apical severe HK, EF 40% => med Rx.;  12/2013 Cath: LM nl, LAD 95ost, 45m ISR, 99apex, LCX nl, RCA 40p, 100 ISR, L->R  collats, EF 35-40%-->pending CABG  . Hx of cardiovascular stress test     a.  Myoview (02/2010): EF 41%, septal, apical and inf infarct, no ischemia;   b.  Lexiscan Myoview (12/02/13): Large fixed perfusion defect in the RCA and LAD territory, no ischemia, EF 40%.  . Tobacco abuse   . Myocardial infarction ([place])     x2  . Arthritis     Past Surgical History  Procedure Laterality Date  . Knee arthroscopy      left knee x 1, right knee x 2  . Coronary angioplasty with stent placement  05/22/2001    PCI - RCA - LAD  . Coronary angioplasty with stent placement  11/11/2006    PCI - BMS - LAD  . Coronary angioplasty with stent placement  11/15/2006    PCI - BMS - RCA  .  Coronary angioplasty with stent placement  03/16/2010  . Cardiac catheterization  06/27/2001; 12/13/2006; 02/20/2010  . Open reduction internal fixation (orif) scaphoid with iliac crest bone graft  10/15/2012    Procedure: OPEN REDUCTION INTERNAL FIXATION (ORIF) SCAPHOID WITH ILIAC CREST BONE GRAFT;  Surgeon: Schuyler Amor, MD;  Location: [facility];  Service: Orthopedics;  Laterality: Right;  No iliac creast bone graft taken, used graft from radius  . Hand surgery Right     screws  . Foot surgery Right     achilles tendon reattached  . Coronary artery bypass graft N/A 01/01/2014    Procedure: Coronary artery bypass graft times two using left internal mammary artery and left leg greater saphenous vein harvested endoscopically.;  Surgeon: Gaye Pollack, MD;  Location: [facility] OR;  Service: Open Heart Surgery;  Laterality: N/A;  . Intraoperative transesophageal echocardiogram N/A 01/01/2014    Procedure: INTRAOPERATIVE TRANSESOPHAGEAL ECHOCARDIOGRAM;  Surgeon: Gaye Pollack, MD;  Location: [facility] OR;  Service: Open Heart Surgery;  Laterality: N/A;  . Left heart catheterization with coronary angiogram N/A 12/21/2013    Procedure: LEFT HEART CATHETERIZATION WITH CORONARY ANGIOGRAM;  Surgeon: Burnell Blanks, MD;   Location: [facility] CATH LAB;  Service: Cardiovascular;  Laterality: N/A;  . Ep implantable device N/A 04/05/2016    Procedure: Loop Recorder Insertion;  Surgeon: Evans Lance, MD;  Location: [facility];  Service: Cardiovascular;  Laterality: N/A;    Current Outpatient Prescriptions  Medication Sig Dispense Refill  . aspirin 81 MG EC tablet TAKE 1 TABLET BY MOUTH EVERY DAY 30 tablet 11  . atorvastatin (LIPITOR) 40 MG tablet Take 1 tablet (40 mg total) by mouth daily at 6 PM. 90 tablet 3  . carvedilol (COREG) 3.125 MG tablet Take 1 tablet (3.125 mg total) by mouth daily. 90 tablet 3  . clopidogrel (PLAVIX) 75 MG tablet TAKE 1 TABLET BY MOUTH EVERY DAY 30 tablet 11  . losartan (COZAAR) 100 MG tablet Take 1 tablet (100 mg total) by mouth daily. 90 tablet 3  . metoprolol tartrate (LOPRESSOR) 25 MG tablet Take 0.5 tablets (12.5 mg total) by mouth 2 (two) times daily. 45 tablet 3  . Multiple Vitamins-Minerals (MULTIVITAMIN WITH MINERALS) tablet Take 1 tablet by mouth daily.     No current facility-administered medications for this visit.    Allergies as of 05/15/2016 - Review Complete 05/15/2016  Allergen Reaction Noted  . Hydrocodone  03/16/2016    Vitals: BP 171/95 mmHg  Pulse 67  Ht 5\' 8"  (1.727 m)  Wt 164 lb 8 oz (74.617 kg)  BMI 25.02 kg/m2 Last Weight:  Wt Readings from Last 1 Encounters:  05/15/16 164 lb 8 oz (74.617 kg)   Last Height:   Ht Readings from Last 1 Encounters:  05/15/16 5\' 8"  (1.727 m)    Exam: Gen: NAD, conversant, well nourised, obese, well groomed  CV: RRR, no MRG. No Carotid Bruits. Mild peripheral edema, warm, nontender Eyes: Conjunctivae clear without exudates or hemorrhage  Neuro: Detailed Neurologic Exam  Speech:  Speech is normal; fluent and spontaneous with normal comprehension.  Cognition:  The patient is oriented to person, place, and time;  Cranial Nerves:  The pupils are round, and reactive to light, left  miosis. The fundi are normal and spontaneous venous pulsations are present. Visual fields are full to finger confrontation. Extraocular movements are intact. Trigeminal sensation is decreased in the left lower face intact and the muscles of mastication are normal. Mild left NL  flattening otherwise the face is symmetric. The palate elevates in the midline. Hearing intact. Voice is normal. Shoulder shrug is normal. The tongue has normal motion without fasciculations.   Coordination:  Very mild dysmetria LUE, LLE intact  Motor Observation:  No asymmetry, no atrophy, and no involuntary movements noted. Tone:  Normal muscle tone.   Strength: Left leg hip flexion 4/5 otherwise strength is V/V in the upper and lower limbs.    Sensation: hemisensory loss.   Reflex Exam:     Assessment/Plan:  ASSESSMENT/PLAN [name] is a 60 y.o. male with history of hyperlipidemia, hypertension, obstructive sleep apnea, ischemic cardiomyopathy, coronary artery disease with previous MI, and history of tobacco use presenting with new-onset vertigo, slurred speech, left facial droop, mild sensory changes of the left face and hand, unsteady gait, and difficulty swallowing. He did not receive IV t-PA due to late presentation.  Stroke: Dominant infarcts possibly embolic from an unknown source.  Resultant Dizziness, left dysmetria, improving  MRI - Acute left PICA distribution infarction affecting the left lateral medulla and areas of the left cerebellum.  MRA - Left vertebral artery occlusion.   CTA of head and neck - LEFT proximal V2 segment probable dissection, and  occlusion of distal LEFT V2 segment   Loop recorder inserted   Carotid Doppler - refer to CTA of the neck.  2D Echo follows with cardiology   LDL 118 - continue statin  HgbA1c 5.1   Patient counseled to be compliant with his antithrombotic medications  Ongoing aggressive stroke risk factor  management  Hypertension  Controlled, follows with Dr. Johnsie Cancel  Hyperlipidemia    Lipitor 40mg , continue  Other Stroke Risk Factors  Advanced age  Cigarette smoker, current user, encouraged cessation  ETOH use  Coronary artery disease: Follows with Dr. Johnsie Cancel  Obstructive sleep apnea, not using his cpap, refer to sleep studies  Other Active Problems  Polycythemia  UDS positive for THCU   PLAN  Needs Plavix and Asa 81mg  for 3 months then plavix alone  Repeat CTA head and neck  Sleep study, not compliant with cpap and hasn;t had a sleep eval in 10+ years  outpatient physical therapy if insurance will pay (just had at-home PT)  follo wup 4-6 months  Discussed cervical   Cervical spine needs surgery:  Explained risks vs benefit. While he is off of his plavix, he is at higher risk for stroke. It is at Dr. Valla Leaver discretion if patient can stay on a baby aspirin while off of his plavix which will provide some protection but may also increase the risk of bleeding during surgery. If he needs to have the surgery he understands that he is at elevated risk for stroke especially while off of her plavix. Patient and fiance acknowledge risks. Dr. Rolena Infante is an excellent surgeon, he will be in good hands.   CC: Dr. Tollie Pizza, Dr. Sedalia Muta, MD  [facility] [address] [place], [place] [address]  Phone [phone] Fax [phone]  A total of 40 minutes was spent face-to-face with this patient. Over half this time was spent on counseling patient on the left PICA distribution infarction affecting the left lateral medulla and areas of the left cerebellum.   diagnosis and different diagnostic and therapeutic options available.

## 2016-05-15 NOTE — Patient Instructions (Addendum)
Remember to drink plenty of fluid, eat healthy meals and do not skip any meals. Try to eat protein with a every meal and eat a healthy snack such as fruit or nuts in between meals. Try to keep a regular sleep-wake schedule and try to exercise daily, particularly in the form of walking, 20-30 minutes a day, if you can.   As far as your medications are concerned, I would like to suggest: Continue ASA and Plavix until August 13th and then plavix alone. Will send my note to Dr. Rolena Infante.   As far as diagnostic testing: repeat CTA of the head and neck in August, sleep evaluation  I would like to see you back in 4 months, sooner if we need to. Please call us with any interim questions, concerns, problems, updates or refill requests.   Our phone number is [phone]. We also have an after hours call service for urgent matters and there is a physician on-call for urgent questions. For any emergencies you know to call 911 or go to the nearest emergency room

## 2016-05-16 ENCOUNTER — Ambulatory Visit: Payer: Self-pay | Admitting: Neurology

## 2016-05-16 ENCOUNTER — Telehealth: Payer: Self-pay | Admitting: *Deleted

## 2016-05-16 LAB — BASIC METABOLIC PANEL
BUN/Creatinine Ratio: 17 (ref 10–24)
BUN: 13 mg/dL (ref 8–27)
CO2: 26 mmol/L (ref 18–29)
Calcium: 9.7 mg/dL (ref 8.6–10.2)
Chloride: 106 mmol/L (ref 96–106)
Creatinine, Ser: 0.76 mg/dL (ref 0.76–1.27)
GFR calc Af Amer: 115 mL/min/{1.73_m2} (ref >59)
GFR calc non Af Amer: 99 mL/min/{1.73_m2} (ref >59)
Glucose: 83 mg/dL (ref 65–99)
Potassium: 4.8 mmol/L (ref 3.5–5.2)
Sodium: 146 mmol/L — ABNORMAL HIGH (ref 134–144)

## 2016-05-16 NOTE — Telephone Encounter (Signed)
Called and LVM about lab results. Advised they look fine per Dr Jaynee Eagles. Gave [facility] phone number if he has further questions.

## 2016-05-16 NOTE — Telephone Encounter (Signed)
-----   Message from Melvenia Beam, MD sent at 05/16/2016  8:07 AM EDT ----- Labs fine thanks

## 2016-05-21 ENCOUNTER — Encounter: Payer: Self-pay | Admitting: Cardiovascular Disease

## 2016-05-22 ENCOUNTER — Ambulatory Visit (INDEPENDENT_AMBULATORY_CARE_PROVIDER_SITE_OTHER): Payer: BLUE CROSS/BLUE SHIELD | Admitting: Neurology

## 2016-05-22 ENCOUNTER — Encounter: Payer: Self-pay | Admitting: Neurology

## 2016-05-22 VITALS — BP 144/70 | HR 78 | Resp 16 | Ht 68.0 in | Wt 166.0 lb

## 2016-05-22 DIAGNOSIS — G4710 Hypersomnia, unspecified: Secondary | ICD-10-CM | POA: Diagnosis not present

## 2016-05-22 DIAGNOSIS — G4733 Obstructive sleep apnea (adult) (pediatric): Secondary | ICD-10-CM

## 2016-05-22 DIAGNOSIS — R351 Nocturia: Secondary | ICD-10-CM | POA: Diagnosis not present

## 2016-05-22 DIAGNOSIS — I639 Cerebral infarction, unspecified: Secondary | ICD-10-CM | POA: Diagnosis not present

## 2016-05-22 LAB — CUP PACEART REMOTE DEVICE CHECK: Date Time Interrogation Session: 20170701170558

## 2016-05-22 NOTE — Patient Instructions (Signed)
Based on your symptoms and your exam I believe you are still at risk for obstructive sleep apnea or OSA, and I think we should proceed with a sleep study to determine whether you do or do not have OSA and how severe it is. If you have more than mild OSA, I want you to consider treatment with CPAP, especially, in light of your stroke history. Please remember, the risks and ramifications of moderate to severe obstructive sleep apnea or OSA are: Cardiovascular disease, including congestive heart failure, stroke, difficult to control hypertension, arrhythmias, and even type 2 diabetes has been linked to untreated OSA. Sleep apnea causes disruption of sleep and sleep deprivation in most cases, which, in turn, can cause recurrent headaches, problems with memory, mood, concentration, focus, and vigilance. Most people with untreated sleep apnea report excessive daytime sleepiness, which can affect their ability to drive. Please do not drive if you feel sleepy.   I will likely see you back after your sleep study to go over the test results and where to go from there. We will call you after your sleep study to advise about the results (most likely, you will hear from Beverlee Nims, my nurse) and to set up an appointment at the time, as necessary.    Our sleep lab administrative assistant, Arrie Aran will meet with you or call you to schedule your sleep study. If you don't hear back from her by next week please feel free to call her at [phone]. This is her direct line and please leave a message with your phone number to call back if you get the voicemail box. She will call back as soon as possible.
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
## 2016-05-28 ENCOUNTER — Telehealth: Payer: Self-pay | Admitting: Cardiovascular Disease

## 2016-05-28 NOTE — Telephone Encounter (Signed)
Walk In pt Form-[facility]-Clearance dropped off gave to [name] I.

## 2016-05-31 ENCOUNTER — Telehealth: Payer: Self-pay | Admitting: Cardiovascular Disease

## 2016-05-31 NOTE — Telephone Encounter (Signed)
Dr. Johnsie Cancel checked the box that states  Patient is cleared for surgery with the following recommendations of -"Cardiac perspective, see neurology note form 05/15/16 regarding being off DAPT"   Will fax neurology note and this message to [facility] at [facility] orthopedic.

## 2016-05-31 NOTE — Telephone Encounter (Signed)
New message    She is calling about the medical clearance.  She can't understand what the Dr wrote. Please clarify.

## 2016-05-31 NOTE — Telephone Encounter (Signed)
Dr. Johnsie Cancel signed clearance. Clearance was faxed on 05/29/16. It is also scanned into patient's chart and can be reviewed in Media tab.

## 2016-06-04 ENCOUNTER — Ambulatory Visit (INDEPENDENT_AMBULATORY_CARE_PROVIDER_SITE_OTHER): Payer: BLUE CROSS/BLUE SHIELD | Admitting: *Deleted

## 2016-06-04 ENCOUNTER — Ambulatory Visit: Payer: Self-pay | Admitting: Physician Assistant

## 2016-06-04 DIAGNOSIS — I63212 Cerebral infarction due to unspecified occlusion or stenosis of left vertebral arteries: Secondary | ICD-10-CM | POA: Diagnosis not present

## 2016-06-04 LAB — CUP PACEART REMOTE DEVICE CHECK: Date Time Interrogation Session: 20170731170643

## 2016-06-04 NOTE — Progress Notes (Signed)
[facility] Summary Report / Loop Recorder 

## 2016-06-07 ENCOUNTER — Ambulatory Visit (INDEPENDENT_AMBULATORY_CARE_PROVIDER_SITE_OTHER): Payer: PPO | Admitting: Neurology

## 2016-06-07 DIAGNOSIS — R0602 Shortness of breath: Secondary | ICD-10-CM | POA: Diagnosis not present

## 2016-06-07 DIAGNOSIS — G4733 Obstructive sleep apnea (adult) (pediatric): Secondary | ICD-10-CM | POA: Diagnosis not present

## 2016-06-07 DIAGNOSIS — R05 Cough: Secondary | ICD-10-CM | POA: Diagnosis not present

## 2016-06-07 DIAGNOSIS — G4720 Circadian rhythm sleep disorder, unspecified type: Secondary | ICD-10-CM

## 2016-06-07 DIAGNOSIS — J22 Unspecified acute lower respiratory infection: Secondary | ICD-10-CM | POA: Diagnosis not present

## 2016-06-08 ENCOUNTER — Ambulatory Visit: Payer: Self-pay | Admitting: Physician Assistant

## 2016-06-08 ENCOUNTER — Other Ambulatory Visit (HOSPITAL_COMMUNITY): Payer: Self-pay | Admitting: *Deleted

## 2016-06-08 ENCOUNTER — Encounter (HOSPITAL_COMMUNITY): Payer: Self-pay

## 2016-06-08 DIAGNOSIS — M50021 Cervical disc disorder at C4-C5 level with myelopathy: Secondary | ICD-10-CM | POA: Diagnosis not present

## 2016-06-08 NOTE — Pre-Procedure Instructions (Addendum)
[name]  06/08/2016      [facility] - [place], [place] [address] Phone: [phone] Fax: [phone]  [id number] - [place], [place] - [address] [place] [address] Phone: [phone] Fax: [phone]    Your procedure is scheduled on Thursday, June 14, 2016   Report to [facility] Entrance "A" Admitting Office at 7:00 AM.   Call this number if you have problems the morning of surgery: [phone]   Any questions prior to day of surgery, please call [phone] between 8 & 4 PM.   Remember:  Do not eat food or drink liquids after midnight Wednesday, 06/13/16.  Take these medicines the morning of surgery with A SIP OF WATER: Carvedilol (Coreg), Metoprolol (Lopressor)  Stop Aspirin and Plavix as instructed by physician. Stop all vitamins as of today. Do not use Aspirin products and NSAIDS (Ibuprofen, Aleve, etc.) prior to surgery.   Do not wear jewelry.  Do not wear lotions, powders, or cologne.  You may wear deoderant.  Men may shave face and neck.  Do not bring valuables to the hospital.  [facility] is not responsible for any belongings or valuables.  Contacts, dentures or bridgework may not be worn into surgery.  Leave your suitcase in the car.  After surgery it may be brought to your room.  For patients admitted to the hospital, discharge time will be determined by your treatment team.  Patients discharged the day of surgery will not be allowed to drive home.   Special instructions:  [facility] - Preparing for Surgery  Before surgery, you can play an important role.  Because skin is not sterile, your skin needs to be as free of germs as possible.  You can reduce the number of germs on you skin by washing with CHG (chlorahexidine gluconate) soap before surgery.  CHG is an antiseptic cleaner which kills germs  and bonds with the skin to continue killing germs even after washing.  Please DO NOT use if you have an allergy to CHG or antibacterial soaps.  If your skin becomes reddened/irritated stop using the CHG and inform your nurse when you arrive at Short Stay.  Do not shave (including legs and underarms) for at least 48 hours prior to the first CHG shower.  You may shave your face.  Please follow these instructions carefully:   1.  Shower with CHG Soap the night before surgery and the                                morning of Surgery.  2.  If you choose to wash your hair, wash your hair first as usual with your       normal shampoo.  3.  After you shampoo, rinse your hair and body thoroughly to remove the shampoo.  4.  Use CHG as you would any other liquid soap.  You can apply chg directly       to the skin and wash gently with scrungie or a clean washcloth.  5.  Apply the CHG Soap to your body ONLY FROM THE NECK DOWN.        Do not use on open wounds or open sores.  Avoid contact with your eyes, ears, mouth and genitals (private parts).  Wash genitals (private parts) with your normal soap.  6.  Wash thoroughly, paying special attention to the area where your surgery        will be performed.  7.  Thoroughly rinse your body with warm water from the neck down.  8.  DO NOT shower/wash with your normal soap after using and rinsing off       the CHG Soap.  9.  Pat yourself dry with a clean towel.            10.  Wear clean pajamas.            11.  Place clean sheets on your bed the night of your first shower and do not        sleep with pets.  Day of Surgery  Do not apply any lotions the morning of surgery.  Please wear clean clothes to the hospital.   Please read over the following fact sheets that you were given. Pain Booklet, Coughing and Deep Breathing, MRSA Information and Surgical Site Infection Prevention

## 2016-06-11 ENCOUNTER — Encounter (HOSPITAL_COMMUNITY): Payer: Self-pay

## 2016-06-11 ENCOUNTER — Encounter (HOSPITAL_COMMUNITY)
Admission: RE | Admit: 2016-06-11 | Discharge: 2016-06-11 | Disposition: A | Discharge status: Home / Self Care | Payer: PPO | Source: Ambulatory Visit | Attending: Orthopedic Surgery | Admitting: Orthopedic Surgery

## 2016-06-11 ENCOUNTER — Telehealth: Payer: Self-pay | Admitting: Cardiovascular Disease

## 2016-06-11 DIAGNOSIS — M4802 Spinal stenosis, cervical region: Secondary | ICD-10-CM | POA: Diagnosis not present

## 2016-06-11 DIAGNOSIS — M50022 Cervical disc disorder at C5-C6 level with myelopathy: Secondary | ICD-10-CM | POA: Diagnosis not present

## 2016-06-11 DIAGNOSIS — Z7902 Long term (current) use of antithrombotics/antiplatelets: Secondary | ICD-10-CM | POA: Diagnosis not present

## 2016-06-11 DIAGNOSIS — Z8673 Personal history of transient ischemic attack (TIA), and cerebral infarction without residual deficits: Secondary | ICD-10-CM | POA: Diagnosis not present

## 2016-06-11 DIAGNOSIS — I2510 Atherosclerotic heart disease of native coronary artery without angina pectoris: Secondary | ICD-10-CM | POA: Diagnosis not present

## 2016-06-11 DIAGNOSIS — I110 Hypertensive heart disease with heart failure: Secondary | ICD-10-CM | POA: Diagnosis not present

## 2016-06-11 DIAGNOSIS — M2578 Osteophyte, vertebrae: Secondary | ICD-10-CM | POA: Diagnosis not present

## 2016-06-11 DIAGNOSIS — E782 Mixed hyperlipidemia: Secondary | ICD-10-CM | POA: Diagnosis not present

## 2016-06-11 DIAGNOSIS — I255 Ischemic cardiomyopathy: Secondary | ICD-10-CM | POA: Diagnosis not present

## 2016-06-11 DIAGNOSIS — Z955 Presence of coronary angioplasty implant and graft: Secondary | ICD-10-CM | POA: Diagnosis not present

## 2016-06-11 DIAGNOSIS — F17200 Nicotine dependence, unspecified, uncomplicated: Secondary | ICD-10-CM | POA: Diagnosis not present

## 2016-06-11 DIAGNOSIS — M50121 Cervical disc disorder at C4-C5 level with radiculopathy: Secondary | ICD-10-CM | POA: Diagnosis not present

## 2016-06-11 DIAGNOSIS — I509 Heart failure, unspecified: Secondary | ICD-10-CM | POA: Diagnosis not present

## 2016-06-11 DIAGNOSIS — Z951 Presence of aortocoronary bypass graft: Secondary | ICD-10-CM | POA: Diagnosis not present

## 2016-06-11 DIAGNOSIS — Z79899 Other long term (current) drug therapy: Secondary | ICD-10-CM | POA: Diagnosis not present

## 2016-06-11 DIAGNOSIS — G4730 Sleep apnea, unspecified: Secondary | ICD-10-CM | POA: Diagnosis not present

## 2016-06-11 DIAGNOSIS — Z7982 Long term (current) use of aspirin: Secondary | ICD-10-CM | POA: Diagnosis not present

## 2016-06-11 DIAGNOSIS — I252 Old myocardial infarction: Secondary | ICD-10-CM | POA: Diagnosis not present

## 2016-06-11 HISTORY — DX: Cerebral infarction, unspecified: I63.9

## 2016-06-11 LAB — BASIC METABOLIC PANEL
Anion gap: 10 (ref 5–15)
BUN: 15 mg/dL (ref 6–20)
CO2: 27 mmol/L (ref 22–32)
Calcium: 9.8 mg/dL (ref 8.9–10.3)
Chloride: 106 mmol/L (ref 101–111)
Creatinine, Ser: 0.92 mg/dL (ref 0.61–1.24)
GFR calc Af Amer: >60 mL/min (ref >60)
GFR calc non Af Amer: >60 mL/min (ref >60)
Glucose, Bld: 134 mg/dL — ABNORMAL HIGH (ref 65–99)
Potassium: 3.6 mmol/L (ref 3.5–5.1)
Sodium: 143 mmol/L (ref 135–145)

## 2016-06-11 LAB — CBC
HCT: 43.4 % (ref 39.0–52.0)
Hemoglobin: 13.5 g/dL (ref 13.0–17.0)
MCH: 29.4 pg (ref 26.0–34.0)
MCHC: 31.1 g/dL (ref 30.0–36.0)
MCV: 94.6 fL (ref 78.0–100.0)
Platelets: 215 10*3/uL (ref 150–400)
RBC: 4.59 MIL/uL (ref 4.22–5.81)
RDW: 12.8 % (ref 11.5–15.5)
WBC: 11.2 10*3/uL — ABNORMAL HIGH (ref 4.0–10.5)

## 2016-06-11 LAB — SURGICAL PCR SCREEN
MRSA, PCR: NEGATIVE
Staphylococcus aureus: POSITIVE — AB

## 2016-06-11 NOTE — Telephone Encounter (Signed)
New message       Request for surgical clearance:  1. What type of surgery is being performed? Neck surgery  2. When is this surgery scheduled?06-14-16  3. Are there any medications that need to be held prior to surgery and how long ?hold aspirin prior to procedure---got clearance for plavix but aspirin was not addressed  4. Name of physician performing surgery? Dr Rolena Infante  5. What is your office phone and fax number? Ok to put in epic

## 2016-06-11 NOTE — Telephone Encounter (Signed)
I spoke with [name] at preadmission and told her anti platelet therapy for surgery was being determined by Neuro and Dr. Rolena Infante.  I referenced scanned clearance form from Dr Johnsie Cancel and office visit with neuro on 05/15/16.

## 2016-06-11 NOTE — Progress Notes (Signed)
Per dr Johnsie Cancel office- dr Rolena Infante will be addressing whether pt is to stop aspirin. Left message for sherry at dr brooks office that patient did not know at this time and if she could let him know if need to stop. He will continue until otherwise notified.

## 2016-06-12 ENCOUNTER — Other Ambulatory Visit: Payer: BLUE CROSS/BLUE SHIELD

## 2016-06-12 ENCOUNTER — Telehealth: Payer: Self-pay | Admitting: Neurology

## 2016-06-12 DIAGNOSIS — G4733 Obstructive sleep apnea (adult) (pediatric): Secondary | ICD-10-CM

## 2016-06-12 NOTE — Telephone Encounter (Signed)
Diana:  Patient referred by Dr. Jaynee Eagles, seen by me on 05/22/16, split study on 06/07/16, ins: [employer]. Please call and notify patient that the recent sleep study confirmed the diagnosis of severe OSA. He did well with CPAP during the study with significant improvement of the respiratory events. Therefore, I would like start the patient on CPAP therapy at home by prescribing a machine for home use. I placed the order in the chart. The patient will need a follow up appointment with me in 8 to 10 weeks post set up that has to be scheduled; please go ahead and schedule while you have the patient on the phone and make sure patient understands the importance of keeping this window for the FU appointment, as it is often an insurance requirement and failing to adhere to this may result in losing coverage for sleep apnea treatment.  Please re-enforce the importance of compliance with treatment and the need for [place] to monitor compliance data - again an insurance requirement and good feedback for the patient as far as how they are doing.  Also remind patient, that any upcoming CPAP machine or mask issues, should be first addressed with the DME company. Please ask if patient has a preference regarding DME company.  Please arrange for CPAP set up at home through a DME company of patient's choice - once you have spoken to the patient - and faxed/routed report to PCP and referring MD (if other than PCP), you can close this encounter, thanks,   Star Age, MD, PhD [facility] ([place])

## 2016-06-12 NOTE — Telephone Encounter (Signed)
I spoke to patient and he is aware of results and recommendations. He is willing to proceed with treatment. I will send orders to [facility]. I will send report to PCP. Patient will also get a letter reminding him to make f/u appt and stress the importance of compliance.

## 2016-06-12 NOTE — Telephone Encounter (Signed)
Dr Ahern- please advise 

## 2016-06-12 NOTE — Progress Notes (Signed)
Anesthesia Chart Review:  Pt is a 60 year old male scheduled for C4-5 ACDF on 06/14/2016 with Melina Schools, MD.   Cardiologist is Jenkins Rouge, MD who has cleared pt for surgery.  Neurologist is Sarina Ill, MD.  PCP is Dewayne Shorter, PA (care everywhere).   PMH includes:  CAD (stent to RCA and LAD 2002 & 2008; s/p CABG 01/01/14), MI, ischemic cardiomyopathy, stroke (03/15/16), HTN, hyperlipidemia, OSA, loop recorder (04/05/16), GERD. Former smoker. BMI 25.  S/p ORIF scaphoid 10/15/12.   Medications include: ASA, lipitor, carvedilol, plavix, losartan, metoprolol. Pt stopped plavix 06/09/16.   Preoperative labs reviewed.    EKG 03/16/16: Sinus rhythm. Probable anterior infarct, age indeterminate.   Echo 03/18/16:  - Left ventricle: The cavity size was normal. There was mild focal basal hypertrophy of the septum. Systolic function was moderately reduced. The estimated ejection fraction was in the range of 35% to 40%. Dyskinesis and aneurysmal deformity of the mid-apicalanteroseptal, inferior, inferoseptal, and apical myocardium. Doppler parameters are consistent with abnormal left ventricular relaxation (grade 1 diastolic dysfunction). Acoustic contrast opacification revealed no evidence ofthrombus. - Left atrium: The atrium was mildly dilated. - Atrial septum: No defect or patent foramen ovale was identified. - Impressions: Although no LV apical thrombus is seen curremtly, the apical dyskinesis appears to be a high risk finding for apical thrombosis and embolic events.  If no changes, I anticipate pt can proceed with surgery as scheduled.   Isaac Cass, FNP-BC [facility]/Anesthesiology Phone: [phone] 06/13/2016 4:26 PM

## 2016-06-12 NOTE — Telephone Encounter (Signed)
Judeen Hammans, Surgical Coordinator/ [place] [phone] called to advise, patient is scheduled for surgery (ACDF, C 4-5 for myelopathy) Thursday August 10th. It was just brought to their attention that patient had stroke a couple of months ago and is not to come off of Plavix and Aspirin until after August 13th. Patient came off of Plavix and Aspirin August 5th. Dr. Rolena Infante wants to know if it's okay to proceed with surgery? Please call Margarita Grizzle [phone] if calling back today or call Judeen Hammans tomorrow morning Judeen Hammans will be leaving today at 3:30pm).

## 2016-06-13 ENCOUNTER — Ambulatory Visit: Payer: BLUE CROSS/BLUE SHIELD | Admitting: Physical Therapy

## 2016-06-13 MED ORDER — CEFAZOLIN SODIUM-DEXTROSE 2-4 GM/100ML-% IV SOLN
2.0000 g | INTRAVENOUS | Status: AC
  Administered 2016-06-14: 2 g via INTRAVENOUS
  Filled 2016-06-13: qty 100

## 2016-06-13 NOTE — Telephone Encounter (Signed)
[name]/Dr. Rolena Infante / [place] office called and needs to know if pt can come off of Plavix and Aspirin. Pt has already come off medication without approval . Please call [phone]. May leave message. Also need to know if pt can go through with surgery.

## 2016-06-13 NOTE — Telephone Encounter (Signed)
Dr Jaynee Eagles- here is the phone note so you can document what you spoke with them about, thanks

## 2016-06-14 ENCOUNTER — Encounter (HOSPITAL_COMMUNITY): Payer: Self-pay | Admitting: Urology

## 2016-06-14 ENCOUNTER — Inpatient Hospital Stay (HOSPITAL_COMMUNITY): Payer: PPO

## 2016-06-14 ENCOUNTER — Inpatient Hospital Stay (HOSPITAL_COMMUNITY): Payer: PPO | Admitting: Emergency Medicine

## 2016-06-14 ENCOUNTER — Encounter (HOSPITAL_COMMUNITY)
Admission: RE | Disposition: A | Discharge status: Home / Self Care | Payer: Self-pay | Source: Ambulatory Visit | Attending: Orthopedic Surgery

## 2016-06-14 ENCOUNTER — Inpatient Hospital Stay (HOSPITAL_COMMUNITY): Payer: PPO | Admitting: Certified Registered"

## 2016-06-14 ENCOUNTER — Observation Stay (HOSPITAL_COMMUNITY)
Admission: RE | Admit: 2016-06-14 | Discharge: 2016-06-15 | Disposition: A | Discharge status: Home / Self Care | Payer: PPO | Source: Ambulatory Visit | Attending: Orthopedic Surgery | Admitting: Orthopedic Surgery

## 2016-06-14 DIAGNOSIS — F17200 Nicotine dependence, unspecified, uncomplicated: Secondary | ICD-10-CM | POA: Insufficient documentation

## 2016-06-14 DIAGNOSIS — I509 Heart failure, unspecified: Secondary | ICD-10-CM | POA: Insufficient documentation

## 2016-06-14 DIAGNOSIS — Z981 Arthrodesis status: Secondary | ICD-10-CM

## 2016-06-14 DIAGNOSIS — Z955 Presence of coronary angioplasty implant and graft: Secondary | ICD-10-CM | POA: Insufficient documentation

## 2016-06-14 DIAGNOSIS — Z951 Presence of aortocoronary bypass graft: Secondary | ICD-10-CM | POA: Insufficient documentation

## 2016-06-14 DIAGNOSIS — G4730 Sleep apnea, unspecified: Secondary | ICD-10-CM | POA: Insufficient documentation

## 2016-06-14 DIAGNOSIS — M50121 Cervical disc disorder at C4-C5 level with radiculopathy: Secondary | ICD-10-CM | POA: Diagnosis not present

## 2016-06-14 DIAGNOSIS — Z419 Encounter for procedure for purposes other than remedying health state, unspecified: Secondary | ICD-10-CM

## 2016-06-14 DIAGNOSIS — I255 Ischemic cardiomyopathy: Secondary | ICD-10-CM | POA: Diagnosis not present

## 2016-06-14 DIAGNOSIS — I110 Hypertensive heart disease with heart failure: Secondary | ICD-10-CM | POA: Diagnosis not present

## 2016-06-14 DIAGNOSIS — M2578 Osteophyte, vertebrae: Secondary | ICD-10-CM | POA: Insufficient documentation

## 2016-06-14 DIAGNOSIS — Z7902 Long term (current) use of antithrombotics/antiplatelets: Secondary | ICD-10-CM | POA: Insufficient documentation

## 2016-06-14 DIAGNOSIS — I2510 Atherosclerotic heart disease of native coronary artery without angina pectoris: Secondary | ICD-10-CM | POA: Insufficient documentation

## 2016-06-14 DIAGNOSIS — M50021 Cervical disc disorder at C4-C5 level with myelopathy: Secondary | ICD-10-CM | POA: Diagnosis not present

## 2016-06-14 DIAGNOSIS — Z8673 Personal history of transient ischemic attack (TIA), and cerebral infarction without residual deficits: Secondary | ICD-10-CM | POA: Insufficient documentation

## 2016-06-14 DIAGNOSIS — M4802 Spinal stenosis, cervical region: Secondary | ICD-10-CM | POA: Diagnosis not present

## 2016-06-14 DIAGNOSIS — E782 Mixed hyperlipidemia: Secondary | ICD-10-CM | POA: Diagnosis not present

## 2016-06-14 DIAGNOSIS — G959 Disease of spinal cord, unspecified: Secondary | ICD-10-CM | POA: Diagnosis present

## 2016-06-14 DIAGNOSIS — I252 Old myocardial infarction: Secondary | ICD-10-CM | POA: Insufficient documentation

## 2016-06-14 DIAGNOSIS — Z79899 Other long term (current) drug therapy: Secondary | ICD-10-CM | POA: Insufficient documentation

## 2016-06-14 DIAGNOSIS — M50022 Cervical disc disorder at C5-C6 level with myelopathy: Secondary | ICD-10-CM | POA: Diagnosis not present

## 2016-06-14 DIAGNOSIS — Z7982 Long term (current) use of aspirin: Secondary | ICD-10-CM | POA: Insufficient documentation

## 2016-06-14 HISTORY — PX: ANTERIOR CERVICAL DECOMP/DISCECTOMY FUSION: SHX1161

## 2016-06-14 SURGERY — ANTERIOR CERVICAL DECOMPRESSION/DISCECTOMY FUSION 1 LEVEL
Anesthesia: General

## 2016-06-14 MED ORDER — ACETAMINOPHEN 10 MG/ML IV SOLN
INTRAVENOUS | Status: DC | PRN
  Administered 2016-06-14: 1000 mg via INTRAVENOUS

## 2016-06-14 MED ORDER — ROCURONIUM BROMIDE 10 MG/ML (PF) SYRINGE
PREFILLED_SYRINGE | INTRAVENOUS | Status: AC
  Filled 2016-06-14: qty 10

## 2016-06-14 MED ORDER — DEXAMETHASONE SODIUM PHOSPHATE 4 MG/ML IJ SOLN: INTRAMUSCULAR | Status: DC | PRN

## 2016-06-14 MED ORDER — METHOCARBAMOL 500 MG PO TABS
500.0000 mg | ORAL_TABLET | Freq: Four times a day (QID) | ORAL | Status: DC | PRN
  Administered 2016-06-14: 500 mg via ORAL
  Filled 2016-06-14 (×3): qty 1

## 2016-06-14 MED ORDER — FENTANYL CITRATE (PF) 100 MCG/2ML IJ SOLN
INTRAMUSCULAR | Status: AC
  Filled 2016-06-14: qty 2

## 2016-06-14 MED ORDER — PROPOFOL 10 MG/ML IV BOLUS
INTRAVENOUS | Status: AC
  Filled 2016-06-14: qty 20

## 2016-06-14 MED ORDER — EPHEDRINE 5 MG/ML INJ
INTRAVENOUS | Status: AC
  Filled 2016-06-14: qty 10

## 2016-06-14 MED ORDER — SODIUM CHLORIDE 0.9% FLUSH: 3.0000 mL | Freq: Two times a day (BID) | INTRAVENOUS | Status: DC

## 2016-06-14 MED ORDER — FENTANYL CITRATE (PF) 100 MCG/2ML IJ SOLN
25.0000 ug | INTRAMUSCULAR | Status: DC | PRN
  Administered 2016-06-14: 50 ug via INTRAVENOUS

## 2016-06-14 MED ORDER — HEMOSTATIC AGENTS (NO CHARGE) OPTIME
TOPICAL | Status: DC | PRN
  Administered 2016-06-14: 1 via TOPICAL

## 2016-06-14 MED ORDER — GLYCOPYRROLATE 0.2 MG/ML IV SOSY
PREFILLED_SYRINGE | INTRAVENOUS | Status: AC
  Filled 2016-06-14: qty 3

## 2016-06-14 MED ORDER — ATORVASTATIN CALCIUM 20 MG PO TABS
40.0000 mg | ORAL_TABLET | Freq: Every day | ORAL | Status: DC
  Administered 2016-06-14: 40 mg via ORAL
  Filled 2016-06-14: qty 2

## 2016-06-14 MED ORDER — MIDAZOLAM HCL 2 MG/2ML IJ SOLN
INTRAMUSCULAR | Status: AC
  Filled 2016-06-14: qty 2

## 2016-06-14 MED ORDER — DEXAMETHASONE 4 MG PO TABS
4.0000 mg | ORAL_TABLET | Freq: Four times a day (QID) | ORAL | Status: DC
  Administered 2016-06-14 – 2016-06-15 (×3): 4 mg via ORAL
  Filled 2016-06-14 (×3): qty 1

## 2016-06-14 MED ORDER — LIDOCAINE 2% (20 MG/ML) 5 ML SYRINGE
INTRAMUSCULAR | Status: AC
  Filled 2016-06-14: qty 5

## 2016-06-14 MED ORDER — METHOCARBAMOL 500 MG PO TABS: 500.0000 mg | ORAL_TABLET | Freq: Three times a day (TID) | ORAL | 0 refills | Status: DC | PRN

## 2016-06-14 MED ORDER — ONDANSETRON HCL 4 MG/2ML IJ SOLN: 4.0000 mg | INTRAMUSCULAR | Status: DC | PRN

## 2016-06-14 MED ORDER — METOPROLOL TARTRATE 12.5 MG HALF TABLET
12.5000 mg | ORAL_TABLET | Freq: Two times a day (BID) | ORAL | Status: DC
  Administered 2016-06-14 – 2016-06-15 (×2): 12.5 mg via ORAL
  Filled 2016-06-14 (×2): qty 1

## 2016-06-14 MED ORDER — SODIUM CHLORIDE 0.9 % IV SOLN: 250.0000 mL | INTRAVENOUS | Status: DC

## 2016-06-14 MED ORDER — FENTANYL CITRATE (PF) 250 MCG/5ML IJ SOLN
INTRAMUSCULAR | Status: AC
  Filled 2016-06-14: qty 5

## 2016-06-14 MED ORDER — LABETALOL HCL 5 MG/ML IV SOLN
INTRAVENOUS | Status: DC | PRN
  Administered 2016-06-14: 10 mg via INTRAVENOUS

## 2016-06-14 MED ORDER — OXYCODONE HCL 5 MG PO TABS
10.0000 mg | ORAL_TABLET | ORAL | Status: DC | PRN
  Administered 2016-06-14 – 2016-06-15 (×3): 10 mg via ORAL
  Filled 2016-06-14 (×4): qty 2

## 2016-06-14 MED ORDER — EPHEDRINE SULFATE 50 MG/ML IJ SOLN
INTRAMUSCULAR | Status: DC | PRN
  Administered 2016-06-14: 10 mg via INTRAVENOUS

## 2016-06-14 MED ORDER — LACTATED RINGERS IV SOLN
INTRAVENOUS | Status: DC
  Administered 2016-06-14 (×2): via INTRAVENOUS

## 2016-06-14 MED ORDER — ESMOLOL HCL 100 MG/10ML IV SOLN
INTRAVENOUS | Status: DC | PRN
  Administered 2016-06-14: 30 mg via INTRAVENOUS
  Administered 2016-06-14: 20 mg via INTRAVENOUS

## 2016-06-14 MED ORDER — METHOCARBAMOL 500 MG PO TABS
ORAL_TABLET | ORAL | Status: AC
  Administered 2016-06-14: 500 mg
  Filled 2016-06-14: qty 1

## 2016-06-14 MED ORDER — OXYCODONE-ACETAMINOPHEN 10-325 MG PO TABS: 1.0000 | ORAL_TABLET | ORAL | 0 refills | Status: DC | PRN

## 2016-06-14 MED ORDER — SODIUM CHLORIDE 0.9% FLUSH: 3.0000 mL | INTRAVENOUS | Status: DC | PRN

## 2016-06-14 MED ORDER — DEXAMETHASONE SODIUM PHOSPHATE 4 MG/ML IJ SOLN: 4.0000 mg | Freq: Four times a day (QID) | INTRAMUSCULAR | Status: DC

## 2016-06-14 MED ORDER — ESMOLOL HCL 100 MG/10ML IV SOLN: INTRAVENOUS | Status: DC | PRN

## 2016-06-14 MED ORDER — ETOMIDATE 2 MG/ML IV SOLN
INTRAVENOUS | Status: AC
  Filled 2016-06-14: qty 10

## 2016-06-14 MED ORDER — 0.9 % SODIUM CHLORIDE (POUR BTL) OPTIME
TOPICAL | Status: DC | PRN
  Administered 2016-06-14 (×2): 1000 mL

## 2016-06-14 MED ORDER — PHENYLEPHRINE HCL 10 MG/ML IJ SOLN
INTRAVENOUS | Status: DC | PRN
  Administered 2016-06-14: 30 ug/min via INTRAVENOUS

## 2016-06-14 MED ORDER — PHENYLEPHRINE 40 MCG/ML (10ML) SYRINGE FOR IV PUSH (FOR BLOOD PRESSURE SUPPORT)
PREFILLED_SYRINGE | INTRAVENOUS | Status: AC
  Filled 2016-06-14: qty 10

## 2016-06-14 MED ORDER — PROPOFOL 500 MG/50ML IV EMUL
INTRAVENOUS | Status: DC | PRN
  Administered 2016-06-14: 25 ug/kg/min via INTRAVENOUS

## 2016-06-14 MED ORDER — SUCCINYLCHOLINE CHLORIDE 20 MG/ML IJ SOLN
INTRAMUSCULAR | Status: DC | PRN
  Administered 2016-06-14: 140 mg via INTRAVENOUS

## 2016-06-14 MED ORDER — PROPOFOL 10 MG/ML IV BOLUS
INTRAVENOUS | Status: DC | PRN
  Administered 2016-06-14: 150 mg via INTRAVENOUS

## 2016-06-14 MED ORDER — FENTANYL CITRATE (PF) 100 MCG/2ML IJ SOLN
INTRAMUSCULAR | Status: DC | PRN
  Administered 2016-06-14 (×2): 50 ug via INTRAVENOUS
  Administered 2016-06-14: 150 ug via INTRAVENOUS
  Administered 2016-06-14 (×2): 50 ug via INTRAVENOUS
  Administered 2016-06-14: 100 ug via INTRAVENOUS
  Administered 2016-06-14: 50 ug via INTRAVENOUS

## 2016-06-14 MED ORDER — PHENYLEPHRINE HCL 10 MG/ML IJ SOLN
INTRAMUSCULAR | Status: DC | PRN
  Administered 2016-06-14: 40 ug via INTRAVENOUS
  Administered 2016-06-14: 80 ug via INTRAVENOUS

## 2016-06-14 MED ORDER — DEXAMETHASONE SODIUM PHOSPHATE 10 MG/ML IJ SOLN
INTRAMUSCULAR | Status: AC
  Filled 2016-06-14: qty 1

## 2016-06-14 MED ORDER — LOSARTAN POTASSIUM 50 MG PO TABS
100.0000 mg | ORAL_TABLET | Freq: Every day | ORAL | Status: DC
  Administered 2016-06-14 – 2016-06-15 (×2): 100 mg via ORAL
  Filled 2016-06-14 (×2): qty 2

## 2016-06-14 MED ORDER — ACETAMINOPHEN 10 MG/ML IV SOLN
INTRAVENOUS | Status: AC
  Filled 2016-06-14: qty 100

## 2016-06-14 MED ORDER — HYDROMORPHONE HCL 1 MG/ML IJ SOLN
INTRAMUSCULAR | Status: DC | PRN
  Administered 2016-06-14 (×2): 0.5 mg via INTRAVENOUS

## 2016-06-14 MED ORDER — PHENOL 1.4 % MT LIQD
1.0000 | OROMUCOSAL | Status: DC | PRN
  Administered 2016-06-14: 1 via OROMUCOSAL
  Filled 2016-06-14: qty 177

## 2016-06-14 MED ORDER — SUCCINYLCHOLINE CHLORIDE 200 MG/10ML IV SOSY
PREFILLED_SYRINGE | INTRAVENOUS | Status: AC
  Filled 2016-06-14: qty 10

## 2016-06-14 MED ORDER — CEFAZOLIN SODIUM-DEXTROSE 2-4 GM/100ML-% IV SOLN
2.0000 g | Freq: Three times a day (TID) | INTRAVENOUS | Status: AC
  Administered 2016-06-14 – 2016-06-15 (×2): 2 g via INTRAVENOUS
  Filled 2016-06-14 (×2): qty 100

## 2016-06-14 MED ORDER — LACTATED RINGERS IV SOLN: INTRAVENOUS | Status: DC

## 2016-06-14 MED ORDER — METHOCARBAMOL 1000 MG/10ML IJ SOLN
500.0000 mg | Freq: Four times a day (QID) | INTRAVENOUS | Status: DC | PRN
  Filled 2016-06-14: qty 5

## 2016-06-14 MED ORDER — BUPIVACAINE-EPINEPHRINE 0.25% -1:200000 IJ SOLN
INTRAMUSCULAR | Status: DC | PRN
  Administered 2016-06-14: 5 mL

## 2016-06-14 MED ORDER — THROMBIN 20000 UNITS EX SOLR
CUTANEOUS | Status: DC | PRN
  Administered 2016-06-14: 20000 [IU] via TOPICAL

## 2016-06-14 MED ORDER — CARVEDILOL 3.125 MG PO TABS
3.1250 mg | ORAL_TABLET | Freq: Every day | ORAL | Status: DC
  Administered 2016-06-14 – 2016-06-15 (×2): 3.125 mg via ORAL
  Filled 2016-06-14 (×2): qty 1

## 2016-06-14 MED ORDER — ONDANSETRON HCL 4 MG/2ML IJ SOLN
INTRAMUSCULAR | Status: DC | PRN
  Administered 2016-06-14: 4 mg via INTRAVENOUS

## 2016-06-14 MED ORDER — DEXAMETHASONE SODIUM PHOSPHATE 4 MG/ML IJ SOLN
INTRAMUSCULAR | Status: DC | PRN
  Administered 2016-06-14: 10 mg via INTRAVENOUS

## 2016-06-14 MED ORDER — MIDAZOLAM HCL 5 MG/5ML IJ SOLN
INTRAMUSCULAR | Status: DC | PRN
Start: 2016-06-14 — End: 2016-06-14
  Administered 2016-06-14: 2 mg via INTRAVENOUS

## 2016-06-14 MED ORDER — ONDANSETRON HCL 4 MG/2ML IJ SOLN
INTRAMUSCULAR | Status: AC
  Filled 2016-06-14: qty 2

## 2016-06-14 MED ORDER — THROMBIN 20000 UNITS EX SOLR
CUTANEOUS | Status: AC
  Filled 2016-06-14: qty 20000

## 2016-06-14 MED ORDER — LABETALOL HCL 5 MG/ML IV SOLN
INTRAVENOUS | Status: AC
  Filled 2016-06-14: qty 4

## 2016-06-14 MED ORDER — BUPIVACAINE-EPINEPHRINE (PF) 0.25% -1:200000 IJ SOLN
INTRAMUSCULAR | Status: AC
  Filled 2016-06-14: qty 30

## 2016-06-14 MED ORDER — HYDROMORPHONE HCL 1 MG/ML IJ SOLN
INTRAMUSCULAR | Status: AC
  Filled 2016-06-14: qty 1

## 2016-06-14 MED ORDER — MORPHINE SULFATE (PF) 2 MG/ML IV SOLN: 1.0000 mg | INTRAVENOUS | Status: DC | PRN

## 2016-06-14 MED ORDER — MENTHOL 3 MG MT LOZG
1.0000 | LOZENGE | OROMUCOSAL | Status: DC | PRN
  Filled 2016-06-14: qty 9

## 2016-06-14 MED ORDER — ESMOLOL HCL 100 MG/10ML IV SOLN
INTRAVENOUS | Status: AC
  Filled 2016-06-14: qty 10

## 2016-06-14 MED ORDER — ONDANSETRON HCL 4 MG PO TABS: 4.0000 mg | ORAL_TABLET | Freq: Three times a day (TID) | ORAL | 0 refills | Status: DC | PRN

## 2016-06-14 SURGICAL SUPPLY — 61 items
BUR MATCHSTICK NEURO 3.0X3.8 (BURR) ×2 IMPLANT
BUR ROUND FLUTED 4 SOFT TCH (BURR) IMPLANT
CANISTER SUCTION 2500CC (MISCELLANEOUS) ×4 IMPLANT
CLSR STERI-STRIP ANTIMIC 1/2X4 (GAUZE/BANDAGES/DRESSINGS) ×2 IMPLANT
CORDS BIPOLAR (ELECTRODE) ×2 IMPLANT
COVER SURGICAL LIGHT HANDLE (MISCELLANEOUS) ×2 IMPLANT
CRADLE DONUT ADULT HEAD (MISCELLANEOUS) ×2 IMPLANT
DEVICE EDNSKLTN TC NNLCK MED 8 (Neuro Prosthesis/Implant) ×2 IMPLANT
DRAPE C-ARM 42X72 X-RAY (DRAPES) ×2 IMPLANT
DRAPE POUCH INSTRU U-SHP 10X18 (DRAPES) ×2 IMPLANT
DRAPE SURG 17X23 STRL (DRAPES) ×2 IMPLANT
DRAPE U-SHAPE 47X51 STRL (DRAPES) ×2 IMPLANT
DRSG AQUACEL AG ADV 3.5X 4 (GAUZE/BANDAGES/DRESSINGS) ×2 IMPLANT
DRSG AQUACEL AG ADV 3.5X 6 (GAUZE/BANDAGES/DRESSINGS) ×2 IMPLANT
DURAPREP 6ML APPLICATOR 50/CS (WOUND CARE) ×2 IMPLANT
ELECT COATED BLADE 2.86 ST (ELECTRODE) ×2 IMPLANT
ELECT PENCIL ROCKER SW 15FT (MISCELLANEOUS) ×2 IMPLANT
ELECT REM PT RETURN 9FT ADLT (ELECTROSURGICAL) ×2
ELECTRODE REM PT RTRN 9FT ADLT (ELECTROSURGICAL) ×1 IMPLANT
ENDOSKELTON IMPLANT TC MED 8MM (Neuro Prosthesis/Implant) ×4 IMPLANT
FEE INTRAOP MONITOR IMPULS NCS (MISCELLANEOUS) ×1 IMPLANT
GLOVE BIO SURGEON STRL SZ 6.5 (GLOVE) ×2 IMPLANT
GLOVE BIOGEL PI IND STRL 6.5 (GLOVE) ×1 IMPLANT
GLOVE BIOGEL PI IND STRL 8.5 (GLOVE) ×1 IMPLANT
GLOVE BIOGEL PI INDICATOR 6.5 (GLOVE) ×1
GLOVE BIOGEL PI INDICATOR 8.5 (GLOVE) ×1
GLOVE SS BIOGEL STRL SZ 8.5 (GLOVE) ×1 IMPLANT
GLOVE SUPERSENSE BIOGEL SZ 8.5 (GLOVE) ×1
GOWN STRL REUS W/ TWL LRG LVL3 (GOWN DISPOSABLE) ×1 IMPLANT
GOWN STRL REUS W/TWL 2XL LVL3 (GOWN DISPOSABLE) ×4 IMPLANT
GOWN STRL REUS W/TWL LRG LVL3 (GOWN DISPOSABLE) ×2
INTRAOP MONITOR FEE IMPULS NCS (MISCELLANEOUS) ×1
INTRAOP MONITOR FEE IMPULSE (MISCELLANEOUS) ×1
KIT BASIN OR (CUSTOM PROCEDURE TRAY) ×2 IMPLANT
KIT ROOM TURNOVER OR (KITS) ×2 IMPLANT
NEEDLE SPNL 18GX3.5 QUINCKE PK (NEEDLE) ×2 IMPLANT
NS IRRIG 1000ML POUR BTL (IV SOLUTION) ×4 IMPLANT
PACK ORTHO CERVICAL (CUSTOM PROCEDURE TRAY) ×2 IMPLANT
PACK UNIVERSAL I (CUSTOM PROCEDURE TRAY) ×2 IMPLANT
PAD ARMBOARD 7.5X6 YLW CONV (MISCELLANEOUS) ×4 IMPLANT
PATTIES SURGICAL .25X.25 (GAUZE/BANDAGES/DRESSINGS) ×2 IMPLANT
PIN DISTRACTION 14 (PIN) ×2 IMPLANT
PLATE SKYLINE TWO LEVEL 32MM (Plate) ×2 IMPLANT
PUTTY BONE DBX 5CC MIX (Putty) ×2 IMPLANT
RESTRAINT LIMB HOLDER UNIV (RESTRAINTS) ×2 IMPLANT
SCREW SKYLINE 14MM SD-VA (Screw) ×8 IMPLANT
SCREW SKYLINE 16MM (Screw) ×4 IMPLANT
SPONGE INTESTINAL PEANUT (DISPOSABLE) ×2 IMPLANT
SPONGE SURGIFOAM ABS GEL 100 (HEMOSTASIS) ×2 IMPLANT
SURGIFLO W/THROMBIN 8M KIT (HEMOSTASIS) ×2 IMPLANT
SUT BONE WAX W31G (SUTURE) ×2 IMPLANT
SUT MON AB 3-0 SH 27 (SUTURE) ×1
SUT MON AB 3-0 SH27 (SUTURE) ×1 IMPLANT
SUT VIC AB 2-0 CT1 18 (SUTURE) ×2 IMPLANT
SYR BULB IRRIGATION 50ML (SYRINGE) ×2 IMPLANT
SYR CONTROL 10ML LL (SYRINGE) ×2 IMPLANT
TAPE CLOTH 4X10 WHT NS (GAUZE/BANDAGES/DRESSINGS) ×2 IMPLANT
TAPE UMBILICAL COTTON 1/8X30 (MISCELLANEOUS) ×2 IMPLANT
TOWEL OR 17X24 6PK STRL BLUE (TOWEL DISPOSABLE) ×2 IMPLANT
TOWEL OR 17X26 10 PK STRL BLUE (TOWEL DISPOSABLE) ×2 IMPLANT
YANKAUER SUCT BULB TIP NO VENT (SUCTIONS) ×2 IMPLANT

## 2016-06-14 NOTE — Telephone Encounter (Incomplete)
I spoke to Dr. Valla Leaver office. There is increased risk for another stroke when he is off of his medication but I did speak to patient at last appointment and he understands this, he feels the benefits outweigh the risks. He has already been off of his medication anyway so I would not hold off on surgery at this point, would go ahead. Would prefer to keep him on asa81 mg but this is at Dr. Valla Leaver discretion based on bleeding risk. I would be very careful to avoid any hypotension dueing

## 2016-06-14 NOTE — Anesthesia Procedure Notes (Addendum)
Procedure Name: Intubation Date/Time: 06/14/2016 9:48 AM Performed by: Huey Romans ANN Pre-anesthesia Checklist: Patient identified, Emergency Drugs available, Suction available and Patient being monitored Patient Re-evaluated:Patient Re-evaluated prior to inductionOxygen Delivery Method: Circle System Utilized Preoxygenation: Pre-oxygenation with 100% oxygen Intubation Type: IV induction Ventilation: Mask ventilation without difficulty Laryngoscope Size: Glidescope and 3 Tube type: Oral Tube size: 7.5 mm Number of attempts: 1 Airway Equipment and Method: Stylet,  Oral airway and LTA kit utilized Placement Confirmation: ETT inserted through vocal cords under direct vision,  positive ETCO2 and breath sounds checked- equal and bilateral Secured at: 23 cm Tube secured with: Tape Dental Injury: Teeth and Oropharynx as per pre-operative assessment

## 2016-06-14 NOTE — Telephone Encounter (Signed)
I spoke to Dr. Valla Leaver office yesterday. There is slightly increased risk for another stroke when he is off of his medication but I did speak to patient at last appointment and he understands this, he feels the benefits outweigh the risks. He has already been off of his asa and plavix medication anyway so I would not hold off on surgery at this point, would go ahead. Would prefer to keep him on asa81 mg but this is at Dr. Valla Leaver discretion based on bleeding risk. I would be very careful to avoid any hypotension duriing the surgery. After the surgery would like him to resume Plavix alone (not asa) as soon as safely possible. thanks

## 2016-06-14 NOTE — H&P (Signed)
History of Present Illness  The patient is a 60 year old male who comes in today for a preoperative History and Physical. The patient is scheduled for a ACDF C4-5 to be performed by Dr. Duane Lope D. Rolena Infante, MD at [facility] on 06-14-16 . Please see the hospital record for complete dictated history and physical. The pt had a CVA 8 weeks ago. He understands to stop Plavix tomorrow. He has had 2 MIs. Pt has sleep apnea.   Problem List/Past Medical  Problems Reconciled  Altered gait (R26.9)  Carpal tunnel syndrome of left wrist (G56.02)  Closed displaced fracture of middle third of scaphoid of right wrist with routine healing (S62.021D)  Chronic bilateral low back pain with right-sided sciatica (M54.41)  Lumbar DDD (M51.36)  Disorder of intervertebral disc at C5-C6 level with myelopathy (M50.022)  Cerebrovascular accident (CVA) due to other mechanism (I63.8)  Numbness of left hand (R20.0)  Facet arthropathy, lumbar (M12.88)  Spinal stenosis, lumbar (M48.06)   Allergies No Known Drug Allergies [09/10/2015]: Allergies Reconciled   Family History  Congestive Heart Failure  Father, Paternal Grandfather, Paternal Grandmother. First Degree Relatives  reported Heart Disease  Father, Paternal Grandfather, Paternal Grandmother. Heart disease in male family member before age 35   Social History  Tobacco use  Current every day smoker. 09/10/2015 Tobacco / smoke exposure  09/10/2015: yes outdoors only Children  3 Current drinker  09/10/2015: Currently drinks wine only occasionally per week Current work status  working full time Exercise  Exercises rarely; does other Living situation  live with spouse Marital status  married No history of drug/alcohol rehab  Not under pain contract  Number of flights of stairs before winded  less than 1  Medication History  Clopidogrel Bisulfate (75MG  Tablet, Oral) Active. (qd) Triamcinolone Acetonide (0.5% Ointment,  External) Active. (prn) Multi Vitamin Mens (Oral) Active. (qd) Atorvastatin Calcium (40MG  Tablet, Oral) Active. (qd) Carvedilol (3.125MG  Tablet, Oral) Active. (qd) Losartan Potassium (100MG  Tablet, Oral) Active. (qd) Aspirin (81MG  Tablet, Oral) Active. (qd) Medications Reconciled  Past Surgical History  Arthroscopy of Knee  bilateral Coronary Artery Bypass Graft  unknown number of vessels Heart Stents  Thyroidectomy; Total   Other Problems Anxiety Disorder  Congestive Heart Failure  Coronary artery disease  Gastroesophageal Reflux Disease  High blood pressure  Hypercholesterolemia  Hypothyroidism  Myocardial infarction  Sleep Apnea   Vitals 06/08/2016 12:39 PM Weight: 149 lb Height: 67.5in Body Surface Area: 1.79 m Body Mass Index: 22.99 kg/m  Temp.: 98.32F(Oral)  Pulse: 84 (Regular)  BP: 157/94 (Sitting, Left Arm, Standard)  General General Appearance-Not in acute distress. Orientation-Oriented X3. Build & Nutrition-Well nourished and Well developed.  Integumentary General Characteristics Surgical Scars - no surgical scar evidence of previous cervical surgery. Cervical Spine-Skin examination of the cervical spine is without deformity, skin lesions, lacerations or abrasions.  Chest and Lung Exam Auscultation Breath sounds - Normal and Clear.  Cardiovascular Auscultation Rhythm - Regular rate and rhythm.  Peripheral Vascular Upper Extremity Palpation - Radial pulse - Bilateral - 2+.  Neurologic Sensation Upper Extremity - Left - sensation is diminished in the upper extremity. Right - sensation is intact in the upper extremity. Reflexes Biceps Reflex - Bilateral - 2+. Brachioradialis Reflex - Bilateral - 2+. Triceps Reflex - Bilateral - 2+. Hoffman's Sign - Bilateral - Hoffman's sign present.  Musculoskeletal Spine/Ribs/Pelvis  Cervical Spine : Inspection and Palpation - Tenderness - right cervical paraspinals tender  to palpation, left cervical paraspinals tender to palpation, right trapezius tender to palpation  and left trapezius tender to palpation. Strength and Tone: Strength: Strength: Strength - Biceps - Bilateral - 5/5. Right - 5/5. Deltoid - Left - 3/5. Triceps - Bilateral - 5/5. Wrist Extension - Bilateral - 5/5. Hand Grip - Bilateral - 5/5. ROM - Flexion - Mildly decreased and painful. Extension - Mildly decreased and painful. Left Lateral Flexion - Mildly decreased and painful. Right Lateral Flexion - Mildly decreased and painful. Left Rotation - Mildly decreased and painful. Right Rotation - Mildly decreased and painful. Pain - . Cervical Spine - Special Testing - axial compression test negative, cross chest impingement test negative. Non-Anatomic Signs - No non-anatomic signs present. Upper Extremity Range of Motion - No truesholder pain with IR/ER of the shoulders.  Babinski test is positive. Hoffmann's test is positive. Brisk lower extremity deep tendon reflexes, 3+. Continues to have altered gait pattern. He is having to utilize a walker. Generalized weakness in his upper extremities. No loss of bowel or bladder control.    MRI of the neck demonstrates mild cord atrophy with severe bilateral neural foraminal stenosis at C4-5. At C5-C6, there is significant canal stenosis with cord signal change consistent with myelomalacia. Mild degenerative disease, 2-3, 3-4, 6-7 and 7-1.  Assessment & Plan  Disorder of intervertebral disc at C4-C6 level with myelopathy  Current Plans  Anterior cervical fusion:Risks of surgery include, but are not limited to: Throat pain, swallowing difficulty, hoarseness or change in voice, death, stroke, paralysis, nerve root damage/injury, bleeding, blood clots, loss of bowel/bladder control, hardware failure, or mal-position, spinal fluid leak, adjacent segment disease, non-union, need for further surgery, ongoing or worse pain, infection. Post-operative bleeding or swelling  that could require emergent surgery.  Goal Of Surgery: Discussed that goal of surgery is to reduce pain and improve function and quality of life. Patient is aware that despite all appropriate treatment that there pain and function could be the same, worse, or different. Follow up in 2 weeks post op  At this point in time, the patient's clinical exam is consistent with myelopathy as is his MRI. At this point, I have recommended moving forward with surgery. It is clear that conservative management for cervical spondylitic myelopathy is actually surgery. We have reviewed the risks and benefits as well as the goals of surgery. All of his questions were addressed.  We will plan on doing this once we have cardiac clearance as well as clearance from his primary care physician. Because this is a two-level procedure and he has a history of smoking, I am also going to recommend an external bone stimulator to improve our overall fusion rate.  Note: Surgical level was incorrect on the consent - noted C4-5 only. However, myelopathic level is C5-6.  Discussed with the patient and wife. Corrected the consent to include both levels.  Patient demonstrates left C radiculopathy as well as myelopathy secondary to cord compression at C5/6.

## 2016-06-14 NOTE — Transfer of Care (Signed)
Immediate Anesthesia Transfer of Care Note  Patient: [name]  Procedure(s) Performed: Procedure(s): ANTERIOR CERVICAL DECOMPRESSION/DISCECTOMY FUSION C4 - C5, CERVICAL 6-C7 2 LEVEL (N/A)  Patient Location: PACU  Anesthesia Type:General  Level of Consciousness: awake and alert   Airway & Oxygen Therapy: Patient Spontanous Breathing and Patient connected to face mask  Post-op Assessment: Report given to RN and Post -op Vital signs reviewed and stable  Post vital signs: Reviewed and stable  Last Vitals:  Vitals:   06/14/16 0706 06/14/16 0728  BP: (!) 171/98 (!) 161/99  Pulse: 66   Resp: 20   Temp: 36.6 C     Last Pain:  Vitals:   06/14/16 0714  TempSrc:   PainSc: 5          Complications: No apparent anesthesia complications

## 2016-06-14 NOTE — Discharge Instructions (Signed)
° ° ° ° ° °Today you will be discharged from the hospital.  The purpose of the following handout is to help guide you over the next 2 weeks.  First and foremost, be sure you have a follow up appointment with Dr. Umeka Wrench 2 weeks from the time of your surgery to have your sutures removed.  Please call [facility] [phone] to schedule or confirm this appointment.   ° ° ° °Brace °You do not have to wear the collar while lying in bed or sitting in a high-backed chair, eating, sleeping or showering.  Other than these instances, you must wear the brace.  You may NOT wear the collar while driving a vehicle (see driving restrictions below).  It is advisable that you wear the collar in public places or while traveling in a car as a passenger.  Dr. Bralin Garry will discuss further use of the collar at your 2 week postop visit. ° °Wound Care °You may SHOWER 5 days from the date of surgery.  Shower directly over the steri-strips.  DO NOT scrub or submerge (bath tub, swimming pool, hot tub, etc.) the area.  Pat to dry following your shower.  There is no need for additional dressings other than the steri-strips.  Allow the steri-strips to fall off on their own.  Once the strips have fallen off, you may leave the area undressed.  DO NOT apply lotion/cream/ointment to the area.  The wound must remain dry at all times other than while showering.  Dr. Tyshan Enderle or his staff will remove your stiches at your first postop visit and give you additional instructions regarding wound care at that time.  ° °Activity °NO DRIVING FOR 2 WEEKS.  No lifting over 5 pounds (approximately a gallon of milk).  No bending, stooping, squatting or twisting.  No overhead activities.  We encourage you to walk (short distances and often throughout the day) as you can tolerate.  A good rule of thumb is to get up and move once or twice every hour.  You may go up and down stairs carefully.  As you continue to recover, Dr. Brayen Bunn will address and  adjust restrictions to your activities until no further restrictions are needed.  However, until your first postop visit, when Dr. Celester Lech can assess your recovery, you are to follow these instructions.  At the end of this document is a tentative outline of activities for up to 1 year.   ° ° ° ° °Medication °You will be discharged from the hospital with medication for pain, spasm, nausea and constipation.  You will be given enough medication to last until your first postop visit in 2 weeks.  Medications WILL NOT BE REFILLED EARLY; therefore, you are to take the medications only as directed.  If you have been given multiple prescriptions, please leave them with your pharmacy.  They can keep them on file for when you need them.  Medications that are lost or stolen WILL NOT be replaced.  We will address the need for continuing certain medications on an individual basis during your postop visit.  We ask that you avoid over the counter anti-inflammatory medications (Advil, Aleve, Motrin) for 3 months.   ° °What you can expect following neck surgery... °It is not uncommon to experience a sore throat or difficulty swallowing following neck surgery.  Cold liquids and soft foods are helpful in soothing this discomfort.  There is no specific diet that you are to follow after surgery, however, there are a   few things you should keep in mind to avoid unneeded discomfort.  Take small bites and eat slowly.  Chew your food thoroughly before swallowing.   It is not uncommon to experience incisional soreness or pain in the back of the neck, shoulders or between the shoulder blades.  These symptoms will slowly begin to resolve as you continue to recover, however, they can last for a few weeks.    It is not uncommon to experience INTERMITTENT arm pain following surgery.  This pain can mimic the arm pain you had prior to surgery.  As long as the pain resolves on its own and is not constant, there is no need to become alarmed.    When To Call If you experience fever >101F, loss of bowel or bladder control, painful swelling in the lower extremities, constant (unresolving) arm pain.  If you experience any of these symptoms, please call [place] [phone].  What's Next As mentioned earlier, you will follow up with Dr. Rolena Infante in 2 weeks.  At that time, we will likely remove your stitches and discuss additional aspects of your recovery.                   ACTIVITY GUIDELINES ANTERIOR CERVICAL DISECTOMY AND FUSION  Activity Discharge 2 weeks 6 weeks 3 months 6 months 1 year  Shower 5 days        Submerge the wound  no no yes     Walking outdoors yes       Lifting 5 lbs yes       Climbing stairs yes       Cooking yes       Car rides (less than 30 minutes) yes       Car rides (greater than 30 minutes) no varies yes     Air travel no varies yes     Short outings [employer], visits, etc...) yes       School no no yes     Driving a car no no varies yes    Light upper extremity exercises no no varies yes    Stationary bike no no yes     Swimming (no diving) no no no varies yes   Vacuuming, laundry, mopping no no no varies yes   Biking outdoors no no no no varies yes  Light jogging no no no varies yes   Low impact aerobics no no no varies yes   Non-contact sports (tennis, golf) no no no varies yes   Hunting (no tree climbing) no no no varies yes   Dancing (non-gymnastics) no no no varies yes   Down-hill skiing (experienced skier) no no no no yes   Down-hill skiing (novice) no no no no yes   Cross-country skiing no no no no yes   Horseback riding (noncompetitive)  no no no no yes   Horseback riding (competitive) no no no no varies yes  Gardening/landscaping no no no varies yes   House repairs no no no varies varies yes  Lifting up to 50 lbs no no no no varies yes    Restart Plavix Saturday morning Stop aspirin Call or return to ER for swelling or difficult breathing Ice to  incision to help reduce swelling

## 2016-06-14 NOTE — Progress Notes (Signed)
This note also relates to the following rows which could not be included: Pulse Rate - Cannot attach notes to unvalidated device data ECG Heart Rate - Cannot attach notes to unvalidated device data Resp - Cannot attach notes to unvalidated device data SpO2 - Cannot attach notes to unvalidated device data  Report given to taylor rn as cafregiver

## 2016-06-14 NOTE — Progress Notes (Signed)
Pt + MSSA PCR. Pt did not complete mupiricin treatment, nasal betadine given.

## 2016-06-14 NOTE — Brief Op Note (Signed)
06/14/2016  1:18 PM  PATIENT:  [name]  60 y.o. male  PRE-OPERATIVE DIAGNOSIS:  cervical myelopathy C4 -C5  POST-OPERATIVE DIAGNOSIS:  cervical myelopathy C4 -C5  PROCEDURE:  Procedure(s): ANTERIOR CERVICAL DECOMPRESSION/DISCECTOMY FUSION C4 - C5, CERVICAL 6-C7 2 LEVEL (N/A)  SURGEON:  Surgeon(s) and Role:    * Melina Schools, MD - Primary  PHYSICIAN ASSISTANT:   ASSISTANTS: none   ANESTHESIA:   general  EBL:  Total I/O In: 1000 [I.V.:1000] Out: 75 [Blood:75]  BLOOD ADMINISTERED:none  DRAINS: none   LOCAL MEDICATIONS USED:  MARCAINE     SPECIMEN:  No Specimen  DISPOSITION OF SPECIMEN:  N/A  COUNTS:  YES  TOURNIQUET:  * No tourniquets in log *  DICTATION: .Other Dictation: Dictation Number [id number]  PLAN OF CARE: Admit to inpatient   PATIENT DISPOSITION:  PACU - hemodynamically stable.

## 2016-06-14 NOTE — Anesthesia Preprocedure Evaluation (Addendum)
Anesthesia Evaluation  Patient identified by MRN, date of birth, ID band Patient awake    Reviewed: Allergy & Precautions, NPO status , Patient's Chart, lab work & pertinent test results, reviewed documented beta blocker date and time   Airway Mallampati: II  TM Distance: >3 FB Neck ROM: Full    Dental  (+) Teeth Intact, Dental Advisory Given   Pulmonary sleep apnea , former smoker,    breath sounds clear to auscultation       Cardiovascular hypertension, Pt. on medications and Pt. on home beta blockers + CAD, + Past MI, + Cardiac Stents and + CABG   Rhythm:Regular Rate:Normal     Neuro/Psych PSYCHIATRIC DISORDERS CVA    GI/Hepatic Neg liver ROS, GERD  ,  Endo/Other  negative endocrine ROS  Renal/GU negative Renal ROS  negative genitourinary   Musculoskeletal  (+) Arthritis ,   Abdominal   Peds negative pediatric ROS (+)  Hematology negative hematology ROS (+)   Anesthesia Other Findings Bilateral UE tingling/numbness at baseline  Reproductive/Obstetrics negative OB ROS                          Lab Results  Component Value Date   WBC 11.2 (H) 06/11/2016   HGB 13.5 06/11/2016   HCT 43.4 06/11/2016   MCV 94.6 06/11/2016   PLT 215 06/11/2016   Lab Results  Component Value Date   CREATININE 0.92 06/11/2016   BUN 15 06/11/2016   NA 143 06/11/2016   K 3.6 06/11/2016   CL 106 06/11/2016   CO2 27 06/11/2016   Lab Results  Component Value Date   INR 1.39 01/01/2014   INR 0.99 12/30/2013   INR 1.3 (H) 12/14/2013   03/2016 EKG: normal sinus rhythm.  03/2016 Echo - Left ventricle: The cavity size was normal. There was mild focal   basal hypertrophy of the septum. Systolic function was moderately   reduced. The estimated ejection fraction was in the range of 35%   to 40%. Dyskinesis and aneurysmal deformity of the   mid-apicalanteroseptal, inferior, inferoseptal, and apical  myocardium. Doppler parameters are consistent with abnormal left   ventricular relaxation (grade 1 diastolic dysfunction). Acoustic   contrast opacification revealed no evidence ofthrombus. - Left atrium: The atrium was mildly dilated. - Atrial septum: No defect or patent foramen ovale was identified.  Anesthesia Physical Anesthesia Plan  ASA: III  Anesthesia Plan: General   Post-op Pain Management:    Induction: Intravenous  Airway Management Planned: Oral ETT and Video Laryngoscope Planned  Additional Equipment:   Intra-op Plan:   Post-operative Plan: Extubation in OR  Informed Consent: I have reviewed the patients History and Physical, chart, labs and discussed the procedure including the risks, benefits and alternatives for the proposed anesthesia with the patient or authorized representative who has indicated his/her understanding and acceptance.   Dental advisory given  Plan Discussed with: CRNA  Anesthesia Plan Comments: (  Denies active cardiac symptoms.  Anesthetic plan discussed in detail. Associated risk discussed including but not limited to life threatening cardiovascular, pulmonary events and dental damage. The postoperative pain management and antiemetic plan discussed with patient. All questions answered in detail. Patient is in agreement.   )     Anesthesia Quick Evaluation

## 2016-06-14 NOTE — Op Note (Deleted)
  The note originally documented on this encounter has been moved the the encounter in which it belongs.  

## 2016-06-14 NOTE — Op Note (Signed)
NAMEEDRIK, STOOPS              ACCOUNT NO.:  [id number]  MEDICAL RECORD NO.:  [id number]  LOCATION:                               FACILITY:  [facility]  PHYSICIAN:  Hildred Mollica D. Rolena Infante, M.D. DATE OF BIRTH:  06/04/1956  DATE OF PROCEDURE:  06/14/2016 DATE OF DISCHARGE:  06/15/2016                              OPERATIVE REPORT   POSTOPERATIVE DIAGNOSES:  Cervical myelopathy, C5-6 and cervical radiculopathy, C4-5.  POSTOPERATIVE DIAGNOSES:  Cervical myelopathy, C5-6 and cervical radiculopathy, C4-5.  OPERATIVE PROCEDURE:  Anterior cervical diskectomy and fusion, C4 through C6.  COMPLICATIONS:  None.  INSTRUMENTATION SYSTEM USED:  DePuy Skyline anterior cervical plate with the Titan titanium intervertebral cages, nanoLOCK size 8, medium, packed with DBX mix.  COMPLICATIONS:  None.  Neuro monitoring throughout the case.  Evoked motor and sensory testing remained stable throughout with no fluctuations.  HISTORY:  This is a very pleasant 60 year old gentleman who has had a recent stroke and has subsequently recovered, but has had cervical myelopathy.  Imaging studies confirmed left foraminal stenosis causing C5 radiculopathy at the C4-5 level with cord signal changes at the C5-6 level from a hard disk osteophyte.  Attempts at conservative management were failing and his overall symptoms were intensified.  As a result, we elected to proceed with surgery.  All appropriate risks, benefits, and alternatives to surgery were discussed and consent was obtained.  OPERATIVE NOTE:  The patient was brought to the operating room and placed supine on the operating table.  After successful induction of general anesthesia, an endotracheal intubation, TEDs, SCDs were applied. The anterior cervical spine was prepped and draped in a standard fashion.  Time-out was taken to confirm patient, procedure, and all other pertinent important data.  Once that was completed, x-ray was used to identify the C5  vertebral body.  This was marked out and the incision site was infiltrated with 0.25% Marcaine.  A transverse incision was made, starting at the midline, proceeding to the left.  Sharp dissection was carried out down to and through the platysma.  I then dissected along the medial border of sternocleidomastoid, sweeping the omohyoid, esophagus, and trachea off to the right and protecting it with a Thyroid retractor.  I then dissected with Kittner dissectors through the remainder of the prevertebral fascia to expose the anterior longitudinal ligament.  A needle was placed into the 4-5 disk space.  I took an x-ray to confirm it was at the appropriate level.  Once this was done, I then mobilized the longus colli muscle with bipolar electrocautery is from the midbody of C4 to the midbody of C6.  Self-retaining retractors were then placed underneath the longus colli muscle.  The endotracheal cuff was deflated.  I expanded the retractor and reinflated the endotracheal cuff.  An annulotomy was performed at C5-6 level using a 15 blade scalpel.  Then, using pituitary rongeurs, I removed the bulk of the disk material.  I then placed distraction pins into the body of C5 and C6 and then distracted the intervertebral space.  I continued using curettes to remove the disc until I was at the posterior annulus.  There was significant bone spur formation, so I used a  high-speed bur along with a 1 mm Kerrison to remove the posterior osteophytes from C5 and C6.  I then used a fine nerve hook to gently dissect through the annulus and posterior longitudinal ligament and then resected the PLL with a 1 mm Kerrison.  I then undercut underneath the vertebral body of C6 and C5 to ensure an adequate decompression of the thecal sac.  I could easily now pass my nerve hook underneath the posterior aspect of the C5 and C6 vertebral bodies confirming adequate decompression.  I also had undercut the uncovertebral joints so  that the exiting nerve root was adequately decompressed.  At this point, I then rasped the endplates and made sure I had bleeding subchondral bone and then measured with the rasp trials. I then placed a size 8 Titan medium nanoLOCK cage packed with DBX mix. This got excellent purchase.  I then repositioned my distraction pins into the C4-5 level, and distracted the intervertebral space.  Using the same technique I had used on C5-6, I completed my diskectomy.  Again, I sacrificed the PLL for adequate decompression as well as decompressing the uncovertebral joints.  The endplates were rasped and I placed the same size cage with excellent fixation.  Retractors were removed.  I irrigated copiously normal saline and made sure, I had hemostasis using bipolar electrocautery and FloSeal.  I then obtained the anterior cervical plate and affixed it to the body of C4 with 16 mm self drilling screws and 14 mm screws into C5 and C6.  All screws had excellent purchase.  Once it was all completely tightened down, I then locked the screws to the plate according to manufacture's standards.  I then took my final x-rays, which were satisfactory.  Both planes, the hardware and graft were in good position.  I irrigated the wound copiously with normal saline, used bipolar electrocautery to ensure hemostasis.  I returned the trachea and esophagus to midline.  I closed the platysma with interrupted 2-0 Vicryl sutures and a 3-0 Monocryl for the skin.  Steri-Strips and dry dressing were applied, and the patient was ultimately extubated and transferred to the PACU without incident.  At the end of the case, all needle and sponge counts were correct.  There were no adverse intraoperative events.     Rowen Wilmer D. Rolena Infante, M.D.     DDB/MEDQ  D:  06/14/2016  T:  06/14/2016  Job:  [id number]

## 2016-06-15 ENCOUNTER — Encounter (HOSPITAL_COMMUNITY): Payer: Self-pay | Admitting: Orthopedic Surgery

## 2016-06-15 DIAGNOSIS — M50022 Cervical disc disorder at C5-C6 level with myelopathy: Secondary | ICD-10-CM | POA: Diagnosis not present

## 2016-06-15 NOTE — Evaluation (Signed)
Occupational Therapy Evaluation/Discharge Patient Details Name: [name] MRN: [id number] DOB: 09/26/1956 Today's Date: 06/15/2016    History of Present Illness Pt admitted for aCDF C4-5. PMHx: CVA 03/16/16, HTN, CAD, cardiomyopathy, carpal tunnel, DDD   Clinical Impression   PTA, pt was independent with ADLs and used RW for mobility. Pt currently presents with acute neck pain, generalized UE weakness, impaired L facial, hand and feet sensation, and weakness in L side from recent CVA. Pt required supervision for ADLs and basic transfers due to unsteady gait and balance deficits. Educated pt on cervical precautions, brace wear protocol, compensatory strategies for ADLs, and fall prevention strategies. Pt plans to d/c home with 24/7 assistance from his girlfriend and son. All education has been completed and pt has no further questions. Pt with no further acute OT needs. OT signing off.    Follow Up Recommendations  No OT follow up;Supervision - Intermittent    Equipment Recommendations  None recommended by OT    Recommendations for Other Services       Precautions / Restrictions Precautions Precautions: Cervical;Fall Precaution Comments: Pt able to recall 2/3 cervical precautions. Reviewed precautions and handout. Required Braces or Orthoses: Cervical Brace Cervical Brace: Hard collar Restrictions Weight Bearing Restrictions: No      Mobility Bed Mobility Overal bed mobility: Modified Independent             General bed mobility comments: Pt up in chair on OT arrival. Pt verbally demonstrated understanding of log roll technique.  Transfers Overall transfer level: Needs assistance Equipment used: Straight cane Transfers: Sit to/from Stand Sit to Stand: Supervision         General transfer comment: Supervision for safety. Pt demonstrates abnormal gait pattern from recent CVA that affected his L side. Pt reported no falls at home.    Balance Overall balance  assessment: Needs assistance Sitting-balance support: No upper extremity supported;Feet supported Sitting balance-Leahy Scale: Good     Standing balance support: Single extremity supported;During functional activity Standing balance-Leahy Scale: Fair Standing balance comment: Able to maintain balance with single extremity supported by [facility] or other surface, has LOB when balance is challenged                            ADL Overall ADL's : Needs assistance/impaired Eating/Feeding: Modified independent;Sitting   Grooming: Wash/dry hands;Wash/dry face;Supervision/safety;Standing   Upper Body Bathing: Supervision/ safety;Sitting   Lower Body Bathing: Supervison/ safety;Sit to/from stand Lower Body Bathing Details (indicate cue type and reason): educated on use of long-handled sponge Upper Body Dressing : Supervision/safety;Sitting   Lower Body Dressing: Supervision/safety;Sit to/from stand Lower Body Dressing Details (indicate cue type and reason): able to cross ankle-over-knee Toilet Transfer: Supervision/safety;Ambulation;Regular Toilet [facility])   Toileting- Clothing Manipulation and Hygiene: Supervision/safety;Sit to/from stand   Tub/ Shower Transfer: Walk-in shower;Supervision/safety;Ambulation [facility]) Tub/Shower Transfer Details (indicate cue type and reason): advised pt's girlfriend to be present for these transfers for first couple of weeks Functional mobility during ADLs: Supervision/safety;Cane General ADL Comments: Reviewed neck precautions, brace wear protcol, compensatory strategies for ADLs, and fall prevention strategies.     Vision Vision Assessment?: No apparent visual deficits   Perception     Praxis      Pertinent Vitals/Pain Pain Assessment: 0-10 Pain Score: 3  Pain Location: incision site Pain Descriptors / Indicators: Aching;Operative site guarding Pain Intervention(s): Limited activity within patient's tolerance;Monitored during  session;Repositioned;Ice applied;Premedicated before session     Hand Dominance Right  Extremity/Trunk Assessment Upper Extremity Assessment Upper Extremity Assessment: Generalized weakness   Lower Extremity Assessment Lower Extremity Assessment: LLE deficits/detail LLE Deficits / Details: decreased strength secondary to recent CVA   Cervical / Trunk Assessment Cervical / Trunk Assessment: Other exceptions Cervical / Trunk Exceptions: s/p cervical surgery   Communication Communication Communication: No difficulties   Cognition Arousal/Alertness: Awake/alert Behavior During Therapy: WFL for tasks assessed/performed Overall Cognitive Status: Within Functional Limits for tasks assessed                     General Comments       Exercises       Shoulder Instructions      Home Living Family/patient expects to be discharged to:: Private residence Living Arrangements: Spouse/significant other Available Help at Discharge: Family;Available 24 hours/day Type of Home: Mobile home Home Access: Stairs to enter Entrance Stairs-Number of Steps: 3 Entrance Stairs-Rails: Right;Left;Can reach both Home Layout: One level     Bathroom Shower/Tub: Walk-in shower;Door   [employer] Toilet: Standard     Home Equipment: Environmental consultant - 2 wheels;Cane - single point          Prior Functioning/Environment Level of Independence: Independent with assistive device(s)        Comments: has been using RW at home.     OT Diagnosis: Generalized weakness;Acute pain;Paresis   OT Problem List: Decreased strength;Decreased activity tolerance;Impaired balance (sitting and/or standing);Decreased coordination;Decreased safety awareness;Decreased knowledge of use of DME or AE;Decreased knowledge of precautions;Impaired sensation;Pain   OT Treatment/Interventions:      OT Goals(Current goals can be found in the care plan section) Acute Rehab OT Goals Patient Stated Goal: to be able to go  ocean fishing again soon OT Goal Formulation: With patient Time For Goal Achievement: 06/29/16 Potential to Achieve Goals: Good  OT Frequency:     Barriers to D/C:            Co-evaluation              End of Session Equipment Utilized During Treatment: Cervical collar;Other (comment) [facility]) Nurse Communication: Mobility status  Activity Tolerance: Patient tolerated treatment well Patient left: in chair;with call bell/phone within reach;with family/visitor present   Time: [id number] OT Time Calculation (min): 14 min Charges:  OT General Charges $OT Visit: 1 Procedure OT Evaluation $OT Eval Moderate Complexity: 1 Procedure G-Codes: OT G-codes **NOT FOR INPATIENT CLASS** Functional Assessment Tool Used: clinical judgement Functional Limitation: Self care Self Care Current Status [id number]): At least 1 percent but less than 20 percent impaired, limited or restricted Self Care Goal Status [id number]): At least 1 percent but less than 20 percent impaired, limited or restricted Self Care Discharge Status [phone]): At least 1 percent but less than 20 percent impaired, limited or restricted  Redmond Baseman, OTR/L Pager: [phone] 06/15/2016, 9:23 AM

## 2016-06-15 NOTE — Anesthesia Postprocedure Evaluation (Signed)
Anesthesia Post Note  Patient: [name]  Procedure(s) Performed: Procedure(s) (LRB): ANTERIOR CERVICAL DECOMPRESSION/DISCECTOMY FUSION C4 - C5, CERVICAL 6-C7 2 LEVEL (N/A)  Patient location during evaluation: PACU Anesthesia Type: General Level of consciousness: awake and alert Pain management: pain level controlled Vital Signs Assessment: post-procedure vital signs reviewed and stable Respiratory status: spontaneous breathing, nonlabored ventilation and respiratory function stable Cardiovascular status: blood pressure returned to baseline and stable Postop Assessment: no signs of nausea or vomiting Anesthetic complications: no    Last Vitals:  Vitals:   06/15/16 0811 06/15/16 1223  BP: (!) 159/92 (!) 164/83  Pulse: 76 67  Resp: 20 20  Temp: 36.8 C 36.9 C    Last Pain:  Vitals:   06/15/16 1223  TempSrc: Oral  PainSc:                  Clova Morlock A

## 2016-06-15 NOTE — Progress Notes (Signed)
Pt doing well. Pt and girlfriend given D/C instructions with Rx's, verbal understanding was provided. Pt's incision is clean and dry with no sign of infection. Pt's IV was removed prior to D/C. Pt D/C'd home via wheelchair @ 1555 per MD order. Pt is stable @ D/C and has no other needs at this time. Holli Humbles, RN

## 2016-06-15 NOTE — Evaluation (Signed)
Physical Therapy Evaluation/ Discharge Patient Details Name: [name] MRN: [id number] DOB: [birth year] Today's Date: 06/15/2016   History of Present Illness  Pt admitted for aCDF C4-5. PMHx: CVA 03/16/16, HTN, CAD, cardiomyopathy, carpal tunnel, DDD  Clinical Impression  Pt very pleasant, standing and walking on arrival. Pt and significant other educated for precautions, collar positioning and wear, mobility, dressing and progression. Pt with LLE weakness and deficit from prior CVA and was planning on OPPT to address these deficits which I continue to recommend. Pt educated for cane use and was able to demonstrate well with recommendation for continued use as pt has been carrying his RW at home. All questions answered and no further acute therapy needs at this time. Will sign off.     Follow Up Recommendations Outpatient PT    Equipment Recommendations  None recommended by PT    Recommendations for Other Services       Precautions / Restrictions Precautions Precautions: Cervical;Fall Required Braces or Orthoses: Cervical Brace Cervical Brace: Hard collar      Mobility  Bed Mobility Overal bed mobility: Modified Independent             General bed mobility comments: Educated for sequence and precautions with pt able to return demonstrate  Transfers Overall transfer level: Independent                  Ambulation/Gait Ambulation/Gait assistance: Supervision Ambulation Distance (Feet): 300 Feet Assistive device: Straight cane Gait Pattern/deviations: Step-through pattern;Decreased stance time - left   Gait velocity interpretation: Below normal speed for age/gender General Gait Details: cues for sequence, Cane use and safety  Stairs Stairs: Yes Stairs assistance: Supervision Stair Management: Step to pattern;Forwards;With cane;One rail Left Number of Stairs: 3 General stair comments: cues for sequence  Wheelchair Mobility    Modified Rankin (Stroke  Patients Only)       Balance Overall balance assessment: Needs assistance   Sitting balance-Leahy Scale: Good       Standing balance-Leahy Scale: Good                               Pertinent Vitals/Pain Pain Assessment: 0-10 Pain Score: 3  Pain Location: incision Pain Descriptors / Indicators: Aching Pain Intervention(s): Limited activity within patient's tolerance    Home Living Family/patient expects to be discharged to:: Private residence Living Arrangements: Spouse/significant other Available Help at Discharge: Family;Available 24 hours/day Type of Home: Mobile home Home Access: Stairs to enter Entrance Stairs-Rails: Right;Left;Can reach both Entrance Stairs-Number of Steps: 3 Home Layout: One level Home Equipment: Walker - 2 wheels;Cane - single point      Prior Function Level of Independence: Independent with assistive device(s)         Comments: has been using RW at home.      Hand Dominance   Dominant Hand: Right    Extremity/Trunk Assessment   Upper Extremity Assessment: Generalized weakness           Lower Extremity Assessment: LLE deficits/detail   LLE Deficits / Details: decreased strength secondary to recent CVA  Cervical / Trunk Assessment: Normal  Communication   Communication: No difficulties  Cognition Arousal/Alertness: Awake/alert Behavior During Therapy: WFL for tasks assessed/performed Overall Cognitive Status: Within Functional Limits for tasks assessed                      General Comments      Exercises  Assessment/Plan    PT Assessment All further PT needs can be met in the next venue of care  PT Diagnosis Abnormality of gait;Acute pain   PT Problem List Decreased activity tolerance;Decreased balance;Decreased coordination;Pain;Decreased knowledge of precautions  PT Treatment Interventions     PT Goals (Current goals can be found in the Care Plan section) Acute Rehab PT Goals Patient  Stated Goal: return to being independent    Frequency     Barriers to discharge        Co-evaluation               End of Session Equipment Utilized During Treatment: Cervical collar Activity Tolerance: Patient tolerated treatment well Patient left: in chair;with call bell/phone within reach;with nursing/sitter in room Nurse Communication: Mobility status;Precautions    Functional Assessment Tool Used: clinical judgement Functional Limitation: Mobility: Walking and moving around Mobility: Walking and Moving Around Current Status ([id number]): At least 1 percent but less than 20 percent impaired, limited or restricted Mobility: Walking and Moving Around Goal Status [phone]): At least 1 percent but less than 20 percent impaired, limited or restricted Mobility: Walking and Moving Around Discharge Status [phone]): At least 1 percent but less than 20 percent impaired, limited or restricted    Time: 0747-0803 PT Time Calculation (min) (ACUTE ONLY): 16 min   Charges:   PT Evaluation $PT Eval Low Complexity: 1 Procedure     PT G Codes:   PT G-Codes **NOT FOR INPATIENT CLASS** Functional Assessment Tool Used: clinical judgement Functional Limitation: Mobility: Walking and moving around Mobility: Walking and Moving Around Current Status ([id number]): At least 1 percent but less than 20 percent impaired, limited or restricted Mobility: Walking and Moving Around Goal Status [phone]): At least 1 percent but less than 20 percent impaired, limited or restricted Mobility: Walking and Moving Around Discharge Status [phone]): At least 1 percent but less than 20 percent impaired, limited or restricted    Melford Aase 06/15/2016, 9:04 AM  Elwyn Reach, [place]

## 2016-06-15 NOTE — Progress Notes (Signed)
    Subjective: Procedure(s) (LRB): ANTERIOR CERVICAL DECOMPRESSION/DISCECTOMY FUSION C4 - C5, CERVICAL 6-C7 2 LEVEL (N/A) 1 Day Post-Op  Patient reports pain as 2 on 0-10 scale.  Reports deferred at this time arm pain reports incisional neck pain   Positive void Negative bowel movement Positive flatus Negative chest pain or shortness of breath  Objective: Vital signs in last 24 hours: Temp:  [97.6 F (36.4 C)-98.3 F (36.8 C)] 97.7 F (36.5 C) (08/11 0400) Pulse Rate:  [71-92] 71 (08/11 0400) Resp:  [13-23] 20 (08/11 0400) BP: (151-180)/(90-107) 160/90 (08/11 0400) SpO2:  [92 %-100 %] 95 % (08/11 0400)  Intake/Output from previous day: 08/10 0701 - 08/11 0700 In: 2140 [P.O.:240; I.V.:1900] Out: 275 [Urine:200; Blood:75]  Labs: No results for input(s): WBC, RBC, HCT, PLT in the last 72 hours. No results for input(s): NA, K, CL, CO2, BUN, CREATININE, GLUCOSE, CALCIUM in the last 72 hours. No results for input(s): LABPT, INR in the last 72 hours.  Physical Exam: ABD soft Intact pulses distally Incision: dressing C/D/I Compartment soft no swelling, no difficulty breathing, no dysphonia Neuro exam improved.  No radicular arm pain, improved stability when walking  Assessment/Plan: Patient stable  xrays satisfactory Mobilization with physical therapy Encourage incentive spirometry Continue care  Advance diet Up with therapy  Possible d/c this afternoon if he continues to do well.   Will speak with family as patient will need close monitoring after restarting Plavix tomorrow    Melina Schools, MD [place] [phone]

## 2016-06-22 ENCOUNTER — Ambulatory Visit
Admission: RE | Admit: 2016-06-22 | Discharge: 2016-06-22 | Disposition: A | Discharge status: Home / Self Care | Payer: PPO | Source: Ambulatory Visit | Attending: Neurology | Admitting: Neurology

## 2016-06-22 DIAGNOSIS — I663 Occlusion and stenosis of cerebellar arteries: Secondary | ICD-10-CM

## 2016-06-22 DIAGNOSIS — I7774 Dissection of vertebral artery: Secondary | ICD-10-CM

## 2016-06-22 DIAGNOSIS — I63233 Cerebral infarction due to unspecified occlusion or stenosis of bilateral carotid arteries: Secondary | ICD-10-CM | POA: Diagnosis not present

## 2016-06-22 MED ORDER — IOPAMIDOL (ISOVUE-370) INJECTION 76%
80.0000 mL | Freq: Once | INTRAVENOUS | Status: AC | PRN
  Administered 2016-06-22: 80 mL via INTRAVENOUS

## 2016-06-25 ENCOUNTER — Telehealth: Payer: Self-pay | Admitting: *Deleted

## 2016-06-25 NOTE — Telephone Encounter (Signed)
January is fine thanks

## 2016-06-25 NOTE — Telephone Encounter (Signed)
-----   Message from Melvenia Beam, MD sent at 06/24/2016  8:13 PM EDT ----- [name], patient had a vertebral dissection which caused his stroke, but that has resolved. Restored patency of the left vertebral artery which now appears normal throughout its course. Restored patency of the left PICA origin. Thanks

## 2016-06-25 NOTE — Telephone Encounter (Signed)
Called and spoke to pt. Advised he can keep f/u that is scheduled. No need to come in sooner unless he has new/worsening sx. Then he can call and we can get him in sooner. He verbalized understanding.

## 2016-06-25 NOTE — Telephone Encounter (Signed)
Dr Jaynee Eagles- please advise  Called and spoke to pt about CT results per Dr Jaynee Eagles. He verbalized understanding.  He is wondering if he needs to f/u sooner than 09/11/16. Advised I will ask Dr Jaynee Eagles and call back and let him know. He verbalized understanding.

## 2016-06-28 NOTE — Discharge Summary (Signed)
Physician Discharge Summary  Patient ID: [name] MRN: [id number] DOB/AGE: [birth year] 60 y.o.  Admit date: 06/14/2016 Discharge date: 06/28/2016  Admission Diagnoses:  Cervical DDD  Discharge Diagnoses:  Active Problems:   Myelopathy [facility])   Past Medical History:  Diagnosis Date  . Arthritis   . CAD (coronary artery disease)    a. s/p prior MI; hx of stent to RCA and LAD;  b. LHC 12/2006:  LM 20%, pLAD 40%, mLAD stent ok, apical LAD 90% (1.5-10mm vessel), oD1 40-50%, AV groove CFX 30%, oOM1 30%, pRCA 30% (multiple), mRCA stent ok with 30-40% ISR, dRCA 30%, dAnt, inf-apical severe HK, EF 40% => med Rx.;  12/2013 Cath: LM nl, LAD 95ost, 56m ISR, 99apex, LCX nl, RCA 40p, 100 ISR, L->R collats, EF 35-40%-->pending CABG  . Dental bridge present    lower  . GERD (gastroesophageal reflux disease)    occ  . Hx of cardiovascular stress test    a.  Myoview (02/2010): EF 41%, septal, apical and inf infarct, no ischemia;   b.  Lexiscan Myoview (12/02/13): Large fixed perfusion defect in the RCA and LAD territory, no ischemia, EF 40%.  . Hypertension    under control with med., has been on med. > 12 yr.  . Ischemic cardiomyopathy    a. Echo 09/2012:  Mod LVH, focal basal hypertrophy, EF 30-35%, mid to dist inf-lat HK, Gr 1 diast dysfn, mild to mod LAE  . Mixed hyperlipidemia   . Myocardial infarction ([place])    x2  . 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 Sleep apnea    not using cpap at present had new test and getting new cpap  . Status post placement of implantable loop recorder   . Stroke ([place]) 03/2016  . Tobacco abuse     Surgeries: Procedure(s): ANTERIOR CERVICAL DECOMPRESSION/DISCECTOMY FUSION C4 - C5, CERVICAL 6-C7 2 LEVEL on 06/14/2016   Consultants (if any):   Discharged Condition: Improved  Hospital Course: [name] is an 60 y.o. male who was admitted 06/14/2016 with a diagnosis of Cerviacl DDD and went to the operating room on 06/14/2016 and underwent  the above named procedures.  Pt pain is well controlled on oral meds. Urinating w/o difficulty. Positive flatus.  denied arm pain. Reports incisional arm pain.  Cleared by PT. Ambulating well. LLE weakness present from prior CT  He was given perioperative antibiotics:  Anti-infectives    Start     Dose/Rate Route Frequency Ordered Stop   06/14/16 1800  ceFAZolin (ANCEF) IVPB 2g/100 mL premix     2 g 200 mL/hr over 30 Minutes Intravenous Every 8 hours 06/14/16 1527 06/15/16 0309   06/14/16 0800  ceFAZolin (ANCEF) IVPB 2g/100 mL premix     2 g 200 mL/hr over 30 Minutes Intravenous To [facility] 06/13/16 1347 06/14/16 1006    .  He was given sequential compression devices, early ambulation, and TED for DVT prophylaxis.  He benefited maximally from the hospital stay and there were no complications.    Recent vital signs:  Vitals:   06/15/16 0811 06/15/16 1223  BP: (!) 159/92 (!) 164/83  Pulse: 76 67  Resp: 20 20  Temp: 98.3 F (36.8 C) 98.4 F (36.9 C)    Recent laboratory studies:  Lab Results  Component Value Date   HGB 13.5 06/11/2016   HGB 18.7 (H) 03/16/2016   HGB 17.1 (H) 03/16/2016   Lab Results  Component Value Date   WBC 11.2 (H) 06/11/2016  PLT 215 06/11/2016   Lab Results  Component Value Date   INR 1.39 01/01/2014   Lab Results  Component Value Date   NA 143 06/11/2016   K 3.6 06/11/2016   CL 106 06/11/2016   CO2 27 06/11/2016   BUN 15 06/11/2016   CREATININE 0.92 06/11/2016   GLUCOSE 134 (H) 06/11/2016    Discharge Medications:     Medication List    STOP taking these medications   aspirin 81 MG EC tablet   multivitamin with minerals tablet     TAKE these medications   atorvastatin 40 MG tablet Commonly known as:  LIPITOR Take 1 tablet (40 mg total) by mouth daily at 6 PM.   carvedilol 3.125 MG tablet Commonly known as:  COREG Take 1 tablet (3.125 mg total) by mouth daily.   clopidogrel 75 MG tablet Commonly known as:   PLAVIX TAKE 1 TABLET BY MOUTH EVERY DAY   losartan 100 MG tablet Commonly known as:  COZAAR Take 1 tablet (100 mg total) by mouth daily.   methocarbamol 500 MG tablet Commonly known as:  ROBAXIN Take 1 tablet (500 mg total) by mouth 3 (three) times daily as needed for muscle spasms.   metoprolol tartrate 25 MG tablet Commonly known as:  LOPRESSOR Take 0.5 tablets (12.5 mg total) by mouth 2 (two) times daily.   ondansetron 4 MG tablet Commonly known as:  ZOFRAN Take 1 tablet (4 mg total) by mouth every 8 (eight) hours as needed for nausea or vomiting.   oxyCODONE-acetaminophen 10-325 MG tablet Commonly known as:  PERCOCET Take 1 tablet by mouth every 4 (four) hours as needed for pain.       Diagnostic Studies: Ct Angio Head W Or Wo Contrast  Result Date: 06/22/2016 CLINICAL DATA:  60 year old male with left vertebral artery occlusion and left PICA infarcts in May. Cervical spine fusion 8 days ago. Subsequent encounter. EXAM: CT ANGIOGRAPHY HEAD AND NECK TECHNIQUE: Multidetector CT imaging of the head and neck was performed using the standard protocol during bolus administration of intravenous contrast. Multiplanar CT image reconstructions and MIPs were obtained to evaluate the vascular anatomy. Carotid stenosis measurements (when applicable) are obtained utilizing NASCET criteria, using the distal internal carotid diameter as the denominator. CONTRAST:  80 mL Isovue 370. COMPARISON:  Postoperative cervical spine radiographs 06/14/2016. CTA head and neck 03/17/2016, brain MRI 03/16/2016. FINDINGS: CT HEAD Brain: Supratentorial gray-white matter differentiation remains normal. Subtle left lateral medullary and left cerebellar encephalomalacia related to the ischemia 3 months ago. No intracranial mass effect. No ventriculomegaly. No acute intracranial hemorrhage identified. Calvarium and skull base: Stable. Paranasal sinuses: Visualized paranasal sinuses and mastoids are stable and well  pneumatized. Orbits: Stable and negative orbit and scalp soft tissues. CTA NECK Skeleton: Partially visible median sternotomy. Cervical ACDF hardware from C4 to the C6 level. Hardware appears intact without evidence of loosening. No acute osseous abnormality identified. Other neck: Negative lung apices. No superior mediastinal lymphadenopathy. Negative larynx, pharynx, superior parapharyngeal spaces, sublingual space, submandibular glands and parotid glands. Postoperative changes tracking from the skin surface to the prevertebral soft tissues just superior to the left thyroid lobe. There is a partially rim enhancing simple fluid collection tracking from between the left carotid space and left thyroid space into the prevertebral space. The most confluent component of this collection encompasses 32 x 40 x 47 mm (AP by transverse by CC). There is mild mass effect on the left thyroid lobe which otherwise appears negative. The right thyroid  is negative. There is mild lateral mass effect on the left carotid space. There is associated retropharyngeal/ prevertebral effusion tracking cephalad to the C2-C3 level. No gas within the collection. Mild subcutaneous stranding and skin thickening at the incision site. No associated lymphadenopathy. Stable cervical lymph nodes. Incidental posterior cervical dermis associated sebaceous type cyst again noted (series 5, image 74). Aortic arch: 3 vessel arch configuration again noted with stable mild to moderate soft and calcified arch atherosclerosis. Right carotid system: Stable right CCA without stenosis. Stable soft and calcified plaque at the right carotid bifurcation and right ICA bulb without hemodynamically significant stenosis. Stable cervical right ICA. Left carotid system: Stable left CCA without stenosis. Stable minimal plaque at the left carotid bifurcation. Stable cervical left ICA. Vertebral arteries:New line no proximal subclavian artery stenosis. Stable atherosclerosis  at both vertebral artery origins which remain patent. The right vertebral artery is stable and patent to the vertebrobasilar junction. The right PICA origin remains patent. The left vertebral artery V1 segment is stable and normal. The previously-seen irregularity in the left V2 segment has resolved. Mild tortuosity in that segment is noted, but otherwise the vessel is normal. The left V3 and V4 segments now are patent. The left PICA origin is now patent. CTA HEAD Posterior circulation: Patent in normal appearing distal vertebral arteries. Both PICA origins now are patent. Patent vertebrobasilar junction. Stable basilar artery without stenosis. Normal SCA and PCA origins. Posterior communicating arteries are diminutive or absent. Normal bilateral PCA branches. Anterior circulation: Stable ICA siphons with distal cavernous and supraclinoid segment calcified plaque. Both ophthalmic artery origins remain normal. Stable and normal carotid termini. Normal MCA and ACA origins. Anterior communicating artery and bilateral ACA branches remain normal. Bilateral MCA branches remain normal. Venous sinuses: Patent. Anatomic variants: None. Delayed phase: No abnormal enhancement identified. IMPRESSION: 1. Restored patency of the left vertebral artery which now appears normal throughout its course. Restored patency of the left PICA origin. 2. Otherwise stable arterial CTA Head and Neck findings since 03/17/2016. Atherosclerosis without significant stenosis. 3. Interval cervical ACDF with prevertebral and left paraspinal partially rim enhancing fluid collection measuring up to 4.7 cm. Favor postoperative seroma. Mild mass effect on the left thyroid and carotid space. 4. ACDF hardware intact with no adverse features. 5. Subtle encephalomalacia corresponding to the left PICA infarcts which occurred in May. No new intracranial abnormality. Electronically Signed   By: Genevie Ann M.D.   On: 06/22/2016 11:35   Dg Cervical Spine 2 Or 3  Views  Result Date: 06/14/2016 CLINICAL DATA:  Status post anterior cervical disc fusion at C4-C5 and C6-C7. EXAM: CERVICAL SPINE - 2-3 VIEW COMPARISON:  Cervical CT, 07/10/2012 FINDINGS: AP and lateral images show placement of an anterior fusion plate and associated fixation screws at C4, C5 and C6. There are metallic spacers maintaining disc height at diffuse levels. The orthopedic hardware appears well seated and well positioned. Bones are demineralized. There are carotid calcifications bilaterally. IMPRESSION: Well-positioned orthopedic hardware following anterior cervical disc fusion from C4 through C6. Electronically Signed   By: Lajean Manes M.D.   On: 06/14/2016 16:29   Dg Cervical Spine 2-3 Views  Result Date: 06/14/2016 CLINICAL DATA:  ACDF EXAM: DG C-ARM 61-120 MIN; CERVICAL SPINE - 2-3 VIEW COMPARISON:  03/17/2016 CT angiogram of the neck. FINDINGS: Fluoroscopy time 0 minutes 24 seconds. Three spot fluoroscopic nondiagnostic intraoperative radiographs of the cervical spine demonstrate postsurgical changes from ACDF in the C4-C6 cervical spine. Tube is seen in the pharynx entering  the upper airway. IMPRESSION: Intraoperative fluoroscopic guidance for ACDF. Electronically Signed   By: Ilona Sorrel M.D.   On: 06/14/2016 13:44   Ct Angio Neck W Or Wo Contrast  Result Date: 06/22/2016 CLINICAL DATA:  60 year old male with left vertebral artery occlusion and left PICA infarcts in May. Cervical spine fusion 8 days ago. Subsequent encounter. EXAM: CT ANGIOGRAPHY HEAD AND NECK TECHNIQUE: Multidetector CT imaging of the head and neck was performed using the standard protocol during bolus administration of intravenous contrast. Multiplanar CT image reconstructions and MIPs were obtained to evaluate the vascular anatomy. Carotid stenosis measurements (when applicable) are obtained utilizing NASCET criteria, using the distal internal carotid diameter as the denominator. CONTRAST:  80 mL Isovue 370.  COMPARISON:  Postoperative cervical spine radiographs 06/14/2016. CTA head and neck 03/17/2016, brain MRI 03/16/2016. FINDINGS: CT HEAD Brain: Supratentorial gray-white matter differentiation remains normal. Subtle left lateral medullary and left cerebellar encephalomalacia related to the ischemia 3 months ago. No intracranial mass effect. No ventriculomegaly. No acute intracranial hemorrhage identified. Calvarium and skull base: Stable. Paranasal sinuses: Visualized paranasal sinuses and mastoids are stable and well pneumatized. Orbits: Stable and negative orbit and scalp soft tissues. CTA NECK Skeleton: Partially visible median sternotomy. Cervical ACDF hardware from C4 to the C6 level. Hardware appears intact without evidence of loosening. No acute osseous abnormality identified. Other neck: Negative lung apices. No superior mediastinal lymphadenopathy. Negative larynx, pharynx, superior parapharyngeal spaces, sublingual space, submandibular glands and parotid glands. Postoperative changes tracking from the skin surface to the prevertebral soft tissues just superior to the left thyroid lobe. There is a partially rim enhancing simple fluid collection tracking from between the left carotid space and left thyroid space into the prevertebral space. The most confluent component of this collection encompasses 32 x 40 x 47 mm (AP by transverse by CC). There is mild mass effect on the left thyroid lobe which otherwise appears negative. The right thyroid is negative. There is mild lateral mass effect on the left carotid space. There is associated retropharyngeal/ prevertebral effusion tracking cephalad to the C2-C3 level. No gas within the collection. Mild subcutaneous stranding and skin thickening at the incision site. No associated lymphadenopathy. Stable cervical lymph nodes. Incidental posterior cervical dermis associated sebaceous type cyst again noted (series 5, image 74). Aortic arch: 3 vessel arch configuration  again noted with stable mild to moderate soft and calcified arch atherosclerosis. Right carotid system: Stable right CCA without stenosis. Stable soft and calcified plaque at the right carotid bifurcation and right ICA bulb without hemodynamically significant stenosis. Stable cervical right ICA. Left carotid system: Stable left CCA without stenosis. Stable minimal plaque at the left carotid bifurcation. Stable cervical left ICA. Vertebral arteries:New line no proximal subclavian artery stenosis. Stable atherosclerosis at both vertebral artery origins which remain patent. The right vertebral artery is stable and patent to the vertebrobasilar junction. The right PICA origin remains patent. The left vertebral artery V1 segment is stable and normal. The previously-seen irregularity in the left V2 segment has resolved. Mild tortuosity in that segment is noted, but otherwise the vessel is normal. The left V3 and V4 segments now are patent. The left PICA origin is now patent. CTA HEAD Posterior circulation: Patent in normal appearing distal vertebral arteries. Both PICA origins now are patent. Patent vertebrobasilar junction. Stable basilar artery without stenosis. Normal SCA and PCA origins. Posterior communicating arteries are diminutive or absent. Normal bilateral PCA branches. Anterior circulation: Stable ICA siphons with distal cavernous and supraclinoid segment calcified  plaque. Both ophthalmic artery origins remain normal. Stable and normal carotid termini. Normal MCA and ACA origins. Anterior communicating artery and bilateral ACA branches remain normal. Bilateral MCA branches remain normal. Venous sinuses: Patent. Anatomic variants: None. Delayed phase: No abnormal enhancement identified. IMPRESSION: 1. Restored patency of the left vertebral artery which now appears normal throughout its course. Restored patency of the left PICA origin. 2. Otherwise stable arterial CTA Head and Neck findings since 03/17/2016.  Atherosclerosis without significant stenosis. 3. Interval cervical ACDF with prevertebral and left paraspinal partially rim enhancing fluid collection measuring up to 4.7 cm. Favor postoperative seroma. Mild mass effect on the left thyroid and carotid space. 4. ACDF hardware intact with no adverse features. 5. Subtle encephalomalacia corresponding to the left PICA infarcts which occurred in May. No new intracranial abnormality. Electronically Signed   By: Genevie Ann M.D.   On: 06/22/2016 11:35   Dg C-arm 61-120 Min  Result Date: 06/14/2016 CLINICAL DATA:  ACDF EXAM: DG C-ARM 61-120 MIN; CERVICAL SPINE - 2-3 VIEW COMPARISON:  03/17/2016 CT angiogram of the neck. FINDINGS: Fluoroscopy time 0 minutes 24 seconds. Three spot fluoroscopic nondiagnostic intraoperative radiographs of the cervical spine demonstrate postsurgical changes from ACDF in the C4-C6 cervical spine. Tube is seen in the pharynx entering the upper airway. IMPRESSION: Intraoperative fluoroscopic guidance for ACDF. Electronically Signed   By: Ilona Sorrel M.D.   On: 06/14/2016 13:44    Disposition: 01-Home or Self Care Pt will report to clinic in 2 weeks Aspen collar in place for 2 weeks   Follow-up Information    BROOKS,DAHARI D, MD. Schedule an appointment as soon as possible for a visit in 2 weeks.   Specialty:  Orthopedic Surgery Why:  For wound re-check, For suture removal, If symptoms worsen Contact information: [address] [place] [address] [id number]            Signed: Valinda Hoar 06/28/2016, 11:10 AM

## 2016-07-04 ENCOUNTER — Ambulatory Visit (INDEPENDENT_AMBULATORY_CARE_PROVIDER_SITE_OTHER): Payer: PPO | Admitting: *Deleted

## 2016-07-04 DIAGNOSIS — L989 Disorder of the skin and subcutaneous tissue, unspecified: Secondary | ICD-10-CM | POA: Diagnosis not present

## 2016-07-04 DIAGNOSIS — I63212 Cerebral infarction due to unspecified occlusion or stenosis of left vertebral arteries: Secondary | ICD-10-CM | POA: Diagnosis not present

## 2016-07-04 DIAGNOSIS — R05 Cough: Secondary | ICD-10-CM | POA: Diagnosis not present

## 2016-07-04 DIAGNOSIS — I517 Cardiomegaly: Secondary | ICD-10-CM | POA: Diagnosis not present

## 2016-07-04 DIAGNOSIS — J069 Acute upper respiratory infection, unspecified: Secondary | ICD-10-CM | POA: Diagnosis not present

## 2016-07-04 NOTE — Progress Notes (Signed)
[facility] Summary Report / Loop Recorder 

## 2016-07-06 DIAGNOSIS — Z981 Arthrodesis status: Secondary | ICD-10-CM | POA: Diagnosis not present

## 2016-07-11 ENCOUNTER — Telehealth: Payer: Self-pay | Admitting: Cardiovascular Disease

## 2016-07-11 MED ORDER — AMLODIPINE BESYLATE 10 MG PO TABS: 10.0000 mg | ORAL_TABLET | Freq: Every day | ORAL | 3 refills | Status: DC

## 2016-07-11 NOTE — Telephone Encounter (Signed)
Pt c/o BP issue: STAT if pt c/o blurred vision, one-sided weakness or slurred speech  1. What are your last 5 BP readings?175/135   2. Are you having any other symptoms (ex. Dizziness, headache, blurred vision, passed out)? Headaches   3. What is your BP issue? Blood pressure is up in the mornings and come down just a little in the afternoons , not getting down to where Dr.  Johnsie Cancel wants it to be

## 2016-07-11 NOTE — Telephone Encounter (Signed)
Have him tak norvasc 10 mg at bed time

## 2016-07-11 NOTE — Telephone Encounter (Signed)
Called patient informed him of Dr. Johnsie Cancel recommendations. Per Dr. Johnsie Cancel take Norvasc 10 mg at bed time. Patient verbalized understanding. Sent prescription in to patient's pharmacy of choice.

## 2016-07-11 NOTE — Telephone Encounter (Signed)
Called patient back about his BP 175/135. Patient stated this was his BP this morning before he took his BP medications. Asked patient to take his BP right now, BP 124/92 HR 74. Patient stated that his BP is always better in the afternoon. Patient denies any pain, except for a slight headache. Patient stated he feels fine. Informed patient that Dr. Johnsie Cancel will be consulted and we will see if he needs to increase his metoprolol or coreg. Will forward to Dr. Johnsie Cancel.

## 2016-07-17 DIAGNOSIS — G4733 Obstructive sleep apnea (adult) (pediatric): Secondary | ICD-10-CM | POA: Diagnosis not present

## 2016-07-26 ENCOUNTER — Encounter: Payer: Self-pay | Admitting: Cardiovascular Disease

## 2016-07-28 LAB — CUP PACEART REMOTE DEVICE CHECK: Date Time Interrogation Session: 20170830170729

## 2016-07-28 NOTE — Progress Notes (Signed)
[facility] summary report received. Battery status OK. Normal device function. No new symptom episodes, tachy episodes, brady, or pause episodes. No new AF episodes. Monthly summary reports and ROV/PRN 

## 2016-08-03 ENCOUNTER — Ambulatory Visit (INDEPENDENT_AMBULATORY_CARE_PROVIDER_SITE_OTHER): Payer: PPO | Admitting: *Deleted

## 2016-08-03 ENCOUNTER — Ambulatory Visit: Payer: BLUE CROSS/BLUE SHIELD | Admitting: Cardiovascular Disease

## 2016-08-03 DIAGNOSIS — I63212 Cerebral infarction due to unspecified occlusion or stenosis of left vertebral arteries: Secondary | ICD-10-CM

## 2016-08-03 DIAGNOSIS — M50022 Cervical disc disorder at C5-C6 level with myelopathy: Secondary | ICD-10-CM | POA: Diagnosis not present

## 2016-08-03 NOTE — Progress Notes (Signed)
[facility] Summary Report / Loop Recorder 

## 2016-08-12 NOTE — Progress Notes (Signed)
Patient ID: [name], male   DOB: [birth year], 60 y.o.   MRN: [id number]   60 y.o. male with history of CAD s/p bare metal stent to RCA in 2002 and 2008 and bare metal stent LAD in 2002 and 2008, ICM (EF ~ 40%), HTN, HL, OSA, GERD, tobacco abuse 2015 stress myoview with anterior and inferior wall scar with reduced LVEF, no clear ischemia. Cardiac cath done 12/22/13 with progressive disease and CABG recommended   CABG 01/01/14  with Isaac [name] This included LIMA to LAD and SVG to PDA EF 30-35% by echo   Has not smoked since surgery   Echo 05/27/14 EF 35-40%  MUGA  06/10/14  EF 43% 10/26/15  ABI's normal    Seein by [name] and felt to be not a candidate for CRT or AICD   03/15/16  Presenting with new onset vertigo and slurred speech as well as unstable gait. He is also had noticeable difficulty with swallowing. Onset of symptoms was at 5 PM on 03/15/2016. He has no previous history of stroke nor TIA. He's been taking aspirin daily. MRI of the brain showed acute left PICA distribution infarcts involving cerebellum and left lateral medulla. 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 There was mild focal  basal hypertrophy of the septum. Systolic function was moderately  reduced. The estimated ejection fraction was in the range of 35%  to 40%. Dyskinesis and aneurysmal deformity of the  mid-apicalanteroseptal, inferior, inferoseptal, and apical  myocardium. Doppler parameters are consistent with abnormal left  ventricular relaxation (grade 1 diastolic dysfunction). Acoustic  contrast opacification revealed no evidence ofthrombus. - Left atrium: The atrium was mildly dilated. - Atrial  septum: No defect or patent foramen ovale was identified.  Impressions:  - Although no LV apical thrombus is seen curremtly, the apical  dyskinesis appears to be a high risk finding for apical  thrombosis and embolic events.  Having trouble with balance and walking still Starting in house PT this week.  Still smoking a bit  Referred to EP for ILR to r/o occult PAF Inserted by [name] 04/05/16    Reviewed device with [name]  No alarms   BP was high in mornings Tried to give him norvasc but now with Gill edema Discussed stopping this and plavix since stroke over 3 months ago and he bruises easily  ROS: Denies fever, malais, weight loss, blurry vision, decreased visual acuity, cough, sputum, SOB, hemoptysis, pleuritic pain, palpitaitons, heartburn, abdominal pain, melena, lower extremity edema, claudication, or rash.  All other systems reviewed and negative  General: Affect appropriate Healthy:  appears stated age 60: normal Neck supple with no adenopathy JVP normal no bruits no thyromegaly Lungs clear with no wheezing and good diaphragmatic motion Heart:  S1/S2 no murmur, no rub, gallop or click PMI normal  ILR subQ left chest  Abdomen: benighn, BS positve, no tenderness, no AAA no bruit.  No HSM or HJR Distal pulses intact with no bruits No edema Neuro non-focal Skin warm and dry No muscular weakness   Current Outpatient Prescriptions  Medication Sig Dispense Refill  . amLODipine (NORVASC) 10 MG tablet Take 5 mg by mouth daily.    [name]  atorvastatin (LIPITOR) 40 MG tablet Take 1 tablet (40 mg total) by mouth daily at 6 PM. 90 tablet 3  . carvedilol (COREG) 3.125 MG tablet Take 1 tablet (3.125 mg total) by mouth daily. 90 tablet 3  . clopidogrel (PLAVIX) 75 MG tablet TAKE 1 TABLET BY MOUTH EVERY DAY 30 tablet 11  . losartan (COZAAR) 100 MG tablet Take 1 tablet (100 mg total) by mouth daily. 90 tablet 3  . methocarbamol (ROBAXIN) 500 MG tablet Take 1 tablet (500 mg total)  by mouth 3 (three) times daily as needed for muscle spasms. 60 tablet 0  . metoprolol tartrate (LOPRESSOR) 25 MG tablet Take 0.5 tablets (12.5 mg total) by mouth 2 (two) times daily. 45 tablet 3  . ondansetron (ZOFRAN) 4 MG tablet Take 1 tablet (4 mg total) by mouth every 8 (eight) hours as needed for nausea or vomiting. 20 tablet 0  . oxyCODONE-acetaminophen (PERCOCET) 10-325 MG tablet Take 1 tablet by mouth every 4 (four) hours as needed for pain. 60 tablet 0   No current facility-administered medications for this visit.     Allergies  Hydrocodone  Electrocardiogram:  06/24/14  SR rate 62 anterolateral MI with persistent lateral T wave inversions  QRS normal    07/07/15 SR ate 73  Anterolateral T wave changes persist   Assessment and Plan Cerebellar Stroke:   PT important D/C smoking important will call chantix in .  Loop recorder inserted  To r/o PAF but also had vertebral artery dissection  ILR no alarms reviewed one false afib recording was sinus He will check signal strength for box at home to see if he needs repeater to tansmit  D/C plavix   CAD: Stable with no angina and good activity level.  Continue lopressor and aSA  DCM  euvolemic functional class one continue current meds EF 43% MUGA 2015  Chol:  Lab Results  Component Value Date   LDLCALC 118 (H) 03/17/2016   lasbs with [name] HTN: Well controlled.  Taking norvasc at bedtime now   Carpel Tunnel:  Vs cervical spine disease with see [name] to get MRI of c spine   Back Pain:  F/u [name] for injection.   Doing PT  F/u [name]   Edema:  Doubt cardiac stop norvasc PRN diuretic called in f/u Gill venous duplex    [name]

## 2016-08-13 ENCOUNTER — Ambulatory Visit (INDEPENDENT_AMBULATORY_CARE_PROVIDER_SITE_OTHER): Payer: PPO | Admitting: Cardiovascular Disease

## 2016-08-13 ENCOUNTER — Encounter (INDEPENDENT_AMBULATORY_CARE_PROVIDER_SITE_OTHER): Payer: Self-pay

## 2016-08-13 ENCOUNTER — Encounter: Payer: Self-pay | Admitting: Cardiovascular Disease

## 2016-08-13 VITALS — BP 102/60 | HR 86 | Ht 68.0 in | Wt 174.8 lb

## 2016-08-13 DIAGNOSIS — R600 Localized edema: Secondary | ICD-10-CM | POA: Diagnosis not present

## 2016-08-13 DIAGNOSIS — I429 Cardiomyopathy, unspecified: Secondary | ICD-10-CM | POA: Diagnosis not present

## 2016-08-13 DIAGNOSIS — I2510 Atherosclerotic heart disease of native coronary artery without angina pectoris: Secondary | ICD-10-CM

## 2016-08-13 MED ORDER — HYDROCHLOROTHIAZIDE 12.5 MG PO CAPS: 12.5000 mg | ORAL_CAPSULE | Freq: Every day | ORAL | 3 refills | Status: DC

## 2016-08-13 NOTE — Addendum Note (Signed)
Addended by: Aris Georgia, Argenis Kumari L on: 08/13/2016 09:48 AM   Modules accepted: Orders

## 2016-08-13 NOTE — Patient Instructions (Addendum)
Medication Instructions:  Your physician has recommended you make the following change in your medication:  1-STOP Norvasc 2-STOP Plavix 3-START Hydroclorithizide 12.5 mg by mouth daily   Labwork: NONE  Testing/Procedures: Your physician has requested that you have a lower extremity arterial exercise duplex as soon as possible. During this test, exercise and ultrasound are used to evaluate arterial blood flow in the legs. Allow one hour for this exam. There are no restrictions or special instructions.  Follow-Up: Your physician wants you to follow-up in: 3 months or next available with Dr. Johnsie Cancel.    If you need a refill on your cardiac medications before your next appointment, please call your pharmacy.

## 2016-08-14 ENCOUNTER — Ambulatory Visit (HOSPITAL_COMMUNITY)
Admission: RE | Admit: 2016-08-14 | Discharge: 2016-08-14 | Disposition: A | Discharge status: Home / Self Care | Payer: PPO | Source: Ambulatory Visit | Attending: Cardiovascular Disease | Admitting: Cardiovascular Disease

## 2016-08-14 DIAGNOSIS — R600 Localized edema: Secondary | ICD-10-CM | POA: Insufficient documentation

## 2016-08-16 DIAGNOSIS — G4733 Obstructive sleep apnea (adult) (pediatric): Secondary | ICD-10-CM | POA: Diagnosis not present

## 2016-08-17 DIAGNOSIS — M50021 Cervical disc disorder at C4-C5 level with myelopathy: Secondary | ICD-10-CM | POA: Diagnosis not present

## 2016-08-21 ENCOUNTER — Telehealth: Payer: Self-pay | Admitting: Neurology

## 2016-08-21 NOTE — Telephone Encounter (Signed)
I've sent a note to Dr. Johnsie Cancel to ask about this.

## 2016-08-21 NOTE — Telephone Encounter (Signed)
Patient is calling. Last week he saw his heart doctor Dr. Liliane Channel and was told by him to discontinue Plavix. He did stop taking Plavix yesterday. The patient thought he was supposed to take this medication to prevent a stroke. Please call and discuss.

## 2016-08-21 NOTE — Telephone Encounter (Signed)
Dr Felecia Shelling- patient last saw AA, MD on 05/15/16. She did mention patient staying on plavix in last office note. You are listed as WID this pm. Please advise.

## 2016-08-22 DIAGNOSIS — M50021 Cervical disc disorder at C4-C5 level with myelopathy: Secondary | ICD-10-CM | POA: Diagnosis not present

## 2016-08-22 NOTE — Telephone Encounter (Signed)
LVM on home phone. Reached patient on mobile phone and advised him, per Dr Kyla Balzarine staff message, that he may stop Plavix but must take Aspirin 81 mg tablet once a day. Patient stated he is taking ASA 81 mg daily. Patient verbalized understanding, agreement to  Continue taking ASA 81 mg daily. He verbalized appreciation for call.

## 2016-08-24 ENCOUNTER — Telehealth: Payer: Self-pay | Admitting: *Deleted

## 2016-08-24 NOTE — Telephone Encounter (Signed)
Per Dr Felecia Shelling, spoke with patient and confirmed he is taking ASA 81 mg daily, and he is not taking Plavix. Advised him that per Dr Felecia Shelling,  he must take one or the other. He verbalized understanding, agreement, appreciation.

## 2016-09-03 ENCOUNTER — Ambulatory Visit (INDEPENDENT_AMBULATORY_CARE_PROVIDER_SITE_OTHER): Payer: PPO | Admitting: *Deleted

## 2016-09-03 DIAGNOSIS — I63212 Cerebral infarction due to unspecified occlusion or stenosis of left vertebral arteries: Secondary | ICD-10-CM | POA: Diagnosis not present

## 2016-09-04 NOTE — Progress Notes (Signed)
[facility] Summary Report / Loop Recorder 

## 2016-09-11 ENCOUNTER — Ambulatory Visit (INDEPENDENT_AMBULATORY_CARE_PROVIDER_SITE_OTHER): Payer: PPO | Admitting: Neurology

## 2016-09-11 ENCOUNTER — Other Ambulatory Visit: Payer: Self-pay | Admitting: *Deleted

## 2016-09-11 ENCOUNTER — Encounter: Payer: Self-pay | Admitting: Neurology

## 2016-09-11 VITALS — BP 108/64 | HR 81 | Resp 20 | Ht 68.0 in | Wt 178.0 lb

## 2016-09-11 DIAGNOSIS — I63112 Cerebral infarction due to embolism of left vertebral artery: Secondary | ICD-10-CM | POA: Diagnosis not present

## 2016-09-11 MED ORDER — HYDROCHLOROTHIAZIDE 12.5 MG PO CAPS: 12.5000 mg | ORAL_CAPSULE | Freq: Every day | ORAL | 3 refills | Status: DC

## 2016-09-11 NOTE — Patient Instructions (Signed)
Remember to drink plenty of fluid, eat healthy meals and do not skip any meals. Try to eat protein with a every meal and eat a healthy snack such as fruit or nuts in between meals. Try to keep a regular sleep-wake schedule and try to exercise daily, particularly in the form of walking, 20-30 minutes a day, if you can.   I would like to see you back in 1 year, sooner if we need to. Please call us with any interim questions, concerns, problems, updates or refill requests.   Our phone number is [phone]. We also have an after hours call service for urgent matters and there is a physician on-call for urgent questions. For any emergencies you know to call 911 or go to the nearest emergency room   

## 2016-09-11 NOTE — Progress Notes (Signed)
[id number]    Provider:  Dr Jaynee Eagles Referring Provider: Stephens Shire, MD Primary Care Physician:  Stephens Shire, MD  CC:  stroke 03/17/2016  Interval history 09/11/2016: He quit smoking, he vapes. His girlfriend smokes. He has gained weight he is about 180, inactivity and quitting smoking and neck pain (recent surgery) and lumbar degenerative disease. He is improving, he still has some dizziness which is occasional. He is doing well. He needs back surgery. Discussed dissection, he also has a loop recorder. At this point he is doing well.    HPI:  Ericka Antosh is a 60 y.o. male here as a referral from Dr. Tollie Pizza for left vertebral artery occlusion. Acute left PICA distribution infarction affecting the left lateral medulla and areas of the left cerebellum. Pavan Stopka is an 60 y.o. male history of hypertension, hyperlipidemia, coronary artery disease and arthritis, presented with new onset vertigo and slurred speech as well as unstable gait. He is also had noticeable difficulty with swallowing. Onset of symptoms was at 5 PM on 03/15/2016. He had no previous history of stroke nor TIA. He was taking aspirin daily. MRI of the brain showed acute left PICA distribution infarcts involving cerebellum and left lateral medulla. 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 His fiance is noted slight droop of the face on the left vision is noted mild sensory changes involving the left side of his face and left hand. NIH stroke score was 3.  He returns today for follow up with his fiance. He has participated in home PT. He had a loop recorder placed. He finished physical therapy. He has had 2 falls because he was not using the walker. He was scheduled for surgery with Dr, Rolena Infante. He is on Asa 81mg  and Plavix. In early August will stop dual anti-platelet and just stay on Plavix. He is still dizzy and walks to the left. Smells heavily of smoke today but says that is his fiance. No  vertigo. No slurred speech. Vision is blurry in the left eye and wants to water. No problems swallowing. Left face is numb. He has swelling in the feet. He was scheduled for cervical spine surgery when this happened. He has severe OSA and hasn't had a sleep test in 15 years and hasn;t used the cpap in at least that long  Reviewed notes, labs and imaging from outside physicians, which showed: Ct Head Wo Contrast 03/16/2016  Mild right ethmoid sinus disease. No intracranial mass hemorrhage, or focal gray - white compartment lesions/ acute appearing infarct.   Mr Jodene Nam Head Wo Contrast 03/16/2016  Left vertebral artery occlusion. Acute left PICA distribution infarction affecting the left lateral medulla and areas of the left cerebellum. No swelling or hemorrhage. The remainder the brain is normal. Normal MR venography.     Mr Mrv Head Wo Cm 03/16/2016  Left vertebral artery occlusion. Acute left PICA distribution infarction affecting the left lateral medulla and areas of the left cerebellum. No swelling or hemorrhage. The remainder the brain is normal. Normal MR venography.  IMPRESSION: CT HEAD: 2 small LEFT cerebellar infarcts without further propagation. Otherwise negative CT HEAD with and without contrast.  CTA NECK: LEFT proximal V2 segment probable dissection, and occlusion of distal LEFT V2 segment.  Patulous esophagus with air-fluid level, aspiration risk.  Severe bilateral C5-6 neural foraminal narrowing.  CTA HEAD: Occluded LEFT vertebral artery and LEFT posterior inferior cerebellar artery. Minimal retrograde flow into distal LEFT V4 segment.  Per cardiology notes: 60 y.o. male  with history of CAD s/p bare metal stent to RCA in 2002 and 2008 and bare metal stent LAD in 2002 and 2008, ICM (EF ~ 40%), HTN, HL, OSA, GERD, tobacco abuse 2015 stress myoview with anterior and inferior wall scar with reduced LVEF, no clear ischemia. Cardiac cath done 12/22/13 with  progressive disease and CABG recommended   CABG 01/01/14 with Dr Cyndia Bent This included LIMA to LAD and SVG to PDA EF 30-35% by echo  Has not smoked since surgery   Echo 05/27/14 EF 35-40%  MUGA 06/10/14 EF 43% 10/26/15 ABI's normal    Review of Systems: Patient complains of symptoms per HPI as well as the following symptoms: Frequency of urination, urgency, back pain, walking difficulty, bruise bleed easily, dizziness, numbness, weakness, tremors, leg swelling . Pertinent negatives per HPI. All others negative.  Social History   Social History  . Marital status: Married    Spouse name: [name]  . Number of children: 2  . Years of education: 12   Occupational History  . Disabled     Social History Main Topics  . Smoking status: Former Smoker    Packs/day: 1.00    Years: 20.00    Types: Cigarettes    Quit date: 03/16/2016  . Smokeless tobacco: Never Used  . Alcohol use 0.0 oz/week     Comment: occasional-none since 5/17  . Drug use: No  . Sexual activity: Not on file   Other Topics Concern  . Not on file   Social History Narrative   Lives with [name]   Caffeine use: Drinks coffee/tea/soda (2 cups coffee/morning)    Family History  Problem Relation Age of Onset  . Heart disease Father   . Heart disease Maternal Grandmother   . Heart disease Maternal Grandfather   . Heart disease Paternal Grandmother   . Heart disease Paternal Grandfather     Past Medical History:  Diagnosis Date  . Arthritis   . CAD (coronary artery disease)    a. s/p prior MI; hx of stent to RCA and LAD;  b. LHC 12/2006:  LM 20%, pLAD 40%, mLAD stent ok, apical LAD 90% (1.5-44mm vessel), oD1 40-50%, AV groove CFX 30%, oOM1 30%, pRCA 30% (multiple), mRCA stent ok with 30-40% ISR, dRCA 30%, dAnt, inf-apical severe HK, EF 40% => med Rx.;  12/2013 Cath: LM nl, LAD 95ost, 18m ISR, 99apex, LCX nl, RCA 40p, 100 ISR, L->R collats, EF 35-40%-->pending CABG  . Dental bridge present    lower  . GERD  (gastroesophageal reflux disease)    occ  . Hx of cardiovascular stress test    a.  Myoview (02/2010): EF 41%, septal, apical and inf infarct, no ischemia;   b.  Lexiscan Myoview (12/02/13): Large fixed perfusion defect in the RCA and LAD territory, no ischemia, EF 40%.  . Hypertension    under control with med., has been on med. > 12 yr.  . Ischemic cardiomyopathy    a. Echo 09/2012:  Mod LVH, focal basal hypertrophy, EF 30-35%, mid to dist inf-lat HK, Gr 1 diast dysfn, mild to mod LAE  . Mixed hyperlipidemia   . Myocardial infarction    x2  . 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 Sleep apnea    not using cpap at present had new test and getting new cpap  . Status post placement of implantable loop recorder   . Stroke ([place]) 03/2016  . Tobacco abuse     Past Surgical History:  Procedure Laterality Date  . ANTERIOR CERVICAL DECOMP/DISCECTOMY FUSION N/A 06/14/2016   Procedure: ANTERIOR CERVICAL DECOMPRESSION/DISCECTOMY FUSION C4 - C5, CERVICAL 6-C7 2 LEVEL;  Surgeon: Melina Schools, MD;  Location: [facility];  Service: Orthopedics;  Laterality: N/A;  . CARDIAC CATHETERIZATION  06/27/2001; 12/13/2006; 02/20/2010  . CORONARY ANGIOPLASTY WITH STENT PLACEMENT  05/22/2001   PCI - RCA - LAD  . CORONARY ANGIOPLASTY WITH STENT PLACEMENT  11/11/2006   PCI - BMS - LAD  . CORONARY ANGIOPLASTY WITH STENT PLACEMENT  11/15/2006   PCI - BMS - RCA  . CORONARY ANGIOPLASTY WITH STENT PLACEMENT  03/16/2010  . CORONARY ARTERY BYPASS GRAFT N/A 01/01/2014   Procedure: Coronary artery bypass graft times two using left internal mammary artery and left leg greater saphenous vein harvested endoscopically.;  Surgeon: Gaye Pollack, MD;  Location: [facility] OR;  Service: Open Heart Surgery;  Laterality: N/A;  . EP IMPLANTABLE DEVICE N/A 04/05/2016   Procedure: Loop Recorder Insertion;  Surgeon: Evans Lance, MD;  Location: [facility];  Service: Cardiovascular;  Laterality: N/A;  . FOOT SURGERY Right     achilles tendon reattached  . HAND SURGERY Right    screws  . INTRAOPERATIVE TRANSESOPHAGEAL ECHOCARDIOGRAM N/A 01/01/2014   Procedure: INTRAOPERATIVE TRANSESOPHAGEAL ECHOCARDIOGRAM;  Surgeon: Gaye Pollack, MD;  Location[facility];  Service: Open Heart Surgery;  Laterality: N/A;  . KNEE ARTHROSCOPY     left knee x 1, right knee x 2  . LEFT HEART CATHETERIZATION WITH CORONARY ANGIOGRAM N/A 12/21/2013   Procedure: LEFT HEART CATHETERIZATION WITH CORONARY ANGIOGRAM;  Surgeon: Burnell Blanks, MD;  Location: [facility] CATH LAB;  Service: Cardiovascular;  Laterality: N/A;  . OPEN REDUCTION INTERNAL FIXATION (ORIF) SCAPHOID WITH ILIAC CREST BONE GRAFT  10/15/2012   Procedure: OPEN REDUCTION INTERNAL FIXATION (ORIF) SCAPHOID WITH ILIAC CREST BONE GRAFT;  Surgeon: Schuyler Amor, MD;  Location: [facility] [facility];  Service: Orthopedics;  Laterality: Right;  No iliac creast bone graft taken, used graft from radius    Current Outpatient Prescriptions  Medication Sig Dispense Refill  . atorvastatin (LIPITOR) 40 MG tablet Take 1 tablet (40 mg total) by mouth daily at 6 PM. 90 tablet 3  . carvedilol (COREG) 3.125 MG tablet Take 1 tablet (3.125 mg total) by mouth daily. 90 tablet 3  . hydrochlorothiazide (MICROZIDE) 12.5 MG capsule Take 1 capsule (12.5 mg total) by mouth daily. 30 capsule 3  . losartan (COZAAR) 100 MG tablet Take 1 tablet (100 mg total) by mouth daily. 90 tablet 3  . methocarbamol (ROBAXIN) 500 MG tablet Take 1 tablet (500 mg total) by mouth 3 (three) times daily as needed for muscle spasms. 60 tablet 0  . metoprolol tartrate (LOPRESSOR) 25 MG tablet Take 0.5 tablets (12.5 mg total) by mouth 2 (two) times daily. 45 tablet 3  . ondansetron (ZOFRAN) 4 MG tablet Take 1 tablet (4 mg total) by mouth every 8 (eight) hours as needed for nausea or vomiting. 20 tablet 0   No current facility-administered medications for this visit.     Allergies as of 09/11/2016 - Review Complete  09/11/2016  Allergen Reaction Noted  . Hydrocodone  03/16/2016    Vitals: BP 108/64   Pulse 81   Resp 20   Ht 5\' 8"  (1.727 m)   Wt 178 lb (80.7 kg)   BMI 27.06 kg/m  Last Weight:  Wt Readings from Last 1 Encounters:  09/11/16 178 lb (80.7 kg)   Last Height:  Ht Readings from Last 1 Encounters:  09/11/16 5\' 8"  (1.727 m)      Neuro: Detailed Neurologic Exam  Speech:  Speech is normal; fluent and spontaneous with normal comprehension.  Cognition:  The patient is oriented to person, place, and time;  Cranial Nerves:  The pupils are round, and reactive to light, left miosis. The fundi are normal and spontaneous venous pulsations are present. Visual fields are full to finger confrontation. Extraocular movements are intact. Trigeminal sensation is decreased in the left lower face intact and the muscles of mastication are normal. Mild left NL flattening otherwise the face is symmetric. The palate elevates in the midline. Hearing intact. Voice is normal. Shoulder shrug is normal. The tongue has normal motion without fasciculations.   Coordination:  Very mild dysmetria LUE, LLE intact, significantly improved.  Motor Observation:  No asymmetry, no atrophy, and no involuntary movements noted. Tone:  Normal muscle tone.   Strength: strength is V/V in the upper and lower limbs.    Reflex Exam:     Assessment/Plan:  ASSESSMENT/PLAN Mr. Hager Zima is a 60 y.o. male with history of hyperlipidemia, hypertension, obstructive sleep apnea, ischemic cardiomyopathy, coronary artery disease with previous MI, and history of tobacco use presented in 03/17/2016 with new-onset vertigo, slurred speech, left facial droop, mild sensory changes of the left face and hand, unsteady gait, and difficulty swallowing. He did not receive IV t-PA due to late presentation.   Stroke: Dominant infarcts from embolic source vertebral dissection vs emboli  Resultant Dizziness,  left dysmetria, improving  MRI - Acute left PICA distribution infarction affecting the left lateral medulla and areas of the left cerebellum.  MRA - Left vertebral artery occlusion vs dissection.   CTA of head and neck - LEFT proximal V2 segment probable dissection, vertebral artery restored patency in 06/2016   occlusion of distal LEFT V2 segment   Loop recorder inserted   Carotid Doppler - Atherosclerosis without significant stenosis.  2D Echo follows with cardiology   LDL 118 - continue statin   HgbA1c 5.1   Patient counseled to be compliant with his antithrombotic medications ASA 81mg   Ongoing aggressive stroke risk factor management  His cardiologist placed him on asa81 and he will continue this.   Hypertension  Controlled, follows with Dr. Johnsie Cancel  Hyperlipidemia    Lipitor 40mg , continue  Other Stroke Risk Factors  Advanced age  Cigarette smoker,quit smoking  ETOH decreased, maybe 2 a month   Coronary artery disease: Follows with Dr. Johnsie Cancel  Obstructive sleep apnea, using his cpap now discussed risk of stroke  Other Active Problems  Polycythemia    Lumbar spine needs surgery:  Explained risks vs benefit. While he is off of his ASA, he is at higher risk for stroke. It is at Dr. Valla Leaver discretion if patient can stay on a baby aspirin which will provide some protection but may also increase the risk of bleeding during surgery. If he needs to have the surgery he understands that he is at elevated risk for stroke especially while off of his ASA. Patient and fiance acknowledge risks. Dr. Rolena Infante is an excellent surgeon, he will be in good hands. Discussed today with patient.    Will need physical therapy for balance an gait after surgery  CC: Dr. Tollie Pizza, Dr. Sedalia Muta, MD  [facility] [address] [place], [place] [address]  Phone [phone] Fax [phone]  A total of 30 minutes was spent  face-to-face with this patient. Over  half this time was spent on counseling patient on the left PICA distribution infarction affecting the left lateral medulla and areas of the left cerebellum.   diagnosis and different diagnostic and therapeutic options available.

## 2016-09-14 DIAGNOSIS — M50021 Cervical disc disorder at C4-C5 level with myelopathy: Secondary | ICD-10-CM | POA: Diagnosis not present

## 2016-09-14 LAB — CUP PACEART REMOTE DEVICE CHECK
Date Time Interrogation Session: 20170929181000
Implantable Pulse Generator Implant Date: 20170601

## 2016-09-14 NOTE — Progress Notes (Signed)
[facility] summary report received. Battery status OK. Normal device function. No new symptom episodes, tachy episodes, brady, or pause episodes. No new AF episodes. Monthly summary reports and ROV/PRN 

## 2016-09-15 ENCOUNTER — Other Ambulatory Visit: Payer: Self-pay | Admitting: Cardiovascular Disease

## 2016-09-16 DIAGNOSIS — G4733 Obstructive sleep apnea (adult) (pediatric): Secondary | ICD-10-CM | POA: Diagnosis not present

## 2016-09-18 ENCOUNTER — Other Ambulatory Visit: Payer: Self-pay | Admitting: Cardiovascular Disease

## 2016-09-19 NOTE — Telephone Encounter (Signed)
metoprolol tartrate (LOPRESSOR) 25 MG tablet  Medication  Date: 09/17/2016 Department: [address] Office Ordering/Authorizing: Josue Hector, MD  Order Providers   Prescribing Provider Encounter Provider  Josue Hector, MD Josue Hector, MD  Medication Detail    Disp Refills Start End   metoprolol tartrate (LOPRESSOR) 25 MG tablet 30 tablet 11 09/17/2016    Sig: TAKE 1/2 TABLET TWICE DAILY   E-Prescribing Status: Receipt confirmed by pharmacy (09/17/2016 12:33 PM EST)   Pharmacy   [id number] - [place], [place] - [address]

## 2016-09-20 DIAGNOSIS — R39198 Other difficulties with micturition: Secondary | ICD-10-CM | POA: Diagnosis not present

## 2016-09-20 DIAGNOSIS — J069 Acute upper respiratory infection, unspecified: Secondary | ICD-10-CM | POA: Diagnosis not present

## 2016-10-02 ENCOUNTER — Ambulatory Visit (INDEPENDENT_AMBULATORY_CARE_PROVIDER_SITE_OTHER): Payer: PPO | Admitting: *Deleted

## 2016-10-02 DIAGNOSIS — I63212 Cerebral infarction due to unspecified occlusion or stenosis of left vertebral arteries: Secondary | ICD-10-CM

## 2016-10-03 NOTE — Progress Notes (Signed)
[facility] Summary Report / Loop Recorder 

## 2016-10-08 LAB — CUP PACEART REMOTE DEVICE CHECK
Date Time Interrogation Session: 20171029183719
Implantable Pulse Generator Implant Date: 20170601

## 2016-10-08 NOTE — Progress Notes (Signed)
[facility] summary report received. Battery status OK. Normal device function. No new symptom episodes, tachy episodes, brady, or pause episodes. No new AF episodes. Monthly summary reports and ROV/PRN 

## 2016-10-16 DIAGNOSIS — G4733 Obstructive sleep apnea (adult) (pediatric): Secondary | ICD-10-CM | POA: Diagnosis not present

## 2016-10-31 ENCOUNTER — Ambulatory Visit: Payer: PPO | Admitting: Neurology

## 2016-10-31 ENCOUNTER — Telehealth: Payer: Self-pay

## 2016-10-31 NOTE — Telephone Encounter (Signed)
I called pt. Dr. Rexene Alberts will be unable to see him at 11:30am today for his first cpap follow up. Dr. Rexene Alberts can see him at 8:00am next week. Pt is agreeable to an 8:00am appt with Dr. Rexene Alberts on Thursdsay, 11/08/16. Pt verbalized understanding of new appt, date and time.

## 2016-11-01 ENCOUNTER — Ambulatory Visit (INDEPENDENT_AMBULATORY_CARE_PROVIDER_SITE_OTHER): Payer: PPO | Admitting: *Deleted

## 2016-11-01 DIAGNOSIS — I63212 Cerebral infarction due to unspecified occlusion or stenosis of left vertebral arteries: Secondary | ICD-10-CM | POA: Diagnosis not present

## 2016-11-02 NOTE — Progress Notes (Signed)
[facility] Summary Report / Loop Recorder 

## 2016-11-06 ENCOUNTER — Encounter: Payer: Self-pay | Admitting: Neurology

## 2016-11-07 DIAGNOSIS — M5136 Other intervertebral disc degeneration, lumbar region: Secondary | ICD-10-CM | POA: Diagnosis not present

## 2016-11-08 ENCOUNTER — Ambulatory Visit (INDEPENDENT_AMBULATORY_CARE_PROVIDER_SITE_OTHER): Payer: PPO | Admitting: Neurology

## 2016-11-08 ENCOUNTER — Encounter: Payer: Self-pay | Admitting: Neurology

## 2016-11-08 VITALS — BP 132/68 | HR 80 | Resp 20 | Ht 68.0 in | Wt 185.0 lb

## 2016-11-08 DIAGNOSIS — F17200 Nicotine dependence, unspecified, uncomplicated: Secondary | ICD-10-CM

## 2016-11-08 DIAGNOSIS — M7989 Other specified soft tissue disorders: Secondary | ICD-10-CM | POA: Diagnosis not present

## 2016-11-08 DIAGNOSIS — G4733 Obstructive sleep apnea (adult) (pediatric): Secondary | ICD-10-CM

## 2016-11-08 DIAGNOSIS — Z8673 Personal history of transient ischemic attack (TIA), and cerebral infarction without residual deficits: Secondary | ICD-10-CM

## 2016-11-08 DIAGNOSIS — Z9989 Dependence on other enabling machines and devices: Secondary | ICD-10-CM | POA: Diagnosis not present

## 2016-11-08 NOTE — Patient Instructions (Addendum)
Please continue using your CPAP regularly. While your insurance requires that you use CPAP at least 4 hours each night on 70% of the nights, I recommend, that you not skip any nights and use it throughout the night if you can. Getting used to CPAP and staying with the treatment long term does take time and patience and discipline. Untreated obstructive sleep apnea when it is moderate to severe can have an adverse impact on cardiovascular health and raise her risk for heart disease, arrhythmias, hypertension, congestive heart failure, stroke and diabetes. Untreated obstructive sleep apnea causes sleep disruption, nonrestorative sleep, and sleep deprivation. This can have an impact on your day to day functioning and cause daytime sleepiness and impairment of cognitive function, memory loss, mood disturbance, and problems focussing. Using CPAP regularly can improve these symptoms.  We will do a 2 month check up with Isaac Blossom, NP. We are increasing your CPAP pressure to 10 cm.  Keep using it every night, all night, and please quit smoking.   I will see you back after that.
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
## 2016-11-12 LAB — CUP PACEART REMOTE DEVICE CHECK
Date Time Interrogation Session: 20171128191228
Implantable Pulse Generator Implant Date: 20170601

## 2016-11-13 DIAGNOSIS — M4686 Other specified inflammatory spondylopathies, lumbar region: Secondary | ICD-10-CM | POA: Diagnosis not present

## 2016-11-13 DIAGNOSIS — S32040S Wedge compression fracture of fourth lumbar vertebra, sequela: Secondary | ICD-10-CM | POA: Diagnosis not present

## 2016-11-13 DIAGNOSIS — M5136 Other intervertebral disc degeneration, lumbar region: Secondary | ICD-10-CM | POA: Diagnosis not present

## 2016-11-13 DIAGNOSIS — M48062 Spinal stenosis, lumbar region with neurogenic claudication: Secondary | ICD-10-CM | POA: Diagnosis not present

## 2016-11-16 ENCOUNTER — Ambulatory Visit: Payer: Self-pay | Admitting: Physician Assistant

## 2016-11-16 DIAGNOSIS — I10 Essential (primary) hypertension: Secondary | ICD-10-CM | POA: Diagnosis not present

## 2016-11-16 DIAGNOSIS — K219 Gastro-esophageal reflux disease without esophagitis: Secondary | ICD-10-CM | POA: Diagnosis not present

## 2016-11-16 DIAGNOSIS — G4733 Obstructive sleep apnea (adult) (pediatric): Secondary | ICD-10-CM | POA: Diagnosis not present

## 2016-11-16 DIAGNOSIS — E782 Mixed hyperlipidemia: Secondary | ICD-10-CM | POA: Diagnosis not present

## 2016-11-16 DIAGNOSIS — Z01818 Encounter for other preprocedural examination: Secondary | ICD-10-CM | POA: Diagnosis not present

## 2016-11-16 DIAGNOSIS — I429 Cardiomyopathy, unspecified: Secondary | ICD-10-CM | POA: Diagnosis not present

## 2016-11-16 DIAGNOSIS — N400 Enlarged prostate without lower urinary tract symptoms: Secondary | ICD-10-CM | POA: Insufficient documentation

## 2016-11-16 DIAGNOSIS — I639 Cerebral infarction, unspecified: Secondary | ICD-10-CM | POA: Diagnosis not present

## 2016-11-16 DIAGNOSIS — I519 Heart disease, unspecified: Secondary | ICD-10-CM | POA: Diagnosis not present

## 2016-11-19 NOTE — Progress Notes (Signed)
Patient ID: [name], male   DOB: 20-Oct-1956, 60 y.o.   MRN: [id number]   61 y.o. male with history of CAD s/p bare metal stent to RCA in 2002 and 2008 and bare metal stent LAD in 2002 and 2008, ICM (EF ~ 40%), HTN, HL, OSA, GERD, tobacco abuse 2015 stress myoview with anterior and inferior wall scar with reduced LVEF, no clear ischemia. Cardiac cath done 12/22/13 with progressive disease and CABG recommended   CABG 01/01/14  with [name] This included LIMA to LAD and SVG to PDA EF 30-35% by echo   Has not smoked since surgery   Echo 05/27/14 EF 35-40%  MUGA  06/10/14  EF 43% 10/26/15  ABI's normal    Seein by [name] and felt to be not a candidate for CRT or AICD   03/15/16  Presenting with new onset vertigo and slurred speech as well as unstable gait. He is also had noticeable difficulty with swallowing. Onset of symptoms was at 5 PM on 03/15/2016. He has no previous history of stroke nor TIA. He's been taking aspirin daily. MRI of the brain showed acute left PICA distribution infarcts involving cerebellum and left lateral medulla. 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 There was mild focal  basal hypertrophy of the septum. Systolic function was moderately  reduced. The estimated ejection fraction was in the range of 35%  to 40%. Dyskinesis and aneurysmal deformity of the  mid-apicalanteroseptal, inferior, inferoseptal, and apical  myocardium. Doppler parameters are consistent with abnormal left  ventricular relaxation (grade 1 diastolic dysfunction). Acoustic  contrast opacification revealed no evidence ofthrombus. - Left atrium: The atrium was mildly dilated. - Atrial  septum: No defect or patent foramen ovale was identified.  Impressions:  - Although no LV apical thrombus is seen curremtly, the apical  dyskinesis appears to be a high risk finding for apical  thrombosis and embolic events.  Having trouble with balance and walking still Starting in house PT this week.  Still smoking a bit  Referred to EP for ILR to r/o occult PAF Inserted by [name] 04/05/16    Reviewed device with [name]  No alarms   Edema with norvasc. Stopped and started on diuretic for BP Last visit Plavix stopped after 3 months as etiology of CVA thought To be dissection and patient had easy bruising.    Needs surgery with [name] L5-S1 decompression   ROS: Denies fever, malais, weight loss, blurry vision, decreased visual acuity, cough, sputum, SOB, hemoptysis, pleuritic pain, palpitaitons, heartburn, abdominal pain, melena, lower extremity edema, claudication, or rash.  All other systems reviewed and negative  General: Affect appropriate Healthy:  appears stated age 89: normal Neck supple with no adenopathy JVP normal no bruits no thyromegaly Lungs clear with no wheezing and good diaphragmatic motion Heart:  S1/S2 no murmur, no rub, gallop or click PMI normal  ILR subQ left chest  Abdomen: benighn, BS positve, no tenderness, no AAA no bruit.  No HSM or HJR Distal pulses intact with no bruits No edema Neuro non-focal Skin warm and dry No muscular weakness   Current Outpatient Prescriptions  Medication Sig Dispense Refill  . atorvastatin (LIPITOR)  40 MG tablet Take 1 tablet (40 mg total) by mouth daily at 6 PM. 90 tablet 3  . carvedilol (COREG) 3.125 MG tablet Take 1 tablet (3.125 mg total) by mouth daily. 90 tablet 3  . hydrochlorothiazide (MICROZIDE) 12.5 MG capsule Take 1 capsule (12.5 mg total) by mouth daily. 90 capsule 3  . losartan (COZAAR) 100 MG tablet Take 1 tablet (100 mg total) by mouth daily. 90 tablet 3  . metoprolol tartrate (LOPRESSOR) 25  MG tablet Take 12.5 mg by mouth 2 (two) times daily.     No current facility-administered medications for this visit.     Allergies  Hydrocodone  Electrocardiogram:  06/24/14  SR rate 62 anterolateral MI with persistent lateral T wave inversions  QRS normal    07/07/15 SR ate 73  Anterolateral T wave changes persist   Assessment and Plan Cerebellar Stroke:   PT important D/C smoking important will call chantix in .  Loop recorder inserted  To r/o PAF but also had vertebral artery dissection  ILR no alarms reviewed one false afib recording was sinus He will check signal strength for box at home to see if he needs repeater to tansmit  D/C plavix   CAD: Stable with no angina and good activity level.  Continue lopressor and aSA  DCM  euvolemic functional class one continue current meds EF 43% MUGA 2015  Chol:  Lab Results  Component Value Date   LDLCALC 118 (H) 03/17/2016   lasbs with [name] HTN:  Carpel Tunnel:  Vs cervical spine disease with see [name] to get MRI of c spine   Back Pain:    F/u [name] to have laminectomy surgery  Edema:  Better off norvasc and with diuretic    [name]

## 2016-11-20 ENCOUNTER — Ambulatory Visit (INDEPENDENT_AMBULATORY_CARE_PROVIDER_SITE_OTHER): Payer: PPO | Admitting: Cardiovascular Disease

## 2016-11-20 ENCOUNTER — Encounter: Payer: Self-pay | Admitting: Cardiovascular Disease

## 2016-11-20 ENCOUNTER — Encounter (INDEPENDENT_AMBULATORY_CARE_PROVIDER_SITE_OTHER): Payer: Self-pay

## 2016-11-20 VITALS — BP 110/60 | HR 75 | Ht 68.0 in | Wt 183.8 lb

## 2016-11-20 DIAGNOSIS — I429 Cardiomyopathy, unspecified: Secondary | ICD-10-CM

## 2016-11-20 NOTE — Patient Instructions (Signed)

## 2016-11-27 DIAGNOSIS — S32040S Wedge compression fracture of fourth lumbar vertebra, sequela: Secondary | ICD-10-CM | POA: Diagnosis not present

## 2016-11-28 ENCOUNTER — Encounter (HOSPITAL_COMMUNITY): Payer: Self-pay

## 2016-11-28 ENCOUNTER — Encounter (HOSPITAL_COMMUNITY)
Admission: RE | Admit: 2016-11-28 | Discharge: 2016-11-28 | Disposition: A | Discharge status: Home / Self Care | Payer: PPO | Source: Ambulatory Visit | Attending: Orthopedic Surgery | Admitting: Orthopedic Surgery

## 2016-11-28 DIAGNOSIS — Z01812 Encounter for preprocedural laboratory examination: Secondary | ICD-10-CM | POA: Diagnosis not present

## 2016-11-28 HISTORY — DX: Frequency of micturition: R35.0

## 2016-11-28 HISTORY — DX: Peripheral vascular disease, unspecified: I73.9

## 2016-11-28 HISTORY — DX: Dorsalgia, unspecified: M54.9

## 2016-11-28 HISTORY — DX: Other chronic pain: G89.29

## 2016-11-28 LAB — CBC
HCT: 43.4 % (ref 39.0–52.0)
Hemoglobin: 14 g/dL (ref 13.0–17.0)
MCH: 29.8 pg (ref 26.0–34.0)
MCHC: 32.3 g/dL (ref 30.0–36.0)
MCV: 92.3 fL (ref 78.0–100.0)
Platelets: 169 10*3/uL (ref 150–400)
RBC: 4.7 MIL/uL (ref 4.22–5.81)
RDW: 12.8 % (ref 11.5–15.5)
WBC: 8.7 10*3/uL (ref 4.0–10.5)

## 2016-11-28 LAB — BASIC METABOLIC PANEL
Anion gap: 10 (ref 5–15)
BUN: 12 mg/dL (ref 6–20)
CO2: 25 mmol/L (ref 22–32)
Calcium: 9.3 mg/dL (ref 8.9–10.3)
Chloride: 105 mmol/L (ref 101–111)
Creatinine, Ser: 0.94 mg/dL (ref 0.61–1.24)
GFR calc Af Amer: >60 mL/min (ref >60)
GFR calc non Af Amer: >60 mL/min (ref >60)
Glucose, Bld: 118 mg/dL — ABNORMAL HIGH (ref 65–99)
Potassium: 3.4 mmol/L — ABNORMAL LOW (ref 3.5–5.1)
Sodium: 140 mmol/L (ref 135–145)

## 2016-11-28 LAB — SURGICAL PCR SCREEN
MRSA, PCR: NEGATIVE
Staphylococcus aureus: POSITIVE — AB

## 2016-11-28 NOTE — Progress Notes (Addendum)
Cardiologist is Dr.Nishan-clearance note in epic from 11-20-16  Medical Md is Dr.Brent Burnette  Echo-multiple  Stress test in epic from 2012/2015  Heart cath in epic from 2011/2012  EKG in epic from 03-16-16  CXR in epic from 07-04-16

## 2016-11-28 NOTE — Progress Notes (Signed)
Mupirocin script called into the [facility] in [place] [phone].Pt notified and verbalized understanding on how to use

## 2016-11-28 NOTE — Pre-Procedure Instructions (Signed)
[name]  11/28/2016      [facility] - [place], [place] [address] Phone: [phone] Fax: [phone]  [id number] - [place], [place] - [address] [place] [address] Phone: [phone] Fax: [phone]    Your procedure is scheduled on Thurs, Feb 1 @ 12:20 PM  Report to [facility] Admitting at 10:20 AM  Call this number if you have problems the morning of surgery:  [phone]   Remember:  Do not eat food or drink liquids after midnight.  Take these medicines the morning of surgery with A SIP OF WATER Metoprolol(Lopressor) and Flomax(Tamsulosin)              Stop taking your Aspirin along with any Vitamins or Herbal Medications. No Goody's,BC's,Aleve,Advil,Motrin,Ibuprofen,or Fish Oil.    Do not wear jewelry.  Do not wear lotions, powders,colognes, or deoderant.             Men may shave face and neck.  Do not bring valuables to the hospital.  [facility] is not responsible for any belongings or valuables.  Contacts, dentures or bridgework may not be worn into surgery.  Leave your suitcase in the car.  After surgery it may be brought to your room.  For patients admitted to the hospital, discharge time will be determined by your treatment team.  Patients discharged the day of surgery will not be allowed to drive home.   [facility]  Before surgery, you can play an important role.  Because skin is not sterile, your skin needs to be as free of germs as possible.  You can reduce the number of germs on you skin by washing with CHG (chlorahexidine gluconate) soap before surgery.  CHG is an antiseptic cleaner which kills germs and bonds with the skin to continue killing germs even after washing.  Please DO NOT use if you have an allergy to CHG or antibacterial soaps.  If  your skin becomes reddened/irritated stop using the CHG and inform your nurse when you arrive at Short Stay.  Do not shave (including legs and underarms) for at least 48 hours prior to the first CHG shower.  You may shave your face.  Please follow these instructions carefully:   1.  Shower with CHG Soap the night before surgery and the                                morning of Surgery.  2.  If you choose to wash your hair, wash your hair first as usual with your       normal shampoo.  3.  After you shampoo, rinse your hair and body thoroughly to remove the                      Shampoo.  4.  Use CHG as you would any other liquid soap.  You can apply chg directly       to the skin and wash gently with scrungie or a clean washcloth.  5.  Apply the CHG Soap to your body ONLY FROM THE NECK DOWN.        Do not use on open wounds or open sores.  Avoid contact with your eyes,  ears, mouth and genitals (private parts).  Wash genitals (private parts)       with your normal soap.  6.  Wash thoroughly, paying special attention to the area where your surgery        will be performed.  7.  Thoroughly rinse your body with warm water from the neck down.  8.  DO NOT shower/wash with your normal soap after using and rinsing off       the CHG Soap.  9.  Pat yourself dry with a clean towel.            10.  Wear clean pajamas.            11.  Place clean sheets on your bed the night of your first shower and do not        sleep with pets.  Day of Surgery  Do not apply any lotions/deoderants the morning of surgery.  Please wear clean clothes to the hospital/surgery center.    Please read over the following fact sheets that you were given. Pain Booklet, Coughing and Deep Breathing, MRSA Information and Surgical Site Infection Prevention

## 2016-12-03 ENCOUNTER — Ambulatory Visit (INDEPENDENT_AMBULATORY_CARE_PROVIDER_SITE_OTHER): Payer: PPO | Admitting: *Deleted

## 2016-12-03 DIAGNOSIS — I63212 Cerebral infarction due to unspecified occlusion or stenosis of left vertebral arteries: Secondary | ICD-10-CM | POA: Diagnosis not present

## 2016-12-03 NOTE — Progress Notes (Signed)
[facility] Summary Report / Loop Recorder 

## 2016-12-06 ENCOUNTER — Encounter (HOSPITAL_COMMUNITY): Payer: Self-pay | Admitting: *Deleted

## 2016-12-06 ENCOUNTER — Encounter (HOSPITAL_COMMUNITY)
Admission: RE | Disposition: A | Discharge status: Home / Self Care | Payer: Self-pay | Source: Ambulatory Visit | Attending: Orthopedic Surgery

## 2016-12-06 ENCOUNTER — Ambulatory Visit (HOSPITAL_COMMUNITY): Payer: PPO | Admitting: Anesthesiology

## 2016-12-06 ENCOUNTER — Observation Stay (HOSPITAL_COMMUNITY)
Admission: RE | Admit: 2016-12-06 | Discharge: 2016-12-07 | Disposition: A | Discharge status: Home / Self Care | Payer: PPO | Source: Ambulatory Visit | Attending: Orthopedic Surgery | Admitting: Orthopedic Surgery

## 2016-12-06 ENCOUNTER — Observation Stay (HOSPITAL_COMMUNITY): Payer: PPO

## 2016-12-06 DIAGNOSIS — G4730 Sleep apnea, unspecified: Secondary | ICD-10-CM | POA: Insufficient documentation

## 2016-12-06 DIAGNOSIS — K219 Gastro-esophageal reflux disease without esophagitis: Secondary | ICD-10-CM | POA: Diagnosis not present

## 2016-12-06 DIAGNOSIS — I252 Old myocardial infarction: Secondary | ICD-10-CM | POA: Insufficient documentation

## 2016-12-06 DIAGNOSIS — Z7982 Long term (current) use of aspirin: Secondary | ICD-10-CM | POA: Insufficient documentation

## 2016-12-06 DIAGNOSIS — E782 Mixed hyperlipidemia: Secondary | ICD-10-CM | POA: Diagnosis not present

## 2016-12-06 DIAGNOSIS — M1990 Unspecified osteoarthritis, unspecified site: Secondary | ICD-10-CM | POA: Diagnosis not present

## 2016-12-06 DIAGNOSIS — M549 Dorsalgia, unspecified: Secondary | ICD-10-CM | POA: Diagnosis present

## 2016-12-06 DIAGNOSIS — I10 Essential (primary) hypertension: Secondary | ICD-10-CM | POA: Diagnosis not present

## 2016-12-06 DIAGNOSIS — M5441 Lumbago with sciatica, right side: Secondary | ICD-10-CM | POA: Diagnosis not present

## 2016-12-06 DIAGNOSIS — I255 Ischemic cardiomyopathy: Secondary | ICD-10-CM | POA: Diagnosis not present

## 2016-12-06 DIAGNOSIS — S32040A Wedge compression fracture of fourth lumbar vertebra, initial encounter for closed fracture: Secondary | ICD-10-CM | POA: Diagnosis not present

## 2016-12-06 DIAGNOSIS — Z951 Presence of aortocoronary bypass graft: Secondary | ICD-10-CM | POA: Insufficient documentation

## 2016-12-06 DIAGNOSIS — S62012D Displaced fracture of distal pole of navicular [scaphoid] bone of left wrist, subsequent encounter for fracture with routine healing: Secondary | ICD-10-CM | POA: Insufficient documentation

## 2016-12-06 DIAGNOSIS — F17200 Nicotine dependence, unspecified, uncomplicated: Secondary | ICD-10-CM | POA: Insufficient documentation

## 2016-12-06 DIAGNOSIS — M5126 Other intervertebral disc displacement, lumbar region: Secondary | ICD-10-CM | POA: Diagnosis not present

## 2016-12-06 DIAGNOSIS — I2510 Atherosclerotic heart disease of native coronary artery without angina pectoris: Secondary | ICD-10-CM | POA: Insufficient documentation

## 2016-12-06 DIAGNOSIS — M48062 Spinal stenosis, lumbar region with neurogenic claudication: Secondary | ICD-10-CM | POA: Diagnosis not present

## 2016-12-06 DIAGNOSIS — I739 Peripheral vascular disease, unspecified: Secondary | ICD-10-CM | POA: Diagnosis not present

## 2016-12-06 DIAGNOSIS — Z885 Allergy status to narcotic agent status: Secondary | ICD-10-CM | POA: Diagnosis not present

## 2016-12-06 DIAGNOSIS — X58XXXA Exposure to other specified factors, initial encounter: Secondary | ICD-10-CM | POA: Diagnosis not present

## 2016-12-06 DIAGNOSIS — M4807 Spinal stenosis, lumbosacral region: Secondary | ICD-10-CM | POA: Diagnosis not present

## 2016-12-06 DIAGNOSIS — Z419 Encounter for procedure for purposes other than remedying health state, unspecified: Secondary | ICD-10-CM

## 2016-12-06 DIAGNOSIS — M4856XA Collapsed vertebra, not elsewhere classified, lumbar region, initial encounter for fracture: Secondary | ICD-10-CM | POA: Diagnosis not present

## 2016-12-06 DIAGNOSIS — Z8673 Personal history of transient ischemic attack (TIA), and cerebral infarction without residual deficits: Secondary | ICD-10-CM | POA: Diagnosis not present

## 2016-12-06 HISTORY — PX: LUMBAR LAMINECTOMY/DECOMPRESSION MICRODISCECTOMY: SHX5026

## 2016-12-06 HISTORY — PX: KYPHOPLASTY: SHX5884

## 2016-12-06 SURGERY — KYPHOPLASTY
Anesthesia: General | Site: Spine Lumbar | Laterality: Right

## 2016-12-06 MED ORDER — CEFAZOLIN SODIUM-DEXTROSE 2-4 GM/100ML-% IV SOLN
2.0000 g | INTRAVENOUS | Status: AC
  Administered 2016-12-06: 2 g via INTRAVENOUS

## 2016-12-06 MED ORDER — OXYCODONE HCL 5 MG PO TABS
ORAL_TABLET | ORAL | Status: AC
  Administered 2016-12-06: 10 mg via ORAL
  Filled 2016-12-06: qty 2

## 2016-12-06 MED ORDER — EPHEDRINE SULFATE-NACL 50-0.9 MG/10ML-% IV SOSY
PREFILLED_SYRINGE | INTRAVENOUS | Status: DC | PRN
  Administered 2016-12-06 (×2): 10 mg via INTRAVENOUS

## 2016-12-06 MED ORDER — FENTANYL CITRATE (PF) 100 MCG/2ML IJ SOLN
INTRAMUSCULAR | Status: AC
  Filled 2016-12-06: qty 2

## 2016-12-06 MED ORDER — METHOCARBAMOL 1000 MG/10ML IJ SOLN
500.0000 mg | Freq: Four times a day (QID) | INTRAVENOUS | Status: DC | PRN
  Filled 2016-12-06: qty 5

## 2016-12-06 MED ORDER — ALUM & MAG HYDROXIDE-SIMETH 200-200-20 MG/5ML PO SUSP
30.0000 mL | ORAL | Status: DC | PRN
  Administered 2016-12-06: 30 mL via ORAL
  Filled 2016-12-06: qty 30

## 2016-12-06 MED ORDER — PROPOFOL 10 MG/ML IV BOLUS
INTRAVENOUS | Status: DC | PRN
  Administered 2016-12-06: 100 mg via INTRAVENOUS
  Administered 2016-12-06: 50 mg via INTRAVENOUS

## 2016-12-06 MED ORDER — THROMBIN 20000 UNITS EX SOLR
CUTANEOUS | Status: DC | PRN
  Administered 2016-12-06: 20000 [IU] via TOPICAL

## 2016-12-06 MED ORDER — ONDANSETRON HCL 4 MG/2ML IJ SOLN: 4.0000 mg | INTRAMUSCULAR | Status: DC | PRN

## 2016-12-06 MED ORDER — LACTATED RINGERS IV SOLN: INTRAVENOUS | Status: DC

## 2016-12-06 MED ORDER — ACETAMINOPHEN 650 MG RE SUPP: 650.0000 mg | RECTAL | Status: DC | PRN

## 2016-12-06 MED ORDER — BUPIVACAINE-EPINEPHRINE (PF) 0.5% -1:200000 IJ SOLN
INTRAMUSCULAR | Status: AC
  Filled 2016-12-06: qty 30

## 2016-12-06 MED ORDER — METOCLOPRAMIDE HCL 5 MG/ML IJ SOLN
5.0000 mg | Freq: Once | INTRAMUSCULAR | Status: AC
  Administered 2016-12-06: 5 mg via INTRAVENOUS

## 2016-12-06 MED ORDER — SODIUM CHLORIDE 0.9% FLUSH: 3.0000 mL | INTRAVENOUS | Status: DC | PRN

## 2016-12-06 MED ORDER — ROCURONIUM BROMIDE 100 MG/10ML IV SOLN
INTRAVENOUS | Status: DC | PRN
  Administered 2016-12-06: 50 mg via INTRAVENOUS

## 2016-12-06 MED ORDER — POLYETHYLENE GLYCOL 3350 17 G PO PACK: 17.0000 g | PACK | Freq: Every day | ORAL | Status: DC | PRN

## 2016-12-06 MED ORDER — OXYCODONE HCL 5 MG PO TABS
10.0000 mg | ORAL_TABLET | ORAL | Status: DC | PRN
  Administered 2016-12-06 – 2016-12-07 (×5): 10 mg via ORAL
  Filled 2016-12-06 (×4): qty 2

## 2016-12-06 MED ORDER — LACTATED RINGERS IV SOLN
INTRAVENOUS | Status: DC
  Administered 2016-12-06: 11:00:00 via INTRAVENOUS

## 2016-12-06 MED ORDER — FENTANYL CITRATE (PF) 100 MCG/2ML IJ SOLN
25.0000 ug | INTRAMUSCULAR | Status: DC | PRN
  Administered 2016-12-06 (×3): 50 ug via INTRAVENOUS

## 2016-12-06 MED ORDER — LIDOCAINE HCL (CARDIAC) 20 MG/ML IV SOLN
INTRAVENOUS | Status: DC | PRN
  Administered 2016-12-06: 60 mg via INTRAVENOUS

## 2016-12-06 MED ORDER — TAMSULOSIN HCL 0.4 MG PO CAPS
0.4000 mg | ORAL_CAPSULE | Freq: Every day | ORAL | Status: DC
  Administered 2016-12-07: 0.4 mg via ORAL
  Filled 2016-12-06: qty 1

## 2016-12-06 MED ORDER — METOCLOPRAMIDE HCL 5 MG/ML IJ SOLN
INTRAMUSCULAR | Status: AC
  Filled 2016-12-06: qty 2

## 2016-12-06 MED ORDER — PHENYLEPHRINE 40 MCG/ML (10ML) SYRINGE FOR IV PUSH (FOR BLOOD PRESSURE SUPPORT)
PREFILLED_SYRINGE | INTRAVENOUS | Status: DC | PRN
Start: 2016-12-06 — End: 2016-12-06
  Administered 2016-12-06 (×3): 80 ug via INTRAVENOUS

## 2016-12-06 MED ORDER — ACETAMINOPHEN 500 MG PO TABS
1000.0000 mg | ORAL_TABLET | Freq: Four times a day (QID) | ORAL | Status: DC
  Administered 2016-12-06 – 2016-12-07 (×3): 1000 mg via ORAL
  Filled 2016-12-06 (×3): qty 2

## 2016-12-06 MED ORDER — FENTANYL CITRATE (PF) 100 MCG/2ML IJ SOLN
INTRAMUSCULAR | Status: AC
  Administered 2016-12-06: 50 ug via INTRAVENOUS
  Filled 2016-12-06: qty 2

## 2016-12-06 MED ORDER — ACETAMINOPHEN 325 MG PO TABS: 650.0000 mg | ORAL_TABLET | ORAL | Status: DC | PRN

## 2016-12-06 MED ORDER — METHOCARBAMOL 500 MG PO TABS: 500.0000 mg | ORAL_TABLET | Freq: Three times a day (TID) | ORAL | 0 refills | Status: DC | PRN

## 2016-12-06 MED ORDER — ACETAMINOPHEN 10 MG/ML IV SOLN: 1000.0000 mg | Freq: Four times a day (QID) | INTRAVENOUS | Status: DC

## 2016-12-06 MED ORDER — SODIUM CHLORIDE 0.9% FLUSH: 3.0000 mL | Freq: Two times a day (BID) | INTRAVENOUS | Status: DC

## 2016-12-06 MED ORDER — CEFAZOLIN SODIUM-DEXTROSE 2-4 GM/100ML-% IV SOLN
INTRAVENOUS | Status: AC
  Filled 2016-12-06: qty 100

## 2016-12-06 MED ORDER — PHENOL 1.4 % MT LIQD: 1.0000 | OROMUCOSAL | Status: DC | PRN

## 2016-12-06 MED ORDER — OXYCODONE-ACETAMINOPHEN 10-325 MG PO TABS: 1.0000 | ORAL_TABLET | ORAL | 0 refills | Status: DC | PRN

## 2016-12-06 MED ORDER — FENTANYL CITRATE (PF) 100 MCG/2ML IJ SOLN
INTRAMUSCULAR | Status: DC | PRN
  Administered 2016-12-06 (×2): 50 ug via INTRAVENOUS
  Administered 2016-12-06 (×2): 100 ug via INTRAVENOUS

## 2016-12-06 MED ORDER — MIDAZOLAM HCL 2 MG/2ML IJ SOLN
INTRAMUSCULAR | Status: AC
  Filled 2016-12-06: qty 2

## 2016-12-06 MED ORDER — MORPHINE SULFATE (PF) 4 MG/ML IV SOLN
2.0000 mg | INTRAVENOUS | Status: DC | PRN
  Administered 2016-12-06: 2 mg via INTRAVENOUS

## 2016-12-06 MED ORDER — PHENYLEPHRINE HCL 10 MG/ML IJ SOLN
INTRAVENOUS | Status: DC | PRN
  Administered 2016-12-06: 50 ug/min via INTRAVENOUS

## 2016-12-06 MED ORDER — CEFAZOLIN SODIUM-DEXTROSE 2-4 GM/100ML-% IV SOLN
2.0000 g | Freq: Three times a day (TID) | INTRAVENOUS | Status: AC
  Administered 2016-12-06 – 2016-12-07 (×2): 2 g via INTRAVENOUS
  Filled 2016-12-06: qty 100

## 2016-12-06 MED ORDER — ONDANSETRON HCL 4 MG PO TABS: 4.0000 mg | ORAL_TABLET | Freq: Three times a day (TID) | ORAL | 0 refills | Status: DC | PRN

## 2016-12-06 MED ORDER — LACTATED RINGERS IV SOLN
INTRAVENOUS | Status: DC | PRN
  Administered 2016-12-06 (×2): via INTRAVENOUS

## 2016-12-06 MED ORDER — LOSARTAN POTASSIUM 50 MG PO TABS: 100.0000 mg | ORAL_TABLET | Freq: Every day | ORAL | Status: DC

## 2016-12-06 MED ORDER — CARVEDILOL 3.125 MG PO TABS: 3.1250 mg | ORAL_TABLET | Freq: Every day | ORAL | Status: DC

## 2016-12-06 MED ORDER — HEMOSTATIC AGENTS (NO CHARGE) OPTIME
TOPICAL | Status: DC | PRN
  Administered 2016-12-06: 1 via TOPICAL

## 2016-12-06 MED ORDER — IOPAMIDOL (ISOVUE-300) INJECTION 61%
INTRAVENOUS | Status: AC
  Filled 2016-12-06: qty 50

## 2016-12-06 MED ORDER — MIDAZOLAM HCL 5 MG/5ML IJ SOLN
INTRAMUSCULAR | Status: DC | PRN
  Administered 2016-12-06: 2 mg via INTRAVENOUS

## 2016-12-06 MED ORDER — METOPROLOL TARTRATE 25 MG PO TABS: 25.0000 mg | ORAL_TABLET | Freq: Every day | ORAL | Status: DC

## 2016-12-06 MED ORDER — METHOCARBAMOL 500 MG PO TABS
ORAL_TABLET | ORAL | Status: AC
  Administered 2016-12-06: 500 mg via ORAL
  Filled 2016-12-06: qty 1

## 2016-12-06 MED ORDER — ATORVASTATIN CALCIUM 20 MG PO TABS
40.0000 mg | ORAL_TABLET | Freq: Every day | ORAL | Status: DC
  Administered 2016-12-06: 40 mg via ORAL
  Filled 2016-12-06: qty 2

## 2016-12-06 MED ORDER — DEXAMETHASONE SODIUM PHOSPHATE 4 MG/ML IJ SOLN
4.0000 mg | Freq: Four times a day (QID) | INTRAMUSCULAR | Status: AC
  Administered 2016-12-06: 4 mg via INTRAVENOUS
  Filled 2016-12-06: qty 1

## 2016-12-06 MED ORDER — BUPIVACAINE HCL (PF) 0.5 % IJ SOLN
INTRAMUSCULAR | Status: DC | PRN
  Administered 2016-12-06: 10 mL

## 2016-12-06 MED ORDER — SUGAMMADEX SODIUM 200 MG/2ML IV SOLN
INTRAVENOUS | Status: DC | PRN
  Administered 2016-12-06: 169.6 mg via INTRAVENOUS

## 2016-12-06 MED ORDER — IOPAMIDOL (ISOVUE-300) INJECTION 61%
INTRAVENOUS | Status: DC | PRN
  Administered 2016-12-06: 50 mL via INTRAVENOUS

## 2016-12-06 MED ORDER — ONDANSETRON HCL 4 MG/2ML IJ SOLN
INTRAMUSCULAR | Status: DC | PRN
Start: 2016-12-06 — End: 2016-12-06
  Administered 2016-12-06: 4 mg via INTRAVENOUS

## 2016-12-06 MED ORDER — MORPHINE SULFATE (PF) 4 MG/ML IV SOLN
INTRAVENOUS | Status: AC
  Administered 2016-12-06: 2 mg via INTRAVENOUS
  Filled 2016-12-06: qty 1

## 2016-12-06 MED ORDER — METHOCARBAMOL 500 MG PO TABS
500.0000 mg | ORAL_TABLET | Freq: Four times a day (QID) | ORAL | Status: DC | PRN
Start: 2016-12-06 — End: 2016-12-07
  Administered 2016-12-06 – 2016-12-07 (×3): 500 mg via ORAL
  Filled 2016-12-06 (×2): qty 1

## 2016-12-06 MED ORDER — PROPOFOL 10 MG/ML IV BOLUS
INTRAVENOUS | Status: AC
  Filled 2016-12-06: qty 20

## 2016-12-06 MED ORDER — ACETAMINOPHEN 325 MG PO TABS
650.0000 mg | ORAL_TABLET | ORAL | Status: DC | PRN
Start: 2016-12-07 — End: 2016-12-07

## 2016-12-06 MED ORDER — HYDROCHLOROTHIAZIDE 12.5 MG PO CAPS: 12.5000 mg | ORAL_CAPSULE | Freq: Every day | ORAL | Status: DC

## 2016-12-06 MED ORDER — MENTHOL 3 MG MT LOZG: 1.0000 | LOZENGE | OROMUCOSAL | Status: DC | PRN

## 2016-12-06 MED ORDER — BUPIVACAINE HCL (PF) 0.5 % IJ SOLN
INTRAMUSCULAR | Status: AC
  Filled 2016-12-06: qty 30

## 2016-12-06 MED ORDER — DEXAMETHASONE 4 MG PO TABS
4.0000 mg | ORAL_TABLET | Freq: Four times a day (QID) | ORAL | Status: AC
  Administered 2016-12-07 (×2): 4 mg via ORAL
  Filled 2016-12-06 (×2): qty 1

## 2016-12-06 MED ORDER — THROMBIN 20000 UNITS EX SOLR
CUTANEOUS | Status: AC
  Filled 2016-12-06: qty 20000

## 2016-12-06 SURGICAL SUPPLY — 77 items
BANDAGE ADH SHEER 1  50/CT (GAUZE/BANDAGES/DRESSINGS) IMPLANT
BLADE SURG 15 STRL LF DISP TIS (BLADE) ×2 IMPLANT
BLADE SURG 15 STRL SS (BLADE) ×2
BNDG GAUZE ELAST 4 BULKY (GAUZE/BANDAGES/DRESSINGS) ×3 IMPLANT
BONE FILLER DEVICE STRL SZ3 (INSTRUMENTS) IMPLANT
BUR EGG ELITE 4.0 (BURR) IMPLANT
CEMENT BONE KYPHX HV R (Orthopedic Implant) ×3 IMPLANT
CEMENT KYPHON C01A KIT/MIXER (Cement) ×3 IMPLANT
CLSR STERI-STRIP ANTIMIC 1/2X4 (GAUZE/BANDAGES/DRESSINGS) ×3 IMPLANT
CORDS BIPOLAR (ELECTRODE) ×3 IMPLANT
COVER MAYO STAND STRL (DRAPES) IMPLANT
CURETTE WEDGE 8.5MM KYPHX (MISCELLANEOUS) IMPLANT
DRAPE C-ARM 42X72 X-RAY (DRAPES) ×6 IMPLANT
DRAPE INCISE IOBAN 66X45 STRL (DRAPES) IMPLANT
DRAPE LAPAROTOMY T 102X78X121 (DRAPES) ×3 IMPLANT
DRAPE POUCH INSTRU U-SHP 10X18 (DRAPES) ×3 IMPLANT
DRAPE PROXIMA HALF (DRAPES) ×6 IMPLANT
DRAPE SURG 17X11 SM STRL (DRAPES) ×3 IMPLANT
DRAPE U-SHAPE 47X51 STRL (DRAPES) ×3 IMPLANT
DRSG AQUACEL AG ADV 3.5X 6 (GAUZE/BANDAGES/DRESSINGS) IMPLANT
DRSG AQUACEL AG ADV 3.5X10 (GAUZE/BANDAGES/DRESSINGS) ×3 IMPLANT
DURAPREP 26ML APPLICATOR (WOUND CARE) ×3 IMPLANT
ELECT BLADE 4.0 EZ CLEAN MEGAD (MISCELLANEOUS) ×3
ELECT CAUTERY BLADE 6.4 (BLADE) ×3 IMPLANT
ELECT PENCIL ROCKER SW 15FT (MISCELLANEOUS) ×3 IMPLANT
ELECT REM PT RETURN 9FT ADLT (ELECTROSURGICAL) ×3
ELECTRODE BLDE 4.0 EZ CLN MEGD (MISCELLANEOUS) ×2 IMPLANT
ELECTRODE REM PT RTRN 9FT ADLT (ELECTROSURGICAL) ×2 IMPLANT
FLOSEAL (HEMOSTASIS) IMPLANT
GAUZE SPONGE 4X4 16PLY XRAY LF (GAUZE/BANDAGES/DRESSINGS) IMPLANT
GLOVE BIO SURGEON STRL SZ 6.5 (GLOVE) ×6 IMPLANT
GLOVE BIOGEL PI IND STRL 6.5 (GLOVE) ×4 IMPLANT
GLOVE BIOGEL PI IND STRL 8.5 (GLOVE) ×4 IMPLANT
GLOVE BIOGEL PI INDICATOR 6.5 (GLOVE) ×2
GLOVE BIOGEL PI INDICATOR 8.5 (GLOVE) ×2
GLOVE SS BIOGEL STRL SZ 8.5 (GLOVE) ×4 IMPLANT
GLOVE SUPERSENSE BIOGEL SZ 8.5 (GLOVE) ×2
GOWN STRL REUS W/ TWL LRG LVL3 (GOWN DISPOSABLE) ×2 IMPLANT
GOWN STRL REUS W/TWL 2XL LVL3 (GOWN DISPOSABLE) ×6 IMPLANT
GOWN STRL REUS W/TWL LRG LVL3 (GOWN DISPOSABLE) ×2
KIT BASIN OR (CUSTOM PROCEDURE TRAY) ×3 IMPLANT
KIT ROOM TURNOVER OR (KITS) ×3 IMPLANT
NEEDLE 22X1 1/2 (OR ONLY) (NEEDLE) ×3 IMPLANT
NEEDLE HYPO 25X1 1.5 SAFETY (NEEDLE) ×3 IMPLANT
NEEDLE SPNL 18GX3.5 QUINCKE PK (NEEDLE) ×6 IMPLANT
NS IRRIG 1000ML POUR BTL (IV SOLUTION) ×3 IMPLANT
PACK LAMINECTOMY ORTHO (CUSTOM PROCEDURE TRAY) ×3 IMPLANT
PACK SURGICAL SETUP 50X90 (CUSTOM PROCEDURE TRAY) IMPLANT
PACK UNIVERSAL I (CUSTOM PROCEDURE TRAY) ×3 IMPLANT
PAD ARMBOARD 7.5X6 YLW CONV (MISCELLANEOUS) ×6 IMPLANT
PATTIES SURGICAL .5 X.5 (GAUZE/BANDAGES/DRESSINGS) IMPLANT
PATTIES SURGICAL .5 X1 (DISPOSABLE) ×3 IMPLANT
SPONGE LAP 4X18 X RAY DECT (DISPOSABLE) ×3 IMPLANT
SPONGE SURGIFOAM ABS GEL 100 (HEMOSTASIS) ×3 IMPLANT
STAPLER VISISTAT 35W (STAPLE) IMPLANT
STRIP CLOSURE SKIN 1/2X4 (GAUZE/BANDAGES/DRESSINGS) IMPLANT
SURGIFLO W/THROMBIN 8M KIT (HEMOSTASIS) ×3 IMPLANT
SUT BONE WAX W31G (SUTURE) ×3 IMPLANT
SUT MON AB 3-0 SH 27 (SUTURE) ×4
SUT MON AB 3-0 SH27 (SUTURE) ×4 IMPLANT
SUT VIC AB 1 CT1 18XCR BRD 8 (SUTURE) ×2 IMPLANT
SUT VIC AB 1 CT1 27 (SUTURE)
SUT VIC AB 1 CT1 27XBRD ANTBC (SUTURE) IMPLANT
SUT VIC AB 1 CT1 8-18 (SUTURE) ×3
SUT VIC AB 2-0 CT1 18 (SUTURE) ×3 IMPLANT
SUT VIC AB 2-0 FS1 27 (SUTURE) ×3 IMPLANT
SUT VICRYL 0 UR6 27IN ABS (SUTURE) ×3 IMPLANT
SYR BULB IRRIGATION 50ML (SYRINGE) ×3 IMPLANT
SYR CONTROL 10ML LL (SYRINGE) ×3 IMPLANT
TOWEL OR 17X24 6PK STRL BLUE (TOWEL DISPOSABLE) ×3 IMPLANT
TOWEL OR 17X26 10 PK STRL BLUE (TOWEL DISPOSABLE) ×6 IMPLANT
TRAY FOLEY CATH 16FRSI W/METER (SET/KITS/TRAYS/PACK) ×3 IMPLANT
TRAY KYPHOPAK 15/3 ONESTEP 1ST (MISCELLANEOUS) ×3 IMPLANT
TRAY KYPHOPAK 20/3 ONESTEP 1ST (MISCELLANEOUS) ×3 IMPLANT
TUBE CONNECTING 12X1/4 (SUCTIONS) ×3 IMPLANT
WATER STERILE IRR 1000ML POUR (IV SOLUTION) ×3 IMPLANT
YANKAUER SUCT BULB TIP NO VENT (SUCTIONS) ×3 IMPLANT

## 2016-12-06 NOTE — Anesthesia Procedure Notes (Signed)
Procedure Name: Intubation Date/Time: 12/06/2016 12:36 PM Performed by: Manus Gunning, Chalise Pe J Pre-anesthesia Checklist: Patient identified, Emergency Drugs available, Suction available, Patient being monitored and Timeout performed Patient Re-evaluated:Patient Re-evaluated prior to inductionOxygen Delivery Method: Circle system utilized Preoxygenation: Pre-oxygenation with 100% oxygen Intubation Type: IV induction Ventilation: Mask ventilation without difficulty Laryngoscope Size: Mac and 4 Grade View: Grade II Tube type: Oral Tube size: 7.5 mm Number of attempts: 1 Placement Confirmation: ETT inserted through vocal cords under direct vision,  positive ETCO2 and breath sounds checked- equal and bilateral Secured at: 21 cm Tube secured with: Tape Dental Injury: Teeth and Oropharynx as per pre-operative assessment

## 2016-12-06 NOTE — Brief Op Note (Signed)
12/06/2016  3:37 PM  PATIENT:  [name]  61 y.o. male  PRE-OPERATIVE DIAGNOSIS:  Chronic L4 compression fracture, L4-S1 right lateral and foraminal stenosis  POST-OPERATIVE DIAGNOSIS:  Chronic Lumbar four compression fracture, Lumbar four-Sacral one lateral and foraminal stenosis   PROCEDURE:  Procedure(s) with comments: KYPHOPLASTY L4  (N/A) - Requests for 3 hours L4-S1 decompression right side  (Right)  SURGEON:  Surgeon(s) and Role:    * Melina Schools, MD - Primary  PHYSICIAN ASSISTANT:   ASSISTANTS: Carmen Mayo   ANESTHESIA:   general  EBL:  Total I/O In: 1000 [I.V.:1000] Out: 375 [Urine:225; Blood:150]  BLOOD ADMINISTERED:none  DRAINS: none   LOCAL MEDICATIONS USED:  MARCAINE     SPECIMEN:  No Specimen  DISPOSITION OF SPECIMEN:  N/A  COUNTS:  YES  TOURNIQUET:  * No tourniquets in log *  DICTATION: .Other Dictation: Dictation Number [id number]  PLAN OF CARE: Admit for overnight observation  PATIENT DISPOSITION:  PACU - hemodynamically stable.

## 2016-12-06 NOTE — Transfer of Care (Signed)
Immediate Anesthesia Transfer of Care Note  Patient: [name]  Procedure(s) Performed: Procedure(s) with comments: KYPHOPLASTY L4  (N/A) - Requests for 3 hours L4-S1 decompression right side  (Right)  Patient Location: PACU  Anesthesia Type:General  Level of Consciousness: awake  Airway & Oxygen Therapy: Patient Spontanous Breathing  Post-op Assessment: Report given to RN and Post -op Vital signs reviewed and stable  Post vital signs: Reviewed and stable  Last Vitals:  Vitals:   12/06/16 1036 12/06/16 1550  BP: (!) 142/92 (!) (P) 161/86  Pulse: 65 (P) 72  Resp: 18 (P) 16  Temp: 36.9 C (P) 36.5 C    Last Pain:  Vitals:   12/06/16 1550  TempSrc:   PainSc: (P) Asleep         Complications: No apparent anesthesia complications

## 2016-12-06 NOTE — H&P (Signed)
History of Present Illness The patient is a 61 year old male who comes in today for a preoperative History and Physical. The patient is scheduled for a L4 Kyphoplasty L4-S1 Decompression, right side to be performed by Dr. Duane Lope D. Rolena Infante, MD at [facility] on 12/06/2016 . Please see the hospital record for complete dictated history and physical. The pt has a hx of 2 MIs and open heart surgery. It has been 2 years since the surgery. The pt reports he has stopped smoking 3 months ago. The pt has sleep apnea. This is anew diagnosis since the neck surgery.   Problem List/Past Medical Carpal tunnel syndrome of left wrist (G56.02)  Closed displaced fracture of middle third of scaphoid of right wrist with routine healing (S62.021D)  Chronic bilateral low back pain with right-sided sciatica (M54.41)  Altered gait (R26.9)  Disorder of intervertebral disc at C4-C5 level with myelopathy (M50.021)  Compression fracture of fourth lumbar vertebra, sequela (S32.040S)  Spinal stenosis of lumbar region with neurogenic claudication [id number]  Facet arthropathy, lumbar (M46.96)  Lumbar DDD (M51.36)  Encounter for care following Cervical Fusion (Z47.89)  Numbness of left hand (R20.0)  Cerebrovascular accident (CVA) due to other mechanism (I63.8)  Problems Reconciled   Allergies No Known Drug Allergies [09/10/2015]: Allergies Reconciled   Family History Congestive Heart Failure  Father, Paternal Grandfather, Paternal Grandmother. First Degree Relatives  reported Heart Disease  Father, Paternal Grandfather, Paternal Grandmother. Heart disease in male family member before age 68   Social History Tobacco use  Current every day smoker. 09/10/2015 Tobacco / smoke exposure  09/10/2015: yes outdoors only Children  3 Current drinker  09/10/2015: Currently drinks wine only occasionally per week Current work status  working full time Exercise  Exercises rarely; does  other Living situation  live with spouse Marital status  married No history of drug/alcohol rehab  Not under pain contract  Number of flights of stairs before winded  less than 1  Medication History  TraMADol HCl (50MG  Tablet, 1 (one) Tablet Oral PO TID, Taken starting 11/13/2016) Active. (DDB/RCY) Clopidogrel Bisulfate (75MG  Tablet, Oral) Active. (qd) Triamcinolone Acetonide (0.5% Ointment, External) Active. (prn) Multi Vitamin Mens (Oral) Active. (qd) Atorvastatin Calcium (40MG  Tablet, Oral) Active. (qd) Carvedilol (3.125MG  Tablet, Oral) Active. (qd) Losartan Potassium (100MG  Tablet, Oral) Active. (qd) Aspirin (81MG  Tablet, Oral) Active. (qd)  Vitals  11/27/2016 1:32 PM Weight: 186 lb Height: 67.5in Body Surface Area: 1.97 m Body Mass Index: 28.7 kg/m  Temp.: 98.24F  BP: 116/91 (Sitting, Left Arm, Standard) w/shoes smt  General General Appearance-Not in acute distress. Orientation-Oriented X3. Build & Nutrition-Well nourished and Well developed.  Integumentary General Characteristics Surgical Scars - no surgical scar evidence of previous lumbar surgery. Lumbar Spine-Skin examination of the lumbar spine is without deformity, skin lesions, lacerations or abrasions.  Chest and Lung Exam Auscultation Breath sounds - Normal and Clear.  Cardiovascular Auscultation Rhythm - Regular rate and rhythm.  Abdomen Palpation/Percussion Palpation and Percussion of the abdomen reveal - Soft, Non Tender and No Rebound tenderness.  Peripheral Vascular Lower Extremity Palpation - Posterior tibial pulse - Bilateral - 2+. Dorsalis pedis pulse - Bilateral - 2+.  Neurologic Sensation Lower Extremity - Left - sensation is intact in the lower extremity. Right - sensation is diminished in the lower extremity. Reflexes Patellar Reflex - Bilateral - 2+. Achilles Reflex - Bilateral - 2+. Clonus - Bilateral - clonus not present. Hoffman's Sign - Bilateral -  Hoffman's sign not present. Testing Seated Straight Leg Raise -  Bilateral - Seated straight leg raise negative.  Musculoskeletal Spine/Ribs/Pelvis  Lumbosacral Spine: Inspection and Palpation - Tenderness - left lumbar paraspinals tender to palpation and right lumbar paraspinals tender to palpation. Strength and Tone: Strength - Hip Flexion - Bilateral - 5/5. Knee Extension - Bilateral - 5/5. Knee Flexion - Bilateral - 5/5. Ankle Dorsiflexion - Left - 5/5. Right - 4/5. Ankle Plantarflexion - Bilateral - 5/5. Heel walk - Bilateral - able to heel walk with moderate difficulty. Toe Walk - Bilateral - able to walk on toes with moderate difficulty. ROM - Flexion - moderately decreased range of motion and painful. Extension - moderately decreased range of motion and painful. Left Lateral Bending - moderately decreased range of motion and painful. Right Lateral Bending - moderately decreased range of motion and painful. Right Rotation - moderately decreased range of motion and painful. Left Rotation - moderately decreased range of motion and painful. Pain - neither flexion or extension is more painful than the other. Lumbosacral Spine - Waddell's Signs - no Waddell's signs present. Lower Extremity Range of Motion - No true hip, knee or ankle pain with range of motion. Gait and Station - [employer] - no assistive devices.  The patient's MRI demonstrates a moderate compression fracture of L4, slight chronic fractures of L1 and L3. He has significant right lateral recess stenosis at L5-S1 and L4-L5. There is asymmetrically mass effect on the right L5 nerve root in the foramen of L4 dorsal root ganglion. Broad based disk protrusion displacing the right S1 nerve root with moderate-to-severe right foraminal stenosis at this point in time.  Assessment & Plan   Goal Of Surgery: Discussed that goal of surgery is to reduce pain and improve function and quality of life. Patient is aware that despite all  appropriate treatment that there pain and function could be the same, worse, or different.  Posterior Lumbar Decompression/disectomy: Risks of surgery include infection, bleeding, nerve damage, death, stroke, paralysis, failure to heal, need for further surgery, ongoing or worse pain, need for further surgery, CSF leak, loss of bowel or bladder, and recurrent disc herniation or Stenosis which would necessitate need for further surgery. Spinal stenosis of lumbar region with neurogenic claudication [id number])  His MRI demonstrates the moderate compression fracture of L4, slight chronic fractures of L1 and L3. He has significant right lateral recess stenosis at L5-S1 and L4-5. There is asymmetrical mass effect on the right L5 nerve root in the foraminal L4 dorsal root ganglion and broad based disc protrusion displacing the right S1 nerve root with moderate to severe right foraminal stenosis. At this point in time, I think his principal problem is the 4-5, 5-1 foraminal and lateral recess stenosis. In addition, his back pain could also be coming from the chronic L4 compression fracture. At this point, we had a long discussion and I think he would benefit from a lumbar decompression, right-sided hemilaminectomy of L5 and laminotomy of L4 to do an adequate foraminal decompression and lateral recess decompression on that right side. In addition, we could also consider doing a kyphoplasty of L4. Although he has fractures at L1 and L3 they are very mild compared to the L4 one. Even though it seems to be chronic it can be contributing to his back pain and so we will also move forward with a kyphoplasty at that level. The goal of surgery is to reduce his pain and improve his overall quality of life. The risks were explained to the patient which include infection, bleeding,  nerve damage, death, stroke, paralysis, failure to heal, need for further surgery, ongoing or worst pain, loss of bowel and bladder control, leak of  cement, leak of spinal fluid, need for fusion surgery. All of his questions were addressed.

## 2016-12-06 NOTE — Anesthesia Preprocedure Evaluation (Addendum)
Anesthesia Evaluation  Patient identified by MRN, date of birth, ID band Patient awake    Reviewed: Allergy & Precautions, H&P , NPO status , Patient's Chart, lab work & pertinent test results, reviewed documented beta blocker date and time   Airway Mallampati: II  TM Distance: >3 FB Neck ROM: Full    Dental no notable dental hx. (+) Teeth Intact, Dental Advisory Given   Pulmonary sleep apnea and Continuous Positive Airway Pressure Ventilation , former smoker,    Pulmonary exam normal breath sounds clear to auscultation       Cardiovascular hypertension, Pt. on medications and Pt. on home beta blockers + CAD, + Past MI, + Cardiac Stents, + CABG and + Peripheral Vascular Disease   Rhythm:Regular Rate:Normal     Neuro/Psych CVA negative psych ROS   GI/Hepatic Neg liver ROS, GERD  Controlled,  Endo/Other  negative endocrine ROS  Renal/GU negative Renal ROS  negative genitourinary   Musculoskeletal  (+) Arthritis , Osteoarthritis,    Abdominal   Peds  Hematology negative hematology ROS (+)   Anesthesia Other Findings   Reproductive/Obstetrics negative OB ROS                           Anesthesia Physical Anesthesia Plan  ASA: III  Anesthesia Plan: General   Post-op Pain Management:    Induction: Intravenous  Airway Management Planned: Oral ETT  Additional Equipment:   Intra-op Plan:   Post-operative Plan: Extubation in OR  Informed Consent: I have reviewed the patients History and Physical, chart, labs and discussed the procedure including the risks, benefits and alternatives for the proposed anesthesia with the patient or authorized representative who has indicated his/her understanding and acceptance.   Dental advisory given  Plan Discussed with: CRNA  Anesthesia Plan Comments:         Anesthesia Quick Evaluation

## 2016-12-07 ENCOUNTER — Encounter (HOSPITAL_COMMUNITY): Payer: Self-pay | Admitting: Orthopedic Surgery

## 2016-12-07 DIAGNOSIS — M5126 Other intervertebral disc displacement, lumbar region: Secondary | ICD-10-CM | POA: Diagnosis not present

## 2016-12-07 NOTE — Op Note (Signed)
[name], [name]              ACCOUNT NO.:  [id number]  MEDICAL RECORD NO.:  [id number]  LOCATION:                                 FACILITY:  PHYSICIAN:  Lurline Caver D. Rolena Infante, M.D. DATE OF BIRTH:  [birth year]  DATE OF PROCEDURE:  12/06/2016 DATE OF DISCHARGE:  12/07/2016                              OPERATIVE REPORT   PREOPERATIVE DIAGNOSIS:  Lumbar spinal stenosis with disk herniation, right side L4-5, L5-S1, and L4 compression fracture.  POSTOPERATIVE DIAGNOSIS:  Lumbar spinal stenosis with disk herniation, right side L4-5, L5-S1, and L4 compression fracture.  OPERATIVE PROCEDURE: 1. L4 kyphoplasty. 2. Right L4-L5 hemilaminotomy with diskectomy and decompression.  COMPLICATIONS:  None.  CONDITION:  Stable.  FIRST ASSISTANT:  Ronette Deter, [place].  HISTORY:  This is a very pleasant 61 year old gentleman, who has been complaining of significant back, buttock, and neuropathic radicular right leg pain.  Attempts at conservative management have failed to alleviate his symptoms.  So, we elected to proceed with surgery.  All appropriate risks, benefits, and alternatives were discussed with the patient and consent was obtained.  OPERATIVE NOTE:  The patient was brought to the operating room and placed supine on the operating table.  After successful induction of general anesthesia and endotracheal intubation, TEDs, SCDs, and a Foley were inserted.  The patient was turned prone onto the Wilson frame and all bony prominences were well padded.  The back was then prepped and draped in a standard fashion.  Time-out was taken confirming patient, procedure, and all other pertinent important data.  Once this was done, 2 needles were placed into the back and x-ray was taken to confirm our incision site.  I injected 0.5% Marcaine into the proposed incision site and made my midline incision and sharply dissected down to the deep fascia.  I incised the deep fascia and stripped the paraspinal  muscles on the right side to expose the L5 and L4 lamina.  I placed a Penfield 4 underneath the L5 lamina and took a second x-ray to confirm that I was at the appropriate level.  Once this was confirmed, I used my 3 mm Kerrison punch to perform a laminotomy of L5.  I released the ligamentum flavum from the leading edge of the S1 lamina and then removed the ligamentum flavum to expose the posterior aspect of the thecal sac.  I continued my decompression into the lateral recess and identified the S1 nerve root.  The nerve root was protected and I performed a medial facetectomy to decompress the lateral recess.  I then identified the disk herniation that was seen on the preoperative MRI.  An annulotomy was performed in using a nerve hook and micro pituitary rongeurs.  I excised the disk fragment.  I then used an Epstein curette to debride the osteophyte.  At this point with the posterior lateral recess decompressed and the S1 nerve root decompressed, I then repositioned my retractor and exposed the 4 lamina.  Using the same exact technique, I performed an L4 laminotomy and decompression.  I did a medial facetectomy and foraminotomy in order to adequately decompress the nerve.  I again incised the annulus, removed the hard disk osteophyte using  pituitary rongeurs and Epstein curette.  At this point, I could clearly and easily pass my [facility] elevator up past the L4 pedicle down along the L5 nerve root into the foramen and along the lateral recess and into the S1 foramen.  Both the L5 and S1 nerve roots were completely free and no longer under tension.  At this point, I obtained hemostasis using bipolar electrocautery and FloSeal.  I then turned my attention to the L1 fracture.  At this point, both fluoro machines were brought into the field sterilely in the AP and lateral planes, identified the appropriate landmarks for the lateral and AP of L4.  I made a small incision and advanced the  Jamshidi needle percutaneously down to the lateral aspect of the L4 pedicle.  I advanced this through the pedicle on the AP until I was nearing the medial border of the pedicle.  I confirmed that I was just beyond the posterior wall of the vertebral body on the lateral confirming appropriate trajectory. I advanced the Jamshidi needle further into the vertebral body.  I repeated this on the contralateral side.  I then drilled and then placed my inflatable bone tamps and then inflated.  I then deflated and inserted the bone cement per manufacture's standards.  The cement was allowed to cure.  X-rays at this point were satisfactory in both planes. There was no cement leak.  At this point, I irrigated the wound copiously with normal saline, made sure hemostasis and then closed the midline incision with #1 Vicryl sutures, 2-0 Vicryl sutures, and 3-0 Monocryl.  The 2 small incisions for the kyphoplasty were irrigated and closed with interrupted 2-0 Vicryl suture and a 3-0 Monocryl.  Steri- Strips and dry dressings were applied and the patient was ultimately extubated and transferred to the PACU without incident.  At the end of the case, all needle and sponge counts were correct.  There were no adverse intraoperative events.     Vona Whiters D. Rolena Infante, M.D.   ______________________________ Blake Divine. Rolena Infante, M.D.    DDB/MEDQ  D:  12/06/2016  T:  12/07/2016  Job:  [id number]

## 2016-12-07 NOTE — Progress Notes (Signed)
Pt. Refused CPAP at this time, made aware to notify if needing.

## 2016-12-07 NOTE — Progress Notes (Signed)
    Subjective: Procedure(s) (LRB): KYPHOPLASTY L4  (N/A) L4-S1 decompression right side  (Right) 1 Day Post-Op  Patient reports pain as 2 on 0-10 scale.  Reports decreased leg pain reports incisional back pain   Positive void Negative bowel movement Positive flatus Negative chest pain or shortness of breath  Objective: Vital signs in last 24 hours: Temp:  [97.5 F (36.4 C)-98.4 F (36.9 C)] 97.6 F (36.4 C) (02/02 0410) Pulse Rate:  [65-92] 78 (02/02 0410) Resp:  [16-20] 20 (02/02 0410) BP: (116-161)/(73-92) 131/80 (02/02 0410) SpO2:  [94 %-98 %] 97 % (02/02 0410) Weight:  [84.8 kg (187 lb)] 84.8 kg (187 lb) (02/01 1027)  Intake/Output from previous day: 02/01 0701 - 02/02 0700 In: 1500 [I.V.:1500] Out: 1425 [Urine:1275; Blood:150]  Labs: No results for input(s): WBC, RBC, HCT, PLT in the last 72 hours. No results for input(s): NA, K, CL, CO2, BUN, CREATININE, GLUCOSE, CALCIUM in the last 72 hours. No results for input(s): LABPT, INR in the last 72 hours.  Physical Exam: Neurologically intact ABD soft Intact pulses distally Incision: dressing C/D/I Compartment soft  Assessment/Plan: Patient stable  xrays n/a Continue mobilization with physical therapy Continue care  Advance diet Up with therapy  Doing well Ambulating without pain. Slept well last night Counseled patient about need to stop smoking.   Plan on d/c today.  F/u 2 weeks  Melina Schools, MD [place] [phone]

## 2016-12-07 NOTE — Progress Notes (Signed)
Patient alert and oriented, mae's well, voiding adequate amount of urine, swallowing without difficulty, c/o pain and medication given. Patient discharged home with family. Script and discharged instructions given to patient. Patient and family stated understanding of d/c instructions given and has an appointment with Dr. Rolena Infante

## 2016-12-07 NOTE — Evaluation (Signed)
Occupational Therapy Evaluation and Discharge Patient Details Name: [name] MRN: [id number] DOB: Mar 28, 1956 Today's Date: 12/07/2016    History of Present Illness Pt is a 61 y/o male s/p L4-S1 decompression right side.   Clinical Impression   PTA Pt modified independent in ADL (with AE) and mobility (with SPC). Pt currently mod A for LB ADL and supervision for mobility with SPC. Pt very pleasant and agreeable for therapy. Pt able to recall 3/3 precautions with 1 verbal cue. Pt has adequate DME and AE at home from previous surgeries and is very confident and comfortable going home. No questions or concerns. OT education complete. OT to sign off at this time. Thank you for this referral.     Follow Up Recommendations  No OT follow up;Supervision - Intermittent    Equipment Recommendations  None recommended by OT    Recommendations for Other Services       Precautions / Restrictions Precautions Precautions: Back Precaution Comments: Pt able to recall 3/3 precautions with 1 verbal cue Required Braces or Orthoses: Spinal Brace (orders, but not in room - Per Patient - MD said it is ok) Restrictions Weight Bearing Restrictions: No Other Position/Activity Restrictions: No bending, lifting, twisting (also educated on arching)      Mobility Bed Mobility               General bed mobility comments: Pt OOB and ambulating at nurses station  Transfers Overall transfer level: Needs assistance Equipment used: Straight cane Transfers: Sit to/from Stand Sit to Stand: Supervision         General transfer comment: supervision only to ensure precautions are followed    Balance Overall balance assessment: No apparent balance deficits (not formally assessed)                                          ADL Overall ADL's : Needs assistance/impaired Eating/Feeding: Modified independent;Sitting   Grooming: Supervision/safety Grooming Details (indicate cue  type and reason): educated on cup method for brushing teeth, and setting things up on right to prevent twisting, also education on precautions as they apply to kitchen Upper Body Bathing: Modified independent;Sitting;With adaptive equipment Upper Body Bathing Details (indicate cue type and reason): Pt uses long handle sponge at baseline, and shower chair Lower Body Bathing: Modified independent;Sit to/from stand;With adaptive equipment Lower Body Bathing Details (indicate cue type and reason): Pt uses long handle sponge at baseline to clean lower body Upper Body Dressing : Set up;Sitting   Lower Body Dressing: Moderate assistance;With caregiver independent assisting;With adaptive equipment;Sit to/from stand Lower Body Dressing Details (indicate cue type and reason): Pt fully dressed when OT entered room, Pt stated that he was assisted, but has and is familiar with grabber reacher to maximize independence.  Toilet Transfer: Supervision/safety;Ambulation (educated on using sisters BSC over toilet to assist ) Armed forces technical officer Details (indicate cue type and reason): Pt has standard (low) toilets, so educated in use of BSC to raise up Vincent and Hygiene: Min guard Toileting - Clothing Manipulation Details (indicate cue type and reason): educated in toileting aid for peri care Tub/ Shower Transfer: Walk-in shower;Supervision/safety;With caregiver independent assisting;Ambulation;3 in 1 [facility]   Functional mobility during ADLs: Supervision/safety;Cane       Vision Vision Assessment?: No apparent visual deficits   Perception     Praxis      Pertinent Vitals/Pain Pain Assessment:  0-10 Pain Score: 2  Pain Location: incision site Pain Descriptors / Indicators: Tightness;Sore Pain Intervention(s): Limited activity within patient's tolerance;Monitored during session;Premedicated before session (educated on ice as pain mangement tool)     Hand Dominance Right    Extremity/Trunk Assessment Upper Extremity Assessment Upper Extremity Assessment: Overall WFL for tasks assessed   Lower Extremity Assessment Lower Extremity Assessment: Defer to PT evaluation   Cervical / Trunk Assessment Cervical / Trunk Assessment: Other exceptions Cervical / Trunk Exceptions: post-op back surgery   Communication Communication Communication: No difficulties   Cognition Arousal/Alertness: Awake/alert Behavior During Therapy: WFL for tasks assessed/performed Overall Cognitive Status: Within Functional Limits for tasks assessed                     General Comments       Exercises       Shoulder Instructions      Home Living Family/patient expects to be discharged to:: Private residence Living Arrangements: Spouse/significant other Available Help at Discharge: Family;Available 24 hours/day               Bathroom Shower/Tub: Walk-in shower;Door   [employer] Toilet: Standard Bathroom Accessibility: Yes   Home Equipment: Environmental consultant - 2 wheels;Cane - single point;Shower seat;Bedside commode;Hand held shower head;Adaptive equipment Adaptive Equipment: Reacher;Long-handled sponge        Prior Functioning/Environment Level of Independence: Independent with assistive device(s)        Comments: mobility with SPC; uses AE for ADL        OT Problem List: Decreased knowledge of precautions;Decreased range of motion;Decreased activity tolerance;Decreased strength   OT Treatment/Interventions:      OT Goals(Current goals can be found in the care plan section) Acute Rehab OT Goals Patient Stated Goal: to get back to working out OT Goal Formulation: With patient Time For Goal Achievement: 12/14/16 Potential to Achieve Goals: Good  OT Frequency:     Barriers to D/C:            Co-evaluation              End of Session Equipment Utilized During Treatment:  [facility]) Nurse Communication: Other (comment) (no DME needs, good to  go)  Activity Tolerance: Patient tolerated treatment well Patient left: Other (comment) (going downstairs to dc with NT)   Time: [id number] OT Time Calculation (min): 10 min Charges:  OT General Charges $OT Visit: 1 Procedure OT Evaluation $OT Eval Low Complexity: 1 Procedure G-Codes: OT G-codes **NOT FOR INPATIENT CLASS** Functional Assessment Tool Used: Clinical Judgement Functional Limitation: Self care Self Care Current Status [id number]): At least 40 percent but less than 60 percent impaired, limited or restricted Self Care Goal Status [id number]): At least 1 percent but less than 20 percent impaired, limited or restricted Self Care Discharge Status [phone]): At least 40 percent but less than 60 percent impaired, limited or restricted  Jaci Carrel 12/07/2016, 9:05 AM  [name] OTR/L [phone]

## 2016-12-07 NOTE — Op Note (Deleted)
  The note originally documented on this encounter has been moved the the encounter in which it belongs.  

## 2016-12-07 NOTE — Evaluation (Signed)
Physical Therapy Evaluation Patient Details Name: [name] MRN: [id number] DOB: 03-Feb-1956 Today's Date: 12/07/2016   History of Present Illness  Pt is a 61 y/o male s/p L4-S1 decompression right side, and L4 kyphoplasty on 12/06/16.  Clinical Impression  Patient evaluated by Physical Therapy with no further acute PT needs identified. All education has been completed and the patient has no further questions. At the time of PT eval pt was able to perform transfers and ambulation with gross supervision for safety. Pt with gross antalgic gait pattern with SPC support. Recommending follow-up with outpatient PT for continued skilled therapy when appropriate per post-op protocol. See below for any follow-up Physical Therapy or equipment needs. PT is signing off. Thank you for this referral.     Follow Up Recommendations Outpatient PT    Equipment Recommendations  None recommended by PT    Recommendations for Other Services       Precautions / Restrictions Precautions Precautions: Back Precaution Booklet Issued: Yes (comment) Precaution Comments: Reviewed handout with pt and sister and pt was cued for precautions during functional mobility Required Braces or Orthoses: Spinal Brace (orders, but not in room. Per pt, MD said its ok) Restrictions Weight Bearing Restrictions: No Other Position/Activity Restrictions: No bending, lifting, twisting (also educated on arching)      Mobility  Bed Mobility               General bed mobility comments: Pt received sitting up on EOB  Transfers Overall transfer level: Needs assistance Equipment used: Straight cane Transfers: Sit to/from Stand Sit to Stand: Supervision         General transfer comment: supervision only to ensure precautions are followed.   Ambulation/Gait Ambulation/Gait assistance: Supervision Ambulation Distance (Feet): 400 Feet Assistive device: Straight cane Gait Pattern/deviations: Step-through  pattern;Decreased stride length;Trunk flexed Gait velocity: Decreased Gait velocity interpretation: Below normal speed for age/gender General Gait Details: VC's for improved posture, sequencing and safety with SPC use. Pt with decreased flexion noted at knees and pt reports difficulty prior to surgery due to pain.   Stairs Stairs: Yes Stairs assistance: Min guard Stair Management: One rail Right;Step to pattern;Forwards Number of Stairs: 5 General stair comments: VC's for sequencing and safety.   Wheelchair Mobility    Modified Rankin (Stroke Patients Only)       Balance Overall balance assessment: Needs assistance Sitting-balance support: Feet supported;No upper extremity supported Sitting balance-Leahy Scale: Fair     Standing balance support: Single extremity supported;During functional activity Standing balance-Leahy Scale: Fair                               Pertinent Vitals/Pain Pain Assessment: Faces Pain Score: 2  Faces Pain Scale: Hurts a little bit Pain Location: incision site Pain Descriptors / Indicators: Operative site guarding;Sore Pain Intervention(s): Limited activity within patient's tolerance;Monitored during session;Repositioned    Home Living Family/patient expects to be discharged to:: Private residence Living Arrangements: Spouse/significant other Available Help at Discharge: Family;Available 24 hours/day Type of Home: Mobile home Home Access: Stairs to enter Entrance Stairs-Rails: Right;Left;Can reach both Entrance Stairs-Number of Steps: 3 Home Layout: One level Home Equipment: Walker - 2 wheels;Cane - single point;Shower seat;Bedside commode;Hand held shower head;Adaptive equipment      Prior Function Level of Independence: Independent with assistive device(s)         Comments: mobility with SPC; uses AE for ADL     Hand Dominance  Dominant Hand: Right    Extremity/Trunk Assessment   Upper Extremity  Assessment Upper Extremity Assessment: Defer to OT evaluation    Lower Extremity Assessment Lower Extremity Assessment: Generalized weakness (Decreased AROM bilateral knees - especially flexion)    Cervical / Trunk Assessment Cervical / Trunk Assessment: Other exceptions Cervical / Trunk Exceptions: post-op back surgery  Communication   Communication: No difficulties  Cognition Arousal/Alertness: Awake/alert Behavior During Therapy: WFL for tasks assessed/performed Overall Cognitive Status: Within Functional Limits for tasks assessed                      General Comments      Exercises     Assessment/Plan    PT Assessment Patent does not need any further PT services  PT Problem List            PT Treatment Interventions      PT Goals (Current goals can be found in the Care Plan section)  Acute Rehab PT Goals Patient Stated Goal: Decrease pain so he can bend knees while walking PT Goal Formulation: All assessment and education complete, DC therapy    Frequency     Barriers to discharge        Co-evaluation               End of Session   Activity Tolerance: Patient tolerated treatment well Patient left: with call bell/phone within reach;with family/visitor present (Sitting EOB) Nurse Communication: Mobility status    Functional Assessment Tool Used: Clinical judgement Functional Limitation: Mobility: Walking and moving around Mobility: Walking and Moving Around Current Status [id number]): At least 1 percent but less than 20 percent impaired, limited or restricted Mobility: Walking and Moving Around Goal Status [phone]): At least 1 percent but less than 20 percent impaired, limited or restricted Mobility: Walking and Moving Around Discharge Status [phone]): At least 1 percent but less than 20 percent impaired, limited or restricted    Time: 0757-0817 PT Time Calculation (min) (ACUTE ONLY): 20 min   Charges:   PT Evaluation $PT Eval Moderate  Complexity: 1 Procedure     PT G Codes:   PT G-Codes **NOT FOR INPATIENT CLASS** Functional Assessment Tool Used: Clinical judgement Functional Limitation: Mobility: Walking and moving around Mobility: Walking and Moving Around Current Status [id number]): At least 1 percent but less than 20 percent impaired, limited or restricted Mobility: Walking and Moving Around Goal Status [phone]): At least 1 percent but less than 20 percent impaired, limited or restricted Mobility: Walking and Moving Around Discharge Status [phone]): At least 1 percent but less than 20 percent impaired, limited or restricted    Thelma Comp 12/07/2016, 9:26 AM  Rolinda Roan, PT, DPT Acute Rehabilitation Services Pager: [phone]

## 2016-12-10 NOTE — Anesthesia Postprocedure Evaluation (Addendum)
Anesthesia Post Note  Patient: Cole Furrh  Procedure(s) Performed: Procedure(s) (LRB): KYPHOPLASTY L4  (N/A) L4-S1 decompression right side  (Right)  Patient location during evaluation: PACU Anesthesia Type: General Level of consciousness: awake and sedated Pain management: pain level controlled Vital Signs Assessment: post-procedure vital signs reviewed and stable Respiratory status: spontaneous breathing, nonlabored ventilation, respiratory function stable and patient connected to nasal cannula oxygen Cardiovascular status: blood pressure returned to baseline and stable Postop Assessment: no signs of nausea or vomiting Anesthetic complications: no       Last Vitals:  Vitals:   12/07/16 0410 12/07/16 0746  BP: 131/80 (!) 152/96  Pulse: 78 85  Resp: 20 18  Temp: 36.4 C 36.6 C    Last Pain:  Vitals:   12/07/16 0920  TempSrc:   PainSc: 4                  Mikaelah Trostle,JAMES TERRILL

## 2016-12-17 DIAGNOSIS — G4733 Obstructive sleep apnea (adult) (pediatric): Secondary | ICD-10-CM | POA: Diagnosis not present

## 2016-12-17 LAB — CUP PACEART REMOTE DEVICE CHECK
Date Time Interrogation Session: 20171228193956
Implantable Pulse Generator Implant Date: 20170601

## 2016-12-19 NOTE — Discharge Summary (Signed)
Physician Discharge Summary  Patient ID: Katie Schowalter MRN: [id number] DOB/AGE: [birth year] 61 y.o.  Admit date: 12/06/2016 Discharge date: 12/19/2016  Admission Diagnoses:  Spinal stenosis and L4 compression fracture  Discharge Diagnoses:  Active Problems:   Back pain   Past Medical History:  Diagnosis Date  . Arthritis   . CAD (coronary artery disease)    a. s/p prior MI; hx of stent to RCA and LAD;  b. LHC 12/2006:  LM 20%, pLAD 40%, mLAD stent ok, apical LAD 90% (1.5-52mm vessel), oD1 40-50%, AV groove CFX 30%, oOM1 30%, pRCA 30% (multiple), mRCA stent ok with 30-40% ISR, dRCA 30%, dAnt, inf-apical severe HK, EF 40% => med Rx.;  12/2013 Cath: LM nl, LAD 95ost, 98m ISR, 99apex, LCX nl, RCA 40p, 100 ISR, L->R collats, EF 35-40%-->pending CABG  . Chronic back pain    lumbar compression fracture and stenosis  . GERD (gastroesophageal reflux disease)    occ  . Hypertension    takes Losartan,HCTZ,  and Metoprolol daily  . Ischemic cardiomyopathy    a. Echo 09/2012:  Mod LVH, focal basal hypertrophy, EF 30-35%, mid to dist inf-lat HK, Gr 1 diast dysfn, mild to mod LAE  . Mixed hyperlipidemia    takes Atorvastatin daily  . Myocardial infarction 2015   x2  . Peripheral vascular disease ([place])   . Sleep apnea    not using cpap at present had new test and getting new cpap  . Stroke ([place]) 03/2016  . Urinary frequency    takes Flomax daily    Surgeries: Procedure(s): KYPHOPLASTY L4  L4-S1 decompression right side  on 12/06/2016   Consultants (if any):   Discharged Condition: Improved  Hospital Course: Jerrell Ashmead is an 61 y.o. male who was admitted 12/06/2016 with a diagnosis of Lumbar spinal stenosis and Compression fracture at L4 and went to the operating room on 12/06/2016 and underwent the above named procedures.  Post op day 1 pt reports decreased leg pain.  Pt reports incisional pain controlled on oral medications.  Pt voiding w/o difficulty.  Pt cleared by OT for discharge.  Pt  ambulating in hall.  He was given perioperative antibiotics:  Anti-infectives    Start     Dose/Rate Route Frequency Ordered Stop   12/06/16 2000  ceFAZolin (ANCEF) IVPB 2g/100 mL premix     2 g 200 mL/hr over 30 Minutes Intravenous Every 8 hours 12/06/16 1838 12/07/16 0332   12/06/16 1019  ceFAZolin (ANCEF) 2-4 GM/100ML-% IVPB    Comments:  Schonewitz, Leigh   : cabinet override      12/06/16 1019 12/06/16 1226   12/06/16 1014  ceFAZolin (ANCEF) IVPB 2g/100 mL premix     2 g 200 mL/hr over 30 Minutes Intravenous 30 min pre-op 12/06/16 1014 12/06/16 1226    .  He was given sequential compression devices, early ambulation, and TED for DVT prophylaxis.  He benefited maximally from the hospital stay and there were no complications.    Recent vital signs:  Vitals:   12/07/16 0410 12/07/16 0746  BP: 131/80 (!) 152/96  Pulse: 78 85  Resp: 20 18  Temp: 97.6 F (36.4 C) 97.8 F (36.6 C)    Recent laboratory studies:  Lab Results  Component Value Date   HGB 14.0 11/28/2016   HGB 13.5 06/11/2016   HGB 18.7 (H) 03/16/2016   Lab Results  Component Value Date   WBC 8.7 11/28/2016   PLT 169 11/28/2016   Lab Results  Component Value  Date   INR 1.39 01/01/2014   Lab Results  Component Value Date   NA 140 11/28/2016   K 3.4 (L) 11/28/2016   CL 105 11/28/2016   CO2 25 11/28/2016   BUN 12 11/28/2016   CREATININE 0.94 11/28/2016   GLUCOSE 118 (H) 11/28/2016    Discharge Medications:   Allergies as of 12/07/2016      Reactions   Hydrocodone    Throat itchy, weakness- took this during stroke not sure if caused problem      Medication List    TAKE these medications   aspirin EC 81 MG tablet Take 81 mg by mouth daily.   atorvastatin 40 MG tablet Commonly known as:  LIPITOR Take 1 tablet (40 mg total) by mouth daily at 6 PM.   carvedilol 3.125 MG tablet Commonly known as:  COREG Take 1 tablet (3.125 mg total) by mouth daily.   hydrochlorothiazide 12.5 MG  capsule Commonly known as:  MICROZIDE Take 1 capsule (12.5 mg total) by mouth daily.   losartan 100 MG tablet Commonly known as:  COZAAR Take 1 tablet (100 mg total) by mouth daily.   methocarbamol 500 MG tablet Commonly known as:  ROBAXIN Take 1 tablet (500 mg total) by mouth 3 (three) times daily as needed for muscle spasms.   metoprolol tartrate 25 MG tablet Commonly known as:  LOPRESSOR Take 25 mg by mouth daily.   ondansetron 4 MG tablet Commonly known as:  ZOFRAN Take 1 tablet (4 mg total) by mouth every 8 (eight) hours as needed for nausea or vomiting.   oxyCODONE-acetaminophen 10-325 MG tablet Commonly known as:  PERCOCET Take 1 tablet by mouth every 4 (four) hours as needed for pain.   tamsulosin 0.4 MG Caps capsule Commonly known as:  FLOMAX Take 0.4 mg by mouth daily.       Diagnostic Studies: Dg Lumbar Spine 2-3 Views  Result Date: 12/06/2016 CLINICAL DATA:  Kyphoplasty L4 level EXAM: LUMBAR SPINE - 2-3 VIEW; DG C-ARM 61-120 MIN COMPARISON:  None. FINDINGS: Four views of the lumbar spine submitted. Compression deformity is noted upper endplate of L4 vertebral body. Kyphoplasty is performed at L4 level. The alignment is preserved. IMPRESSION: Kyphoplasty at L4 level with alignment preserved. Fluoroscopy time was 1 minute 5 seconds. Please see the operative report. Electronically Signed   By: Lahoma Crocker M.D.   On: 12/06/2016 17:01   Dg C-arm 1-60 Min  Result Date: 12/06/2016 CLINICAL DATA:  Kyphoplasty L4 level EXAM: LUMBAR SPINE - 2-3 VIEW; DG C-ARM 61-120 MIN COMPARISON:  None. FINDINGS: Four views of the lumbar spine submitted. Compression deformity is noted upper endplate of L4 vertebral body. Kyphoplasty is performed at L4 level. The alignment is preserved. IMPRESSION: Kyphoplasty at L4 level with alignment preserved. Fluoroscopy time was 1 minute 5 seconds. Please see the operative report. Electronically Signed   By: Lahoma Crocker M.D.   On: 12/06/2016 17:01     Disposition: 01-Home or Self Care Post op medications provided Pt will present to clinic in 2 weeks Pt counseled to stop smoking   Follow-up Information    BROOKS,DAHARI D, MD. Schedule an appointment as soon as possible for a visit in 2 weeks.   Specialty:  Orthopedic Surgery Why:  If symptoms worsen, For suture removal, For wound re-check Contact information: [address] [place] [address] [phone]            Signed: Valinda Hoar 12/19/2016, 7:21 AM

## 2016-12-24 DIAGNOSIS — S32040S Wedge compression fracture of fourth lumbar vertebra, sequela: Secondary | ICD-10-CM | POA: Diagnosis not present

## 2016-12-31 ENCOUNTER — Ambulatory Visit (INDEPENDENT_AMBULATORY_CARE_PROVIDER_SITE_OTHER): Payer: PPO | Admitting: *Deleted

## 2016-12-31 DIAGNOSIS — I63212 Cerebral infarction due to unspecified occlusion or stenosis of left vertebral arteries: Secondary | ICD-10-CM

## 2017-01-01 LAB — CUP PACEART REMOTE DEVICE CHECK
Date Time Interrogation Session: 20180127193918
Implantable Pulse Generator Implant Date: 20170601

## 2017-01-01 NOTE — Progress Notes (Signed)
[facility] summary report received. Battery status OK. Normal device function. No new symptom episodes, tachy episodes, brady, or pause episodes. No new AF episodes. Monthly summary reports and ROV/PRN 

## 2017-01-01 NOTE — Progress Notes (Signed)
[facility] Summary Report / Loop Recorder 

## 2017-01-09 ENCOUNTER — Ambulatory Visit: Payer: PPO | Admitting: Adult Health

## 2017-01-14 DIAGNOSIS — G4733 Obstructive sleep apnea (adult) (pediatric): Secondary | ICD-10-CM | POA: Diagnosis not present

## 2017-01-16 LAB — CUP PACEART REMOTE DEVICE CHECK
Date Time Interrogation Session: 20180226194426
Implantable Pulse Generator Implant Date: 20170601

## 2017-01-24 DIAGNOSIS — J209 Acute bronchitis, unspecified: Secondary | ICD-10-CM | POA: Diagnosis not present

## 2017-01-28 DIAGNOSIS — Z9889 Other specified postprocedural states: Secondary | ICD-10-CM | POA: Diagnosis not present

## 2017-01-28 DIAGNOSIS — M545 Low back pain: Secondary | ICD-10-CM | POA: Diagnosis not present

## 2017-01-28 DIAGNOSIS — Z4789 Encounter for other orthopedic aftercare: Secondary | ICD-10-CM | POA: Diagnosis not present

## 2017-01-28 DIAGNOSIS — S32040S Wedge compression fracture of fourth lumbar vertebra, sequela: Secondary | ICD-10-CM | POA: Diagnosis not present

## 2017-01-30 ENCOUNTER — Ambulatory Visit (INDEPENDENT_AMBULATORY_CARE_PROVIDER_SITE_OTHER): Payer: PPO | Admitting: *Deleted

## 2017-01-30 DIAGNOSIS — I63212 Cerebral infarction due to unspecified occlusion or stenosis of left vertebral arteries: Secondary | ICD-10-CM

## 2017-01-31 NOTE — Progress Notes (Signed)
[facility] Summary Report / Loop Recorder 

## 2017-02-07 ENCOUNTER — Other Ambulatory Visit: Payer: Self-pay | Admitting: Cardiovascular Disease

## 2017-02-07 ENCOUNTER — Ambulatory Visit: Payer: PPO | Admitting: Adult Health

## 2017-02-07 NOTE — Telephone Encounter (Signed)
Editor: Michaelyn Barter, RN (Registered Nurse)  Prior Versions: 1. Michaelyn Barter, RN (Registered Nurse) at 08/13/2016 9:37 AM - Addendum   2. Michaelyn Barter, RN (Registered Nurse) at 08/13/2016 9:36 AM - Addendum   3. Michaelyn Barter, RN (Registered Nurse) at 08/13/2016 9:29 AM - Signed    Medication Instructions:  Your physician has recommended you make the following change in your medication:  1-STOP Norvasc 2-STOP Plavix 3-START Hydroclorithizide 12.5 mg by mouth daily

## 2017-02-14 DIAGNOSIS — G4733 Obstructive sleep apnea (adult) (pediatric): Secondary | ICD-10-CM | POA: Diagnosis not present

## 2017-02-14 LAB — CUP PACEART REMOTE DEVICE CHECK
Date Time Interrogation Session: 20180328201036
Implantable Pulse Generator Implant Date: 20170601

## 2017-02-21 ENCOUNTER — Telehealth: Payer: Self-pay | Admitting: Cardiology

## 2017-02-21 NOTE — Telephone Encounter (Signed)
Spoke w/ pt and requested that he send a manual transmission b/c his home monitor has not updated in at least 14 days.   

## 2017-02-27 NOTE — Progress Notes (Deleted)
Patient ID: [name], male   DOB: 20-Jul-1956, 61 y.o.   MRN: [id number]   61 y.o. male with history of CAD s/p bare metal stent to RCA in 2002 and 2008 and bare metal stent LAD in 2002 and 2008, ICM (EF ~ 40%), HTN, HL, OSA, GERD, tobacco abuse 2015 stress myoview with anterior and inferior wall scar with reduced LVEF, no clear ischemia. Cardiac cath done 12/22/13 with progressive disease and CABG recommended   CABG 01/01/14  with Dr [name] This included LIMA to LAD and SVG to PDA EF 30-35% by echo   Has not smoked since surgery   Echo 05/27/14 EF 35-40%  MUGA  06/10/14  EF 43% 10/26/15  ABI's normal    Seein by Doctor [name] and felt to be not a candidate for CRT or AICD   03/15/16  Presenting with new onset vertigo and slurred speech as well as unstable gait. He is also had noticeable difficulty with swallowing. Onset of symptoms was at 5 PM on 03/15/2016. He has no previous history of stroke nor TIA. He's been taking aspirin daily. MRI of the brain showed acute left PICA distribution infarcts involving cerebellum and left lateral medulla. 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 There was mild focal  basal hypertrophy of the septum. Systolic function was moderately  reduced. The estimated ejection fraction was in the range of 35%  to 40%. Dyskinesis and aneurysmal deformity of the  mid-apicalanteroseptal, inferior, inferoseptal, and apical  myocardium. Doppler parameters are consistent with abnormal left  ventricular relaxation (grade 1 diastolic dysfunction). Acoustic  contrast opacification revealed no evidence ofthrombus. - Left atrium: The atrium was mildly dilated. -  Atrial septum: No defect or patent foramen ovale was identified.  Impressions:  - Although no LV apical thrombus is seen curremtly, the apical  dyskinesis appears to be a high risk finding for apical  thrombosis and embolic events.  Walking and balance better after in home PT done   Still smoking a bit  Referred to EP for ILR to r/o occult PAF Inserted by Dr [name] 04/05/16    Reviewed device with [name]  No alarms   Edema with norvasc. Stopped and started on diuretic for BP Last visit Plavix stopped after 3 months as etiology of CVA thought To be dissection and patient had easy bruising.    05/06/08 Uncomplicated back surgery with Dr [name]  L4 kyphoplasty and right L45 hemilaminotomy with diskectomy  ROS: Denies fever, malais, weight loss, blurry vision, decreased visual acuity, cough, sputum, SOB, hemoptysis, pleuritic pain, palpitaitons, heartburn, abdominal pain, melena, lower extremity edema, claudication, or rash.  All other systems reviewed and negative  General: Affect appropriate Healthy:  appears stated age 33: normal Neck supple with no adenopathy JVP normal no bruits no thyromegaly Lungs clear with no wheezing and good diaphragmatic motion Heart:  S1/S2 no murmur, no rub, gallop or click PMI normal  ILR subQ left chest  Abdomen: benighn, BS positve, no tenderness, no AAA no bruit.  No HSM or HJR Distal pulses intact with no bruits No edema Neuro non-focal Skin warm and dry No muscular weakness   Current Outpatient Prescriptions  Medication  Sig Dispense Refill  . aspirin EC 81 MG tablet Take 81 mg by mouth daily.    [name] atorvastatin (LIPITOR) 40 MG tablet Take 1 tablet (40 mg total) by mouth daily at 6 PM. 90 tablet 3  . carvedilol (COREG) 3.125 MG tablet Take 1 tablet (3.125 mg total) by mouth daily. (Patient not taking: Reported on 11/22/2016) 90 tablet 3  . hydrochlorothiazide (MICROZIDE) 12.5 MG capsule Take 1 capsule (12.5 mg total) by mouth daily. 90  capsule 3  . losartan (COZAAR) 100 MG tablet Take 1 tablet (100 mg total) by mouth daily. 90 tablet 3  . methocarbamol (ROBAXIN) 500 MG tablet Take 1 tablet (500 mg total) by mouth 3 (three) times daily as needed for muscle spasms. 21 tablet 0  . metoprolol tartrate (LOPRESSOR) 25 MG tablet Take 25 mg by mouth daily.     . ondansetron (ZOFRAN) 4 MG tablet Take 1 tablet (4 mg total) by mouth every 8 (eight) hours as needed for nausea or vomiting. 20 tablet 0  . oxyCODONE-acetaminophen (PERCOCET) 10-325 MG tablet Take 1 tablet by mouth every 4 (four) hours as needed for pain. 42 tablet 0  . tamsulosin (FLOMAX) 0.4 MG CAPS capsule Take 0.4 mg by mouth daily.     No current facility-administered medications for this visit.     Allergies  Hydrocodone  Electrocardiogram:  06/24/14  SR rate 62 anterolateral MI with persistent lateral T wave inversions  QRS normal    07/07/15 SR ate 73  Anterolateral T wave changes persist   Assessment and Plan Cerebellar Stroke:   PT important D/C smoking important will call chantix in .  Loop recorder inserted  To r/o PAF but also had vertebral artery dissection  ILR no alarms reviewed one false afib recording was sinus He will check signal strength for box at home to see if he needs repeater to tansmit  D/C plavix   CAD: Stable with no angina and good activity level.  Continue lopressor and aSA  DCM  euvolemic functional class one continue current meds EF 35-40% echo 03/18/16  Chol:  Lab Results  Component Value Date   LDLCALC 118 (H) 03/17/2016   lasbs with Dr [name] HTN: *** Carpel Tunnel:  Vs cervical spine disease with see Dr [name] to get MRI of c spine   Back Pain:    F/u Dr [name]  Post laminectomy with improvement  Edema:  Better off norvasc and with diuretic    [name]

## 2017-02-28 ENCOUNTER — Encounter: Payer: Self-pay | Admitting: Cardiology

## 2017-02-28 ENCOUNTER — Ambulatory Visit: Payer: PPO | Admitting: Cardiovascular Disease

## 2017-03-01 ENCOUNTER — Ambulatory Visit (INDEPENDENT_AMBULATORY_CARE_PROVIDER_SITE_OTHER): Payer: PPO | Admitting: *Deleted

## 2017-03-01 DIAGNOSIS — I63212 Cerebral infarction due to unspecified occlusion or stenosis of left vertebral arteries: Secondary | ICD-10-CM | POA: Diagnosis not present

## 2017-03-04 NOTE — Progress Notes (Signed)
[facility] Summary Report / Loop Recorder 

## 2017-03-07 NOTE — Progress Notes (Signed)
Patient ID: [name], male   DOB: 09/14/1956, 61 y.o.   MRN: [id number]   61 y.o. male with history of CAD s/p bare metal stent to RCA in 2002 and 2008 and bare metal stent LAD in 2002 and 2008, ICM (EF ~ 40%), HTN, HL, OSA, GERD, tobacco abuse 2015 stress myoview with anterior and inferior wall scar with reduced LVEF, no clear ischemia. Cardiac cath done 12/22/13 with progressive disease and CABG recommended   CABG 01/01/14  with Dr Cyndia Bent This included LIMA to LAD and SVG to PDA EF 30-35% by echo   Has not smoked since surgery   Echo 05/27/14 EF 35-40%  MUGA  06/10/14  EF 43% 10/26/15  ABI's normal    Seein by Doctor Allred and felt to be not a candidate for CRT or AICD   03/15/16  Presenting with new onset vertigo and slurred speech as well as unstable gait. He is also had noticeable difficulty with swallowing. Onset of symptoms was at 5 PM on 03/15/2016. He has no previous history of stroke nor TIA. He's been taking aspirin daily. MRI of the brain showed acute left PICA distribution infarcts involving cerebellum and left lateral medulla. 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 There was mild focal  basal hypertrophy of the septum. Systolic function was moderately  reduced. The estimated ejection fraction was in the range of 35%  to 40%. Dyskinesis and aneurysmal deformity of the  mid-apicalanteroseptal, inferior, inferoseptal, and apical  myocardium. Doppler parameters are consistent with abnormal left  ventricular relaxation (grade 1 diastolic dysfunction). Acoustic  contrast opacification revealed no evidence ofthrombus. - Left atrium: The atrium was mildly dilated. -  Atrial septum: No defect or patent foramen ovale was identified.  Impressions:  - Although no LV apical thrombus is seen curremtly, the apical  dyskinesis appears to be a high risk finding for apical  thrombosis and embolic events.  Walking and balance better after in home PT done   Still smoking a bit  Referred to EP for ILR to r/o occult PAF Inserted by Dr Lovena Le 04/05/16    Reviewed device with Raquel Sarna  No alarms   Edema with norvasc. Stopped and started on diuretic for BP Last visit Plavix stopped after 3 months as etiology of CVA thought To be dissection and patient had easy bruising.    06/06/94 Uncomplicated back surgery with Dr Rolena Infante  L4 kyphoplasty and right L45 hemilaminotomy with diskectomy  Thinks abdomen is swollen. Not urinating well mild LE edema Still off balance   ROS: Denies fever, malais, weight loss, blurry vision, decreased visual acuity, cough, sputum, SOB, hemoptysis, pleuritic pain, palpitaitons, heartburn, abdominal pain, melena, lower extremity edema, claudication, or rash.  All other systems reviewed and negative  General: BP on my exam 110/70  Affect appropriate Healthy:  appears stated age HEENT: normal Neck supple with no adenopathy JVP normal no bruits no thyromegaly Lungs clear with no wheezing and good diaphragmatic motion Heart:  S1/S2 no murmur, no rub, gallop or click PMI normal  ILR subQ left chest  Abdomen: distended not tender no rebound no bruit.  No HSM or HJR Distal pulses intact with no  bruits Plus one bilateral edema Neuro non-focal poor balance  Skin warm and dry No muscular weakness   Current Outpatient Prescriptions  Medication Sig Dispense Refill  . aspirin EC 81 MG tablet Take 81 mg by mouth daily.    [name] atorvastatin (LIPITOR) 40 MG tablet Take 1 tablet (40 mg total) by mouth daily at 6 PM. 90 tablet 3  . losartan (COZAAR) 100 MG tablet Take 1 tablet (100 mg total) by mouth daily. 90 tablet 3  . hydrochlorothiazide  (MICROZIDE) 12.5 MG capsule Take 1 capsule (12.5 mg total) by mouth daily. 90 capsule 3   No current facility-administered medications for this visit.     Allergies  Hydrocodone  Electrocardiogram:  06/24/14  SR rate 62 anterolateral MI with persistent lateral T wave inversions  QRS normal    07/07/15 SR ate 73  Anterolateral T wave changes persist 03/08/17  SR rate 79 LVH repol abnormality  Assessment and Plan  Cerebellar Stroke:   PT important D/C smoking important will call chantix in .  Loop recorder inserted  To r/o PAF but also had vertebral artery dissection  ILR no alarms reviewed one false afib recording was sinus Plavix d/c after 3 months   CAD: Stable with no angina and good activity level.  Continue lopressor and aSA  DCM  euvolemic functional class one continue current meds EF 35-40% echo 03/18/16  Not clear if his abdominal swelling is related to CHF will check labs including BNP And have him get non contrast abdominal CT to r/o ascites  Chol:  Lab Results  Component Value Date   LDLCALC 118 (H) 03/17/2016   lasbs with Dr Tollie Pizza HTN: Well controlled.  Continue current medications and low sodium Dash type diet.   Carpel Tunnel:  Vs cervical spine disease with see Dr Rolena Infante to get MRI of c spine   Back Pain:    F/u Dr Rolena Infante  Post laminectomy with improvement  Edema:  On diuretic check labs see above    Jenkins Rouge

## 2017-03-08 ENCOUNTER — Ambulatory Visit (INDEPENDENT_AMBULATORY_CARE_PROVIDER_SITE_OTHER): Payer: PPO | Admitting: Cardiovascular Disease

## 2017-03-08 ENCOUNTER — Encounter: Payer: Self-pay | Admitting: Cardiovascular Disease

## 2017-03-08 ENCOUNTER — Encounter (INDEPENDENT_AMBULATORY_CARE_PROVIDER_SITE_OTHER): Payer: Self-pay

## 2017-03-08 VITALS — BP 86/54 | HR 77 | Ht 68.0 in | Wt 185.0 lb

## 2017-03-08 DIAGNOSIS — I5043 Acute on chronic combined systolic (congestive) and diastolic (congestive) heart failure: Secondary | ICD-10-CM | POA: Diagnosis not present

## 2017-03-08 DIAGNOSIS — R188 Other ascites: Secondary | ICD-10-CM | POA: Diagnosis not present

## 2017-03-08 DIAGNOSIS — S32040S Wedge compression fracture of fourth lumbar vertebra, sequela: Secondary | ICD-10-CM | POA: Diagnosis not present

## 2017-03-08 DIAGNOSIS — I429 Cardiomyopathy, unspecified: Secondary | ICD-10-CM | POA: Diagnosis not present

## 2017-03-08 DIAGNOSIS — R0602 Shortness of breath: Secondary | ICD-10-CM

## 2017-03-08 LAB — CBC WITH DIFFERENTIAL/PLATELET

## 2017-03-08 NOTE — Patient Instructions (Addendum)
Medication Instructions:  Your physician recommends that you continue on your current medications as directed. Please refer to the Current Medication list given to you today.  Labwork: Your physician recommends that you have lab work today- CMET, CBC, BNP   Testing/Procedures: Non-Cardiac CT scanning of Abdomen, (CAT scanning), is a noninvasive, special x-ray that produces cross-sectional images of the body using x-rays and a computer. CT scans help physicians diagnose and treat medical conditions. For some CT exams, a contrast material is used to enhance visibility in the area of the body being studied. CT scans provide greater clarity and reveal more details than regular x-ray exams.  Follow-Up: Your physician wants you to follow-up in: 6 months with Dr. Johnsie Cancel. You will receive a reminder letter in the mail two months in advance. If you don't receive a letter, please call our office to schedule the follow-up appointment.   If you need a refill on your cardiac medications before your next appointment, please call your pharmacy.

## 2017-03-11 ENCOUNTER — Telehealth: Payer: Self-pay

## 2017-03-11 LAB — PRO B NATRIURETIC PEPTIDE: NT-Pro BNP: 118 pg/mL (ref 0–210)

## 2017-03-11 LAB — CBC WITH DIFFERENTIAL/PLATELET
Basophils Absolute: 0.1 10*3/uL (ref 0.0–0.2)
Basos: 1 %
EOS (ABSOLUTE): 0.6 10*3/uL — ABNORMAL HIGH (ref 0.0–0.4)
Eos: 6 %
Hematocrit: 40.7 % (ref 37.5–51.0)
Hemoglobin: 13.8 g/dL (ref 13.0–17.7)
Immature Grans (Abs): 0 10*3/uL
Immature Granulocytes: 0 %
Lymphocytes Absolute: 2.7 10*3/uL (ref 0.7–3.1)
Lymphs: 30 %
MCH: 29.4 pg (ref 26.6–33.0)
MCHC: 33.9 g/dL (ref 31.5–35.7)
MCV: 87 fL (ref 79–97)
Monocytes Absolute: 0.9 10*3/uL (ref 0.1–0.9)
Monocytes: 10 %
NRBC: 0 %
Neutrophils Absolute: 4.7 10*3/uL (ref 1.4–7.0)
Neutrophils: 53 %
Platelets: 197 10*3/uL (ref 150–379)
RBC: 4.69 x10E6/uL (ref 4.14–5.80)
RDW: 13.6 % (ref 12.3–15.4)
WBC: 9 10*3/uL (ref 3.4–10.8)

## 2017-03-11 LAB — COMPREHENSIVE METABOLIC PANEL
ALT: 16 IU/L (ref 0–44)
AST: 17 IU/L (ref 0–40)
Albumin/Globulin Ratio: 2 (ref 1.2–2.2)
Albumin: 4.3 g/dL (ref 3.6–4.8)
Alkaline Phosphatase: 86 IU/L (ref 39–117)
BUN/Creatinine Ratio: 15 (ref 10–24)
BUN: 16 mg/dL (ref 8–27)
Bilirubin Total: 0.6 mg/dL (ref 0.0–1.2)
CO2: 20 mmol/L (ref 18–29)
Calcium: 9.5 mg/dL (ref 8.6–10.2)
Chloride: 101 mmol/L (ref 96–106)
Creatinine, Ser: 1.08 mg/dL (ref 0.76–1.27)
GFR calc Af Amer: 85 mL/min/{1.73_m2} (ref >59)
GFR calc non Af Amer: 74 mL/min/{1.73_m2} (ref >59)
Globulin, Total: 2.2 g/dL (ref 1.5–4.5)
Glucose: 102 mg/dL — ABNORMAL HIGH (ref 65–99)
Potassium: 3.9 mmol/L (ref 3.5–5.2)
Sodium: 142 mmol/L (ref 134–144)
Total Protein: 6.5 g/dL (ref 6.0–8.5)

## 2017-03-11 NOTE — Telephone Encounter (Signed)
Patient aware of lab results with verbal understanding. 

## 2017-03-11 NOTE — Telephone Encounter (Signed)
-----   Message from Josue Hector, MD sent at 03/11/2017 12:52 PM EDT ----- Labs normal including BNP

## 2017-03-14 LAB — CUP PACEART REMOTE DEVICE CHECK
Date Time Interrogation Session: 20180427203752
Implantable Pulse Generator Implant Date: 20170601

## 2017-03-15 ENCOUNTER — Encounter: Payer: Self-pay | Admitting: Cardiology

## 2017-03-16 DIAGNOSIS — G4733 Obstructive sleep apnea (adult) (pediatric): Secondary | ICD-10-CM | POA: Diagnosis not present

## 2017-03-19 ENCOUNTER — Telehealth: Payer: Self-pay

## 2017-03-19 ENCOUNTER — Ambulatory Visit (INDEPENDENT_AMBULATORY_CARE_PROVIDER_SITE_OTHER): Payer: PPO | Admitting: Adult Health

## 2017-03-19 ENCOUNTER — Encounter: Payer: Self-pay | Admitting: Adult Health

## 2017-03-19 VITALS — BP 95/66 | HR 69 | Resp 16 | Ht 68.0 in | Wt 184.0 lb

## 2017-03-19 DIAGNOSIS — Z9989 Dependence on other enabling machines and devices: Secondary | ICD-10-CM

## 2017-03-19 DIAGNOSIS — G4733 Obstructive sleep apnea (adult) (pediatric): Secondary | ICD-10-CM | POA: Diagnosis not present

## 2017-03-19 DIAGNOSIS — Z8673 Personal history of transient ischemic attack (TIA), and cerebral infarction without residual deficits: Secondary | ICD-10-CM | POA: Diagnosis not present

## 2017-03-19 MED ORDER — NITROGLYCERIN 0.4 MG SL SUBL: 0.4000 mg | SUBLINGUAL_TABLET | SUBLINGUAL | 3 refills | Status: DC | PRN

## 2017-03-19 NOTE — Telephone Encounter (Signed)
Patient has not been on Nitro since 01/2014. Patient has history of low BP and HR. Will forward to Dr. Johnsie Cancel to advise.

## 2017-03-19 NOTE — Telephone Encounter (Signed)
-----   Message from Osvaldo Shipper, [place] sent at 03/19/2017  8:31 AM EDT ----- Regarding: Refil for Medication  [name]   [name] came in today to pick up instructions for his CT and he wanted me to let you know he needed his Nitro faxed into [facility] in [place].   Thanks [employer]

## 2017-03-19 NOTE — Patient Instructions (Signed)
We will have you come back in for CPAP titration If your symptoms worsen or you develop new symptoms please let us know.

## 2017-03-19 NOTE — Telephone Encounter (Signed)
Ok to call in SL nitro

## 2017-03-19 NOTE — Progress Notes (Addendum)
PATIENT: [name] DOB: 11/13/1955  REASON FOR VISIT: follow up- OSA on CPAP HISTORY FROM: patient  HISTORY OF PRESENT ILLNESS: Today 03/19/2017 [name] is a 61 year old male with a history of stroke and obstructive sleep apnea. He returns today for another CPAP download. His download indicates that he used his machine 22 out of 30 days for compliance of 73%. He uses machine greater than 4 hours 5 out of 30 days for compliance of 17%. His residual AHI is 40.2 on 10 cm water with EPR of 3. He states that he did go approximately 1 week without using the machine as he was out camping at [employer] and did not have electricity. His leak in the 95th percentile is 19.9 L/m. He is currently using the nasal pillows. He does not feel that his mask is leaking at night. He does state that he gets up frequently during the night and feels that is why he is unable to use a greater than 4 hours every night. He returns today for an evaluation.  HISTORY 11/08/16 Copied from [name] notes: [name] is a 61 year old right-handed gentleman with an underlying medical history of hyperlipidemia, reflux disease, hypertension, heart disease, status post MI, status post stent placement, status post 2 vessel CABG in February 2015, smoking with recent cessation in April2017, arthritis, recent left cerebellar and left lateral medulla stroke in May 2017 (secondary to left PICA infarction and evidence of left vertebral artery occlusion), status post loop recorder implantation, who presents for follow-up consultation of his obstructive sleep apnea, after his split-night sleep study. The patient is unaccompanied today. I first met him on 05/22/2016 at the request of [name], at which time he reported a prior diagnosis of OSA. Given his medical history and recent history of stroke I asked him to return for sleep apnea testing with a sleep study. He had a split-night sleep study on 06/07/2016. I went over his test  results with him in detail. Baseline sleep efficiency was 96.8%, with a latency to sleep of 1 minutes and wake after sleep onset of 3.5 minutes. He had a REM latency of 85 minutes. Moderate to loud snoring was noted. Total AHI was 37.6 per hour, average oxygen saturation was 93%, nadir was 86%. He was then titrated with CPAP. Sleep efficiency during the second part of the study was 84.4%. He had REM sleep at 10.8%. Average oxygen saturation was 94%, nadir was 90%. Snoring was eliminated. CPAP was titrated from 5 cm to 9 cm. AHI was 0.4 per hour at the final pressure with brief supine REM sleep achieved. Based on his test results are prescribed CPAP therapy for home use.  Today, 11/08/2016: I reviewed his CPAP compliance data from 10/08/2016 through 11-2016 which is a total of 30 days, during which time he used his machine 26 days with percent used days greater than 4 hours at 20% only, indicating poor compliance with an average usage of 3 hours of 34 minutes only, residual AHI elevated at 41.4 per hour, leak on the high side for the 95th percentile at 24.2 L/m on a pressure of 9 cm with EPR of 3. Residual AHI elevated secondary to obstructive apneic events according to the CPAP download.  Today, 11/08/2016: He reports doing well with CPAP, does not sleep very much, has a tendency to sleep some w/o the PAP, in the morning after going to bathroom. He still smokes, does not drink enough water he admits. He went to the  [place] for a few days and did not have access to electricity so did not take his machine with him. Otherwise, he has adjusted well to CPAP and does not mind using it. He has no new complaints, low back pain is bothering him and he has a follow-up with his spine surgeon. He did well after his neck surgery. Overall, he feels he has a better experience with CPAP this time around and is willing to continue to use it.   Previously:   05/22/2016: He was previously diagnosed with obstructive sleep  apnea. He has not been using CPAP therapy in years, probably 6-7 years. Prior sleep test results are not available for my review. A CPAP compliance download is not available for my review today. He has not had a sleep study testing was probably 10-15 years ago. He was told he had severe obstructive sleep apnea.  I reviewed your office note from 05/15/2016.  He had a brain MRI without contrast on 03/16/2016 as well as MRA head and MRA neck with and without contrast and an MRV on the same day: IMPRESSION: Left vertebral artery occlusion. Acute left PICA distribution infarction affecting the left lateral medulla and areas of the left cerebellum. No swelling or hemorrhage. The remainder the brain is normal. Normal MR venography.  His Epworth sleepiness score is 11 out of 24 today, his fatigue score is 46 out of 63. He does report interim weight loss since his original sleep apnea diagnosis.  His bedtime varies, 9-11 PM, and falling asleep is not a problem, but staying asleep can be. He reports nocturia, on average 2-3 times per night. He drinks caffeine in the form of coffee, about 2 cups per day and one bottle of [facility] per day on average. He drinks alcohol occasionally in the form of beer, reports none since the stroke. He has neck pain and low back pain. He has seen [name] for these issues and was supposed to have neck surgery the week of his stroke.   REVIEW OF SYSTEMS: Out of a complete 14 system review of symptoms, the patient complains only of the following symptoms, and all other reviewed systems are negative.  Eye itching, eye redness, leg swelling, frequent waking, walking difficulty, swollen abdomen, heat intolerance, difficulty urinating, bruise/bleed easily, dizziness, numbness  ALLERGIES: Allergies  Allergen Reactions  . Hydrocodone     Throat itchy, weakness- took this during stroke not sure if caused problem    HOME MEDICATIONS: Outpatient Medications Prior to Visit    Medication Sig Dispense Refill  . aspirin EC 81 MG tablet Take 81 mg by mouth daily.    [name] atorvastatin (LIPITOR) 40 MG tablet Take 1 tablet (40 mg total) by mouth daily at 6 PM. 90 tablet 3  . losartan (COZAAR) 100 MG tablet Take 1 tablet (100 mg total) by mouth daily. 90 tablet 3  . hydrochlorothiazide (MICROZIDE) 12.5 MG capsule Take 1 capsule (12.5 mg total) by mouth daily. 90 capsule 3   No facility-administered medications prior to visit.     PAST MEDICAL HISTORY: Past Medical History:  Diagnosis Date  . Arthritis   . CAD (coronary artery disease)    a. s/p prior MI; hx of stent to RCA and LAD;  b. LHC 12/2006:  LM 20%, pLAD 40%, mLAD stent ok, apical LAD 90% (1.5-31m vessel), oD1 40-50%, AV groove CFX 30%, oOM1 30%, pRCA 30% (multiple), mRCA stent ok with 30-40% ISR, dRCA 30%, dAnt, inf-apical severe HK, EF 40% =>  med Rx.;  12/2013 Cath: LM nl, LAD 95ost, 43mISR, 99apex, LCX nl, RCA 40p, 100 ISR, L->R collats, EF 35-40%-->pending CABG  . Chronic back pain    lumbar compression fracture and stenosis  . GERD (gastroesophageal reflux disease)    occ  . Hypertension    takes Losartan,HCTZ,  and Metoprolol daily  . Ischemic cardiomyopathy    a. Echo 09/2012:  Mod LVH, focal basal hypertrophy, EF 30-35%, mid to dist inf-lat HK, Gr 1 diast dysfn, mild to mod LAE  . Mixed hyperlipidemia    takes Atorvastatin daily  . [facility] [place] [place]   x2  . [place]ipheral vascular disease ([facility]   . Sleep apnea    not using cpap at present had new test and getting new cpap  . [place] [place]  . Urinary frequency    takes Flomax daily    PAST SURGICAL HISTORY: Past Surgical History:  Procedure Laterality Date  . ANTERIOR CERVICAL DECOMP/DISCECTOMY FUSION N/A 06/14/2016   Procedure: ANTERIOR CERVICAL DECOMPRESSION/DISCECTOMY FUSION C4 - C5, CERVICAL 6-C7 2 LEVEL;  Surgeon: DMelina Schools MD;  [facility]  Service: Orthopedics;  Laterality: N/A;  . CARDIAC CATHETERIZATION   06/27/2001; 12/13/2006; 02/20/2010  . COLONOSCOPY    . CORONARY ANGIOPLASTY WITH STENT PLACEMENT  05/22/2001   PCI - RCA - LAD  . CORONARY ANGIOPLASTY WITH STENT PLACEMENT  11/11/2006   PCI - BMS - LAD  . CORONARY ANGIOPLASTY WITH STENT PLACEMENT  11/15/2006   PCI - BMS - RCA  . CORONARY ANGIOPLASTY WITH STENT PLACEMENT  03/16/2010  . CORONARY ARTERY BYPASS GRAFT N/A 01/01/2014   Procedure: Coronary artery bypass graft times two using left internal mammary artery and left leg greater saphenous vein harvested endoscopically.;  Surgeon: BGaye Pollack MD;  Location: [facility];  Service: Open Heart Surgery;  Laterality: N/A;  . EP IMPLANTABLE DEVICE N/A 04/05/2016   Procedure: Loop Recorder Insertion;  Surgeon: GEvans Lance MD;  [facility];  Service: Cardiovascular;  Laterality: N/A;  . FOOT SURGERY Right    achilles tendon reattached  . HAND SURGERY Right    screws  . INTRAOPERATIVE TRANSESOPHAGEAL ECHOCARDIOGRAM N/A 01/01/2014   Procedure: INTRAOPERATIVE TRANSESOPHAGEAL ECHOCARDIOGRAM;  Surgeon: BGaye Pollack MD;  [facility];  Service: Open Heart Surgery;  Laterality: N/A;  . KNEE ARTHROSCOPY     left knee x 1, right knee x 2  . KYPHOPLASTY N/A 12/06/2016   Procedure: KYPHOPLASTY L4 ;  Surgeon: DMelina Schools MD;  [place] [facility]  Service: Orthopedics;  Laterality: N/A;  Requests for 3 hours  . LEFT HEART CATHETERIZATION WITH CORONARY ANGIOGRAM N/A 12/21/2013   Procedure: LEFT HEART CATHETERIZATION WITH CORONARY ANGIOGRAM;  Surgeon: CBurnell Blanks MD;  [facility];  Service: Cardiovascular;  Laterality: N/A;  . LUMBAR LAMINECTOMY/DECOMPRESSION MICRODISCECTOMY Right 12/06/2016   Procedure: L4-S1 decompression right side ;  Surgeon: DMelina Schools MD;  [place] [facility]  Service: Orthopedics;  Laterality: Right;  . OPEN REDUCTION INTERNAL FIXATION (ORIF) SCAPHOID WITH ILIAC CREST BONE GRAFT  10/15/2012   Procedure: OPEN REDUCTION INTERNAL FIXATION (ORIF) SCAPHOID  WITH ILIAC CREST BONE GRAFT;  Surgeon: MSchuyler Amor MD;  [place] [facility]  Service: Orthopedics;  Laterality: Right;  No iliac creast bone graft taken, used graft from radius    FAMILY HISTORY: Family History  Problem Relation Age of Onset  . Heart disease Father   . Heart disease Maternal Grandmother   .  Heart disease Maternal Grandfather   . Heart disease Paternal Grandmother   . Heart disease Paternal Grandfather     SOCIAL HISTORY: Social History   Social History  . Marital status: Married    Spouse name: [name]  . Number of children: 2  . Years of education: 12   Occupational History  . Disabled     Social History Main Topics  . Smoking status: Former Smoker    Packs/day: 1.00    Years: 20.00    Types: Cigarettes  . Smokeless tobacco: Never Used     Comment: quit smoking 3-4 months ago  . Alcohol use No     Comment: nothing in 3-4 months  . Drug use: No  . Sexual activity: Not on file   Other Topics Concern  . Not on file   Social History Narrative   Lives with [name]   Caffeine use: Drinks coffee/tea/soda (2 cups coffee/morning)      PHYSICAL EXAM  Vitals:   03/19/17 0721  Resp: 16  Weight: 184 lb (83.5 kg)  Height: 5' 8"  (1.727 m)   Body mass index is 27.98 kg/m.  Generalized: Well developed, in no acute distress   Neurological examination  Mentation: Alert oriented to time, place, history taking. Follows all commands speech and language fluent Cranial nerve II-XII: Pupils were equal round reactive to light. Extraocular movements were full, visual field were full on confrontational test. Facial sensation and strength were normal. Uvula tongue midline. Head turning and shoulder shrug  were normal and symmetric. Neck circumference 16.75 inches Motor: The motor testing reveals 5 over 5 strength of all 4 extremities. Good symmetric motor tone is noted throughout.  Sensory: Sensory testing is intact to soft touch on all 4  extremities. No evidence of extinction is noted.  Coordination: Cerebellar testing reveals good finger-nose-finger bilaterally but difficulty with heel to shin due to mobility of the legs. Gait and station: Patient has difficulty standing from a seated position. He uses a cane when ambulating. Tandem gait not attended. Reflexes: Deep tendon reflexes are symmetric and normal bilaterally.   DIAGNOSTIC DATA (LABS, IMAGING, TESTING) - I reviewed patient records, labs, notes, testing and imaging myself where available.  Lab Results  Component Value Date   WBC WILL FOLLOW 03/08/2017   WBC 9.0 03/08/2017   HGB 14.0 11/28/2016   HCT WILL FOLLOW 03/08/2017   HCT 40.7 03/08/2017   MCV WILL FOLLOW 03/08/2017   MCV 87 03/08/2017   PLT WILL FOLLOW 03/08/2017   PLT 197 03/08/2017      Component Value Date/Time   NA 142 03/08/2017 1213   K 3.9 03/08/2017 1213   CL 101 03/08/2017 1213   CO2 20 03/08/2017 1213   GLUCOSE 102 (H) 03/08/2017 1213   GLUCOSE 118 (H) 11/28/2016 0922   BUN 16 03/08/2017 1213   CREATININE 1.08 03/08/2017 1213   CALCIUM 9.5 03/08/2017 1213   PROT 6.5 03/08/2017 1213   ALBUMIN 4.3 03/08/2017 1213   AST 17 03/08/2017 1213   ALT 16 03/08/2017 1213   ALKPHOS 86 03/08/2017 1213   BILITOT 0.6 03/08/2017 1213   GFRNONAA 74 03/08/2017 1213   GFRAA 85 03/08/2017 1213   Lab Results  Component Value Date   CHOL 181 03/17/2016   HDL 47 03/17/2016   LDLCALC 118 (H) 03/17/2016   TRIG 80 03/17/2016   CHOLHDL 3.9 03/17/2016   Lab Results  Component Value Date   HGBA1C 5.1 03/17/2016    ASSESSMENT AND PLAN 61  y.o. year old male  has a past medical history of Arthritis; CAD (coronary artery disease); Chronic back pain; GERD (gastroesophageal reflux disease); Hypertension; Ischemic cardiomyopathy; Mixed hyperlipidemia; Myocardial infarction ([place]) (2015); Peripheral vascular disease ([place]); Sleep apnea; Stroke ([place]) (03/2016); and Urinary frequency. here with:  1.  Obstructive sleep apnea on CPAP  The patient's download indicates that he continues to have a high apnea index. I consulted with Dr. Rexene Alberts and we will order the patient a CPAP titration. He did have a split night study and his apnea seemed to be treated on 9 cm water however his download shows a high apnea index. The patient is amenable to this plan. He is encouraged to continue using his CPAP nightly and for greater than 4 hours each night. He will return in 3 months or sooner if needed.  I spent 15 minutes with the patient 50% of this time was spent reviewing the patient's CPAP download.   Ward Givens, MSN, NP-C 03/19/2017, 7:22 AM [facility] [address], [place], [place] [address] [phone]  I reviewed the above note and documentation by the Nurse Practitioner and agree with the history, physical exam, assessment and plan as outlined above. I was immediately available for face-to-face consultation. Star Age, MD, PhD [facility])

## 2017-03-20 ENCOUNTER — Ambulatory Visit (INDEPENDENT_AMBULATORY_CARE_PROVIDER_SITE_OTHER)
Admission: RE | Admit: 2017-03-20 | Discharge: 2017-03-20 | Disposition: A | Discharge status: Home / Self Care | Payer: PPO | Source: Ambulatory Visit | Attending: Cardiovascular Disease | Admitting: Cardiovascular Disease

## 2017-03-20 DIAGNOSIS — R140 Abdominal distension (gaseous): Secondary | ICD-10-CM | POA: Diagnosis not present

## 2017-03-20 DIAGNOSIS — R188 Other ascites: Secondary | ICD-10-CM | POA: Diagnosis not present

## 2017-04-02 ENCOUNTER — Ambulatory Visit (INDEPENDENT_AMBULATORY_CARE_PROVIDER_SITE_OTHER): Payer: PPO | Admitting: *Deleted

## 2017-04-02 DIAGNOSIS — I63212 Cerebral infarction due to unspecified occlusion or stenosis of left vertebral arteries: Secondary | ICD-10-CM

## 2017-04-04 NOTE — Progress Notes (Signed)
Carelink Summary Report 

## 2017-04-05 ENCOUNTER — Ambulatory Visit: Payer: PPO | Admitting: Cardiovascular Disease

## 2017-04-05 LAB — CUP PACEART REMOTE DEVICE CHECK
Date Time Interrogation Session: 20180527204135
Implantable Pulse Generator Implant Date: 20170601

## 2017-04-05 NOTE — Addendum Note (Signed)
Addendum  created 04/05/17 1023 by Rica Koyanagi, MD   Sign clinical note

## 2017-04-06 ENCOUNTER — Other Ambulatory Visit: Payer: Self-pay | Admitting: Cardiovascular Disease

## 2017-04-09 ENCOUNTER — Ambulatory Visit (INDEPENDENT_AMBULATORY_CARE_PROVIDER_SITE_OTHER): Payer: PPO | Admitting: Neurology

## 2017-04-09 DIAGNOSIS — I639 Cerebral infarction, unspecified: Secondary | ICD-10-CM

## 2017-04-09 DIAGNOSIS — G4733 Obstructive sleep apnea (adult) (pediatric): Secondary | ICD-10-CM

## 2017-04-09 DIAGNOSIS — G4731 Primary central sleep apnea: Secondary | ICD-10-CM

## 2017-04-09 DIAGNOSIS — Z9989 Dependence on other enabling machines and devices: Secondary | ICD-10-CM

## 2017-04-12 ENCOUNTER — Telehealth: Payer: Self-pay | Admitting: Neurology

## 2017-04-12 DIAGNOSIS — I639 Cerebral infarction, unspecified: Secondary | ICD-10-CM

## 2017-04-12 DIAGNOSIS — Z9989 Dependence on other enabling machines and devices: Secondary | ICD-10-CM

## 2017-04-12 DIAGNOSIS — G4731 Primary central sleep apnea: Secondary | ICD-10-CM

## 2017-04-12 NOTE — Telephone Encounter (Signed)
Couldn't do result note on recent titration study. He came for a re-titration study on 04/09/17 d/t residual OSA on CPAP of 10 cm.   Please call and inform patient that I have entered an order for treatment with positive airway pressure (PAP) treatment of obstructive sleep apnea (OSA). He did fairly well during the latest sleep study with BiPAP ST. We will, therefore, arrange for a machine for home use through a DME (durable medical equipment) company of His choice; and I will see the patient back in follow-up in about 10 weeks. Please also explain to the patient that I will be looking out for compliance data, which can be downloaded from the machine (stored on an SD card, that is inserted in the machine) or via remote access through a modem, that is built into the machine. At the time of the followup appointment we will discuss sleep study results and how it is going with PAP treatment at home. Please advise patient to bring His machine at the time of the first FU visit, even though this is cumbersome. Bringing the machine for every visit after that will likely not be needed, but often helps for the first visit to troubleshoot if needed. Please re-enforce the importance of compliance with treatment and the need for [place] to monitor compliance data - often an insurance requirement and actually good feedback for the patient as far as how they are doing.  Also remind patient, that any interim PAP machine or mask issues should be first addressed with the DME company, as they can often help better with technical and mask fit issues. Please ask if patient has a preference regarding DME company.  Please also make sure, the patient has a follow-up appointment with me in about 10 weeks from the setup date, thanks.  Once you have spoken to the patient - and faxed/routed report to PCP and referring MD (if other than PCP), you can close this encounter, thanks,   Star Age, MD, PhD [facility] ([place])

## 2017-04-12 NOTE — Procedures (Signed)
PATIENT'S NAME:  Mekel, [place]verstock DOB:      [birth year]      MR#:    [id number]     DATE OF RECORDING: 04/09/2017 REFERRING M.D.:  Juanita Craver, MD Study Performed:   CPAP  Titration HISTORY: 61 year old right-handed gentleman with a history of hyperlipidemia, reflux disease, hypertension, heart disease, status post MI, status post stent placement, status post 2 vessel CABG in February 2015, smoking, arthritis, left cerebellar and left lateral medulla stroke in May 2017, status post loop recorder implantation, who presents for retitration to optimize treatment of his OSA as he has residual respiratory events on CPAP of 10 cm. He had a split-night sleep study on 06/07/2016. Total AHI was 37.6 per hour, average oxygen saturation was 93%, nadir was 86%. He was then titrated with CPAP from 5 cm to 9 cm. AHI was 0.4 per hour at the final pressure with brief supine REM sleep achieved.   CURRENT MEDICATIONS: Lipitor, Cozaar, Microzide  PROCEDURE:  This is a multichannel digital polysomnogram utilizing the SomnoStar 11.2 system.  Electrodes and sensors were applied and monitored per AASM Specifications.   EEG, EOG, Chin and Limb EMG, were sampled at 200 Hz.  ECG, Snore and Nasal Pressure, Thermal Airflow, Respiratory Effort, CPAP Flow and Pressure, Oximetry was sampled at 50 Hz. Digital video and audio were recorded.      The patient used medium nasal pillows. CPAP was initiated at 5 cmH20 with heated humidity per AASM standards and pressure was advanced to 13 cmH20 because of hypopneas, apneas and desaturations. He had an AHI of 28.3/h at that point and was switched to bilevel pressure of 16/12 cm, with AHI of 73.8/h, and a back up rate of 14 was added, resulting in AHI of 0/hour but only 1 min of sleep. He was tried on BiPAP ST of 15/11 with a rate of 14 with AHI of 0/hour, O2 nadir of 92%, and supine NREM sleep achieved. He was further titrated to ASV (EPAP 6, min PS 3, max PS 15) with AHI of 0/h.   Lights Out  was at 22:03 and Lights On at 05:06. Total recording time (TRT) was 423.5 minutes, with a total sleep time (TST) of 380 minutes. The patient's sleep latency was 5.5 minutes with 0.5 minutes of wake time after sleep onset. REM latency was 57.5 minutes.  The sleep efficiency was 89.7 %.    SLEEP ARCHITECTURE: WASO (Wake after sleep onset)  was 37.5 minutes.  There were 15.5 minutes in Stage N1, 203.5 minutes Stage N2, 73.5 minutes Stage N3 and 87.5 minutes in Stage REM.  The percentage of Stage N1 was 4.1%, Stage N2 was 53.6%, Stage N3 was 19.3% and Stage R (REM sleep) was 23.%.  The arousals were noted as: 47 were spontaneous, 11 were associated with PLMs, 225 were associated with respiratory events.  Audio and video analysis did not show any abnormal or unusual movements, behaviors, phonations or vocalizations. The patient took no bathroom breaks. The EKG was in keeping with normal sinus rhythm (NSR) with occasional PVCs noted.   RESPIRATORY ANALYSIS:  There was a total of 224 respiratory events: 116 obstructive apneas, 66 central apneas and 0 mixed apneas with a total of 182 apneas and an apnea index (AI) of 28.7 /hour. There were 42 hypopneas with a hypopnea index of 6.6/hour. The patient also had 0 respiratory event related arousals (RERAs).      The total APNEA/HYPOPNEA INDEX  (AHI) was 35.4 /hour and the total RESPIRATORY  DISTURBANCE INDEX was 35.4 .hour  42 events occurred in REM sleep and 182 events in NREM. The REM AHI was 28.8 /hour versus a non-REM AHI of 37.3 /hour.  The patient spent 95 minutes of total sleep time in the supine position and 285 minutes in non-supine. The supine AHI was 44.2, versus a non-supine AHI of 32.4.  OXYGEN SATURATION & C02:  The baseline 02 saturation was 94%, with the lowest being 88%. Time spent below 89% saturation equaled 2 minutes.  PERIODIC LIMB MOVEMENTS:  The patient had a total of 62 Periodic Limb Movements. The Periodic Limb Movement (PLM) index was 9.8  and the PLM Arousal index was 1.7 /hour.    Post-study, the patient indicated that sleep was the same as usual.   DIAGNOSIS 1. Complex sleep apnea  2. Insufficient treatment with CPAP    3. History of brainstem stroke   PLANS/RECOMMENDATIONS: 1. This was a difficult titration study and an optimal pressure modality and setting may not have been achieved. Nevertheless, I will start the patient on BiPAP ST with a pressure of 15/11 cm and a back up rate of 14 per minute via nasal pillows.  2. This study shows sleep fragmentation and abnormal sleep stage percentages; these are nonspecific findings and per se do not signify an intrinsic sleep disorder or a cause for the patient's sleep-related symptoms. Causes include (but are not limited to) the first night effect of the sleep study, circadian rhythm disturbances, medication effect or an underlying mood disorder or medical problem.  3. The patient should be cautioned not to drive, work at heights, or operate dangerous or heavy equipment when tired or sleepy. Review and reiteration of good sleep hygiene measures should be pursued with any patient. 4. Please note that untreated obstructive sleep apnea carries additional perioperative morbidity. Patients with significant obstructive sleep apnea should receive perioperative PAP therapy and the surgeons and particularly the anesthesiologist should be informed of the diagnosis and the severity of the sleep disordered breathing. 5. The patient will be seen in follow-up by Dr. Rexene Alberts at [facility] for discussion of the test results and further management strategies. The referring provider will be notified of the test results.  I certify that I have reviewed the entire raw data recording prior to the issuance of this report in accordance with the Standards of Accreditation of the American Academy of Sleep Medicine (AASM)  Star Age, MD, PhD Diplomat, American Board of Psychiatry and Neurology (Neurology and Sleep  Medicine)

## 2017-04-15 NOTE — Telephone Encounter (Signed)
I called pt. I advised pt that Dr. Rexene Alberts reviewed their sleep study results and found that pt did fairly well with BiPap. Dr. Rexene Alberts recommends that pt should start a Bipap at home. I reviewed PAP compliance expectations with the pt. Pt is agreeable to starting a BiPAP. I advised pt that an order will be sent to a DME, [facility], and [facility] will call the pt within about one week after they file with the pt's insurance. [facility] will show the pt how to use the machine, fit for masks, and troubleshoot the BiPAP if needed. A follow up appt was made for insurance purposes with Dr.Athar  on Mon Sept 10th at 10:30am. Pt verbalized understanding to arrive 15 minutes early and bring their BiPAP. A letter with all of this information in it will be mailed to the pt as a reminder. I verified with the pt that the address we have on file is correct. Pt verbalized understanding of results. Pt had no questions at this time but was encouraged to call back if questions arise.

## 2017-04-16 DIAGNOSIS — G4733 Obstructive sleep apnea (adult) (pediatric): Secondary | ICD-10-CM | POA: Diagnosis not present

## 2017-04-30 ENCOUNTER — Ambulatory Visit (INDEPENDENT_AMBULATORY_CARE_PROVIDER_SITE_OTHER): Payer: PPO | Admitting: *Deleted

## 2017-04-30 DIAGNOSIS — I63212 Cerebral infarction due to unspecified occlusion or stenosis of left vertebral arteries: Secondary | ICD-10-CM | POA: Diagnosis not present

## 2017-05-01 NOTE — Progress Notes (Signed)
[facility] Summary Report / Loop Recorder 

## 2017-05-07 ENCOUNTER — Telehealth: Payer: Self-pay | Admitting: Cardiovascular Disease

## 2017-05-07 DIAGNOSIS — I10 Essential (primary) hypertension: Secondary | ICD-10-CM | POA: Diagnosis not present

## 2017-05-07 DIAGNOSIS — R600 Localized edema: Secondary | ICD-10-CM | POA: Diagnosis not present

## 2017-05-07 DIAGNOSIS — L989 Disorder of the skin and subcutaneous tissue, unspecified: Secondary | ICD-10-CM | POA: Diagnosis not present

## 2017-05-07 NOTE — Telephone Encounter (Signed)
New message   Pt states his PCP wants to put him on LASIX to make his blood pressure lower and requests a call back

## 2017-05-07 NOTE — Telephone Encounter (Signed)
Spoke with pt, his medical doctor is giving him furosemide for swelling in his feet and ankles, he wanted to know if okay to take. Advised patient okay to take but he would need to stop the HCTZ. Also advised patient to make sure the medical doctor is going to check lab work in one to two weeks after starting to make sure kidney function and potassium are okay.

## 2017-05-09 ENCOUNTER — Other Ambulatory Visit: Payer: Self-pay | Admitting: Cardiovascular Disease

## 2017-05-12 LAB — CUP PACEART REMOTE DEVICE CHECK
Date Time Interrogation Session: 20180626214012
Implantable Pulse Generator Implant Date: 20170601

## 2017-05-12 NOTE — Progress Notes (Signed)
[facility] summary report received. Battery status OK. Normal device function. No new symptom episodes, tachy episodes, brady, or pause episodes. No new AF episodes. Monthly summary reports and ROV/PRN 

## 2017-05-16 DIAGNOSIS — G4733 Obstructive sleep apnea (adult) (pediatric): Secondary | ICD-10-CM | POA: Diagnosis not present

## 2017-05-20 DIAGNOSIS — I10 Essential (primary) hypertension: Secondary | ICD-10-CM | POA: Diagnosis not present

## 2017-05-22 ENCOUNTER — Telehealth: Payer: Self-pay | Admitting: Cardiology

## 2017-05-22 NOTE — Telephone Encounter (Signed)
Spoke w/ pt and requested that he send a manual transmission b/c his home monitor has not updated in at least 14 days.   

## 2017-05-23 DIAGNOSIS — R7301 Impaired fasting glucose: Secondary | ICD-10-CM | POA: Diagnosis not present

## 2017-05-30 ENCOUNTER — Ambulatory Visit (INDEPENDENT_AMBULATORY_CARE_PROVIDER_SITE_OTHER): Payer: PPO | Admitting: *Deleted

## 2017-05-30 DIAGNOSIS — I63212 Cerebral infarction due to unspecified occlusion or stenosis of left vertebral arteries: Secondary | ICD-10-CM

## 2017-05-31 ENCOUNTER — Encounter: Payer: Self-pay | Admitting: Cardiology

## 2017-05-31 NOTE — Progress Notes (Signed)
[facility] Summary Report / Loop Recorder 

## 2017-06-04 DIAGNOSIS — C44311 Basal cell carcinoma of skin of nose: Secondary | ICD-10-CM | POA: Diagnosis not present

## 2017-06-04 DIAGNOSIS — D229 Melanocytic nevi, unspecified: Secondary | ICD-10-CM | POA: Diagnosis not present

## 2017-06-04 DIAGNOSIS — L720 Epidermal cyst: Secondary | ICD-10-CM | POA: Diagnosis not present

## 2017-06-04 DIAGNOSIS — Z4789 Encounter for other orthopedic aftercare: Secondary | ICD-10-CM | POA: Diagnosis not present

## 2017-06-06 ENCOUNTER — Other Ambulatory Visit: Payer: Self-pay | Admitting: Cardiovascular Disease

## 2017-06-12 ENCOUNTER — Encounter: Payer: Self-pay | Admitting: Cardiology

## 2017-06-13 LAB — CUP PACEART REMOTE DEVICE CHECK
Date Time Interrogation Session: 20180726221006
Implantable Pulse Generator Implant Date: 20170601

## 2017-06-17 ENCOUNTER — Other Ambulatory Visit: Payer: Self-pay | Admitting: Cardiovascular Disease

## 2017-06-18 ENCOUNTER — Telehealth: Payer: Self-pay | Admitting: Cardiovascular Disease

## 2017-06-18 NOTE — Telephone Encounter (Signed)
Pt calling requesting a refill on metoprolol tartrate 25 mg tablet and medication is not on pt's med list and I do not see where pt medication was D/C by provider. Please advise

## 2017-06-19 ENCOUNTER — Ambulatory Visit: Payer: PPO | Admitting: Neurology

## 2017-06-19 MED ORDER — METOPROLOL TARTRATE 25 MG PO TABS: 25.0000 mg | ORAL_TABLET | Freq: Every day | ORAL | 11 refills | Status: DC

## 2017-06-19 NOTE — Telephone Encounter (Signed)
Patient's metoprolol was removed from his list at his office visit, it is recorded patient had not taken metoprolol in 30 days. Patient stated he never stopped taking metoprolol and that he is currently taking Metoprolol 25 mg daily.  Patient's BP had been low at 95/66 and 86/54 at last office visit in May, patient stated his BP has come up since then at 126/78. In Dr. Kyla Balzarine office note, he wanted patient to continue his Lopressor. Since last visit patient has started Lasix 40 mg by mouth daily by his PCP. Will forward to Dr. Johnsie Cancel to clarify that patient should be on Metoprolol 25 mg daily.

## 2017-06-19 NOTE — Telephone Encounter (Signed)
Sent refill in for metoprolol 25 mg daily to patient's pharmacy of choice.

## 2017-06-19 NOTE — Telephone Encounter (Signed)
Can take 25 mg daily

## 2017-06-21 ENCOUNTER — Telehealth: Payer: Self-pay | Admitting: Cardiology

## 2017-06-21 NOTE — Telephone Encounter (Signed)
Spoke with pt, pt's home monitor is hooked up to a telephone line, pt does not have cell service. Informed pt that the monitors will no longer transmit through a phone line d/t phone lines switching from analog to digital informed pt that I would order an adapter that will hook up to his Internet. Informed pt that it could take 14 business days. Pt voiced understanding.

## 2017-06-21 NOTE — Telephone Encounter (Signed)
Follow Up:    Please call,pt received a letter stating you had not received his transmission.

## 2017-07-01 ENCOUNTER — Encounter: Payer: PPO | Admitting: *Deleted

## 2017-07-15 ENCOUNTER — Ambulatory Visit: Payer: Self-pay | Admitting: Neurology

## 2017-08-06 DIAGNOSIS — C44311 Basal cell carcinoma of skin of nose: Secondary | ICD-10-CM | POA: Diagnosis not present

## 2017-08-14 DIAGNOSIS — J22 Unspecified acute lower respiratory infection: Secondary | ICD-10-CM | POA: Diagnosis not present

## 2017-08-18 IMAGING — RF DG C-ARM 61-120 MIN
1 series · 1 of 1 positions shown · non-contrast
Comparison: None.

CLINICAL DATA: Kyphoplasty L4 level

EXAM:
LUMBAR SPINE - 2-3 VIEW; DG C-ARM 61-120 MIN

[Series 1: run · 1 of 1 slices shown]
[im 1/1]
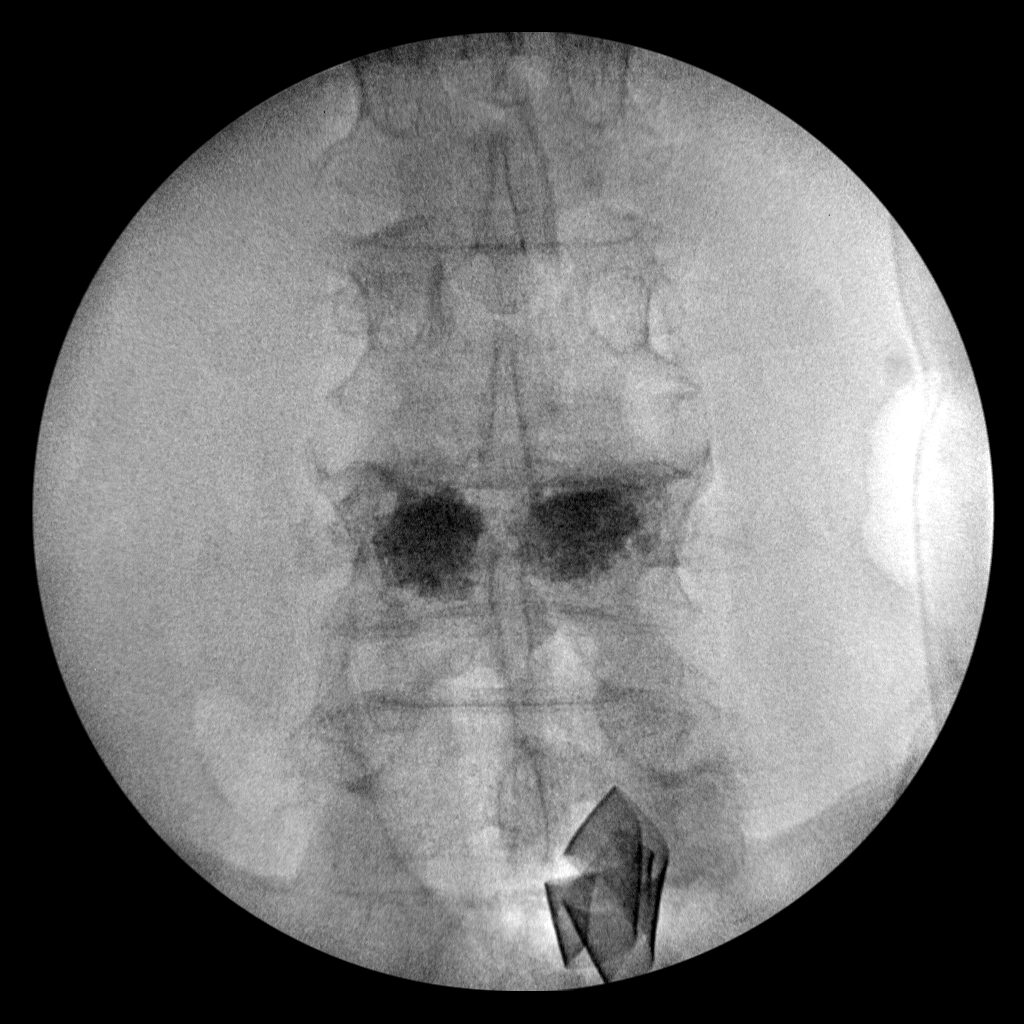

[1 of 1 positions shown; findings below may reference images not displayed]

FINDINGS: Four views of the lumbar spine submitted. Compression deformity is
noted upper endplate of L4 vertebral body. Kyphoplasty is performed
at L4 level. The alignment is preserved.
IMPRESSION: Kyphoplasty at L4 level with alignment preserved.

Fluoroscopy time was 1 minute 5 seconds. Please see the operative
report.

## 2017-09-03 ENCOUNTER — Telehealth: Payer: Self-pay | Admitting: Cardiovascular Disease

## 2017-09-03 NOTE — Telephone Encounter (Signed)
Called patient back. Dr. Johnsie Cancel recommends patient to follow-up with his PCP about his BPs. Patient verbalized understanding and will call his PCP.

## 2017-09-03 NOTE — Telephone Encounter (Signed)
New message    Pt is calling.   Pt c/o BP issue: STAT if pt c/o blurred vision, one-sided weakness or slurred speech  1. What are your last 5 BP readings? 161/101, 156/105  2. Are you having any other symptoms (ex. Dizziness, headache, blurred vision, passed out)? headache  3. What is your BP issue? Pt states his bp is running high

## 2017-09-11 ENCOUNTER — Ambulatory Visit: Payer: PPO | Admitting: Neurology

## 2017-10-10 ENCOUNTER — Other Ambulatory Visit: Payer: Self-pay | Admitting: Cardiovascular Disease

## 2017-12-25 ENCOUNTER — Other Ambulatory Visit: Payer: Self-pay | Admitting: Cardiovascular Disease

## 2017-12-30 DIAGNOSIS — B369 Superficial mycosis, unspecified: Secondary | ICD-10-CM | POA: Diagnosis not present

## 2018-01-27 DIAGNOSIS — Z0000 Encounter for general adult medical examination without abnormal findings: Secondary | ICD-10-CM | POA: Diagnosis not present

## 2018-01-27 DIAGNOSIS — E782 Mixed hyperlipidemia: Secondary | ICD-10-CM | POA: Diagnosis not present

## 2018-01-27 DIAGNOSIS — N400 Enlarged prostate without lower urinary tract symptoms: Secondary | ICD-10-CM | POA: Diagnosis not present

## 2018-01-27 DIAGNOSIS — I10 Essential (primary) hypertension: Secondary | ICD-10-CM | POA: Diagnosis not present

## 2018-01-27 DIAGNOSIS — K219 Gastro-esophageal reflux disease without esophagitis: Secondary | ICD-10-CM | POA: Diagnosis not present

## 2018-01-30 DIAGNOSIS — E782 Mixed hyperlipidemia: Secondary | ICD-10-CM | POA: Diagnosis not present

## 2018-01-30 DIAGNOSIS — I10 Essential (primary) hypertension: Secondary | ICD-10-CM | POA: Diagnosis not present

## 2018-01-30 DIAGNOSIS — Z0000 Encounter for general adult medical examination without abnormal findings: Secondary | ICD-10-CM | POA: Diagnosis not present

## 2018-01-30 DIAGNOSIS — Z125 Encounter for screening for malignant neoplasm of prostate: Secondary | ICD-10-CM | POA: Diagnosis not present

## 2018-02-03 ENCOUNTER — Telehealth: Payer: Self-pay | Admitting: Cardiovascular Disease

## 2018-02-03 MED ORDER — FENOFIBRATE 145 MG PO TABS: 145.0000 mg | ORAL_TABLET | Freq: Every day | ORAL | 3 refills | Status: DC

## 2018-02-03 NOTE — Telephone Encounter (Signed)
Can try tricor 145 mg/day and f/u with lipid clinic

## 2018-02-03 NOTE — Telephone Encounter (Signed)
Patient stated his Triglycerides were 450 on recent lab work at [facility] office. Patient stated everything else looked good. Patient wanted to let Dr. Johnsie Cancel know, and he wants to know if there was anything else he could do besides changing his diet. Requested copy of lab work for patient's chart, patient will have PCP send our office a copy. Will forward to Dr. Johnsie Cancel for advisement.

## 2018-02-03 NOTE — Telephone Encounter (Signed)
Called patient with Dr. Kyla Balzarine recommendations. Patient will start taking Tricor 145 mg daily and will see lipid clinic later this week. Patient verbalized understanding.

## 2018-02-03 NOTE — Telephone Encounter (Signed)
New message    Patient requesting call from nurse to request medication and labs. Patient had labs done at PCP office. He is concerned about triglycerides.

## 2018-02-06 ENCOUNTER — Ambulatory Visit (INDEPENDENT_AMBULATORY_CARE_PROVIDER_SITE_OTHER): Payer: PPO | Admitting: Pharmacist

## 2018-02-06 ENCOUNTER — Encounter: Payer: Self-pay | Admitting: Pharmacist

## 2018-02-06 DIAGNOSIS — E782 Mixed hyperlipidemia: Secondary | ICD-10-CM | POA: Diagnosis not present

## 2018-02-06 MED ORDER — ATORVASTATIN CALCIUM 80 MG PO TABS: 80.0000 mg | ORAL_TABLET | Freq: Every day | ORAL | 3 refills | Status: DC

## 2018-02-06 NOTE — Progress Notes (Signed)
Patient ID: [name]                 DOB: [birth year]                    MRN: [id number]     HPI: [name] is a 62 y.o. male patient of Dr. Johnsie Cancel that presents today for lipid evaluation.  PMH includes CAD s/p bare metal stent to RCA in 2002 and 2008 and bare metal stent LAD in 2002 and 2008, ICM (EF ~ 40%), HTN, HL, OSA, GERD, tobacco abuse, now s/p CABG. He started Tricor yesterday.   He presents today for discussion of triglycerides. He states he knows that he needs to do better with diet and he and his wife are committed to being better. He states that he is tolerating his medications without side effects and has never tried any other medications for cholesterol.   Today he states that he has had some swelling in his legs and would like to go back to taking furosemide. Per our record he has been taking furosemide, but he reports he is currently taking HCTZ 25mg  daily. He states his primary doctor did give him some furosemide, but they will not refill for him as his provider recently left the practice. Thus he restarted the HCTZ.    Risk Factors: CAD with stenting and CABG LDL Goal: <70, TG <150  Current Medications: atorvastatin 40mg  daily, fenofibrate 145mg  daily Intolerances: none  Diet: He does not follow a strict diet. He eats a lot of meats and breads. He eats red meat frequently. He states he gets tired of chicken.   Exercise: He is limited due to back surgery and neck surgery. He has been trying to walk.   Family History: History of heart disease mother and father's side.   Social History: Smokes 1/2 ppd cigarettes. He drinks 1-2 beers per month.   Labs: 01/30/17: TC 182, TG 476, HDL 33, nonHDL 149, LDL-D 88 (atorvastatin 40mg  daily)  Past Medical History:  Diagnosis Date  . Arthritis   . CAD (coronary artery disease)    a. s/p prior MI; hx of stent to RCA and LAD;  b. LHC 12/2006:  LM 20%, pLAD 40%, mLAD stent ok, apical LAD 90% (1.5-49mm vessel), oD1 40-50%,  AV groove CFX 30%, oOM1 30%, pRCA 30% (multiple), mRCA stent ok with 30-40% ISR, dRCA 30%, dAnt, inf-apical severe HK, EF 40% => med Rx.;  12/2013 Cath: LM nl, LAD 95ost, 31m ISR, 99apex, LCX nl, RCA 40p, 100 ISR, L->R collats, EF 35-40%-->pending CABG  . Chronic back pain    lumbar compression fracture and stenosis  . GERD (gastroesophageal reflux disease)    occ  . Hypertension    takes Losartan,HCTZ,  and Metoprolol daily  . Ischemic cardiomyopathy    a. Echo 09/2012:  Mod LVH, focal basal hypertrophy, EF 30-35%, mid to dist inf-lat HK, Gr 1 diast dysfn, mild to mod LAE  . Mixed hyperlipidemia    takes Atorvastatin daily  . Myocardial infarction ([place]) 2015   x2  . Peripheral vascular disease ([place])   . Sleep apnea    not using cpap at present had new test and getting new cpap  . Stroke ([place]) 03/2016  . Urinary frequency    takes Flomax daily    Current Outpatient Medications on File Prior to Visit  Medication Sig Dispense Refill  . aspirin 81 MG EC tablet TAKE 1 TABLET BY MOUTH EVERY DAY 30  tablet 9  . atorvastatin (LIPITOR) 40 MG tablet TAKE 1 TABLET BY MOUTH EVERY DAY AT 6PM 90 tablet 3  . fenofibrate (TRICOR) 145 MG tablet Take 1 tablet (145 mg total) by mouth daily. 90 tablet 3  . furosemide (LASIX) 20 MG tablet Take 40 mg by mouth daily.     [name] losartan (COZAAR) 100 MG tablet TAKE 1 TABLET BY MOUTH EVERY DAY 90 tablet 3  . metoprolol tartrate (LOPRESSOR) 25 MG tablet Take 1 tablet (25 mg total) by mouth daily. 30 tablet 11  . nitroGLYCERIN (NITROSTAT) 0.4 MG SL tablet Place 1 tablet (0.4 mg total) under the tongue every 5 (five) minutes as needed for chest pain. 25 tablet 3   No current facility-administered medications on file prior to visit.     Allergies  Allergen Reactions  . Hydrocodone     Throat itchy, weakness- took this during stroke not sure if caused problem    Assessment/Plan: Hyperlipidemia: LDL not at goal. TG not at goal. Patient just started Tricor  yesterday for reduction of TG. Will increase atorvastatin to 80mg  daily for additional LDL and TG reduction. Discussed diet and lifestyle modifications at length. Provided him with resources on portion size and heart healthy diet. Will repeat lipid and hepatic panel in 3 months. He requests orders to be given for these to be drawn at his PCP office and faxed once resulted and this has been done. Would consider addition of Vascepa at that time based on REDUCE-IT trial and could consider discontinuation of fenofibrate.   Thank you,  Lelan Pons. Patterson Hammersmith, [facility]  02/06/2018 8:12 AM

## 2018-02-06 NOTE — Patient Instructions (Addendum)
INCREASE atorvastatin to 80mg  daily (you may take 2 tablets of your current supply then pick up higher strength and take 1 tablet daily)  We will repeat your cholesterol panel in 2-3 months. Please have the results faxed to [phone] - Dr. Kyla Balzarine office.    Cholesterol Cholesterol is a fat. Your body needs a small amount of cholesterol. Cholesterol (plaque) may build up in your blood vessels (arteries). That makes you more likely to have a heart attack or stroke. You cannot feel your cholesterol level. Having a blood test is the only way to find out if your level is high. Keep your test results. Work with your doctor to keep your cholesterol at a good level. What do the results mean?  Total cholesterol is how much cholesterol is in your blood.  LDL is bad cholesterol. This is the type that can build up. Try to have low LDL.  HDL is good cholesterol. It cleans your blood vessels and carries LDL away. Try to have high HDL.  Triglycerides are fat that the body can store or burn for energy. What are good levels of cholesterol?  Total cholesterol below 200.  LDL below 100 is good for people who have health risks. LDL below 70 is good for people who have very high risks.  HDL above 40 is good. It is best to have HDL of 60 or higher.  Triglycerides below 150. How can I lower my cholesterol? Diet Follow your diet program as told by your doctor.  Choose fish, white meat chicken, or [place] that is roasted or baked. Try not to eat red meat, fried foods, sausage, or lunch meats.  Eat lots of fresh fruits and vegetables.  Choose whole grains, beans, pasta, potatoes, and cereals.  Choose olive oil, corn oil, or canola oil. Only use small amounts.  Try not to eat butter, mayonnaise, shortening, or palm kernel oils.  Try not to eat foods with trans fats.  Choose low-fat or nonfat dairy foods. ? Drink skim or nonfat milk. ? Eat low-fat or nonfat yogurt and cheeses. ? Try not to drink  whole milk or cream. ? Try not to eat ice cream, egg yolks, or full-fat cheeses.  Healthy desserts include angel food cake, ginger snaps, animal crackers, hard candy, popsicles, and low-fat or nonfat frozen yogurt. Try not to eat pastries, cakes, pies, and cookies.  Exercise Follow your exercise program as told by your doctor.  Be more active. Try gardening, walking, and taking the stairs.  Ask your doctor about ways that you can be more active.  Medicine  Take over-the-counter and prescription medicines only as told by your doctor. This information is not intended to replace advice given to you by your health care provider. Make sure you discuss any questions you have with your health care provider. Document Released: 01/18/2009 Document Revised: 05/23/2016 Document Reviewed: 05/03/2016 Elsevier Interactive Patient Education  [employer].

## 2018-02-17 NOTE — Progress Notes (Signed)
Patient ID: [name], male   DOB: 01-29-1956, 62 y.o.   MRN: [id number]  62 y.o. f/u for lipids and CAD. Previous BMS to RCA and LAD in 2002/2008. Failed and subsequent CABG  CABG 01/01/14  with Dr Cyndia Bent This included LIMA to LAD and SVG to PDA EF 30-35% by echo   Has not smoked since surgery EF 35-40% by last TEE done 03/15/16 Seen by [facility] and CRT/AICD not thought To be needed. May of 2017 had CVA left PICA cerebellum occluded left vertebral.     Had ILR placed by Dr Lovena Le June 2017 No PAF detected  Has had edema on norvasc and back on lasix. Seen in lipid clinic earlier this month and Lipitor increased to 80 mg on fibrates for triglycerides Repeat labs due in June. Discussed dietary indiscretions  They also discussed Reduce-IT trial and Vascepa   No cardiac complaints [place] to [place] this [place]   ROS: Denies fever, malais, weight loss, blurry vision, decreased visual acuity, cough, sputum, SOB, hemoptysis, pleuritic pain, palpitaitons, heartburn, abdominal pain, melena, lower extremity edema, claudication, or rash.  All other systems reviewed and negative  General: BP 98/68   Pulse 63   Ht 5\' 7"  (1.702 m)   Wt 191 lb 8 oz (86.9 kg)   SpO2 98%   BMI 29.99 kg/m   Affect appropriate Healthy:  appears stated age 43: normal Neck supple with no adenopathy JVP normal no bruits no thyromegaly Lungs clear with no wheezing and good diaphragmatic motion Heart:  S1/S2 no murmur, no rub, gallop or click PMI normal Abdomen: benighn, BS positve, no tenderness, no AAA no bruit.  No HSM or HJR Distal pulses intact with no bruits Mild bilateral edema Neuro non-focal poor balance  Skin warm and dry Poor balance walking with cane     Current Outpatient Medications  Medication Sig Dispense Refill  . aspirin 81 MG EC tablet TAKE 1 TABLET BY MOUTH EVERY DAY 30 tablet 9  . atorvastatin (LIPITOR) 80 MG tablet Take 1 tablet (80 mg total) by mouth daily. 90 tablet 3  .  fenofibrate (TRICOR) 145 MG tablet Take 1 tablet (145 mg total) by mouth daily. 90 tablet 3  . furosemide (LASIX) 20 MG tablet Take 40 mg by mouth daily.     [name] losartan (COZAAR) 100 MG tablet TAKE 1 TABLET BY MOUTH EVERY DAY 90 tablet 3  . metoprolol tartrate (LOPRESSOR) 25 MG tablet Take 1 tablet (25 mg total) by mouth daily. 30 tablet 11  . tamsulosin (FLOMAX) 0.4 MG CAPS capsule Take 0.4 mg by mouth daily.    . nitroGLYCERIN (NITROSTAT) 0.4 MG SL tablet Place 1 tablet (0.4 mg total) under the tongue every 5 (five) minutes as needed for chest pain. 25 tablet 3   No current facility-administered medications for this visit.     Allergies  Hydrocodone  Electrocardiogram:  06/24/14  SR rate 62 anterolateral MI with persistent lateral T wave inversions  QRS normal    07/07/15 SR ate 73  Anterolateral T wave changes persist 03/08/17  SR rate 79 LVH repol abnormality  Assessment and Plan  Cerebellar Stroke:   Plavix d/c by neuro thought to be dissection of vertebral. Continue cholesterol meds , ASA and non smoking  CAD: Stable with no angina and good activity level.  Continue lopressor and ASA  DCM  euvolemic functional class one continue current meds   Chol: labs in June and f/u lipid clinic Lipitor increased to 80 mg  April 2019  Lab Results  Component Value Date   LDLCALC 118 (H) 03/17/2016   lasbs with Dr Tollie Pizza HTN: Well controlled.  Continue current medications and low sodium Dash type diet.   Back Pain:    F/u Dr Rolena Infante  Post laminectomy with improvement  Edema:  Persisted despite stopping Norvasc on lasix now improved    Jenkins Rouge

## 2018-02-19 ENCOUNTER — Ambulatory Visit: Payer: PPO | Admitting: Cardiovascular Disease

## 2018-02-20 ENCOUNTER — Ambulatory Visit (INDEPENDENT_AMBULATORY_CARE_PROVIDER_SITE_OTHER): Payer: PPO | Admitting: Cardiovascular Disease

## 2018-02-20 ENCOUNTER — Encounter: Payer: Self-pay | Admitting: Cardiovascular Disease

## 2018-02-20 VITALS — BP 98/68 | HR 63 | Ht 67.0 in | Wt 191.5 lb

## 2018-02-20 DIAGNOSIS — I420 Dilated cardiomyopathy: Secondary | ICD-10-CM | POA: Diagnosis not present

## 2018-02-20 DIAGNOSIS — I2510 Atherosclerotic heart disease of native coronary artery without angina pectoris: Secondary | ICD-10-CM

## 2018-02-20 DIAGNOSIS — I10 Essential (primary) hypertension: Secondary | ICD-10-CM

## 2018-02-20 DIAGNOSIS — E782 Mixed hyperlipidemia: Secondary | ICD-10-CM | POA: Diagnosis not present

## 2018-02-20 NOTE — Patient Instructions (Signed)

## 2018-03-31 ENCOUNTER — Other Ambulatory Visit: Payer: Self-pay | Admitting: Cardiovascular Disease

## 2018-04-01 ENCOUNTER — Other Ambulatory Visit: Payer: Self-pay | Admitting: Physician Assistant

## 2018-04-01 ENCOUNTER — Ambulatory Visit
Admission: RE | Admit: 2018-04-01 | Discharge: 2018-04-01 | Disposition: A | Discharge status: Home / Self Care | Payer: PPO | Source: Ambulatory Visit | Attending: Physician Assistant | Admitting: Physician Assistant

## 2018-04-01 DIAGNOSIS — M25531 Pain in right wrist: Secondary | ICD-10-CM

## 2018-04-01 DIAGNOSIS — I959 Hypotension, unspecified: Secondary | ICD-10-CM | POA: Diagnosis not present

## 2018-04-01 DIAGNOSIS — T148XXA Other injury of unspecified body region, initial encounter: Secondary | ICD-10-CM | POA: Diagnosis not present

## 2018-04-01 DIAGNOSIS — S6991XA Unspecified injury of right wrist, hand and finger(s), initial encounter: Secondary | ICD-10-CM

## 2018-04-01 DIAGNOSIS — N529 Male erectile dysfunction, unspecified: Secondary | ICD-10-CM | POA: Diagnosis not present

## 2018-04-01 DIAGNOSIS — S52501A Unspecified fracture of the lower end of right radius, initial encounter for closed fracture: Secondary | ICD-10-CM | POA: Diagnosis not present

## 2018-04-02 DIAGNOSIS — S52551A Other extraarticular fracture of lower end of right radius, initial encounter for closed fracture: Secondary | ICD-10-CM | POA: Diagnosis not present

## 2018-04-02 MED ORDER — ATORVASTATIN CALCIUM 80 MG PO TABS: 80.0000 mg | ORAL_TABLET | Freq: Every day | ORAL | 3 refills | Status: DC

## 2018-04-08 DIAGNOSIS — S52551A Other extraarticular fracture of lower end of right radius, initial encounter for closed fracture: Secondary | ICD-10-CM | POA: Diagnosis not present

## 2018-04-22 ENCOUNTER — Other Ambulatory Visit: Payer: Self-pay | Admitting: Physician Assistant

## 2018-04-22 DIAGNOSIS — R600 Localized edema: Secondary | ICD-10-CM | POA: Diagnosis not present

## 2018-04-22 DIAGNOSIS — S80822D Blister (nonthermal), left lower leg, subsequent encounter: Secondary | ICD-10-CM | POA: Diagnosis not present

## 2018-04-30 ENCOUNTER — Ambulatory Visit
Admission: RE | Admit: 2018-04-30 | Discharge: 2018-04-30 | Disposition: A | Discharge status: Home / Self Care | Payer: PPO | Source: Ambulatory Visit | Attending: Physician Assistant | Admitting: Physician Assistant

## 2018-04-30 DIAGNOSIS — R600 Localized edema: Secondary | ICD-10-CM | POA: Diagnosis not present

## 2018-05-12 ENCOUNTER — Other Ambulatory Visit: Payer: Self-pay | Admitting: Cardiovascular Disease

## 2018-05-20 ENCOUNTER — Other Ambulatory Visit: Payer: Self-pay | Admitting: Cardiovascular Disease

## 2018-05-20 MED ORDER — ASPIRIN 81 MG PO TBEC: 81.0000 mg | DELAYED_RELEASE_TABLET | Freq: Every day | ORAL | 9 refills | Status: DC

## 2018-07-28 DIAGNOSIS — N400 Enlarged prostate without lower urinary tract symptoms: Secondary | ICD-10-CM | POA: Diagnosis not present

## 2018-07-28 DIAGNOSIS — I10 Essential (primary) hypertension: Secondary | ICD-10-CM | POA: Diagnosis not present

## 2018-07-28 DIAGNOSIS — Z0000 Encounter for general adult medical examination without abnormal findings: Secondary | ICD-10-CM | POA: Diagnosis not present

## 2018-07-28 DIAGNOSIS — K219 Gastro-esophageal reflux disease without esophagitis: Secondary | ICD-10-CM | POA: Diagnosis not present

## 2018-07-28 DIAGNOSIS — E782 Mixed hyperlipidemia: Secondary | ICD-10-CM | POA: Diagnosis not present

## 2018-07-28 DIAGNOSIS — B079 Viral wart, unspecified: Secondary | ICD-10-CM | POA: Diagnosis not present

## 2018-09-08 ENCOUNTER — Other Ambulatory Visit: Payer: Self-pay | Admitting: Cardiovascular Disease

## 2019-01-07 NOTE — Progress Notes (Signed)
Patient ID: [name], male   DOB: February 24, 1956, 63 y.o.   MRN: [id number]    63 y.o. f/u for lipids and CAD. Previous BMS to RCA and LAD in 2002/2008. Failed and subsequent CABG  CABG 01/01/14  with Dr Cyndia Bent This included LIMA to LAD and SVG to PDA EF 30-35% by echo  Has not smoked since surgery EF 35-40% by last TEE done 03/15/16 Seen by [facility] and CRT/AICD not thought To be needed. May of 2017 had CVA left PICA cerebellum occluded left vertebral.     Had ILR placed by Dr Lovena Le June 2017 No PAF detected  Has had edema on norvasc and back on lasix. Seen in lipid clinic   and Lipitor increased to 80 mg on fibrates for triglycerides Repeat labs due in June. Discussed dietary indiscretions  They also discussed Reduce-IT trial and Vascepa   No cardiac complaints Hurt back a bit and still with balance issues from stroke   ROS: Denies fever, malais, weight loss, blurry vision, decreased visual acuity, cough, sputum, SOB, hemoptysis, pleuritic pain, palpitaitons, heartburn, abdominal pain, melena, lower extremity edema, claudication, or rash.  All other systems reviewed and negative  General: BP 120/82   Pulse 61   Ht 5\' 7"  (1.702 m)   Wt 92.1 kg   SpO2 95%   BMI 31.79 kg/m   Affect appropriate Healthy:  appears stated age 63: normal Neck supple with no adenopathy JVP normal no bruits no thyromegaly Lungs clear with no wheezing and good diaphragmatic motion Heart:  S1/S2 no murmur, no rub, gallop or click PMI normal Abdomen: benighn, BS positve, no tenderness, no AAA no bruit.  No HSM or HJR Distal pulses intact with no bruits Mild bilateral edema Neuro non-focal poor balance  Skin warm and dry Poor balance walking with cane     Current Outpatient Medications  Medication Sig Dispense Refill  . aspirin 81 MG EC tablet Take 1 tablet (81 mg total) by mouth daily. Swallow whole. 30 tablet 9  . atorvastatin (LIPITOR) 40 MG tablet Take 1 tablet by mouth daily.    .  hydrochlorothiazide (HYDRODIURIL) 12.5 MG tablet Take 1 tablet by mouth daily.    [name] losartan (COZAAR) 50 MG tablet Take 50 mg by mouth daily.    . metoprolol tartrate (LOPRESSOR) 25 MG tablet TAKE 1 TABLET BY MOUTH EVERY DAY 90 tablet 1  . nitroGLYCERIN (NITROSTAT) 0.4 MG SL tablet Place 1 tablet (0.4 mg total) under the tongue every 5 (five) minutes as needed for chest pain. 25 tablet 3  . tamsulosin (FLOMAX) 0.4 MG CAPS capsule Take 0.4 mg by mouth daily.     No current facility-administered medications for this visit.     Allergies  Hydrocodone  Electrocardiogram:  01/08/19 SR rate 61 anterolateral T wave changes   Assessment and Plan  Cerebellar Stroke:   Plavix d/c by neuro thought to be dissection of vertebral. Continue cholesterol meds , ASA and non smoking  CAD: Stable with no angina and good activity level.  Continue lopressor and ASA  DCM  euvolemic functional class one continue current meds  Update TTE to  Make sure EF has not dropped  Chol: labs in June and f/u lipid clinic Lipitor increased to 80 mg April 2019  Lab Results  Component Value Date   LDLCALC 118 (H) 03/17/2016   lasbs with Dr Tollie Pizza HTN: Well controlled.  Continue current medications and low sodium Dash type diet.   Back Pain:  F/u Dr Rolena Infante  Post laminectomy with improvement  Edema:  Persisted despite stopping Norvasc on lasix now improved    Jenkins Rouge

## 2019-01-08 ENCOUNTER — Encounter: Payer: Self-pay | Admitting: Cardiovascular Disease

## 2019-01-08 ENCOUNTER — Ambulatory Visit: Payer: PPO | Admitting: Cardiovascular Disease

## 2019-01-08 VITALS — BP 120/82 | HR 61 | Ht 67.0 in | Wt 203.0 lb

## 2019-01-08 DIAGNOSIS — I420 Dilated cardiomyopathy: Secondary | ICD-10-CM

## 2019-01-08 DIAGNOSIS — I10 Essential (primary) hypertension: Secondary | ICD-10-CM | POA: Diagnosis not present

## 2019-01-08 DIAGNOSIS — E782 Mixed hyperlipidemia: Secondary | ICD-10-CM

## 2019-01-08 NOTE — Patient Instructions (Signed)
Medication Instructions:   If you need a refill on your cardiac medications before your next appointment, please call your pharmacy.   Lab work:  If you have labs (blood work) drawn today and your tests are completely normal, you will receive your results only by: . MyChart Message (if you have MyChart) OR . A paper copy in the mail If you have any lab test that is abnormal or we need to change your treatment, we will call you to review the results.  Testing/Procedures: Your physician has requested that you have an echocardiogram. Echocardiography is a painless test that uses sound waves to create images of your heart. It provides your doctor with information about the size and shape of your heart and how well your heart's chambers and valves are working. This procedure takes approximately one hour. There are no restrictions for this procedure.  Follow-Up: At [facility], you and your health needs are our priority.  As part of our continuing mission to provide you with exceptional heart care, we have created designated Provider Care Teams.  These Care Teams include your primary Cardiologist (physician) and Advanced Practice Providers (APPs -  Physician Assistants and Nurse Practitioners) who all work together to provide you with the care you need, when you need it. You will need a follow up appointment in 1 years.  Please call our office 2 months in advance to schedule this appointment.  You may see Dr. Nishan or one of the following Advanced Practice Providers on your designated Care Team:   Lori Gerhardt, NP Laura Ingold, NP . Jill McDaniel, NP    

## 2019-01-09 ENCOUNTER — Other Ambulatory Visit: Payer: Self-pay | Admitting: Physician Assistant

## 2019-01-09 DIAGNOSIS — K921 Melena: Secondary | ICD-10-CM | POA: Diagnosis not present

## 2019-01-09 DIAGNOSIS — I10 Essential (primary) hypertension: Secondary | ICD-10-CM | POA: Diagnosis not present

## 2019-01-09 DIAGNOSIS — R140 Abdominal distension (gaseous): Secondary | ICD-10-CM | POA: Diagnosis not present

## 2019-01-13 ENCOUNTER — Ambulatory Visit
Admission: RE | Admit: 2019-01-13 | Discharge: 2019-01-13 | Disposition: A | Discharge status: Home / Self Care | Payer: PPO | Source: Ambulatory Visit | Attending: Physician Assistant | Admitting: Physician Assistant

## 2019-01-13 DIAGNOSIS — K8020 Calculus of gallbladder without cholecystitis without obstruction: Secondary | ICD-10-CM | POA: Diagnosis not present

## 2019-01-13 DIAGNOSIS — K760 Fatty (change of) liver, not elsewhere classified: Secondary | ICD-10-CM | POA: Diagnosis not present

## 2019-01-13 DIAGNOSIS — R140 Abdominal distension (gaseous): Secondary | ICD-10-CM

## 2019-01-14 DIAGNOSIS — K625 Hemorrhage of anus and rectum: Secondary | ICD-10-CM | POA: Diagnosis not present

## 2019-01-14 DIAGNOSIS — K5900 Constipation, unspecified: Secondary | ICD-10-CM | POA: Diagnosis not present

## 2019-01-14 DIAGNOSIS — R140 Abdominal distension (gaseous): Secondary | ICD-10-CM | POA: Diagnosis not present

## 2019-01-15 ENCOUNTER — Telehealth: Payer: Self-pay | Admitting: *Deleted

## 2019-01-15 NOTE — Telephone Encounter (Signed)
Dr. Johnsie Cancel can pt hold asa for colonoscopy?   Pt's echo is pending as well.

## 2019-01-15 NOTE — Telephone Encounter (Signed)
   [facility] Pre-operative Risk Assessment    Request for surgical clearance:  1. What type of surgery is being performed? COLONOSCOPY   2. When is this surgery scheduled? 01/22/19   3. What type of clearance is required (medical clearance vs. Pharmacy clearance to hold med vs. Both)? MEDICAL  4. Are there any medications that need to be held prior to surgery and how long?ASA ?   5. Practice name and name of physician performing surgery? [facility]; DR. HUNG   6. What is your office phone number [phone]    7.   What is your office fax number [phone]  8.   Anesthesia type (None, local, MAC, general) ? PROPOFOL    Julaine Hua 01/15/2019, 10:06 AM  _________________________________________________________________   (provider comments below)

## 2019-01-16 ENCOUNTER — Other Ambulatory Visit: Payer: Self-pay | Admitting: Gastroenterology

## 2019-01-16 ENCOUNTER — Other Ambulatory Visit (HOSPITAL_COMMUNITY): Payer: PPO

## 2019-01-16 DIAGNOSIS — K625 Hemorrhage of anus and rectum: Secondary | ICD-10-CM

## 2019-01-18 NOTE — Telephone Encounter (Signed)
Yes can hold asa for colonoscipy 7 days before

## 2019-01-19 NOTE — Telephone Encounter (Signed)
   Primary Cardiologist: Jenkins Rouge, MD  Chart reviewed as part of pre-operative protocol coverage. Patient was contacted 01/19/2019 in reference to pre-operative risk assessment for pending surgery as outlined below.  [name] was last seen on 01/08/2019 by Dr. Johnsie Cancel.  Since that day, [name] has done well without chest pain or shortness of breath.  Therefore, based on ACC/AHA guidelines, the patient would be at acceptable risk for the planned procedure without further cardiovascular testing.   I will route this recommendation to the requesting party via Epic fax function and remove from pre-op pool.  Please call with questions. I instructed the patient to starting holding aspirin today. I also spoke to [facility] in Dr. Ulyses Amor office to make sure Dr. Benson Norway is ok with holding the aspirin for only 3 days instead of normal 7 days since procedure is scheduled on 3/19. Restart the aspirin after the procedure as soon as possible.   [place], [place] 01/19/2019, 11:18 AM

## 2019-01-23 ENCOUNTER — Ambulatory Visit
Admission: RE | Admit: 2019-01-23 | Discharge: 2019-01-23 | Disposition: A | Discharge status: Home / Self Care | Payer: PPO | Source: Ambulatory Visit | Attending: Gastroenterology | Admitting: Gastroenterology

## 2019-01-23 ENCOUNTER — Other Ambulatory Visit: Payer: Self-pay

## 2019-01-23 DIAGNOSIS — K625 Hemorrhage of anus and rectum: Secondary | ICD-10-CM | POA: Diagnosis not present

## 2019-01-23 MED ORDER — IOPAMIDOL (ISOVUE-300) INJECTION 61%
100.0000 mL | Freq: Once | INTRAVENOUS | Status: AC | PRN
  Administered 2019-01-23: 100 mL via INTRAVENOUS

## 2019-01-25 ENCOUNTER — Other Ambulatory Visit: Payer: Self-pay | Admitting: Cardiovascular Disease

## 2019-01-26 ENCOUNTER — Other Ambulatory Visit: Payer: Self-pay | Admitting: Gastroenterology

## 2019-02-06 ENCOUNTER — Ambulatory Visit (HOSPITAL_COMMUNITY): Admission: RE | Admit: 2019-02-06 | Payer: PPO | Source: Home / Self Care | Admitting: Gastroenterology

## 2019-02-06 ENCOUNTER — Encounter (HOSPITAL_COMMUNITY): Admission: RE | Payer: Self-pay | Source: Home / Self Care

## 2019-02-06 SURGERY — COLONOSCOPY WITH PROPOFOL
Anesthesia: Monitor Anesthesia Care

## 2019-03-07 ENCOUNTER — Other Ambulatory Visit: Payer: Self-pay | Admitting: Cardiovascular Disease

## 2019-04-06 ENCOUNTER — Other Ambulatory Visit: Payer: Self-pay | Admitting: Cardiovascular Disease

## 2019-04-08 ENCOUNTER — Telehealth: Payer: Self-pay | Admitting: Cardiovascular Disease

## 2019-04-08 MED ORDER — ATORVASTATIN CALCIUM 80 MG PO TABS: 80.0000 mg | ORAL_TABLET | Freq: Every day | ORAL | 3 refills | Status: DC

## 2019-04-08 NOTE — Telephone Encounter (Signed)
Pt called about his Atorvastatin and that Rx was not refilled. Pt was just seen with Dr. Johnsie Cancel 01/2019. I called the pt to find out why Rx was not refilled. Pt said he did not know why. I confirmed dose of Atorvastatin 40 mg daily per pt's med list. Pt and pt answered no he is taking Atorvastatin 80 mg daily, that he has the 80 mg tablet. I stated to the pt that I will need to send note to [facility] I. RN for Dr. Johnsie Cancel and confirm dose pt should be taking and that we will call back with dosage and send in refill. Pt wants to let Dr. Johnsie Cancel know as well that his wife has been taking his BP and they are all good. Pt said when [name] calls back about the RX they will give her the BP readings at that time. I will send my note to [facility] I. RN for further follow up.

## 2019-04-08 NOTE — Telephone Encounter (Signed)
Per Dr. Kyla Balzarine last office visit, patient lipitor increased to 80 mg April 2019. Will send in Lipitor 80 mg daily for patient. Patient verbalized understanding.

## 2019-04-08 NOTE — Telephone Encounter (Signed)
New Message    Pt c/o medication issue:  1. Name of Medication: atorvastatin   2. How are you currently taking this medication (dosage and times per day)? 40 mg 1 x daily   3. Are you having a reaction (difficulty breathing--STAT)? No   4. What is your medication issue? Pt is wondering if he is to continue taking this medication because they would not refill the prescription   Please call

## 2019-04-08 NOTE — Telephone Encounter (Signed)
New Message    Pt is calling to reschedule Echo     Please call back

## 2019-04-20 ENCOUNTER — Telehealth (HOSPITAL_COMMUNITY): Payer: Self-pay | Admitting: *Deleted

## 2019-04-20 NOTE — Telephone Encounter (Signed)
COVID-19 Pre-Screening Questions:  . Do you currently have a fever? NO (yes = cancel and refer to pcp for e-visit) . Have you recently travelled on a cruise, internationally, or to [place], [place], [place], [place], [place], or [place], [place] ([place]) ? NO (yes = cancel, stay home, monitor symptoms, and contact pcp or initiate e-visit if symptoms develop) . Have you been in contact with someone that is currently pending confirmation of Covid19 testing or has been confirmed to have the Covid19 virus?  NO (yes = cancel, stay home, away from tested individual, monitor symptoms, and contact pcp or initiate e-visit if symptoms develop) . Are you currently experiencing fatigue or cough? NO (yes = pt should be prepared to have a mask placed at the time of their visit).   . Reiterated no additional visitors. . Arrive no earlier than 15 minutes before appointment time. . Please bring own mask.  [name] 

## 2019-04-21 ENCOUNTER — Ambulatory Visit (HOSPITAL_COMMUNITY): Payer: PPO | Attending: Internal Medicine

## 2019-04-21 ENCOUNTER — Other Ambulatory Visit: Payer: Self-pay

## 2019-04-21 DIAGNOSIS — I420 Dilated cardiomyopathy: Secondary | ICD-10-CM | POA: Diagnosis not present

## 2019-04-21 MED ORDER — PERFLUTREN LIPID MICROSPHERE
1.0000 mL | INTRAVENOUS | Status: AC | PRN
  Administered 2019-04-21: 2 mL via INTRAVENOUS

## 2019-06-05 DIAGNOSIS — E782 Mixed hyperlipidemia: Secondary | ICD-10-CM | POA: Diagnosis not present

## 2019-06-05 DIAGNOSIS — N400 Enlarged prostate without lower urinary tract symptoms: Secondary | ICD-10-CM | POA: Diagnosis not present

## 2019-06-05 DIAGNOSIS — K219 Gastro-esophageal reflux disease without esophagitis: Secondary | ICD-10-CM | POA: Diagnosis not present

## 2019-06-05 DIAGNOSIS — F17200 Nicotine dependence, unspecified, uncomplicated: Secondary | ICD-10-CM | POA: Diagnosis not present

## 2019-06-05 DIAGNOSIS — I10 Essential (primary) hypertension: Secondary | ICD-10-CM | POA: Diagnosis not present

## 2019-06-09 DIAGNOSIS — I10 Essential (primary) hypertension: Secondary | ICD-10-CM | POA: Diagnosis not present

## 2019-06-09 DIAGNOSIS — E782 Mixed hyperlipidemia: Secondary | ICD-10-CM | POA: Diagnosis not present

## 2019-08-18 ENCOUNTER — Telehealth: Payer: Self-pay

## 2019-08-18 NOTE — Telephone Encounter (Signed)
I called pt to discuss what DMV forms he needs filled out on his appt on 08/20/19. If the forms are for his sleep Dr. Rexene Alberts can see him in follow up. If his DMV forms are for something else he may need to follow up with his primary neurologist, Dr. Jaynee Eagles.

## 2019-08-19 NOTE — Telephone Encounter (Signed)
Received this notice from [employer]: "Well, in looking at his account it looks like a former CSR worked on the order for the [place]. She reached out to the pt regarding a balance in collections that needed to be paid before he could be set up. Apparently the pt did not return those calls and then nothing further was done w/ this pt."  We can discuss this further with the pt at his appt next week with Jinny Blossom, NP.

## 2019-08-19 NOTE — Telephone Encounter (Signed)
I called pt. He reports that he has DMV forms that his PCP already filled out but the [facility] is requesting additional information from his neurologist about his driving. He reports that the [facility] forms are not related to his sleep apnea but rather from "when I had a blood clot burst from my neck and Dr. Jaynee Eagles diagnosed me." He is requesting an appt with Dr. Jaynee Eagles.   I checked Dr. Jaynee Eagles and Amy, NP's schedule but the only appt that is available that worked with pt's schedule is on 08/25/19 at 11:30am with Jinny Blossom, NP. Pt is agreeable to this. He asked that his appt with Dr. Rexene Alberts be cancelled. Pt reports that he does use his cpap and he will also bring that to the appt. Pt verbalized understanding of appt date and time.  It appears that pt was supposed to be on bipap treatment after his latest sleep study in 04/2017. I will reach out to [employer] to clarify what happened with this.

## 2019-08-20 ENCOUNTER — Ambulatory Visit: Payer: PPO | Admitting: Neurology

## 2019-08-25 ENCOUNTER — Other Ambulatory Visit: Payer: Self-pay

## 2019-08-25 ENCOUNTER — Ambulatory Visit: Payer: PPO | Admitting: Adult Health

## 2019-08-25 ENCOUNTER — Encounter: Payer: Self-pay | Admitting: Adult Health

## 2019-08-25 VITALS — BP 116/78 | HR 70 | Temp 97.9°F | Ht 67.0 in | Wt 199.8 lb

## 2019-08-25 DIAGNOSIS — Z9989 Dependence on other enabling machines and devices: Secondary | ICD-10-CM

## 2019-08-25 DIAGNOSIS — G4733 Obstructive sleep apnea (adult) (pediatric): Secondary | ICD-10-CM | POA: Diagnosis not present

## 2019-08-25 DIAGNOSIS — Z23 Encounter for immunization: Secondary | ICD-10-CM | POA: Diagnosis not present

## 2019-08-25 DIAGNOSIS — Z8673 Personal history of transient ischemic attack (TIA), and cerebral infarction without residual deficits: Secondary | ICD-10-CM

## 2019-08-25 NOTE — Patient Instructions (Signed)
Your Plan:  Order sent to start Bipap If your symptoms worsen or you develop new symptoms please let us know.   Thank you for coming to see [place] at [facility]. I hope we have been able to provide you high quality care today.  You may receive a patient satisfaction survey over the next few weeks. We would appreciate your feedback and comments so that we may continue to improve ourselves and the health of our patients.

## 2019-08-25 NOTE — Progress Notes (Addendum)
PATIENT: [name] DOB: 1955-11-29  REASON FOR VISIT: follow up HISTORY FROM: patient  HISTORY OF PRESENT ILLNESS: Today 08/25/19:  [name] is a 63 year old male with a history of obstructive sleep apnea.  He returns today for follow-up.  He has not been seen since 2018.  At that time a CPAP titration was done and transition to BiPAP was recommended.  However the patient is still using CPAP.  His download indicates that he uses machine nightly for compliance of 100%.  He uses machine greater than 4 hours 24 days for compliance of 80%.  On average he uses his machine 5 hours and 11 minutes.  His residual AHI is 18.9 on 10 cmH2O with an EPR of 3.  He does not have a significant leak.  The patient has a remote history of stroke.  He has been operate a Teacher, music.  He did get a letter from the [facility] asking for clearance due to his history of stroke.  The patient currently does not have any limitations with driving.  He does use a cane when ambulating.  He states that his gait is cautious as he does not want to fall.   HISTORY 03/19/2017 [name] is a 63 year old male with a history of stroke and obstructive sleep apnea. He returns today for another CPAP download. His download indicates that he used his machine 22 out of 30 days for compliance of 73%. He uses machine greater than 4 hours 5 out of 30 days for compliance of 17%. His residual AHI is 40.2 on 10 cm water with EPR of 3. He states that he did go approximately 1 week without using the machine as he was out camping at [employer] and did not have electricity. His leak in the 95th percentile is 19.9 L/m. He is currently using the nasal pillows. He does not feel that his mask is leaking at night. He does state that he gets up frequently during the night and feels that is why he is unable to use a greater than 4 hours every night. He returns today for an evaluation.  REVIEW OF SYSTEMS: Out of a complete 14 system review of symptoms,  the patient complains only of the following symptoms, and all other reviewed systems are negative.  See HPI  ALLERGIES: Allergies  Allergen Reactions  . Hydrocodone Other (See Comments)    Throat itchy, weakness- took this during stroke not sure if caused problem    HOME MEDICATIONS: Outpatient Medications Prior to Visit  Medication Sig Dispense Refill  . ASPIRIN LOW DOSE 81 MG EC tablet TAKE 1 TABLET (81 MG TOTAL) BY MOUTH DAILY. SWALLOW WHOLE. 90 tablet 2  . atorvastatin (LIPITOR) 80 MG tablet Take 1 tablet (80 mg total) by mouth daily. 90 tablet 3  . hydrochlorothiazide (HYDRODIURIL) 12.5 MG tablet Take 12.5 mg by mouth daily.     [name] losartan (COZAAR) 100 MG tablet Take 50 mg by mouth daily.    . nitroGLYCERIN (NITROSTAT) 0.4 MG SL tablet Place 1 tablet (0.4 mg total) under the tongue every 5 (five) minutes as needed for chest pain. 25 tablet 3  . Omega-3 Fatty Acids (FISH OIL) 1200 MG CAPS Take 1,200 mg by mouth daily.    [name] omeprazole (PRILOSEC) 40 MG capsule Take 40 mg by mouth daily.    . fenofibrate (TRICOR) 145 MG tablet TAKE 1 TABLET BY MOUTH EVERY DAY 90 tablet 3  . metoprolol tartrate (LOPRESSOR) 25 MG tablet TAKE 1  TABLET BY MOUTH EVERY DAY 90 tablet 3  . tamsulosin (FLOMAX) 0.4 MG CAPS capsule Take 0.4 mg by mouth daily.     No facility-administered medications prior to visit.     PAST MEDICAL HISTORY: Past Medical History:  Diagnosis Date  . Arthritis   . CAD (coronary artery disease)    a. s/p prior MI; hx of stent to RCA and LAD;  b. LHC 12/2006:  LM 20%, pLAD 40%, mLAD stent ok, apical LAD 90% (1.5-51mm vessel), oD1 40-50%, AV groove CFX 30%, oOM1 30%, pRCA 30% (multiple), mRCA stent ok with 30-40% ISR, dRCA 30%, dAnt, inf-apical severe HK, EF 40% => med Rx.;  12/2013 Cath: LM nl, LAD 95ost, 17m ISR, 99apex, LCX nl, RCA 40p, 100 ISR, L->R collats, EF 35-40%-->pending CABG  . Chronic back pain    lumbar compression fracture and stenosis  . GERD (gastroesophageal reflux  disease)    occ  . Hypertension    takes Losartan,HCTZ,  and Metoprolol daily  . Ischemic cardiomyopathy    a. Echo 09/2012:  Mod LVH, focal basal hypertrophy, EF 30-35%, mid to dist inf-lat HK, Gr 1 diast dysfn, mild to mod LAE  . Mixed hyperlipidemia    takes Atorvastatin daily  . Myocardial infarction ([place]) 2015   x2  . Peripheral vascular disease ([place])   . Sleep apnea    not using cpap at present had new test and getting new cpap  . Stroke ([place]) 03/2016  . Urinary frequency    takes Flomax daily    PAST SURGICAL HISTORY: Past Surgical History:  Procedure Laterality Date  . ANTERIOR CERVICAL DECOMP/DISCECTOMY FUSION N/A 06/14/2016   Procedure: ANTERIOR CERVICAL DECOMPRESSION/DISCECTOMY FUSION C4 - C5, CERVICAL 6-C7 2 LEVEL;  Surgeon: Melina Schools, MD;  Location: [place];  Service: Orthopedics;  Laterality: N/A;  . CARDIAC CATHETERIZATION  06/27/2001; 12/13/2006; 02/20/2010  . COLONOSCOPY    . CORONARY ANGIOPLASTY WITH STENT PLACEMENT  05/22/2001   PCI - RCA - LAD  . CORONARY ANGIOPLASTY WITH STENT PLACEMENT  11/11/2006   PCI - BMS - LAD  . CORONARY ANGIOPLASTY WITH STENT PLACEMENT  11/15/2006   PCI - BMS - RCA  . CORONARY ANGIOPLASTY WITH STENT PLACEMENT  03/16/2010  . CORONARY ARTERY BYPASS GRAFT N/A 01/01/2014   Procedure: Coronary artery bypass graft times two using left internal mammary artery and left leg greater saphenous vein harvested endoscopically.;  Surgeon: Gaye Pollack, MD;  Location: [facility] OR;  Service: Open Heart Surgery;  Laterality: N/A;  . EP IMPLANTABLE DEVICE N/A 04/05/2016   Procedure: Loop Recorder Insertion;  Surgeon: Evans Lance, MD;  Location: [place] [facility];  Service: Cardiovascular;  Laterality: N/A;  . FOOT SURGERY Right    achilles tendon reattached  . HAND SURGERY Right    screws  . INTRAOPERATIVE TRANSESOPHAGEAL ECHOCARDIOGRAM N/A 01/01/2014   Procedure: INTRAOPERATIVE TRANSESOPHAGEAL ECHOCARDIOGRAM;  Surgeon: Gaye Pollack, MD;  Location: [facility];   Service: Open Heart Surgery;  Laterality: N/A;  . KNEE ARTHROSCOPY     left knee x 1, right knee x 2  . KYPHOPLASTY N/A 12/06/2016   Procedure: KYPHOPLASTY L4 ;  Surgeon: Melina Schools, MD;  Location: [place];  Service: Orthopedics;  Laterality: N/A;  Requests for 3 hours  . LEFT HEART CATHETERIZATION WITH CORONARY ANGIOGRAM N/A 12/21/2013   Procedure: LEFT HEART CATHETERIZATION WITH CORONARY ANGIOGRAM;  Surgeon: Burnell Blanks, MD;  Location: [facility] CATH LAB;  Service: Cardiovascular;  Laterality: N/A;  . LUMBAR LAMINECTOMY/DECOMPRESSION  MICRODISCECTOMY Right 12/06/2016   Procedure: L4-S1 decompression right side ;  Surgeon: Melina Schools, MD;  Location: [place];  Service: Orthopedics;  Laterality: Right;  . OPEN REDUCTION INTERNAL FIXATION (ORIF) SCAPHOID WITH ILIAC CREST BONE GRAFT  10/15/2012   Procedure: OPEN REDUCTION INTERNAL FIXATION (ORIF) SCAPHOID WITH ILIAC CREST BONE GRAFT;  Surgeon: Schuyler Amor, MD;  Location: [place];  Service: Orthopedics;  Laterality: Right;  No iliac creast bone graft taken, used graft from radius    FAMILY HISTORY: Family History  Problem Relation Age of Onset  . Heart disease Father   . Heart disease Maternal Grandmother   . Heart disease Maternal Grandfather   . Heart disease Paternal Grandmother   . Heart disease Paternal Grandfather     SOCIAL HISTORY: Social History   Socioeconomic History  . Marital status: Significant Other    Spouse name: [name]  . Number of children: 2  . Years of education: 60  . Highest education level: Not on file  Occupational History  . Occupation: Disabled   Social Needs  . Financial resource strain: Not on file  . Food insecurity    Worry: Not on file    Inability: Not on file  . Transportation needs    Medical: Not on file    Non-medical: Not on file  Tobacco Use  . Smoking status: Former Smoker    Packs/day: 1.00    Years: 20.00    Pack years: 20.00    Types: Cigarettes  .  Smokeless tobacco: Never Used  . Tobacco comment: quit smoking 3-4 months ago  Substance and Sexual Activity  . Alcohol use: No    Alcohol/week: 0.0 standard drinks    Comment: nothing in 3-4 months  . Drug use: No  . Sexual activity: Not on file  Lifestyle  . Physical activity    Days per week: Not on file    Minutes per session: Not on file  . Stress: Not on file  Relationships  . Social Herbalist on phone: Not on file    Gets together: Not on file    Attends religious service: Not on file    Active member of club or organization: Not on file    Attends meetings of clubs or organizations: Not on file    Relationship status: Not on file  . Intimate partner violence    Fear of current or ex partner: Not on file    Emotionally abused: Not on file    Physically abused: Not on file    Forced sexual activity: Not on file  Other Topics Concern  . Not on file  Social History Narrative   Lives with [name]   Caffeine use: Drinks coffee/tea/soda (2 cups coffee/morning)      PHYSICAL EXAM  Vitals:   08/25/19 1127  BP: 116/78  Pulse: 70  Temp: 97.9 F (36.6 C)  TempSrc: Oral  Weight: 199 lb 12.8 oz (90.6 kg)  Height: 5\' 7"  (1.702 m)   Body mass index is 31.29 kg/m.  Generalized: Well developed, in no acute distress   Neurological examination  Mentation: Alert oriented to time, place, history taking. Follows all commands speech and language fluent Cranial nerve II-XII: Pupils were equal round reactive to light. Extraocular movements were full, visual field were full on confrontational test. Head turning and shoulder shrug  were normal and symmetric. Motor: The motor testing reveals 5 over 5 strength of all 4 extremities. Good symmetric motor  tone is noted throughout.  Sensory: Sensory testing is intact to soft touch on all 4 extremities. No evidence of extinction is noted.  Coordination: Cerebellar testing reveals good finger-nose-finger and heel-to-shin  bilaterally.  Gait and station: Patient uses a cane when ambulating.  Gait is slow and cautious. Reflexes: Deep tendon reflexes are symmetric and normal bilaterally.   DIAGNOSTIC DATA (LABS, IMAGING, TESTING) - I reviewed patient records, labs, notes, testing and imaging myself where available.  Lab Results  Component Value Date   WBC WILL FOLLOW 03/08/2017   WBC 9.0 03/08/2017   HGB WILL FOLLOW 03/08/2017   HGB 13.8 03/08/2017   HCT WILL FOLLOW 03/08/2017   HCT 40.7 03/08/2017   MCV WILL FOLLOW 03/08/2017   MCV 87 03/08/2017   PLT WILL FOLLOW 03/08/2017   PLT 197 03/08/2017      Component Value Date/Time   NA 142 03/08/2017 1213   K 3.9 03/08/2017 1213   CL 101 03/08/2017 1213   CO2 20 03/08/2017 1213   GLUCOSE 102 (H) 03/08/2017 1213   GLUCOSE 118 (H) 11/28/2016 0922   BUN 16 03/08/2017 1213   CREATININE 1.08 03/08/2017 1213   CALCIUM 9.5 03/08/2017 1213   PROT 6.5 03/08/2017 1213   ALBUMIN 4.3 03/08/2017 1213   AST 17 03/08/2017 1213   ALT 16 03/08/2017 1213   ALKPHOS 86 03/08/2017 1213   BILITOT 0.6 03/08/2017 1213   GFRNONAA 74 03/08/2017 1213   GFRAA 85 03/08/2017 1213   Lab Results  Component Value Date   CHOL 181 03/17/2016   HDL 47 03/17/2016   LDLCALC 118 (H) 03/17/2016   TRIG 80 03/17/2016   CHOLHDL 3.9 03/17/2016   Lab Results  Component Value Date   HGBA1C 5.1 03/17/2016   No results found for: VITAMINB12 No results found for: TSH    ASSESSMENT AND PLAN 63 y.o. year old male  has a past medical history of Arthritis, CAD (coronary artery disease), Chronic back pain, GERD (gastroesophageal reflux disease), Hypertension, Ischemic cardiomyopathy, Mixed hyperlipidemia, Myocardial infarction ([place]) (2015), Peripheral vascular disease ([place]), Sleep apnea, Stroke ([place]) (03/2016), and Urinary frequency. here with:  1.  Obstructive sleep apnea on CPAP 2.  History of stroke  I will send an order to [facility] [facility] requesting that he be placed on BiPAP therapy.   Because it has been 2-1/2 years since his titration insurance may require a repeat study.  Patient was advised of this.  In my opinion there is no limitations that would interfere with his driving.  He returns today for an evaluation.    Ward Givens, MSN, NP-C 08/25/2019, 11:48 AM [facility] [address], [place],  [address] [phone]  I reviewed the above note and documentation by the Nurse Practitioner and agree with the history, exam, assessment and plan as outlined above. I was available for consultation. Star Age, MD, PhD [facility])

## 2019-08-26 NOTE — Progress Notes (Addendum)
CMM sent [employer] for new bipap orders. sy  Received. sy

## 2019-08-29 IMAGING — US ULTRASOUND ABDOMEN COMPLETE
1 series · 14 of 25 positions shown · non-contrast
Comparison: None.

CLINICAL DATA: Abdominal bloating.

EXAM:
ABDOMEN ULTRASOUND COMPLETE

[Series 1: ultrasound abdomen complete · 0.15mm/px · 14 of 112 slices shown]
[im 1/112]
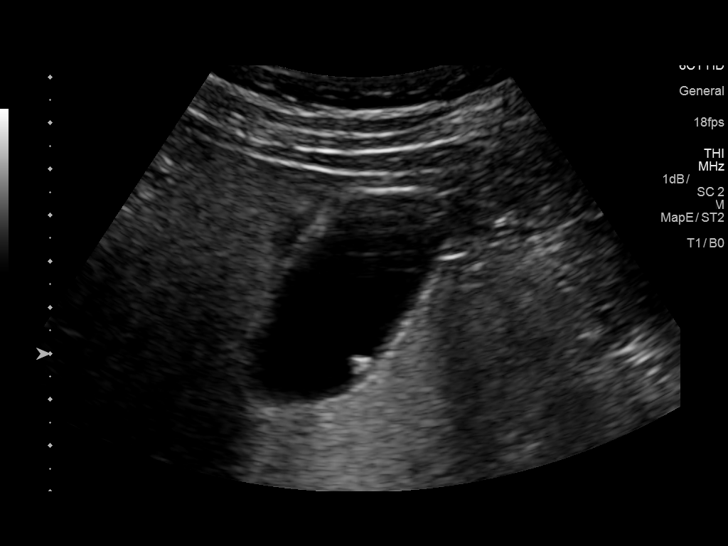
[im 10/112]
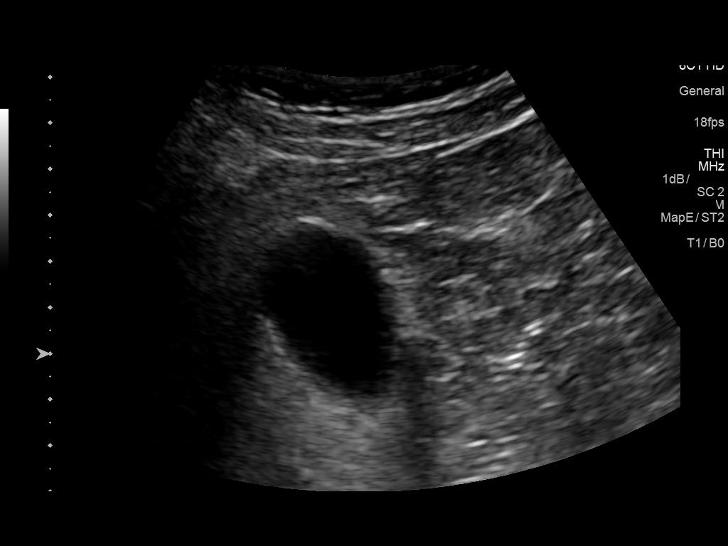
[im 19/112]
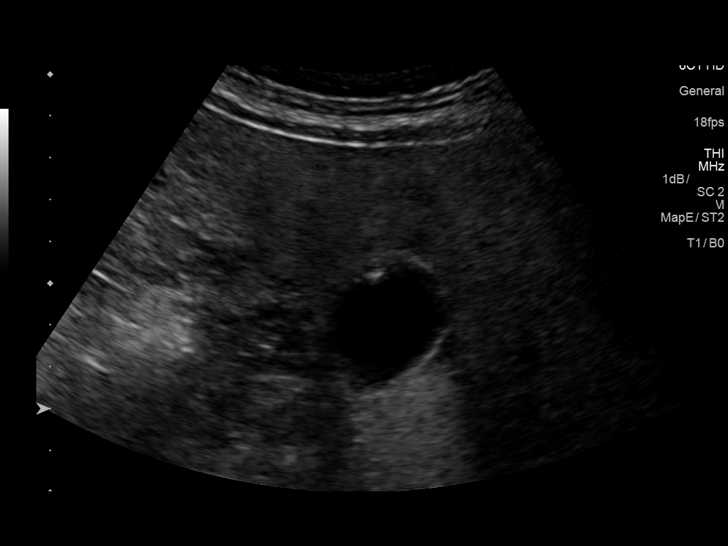
[im 28/112]
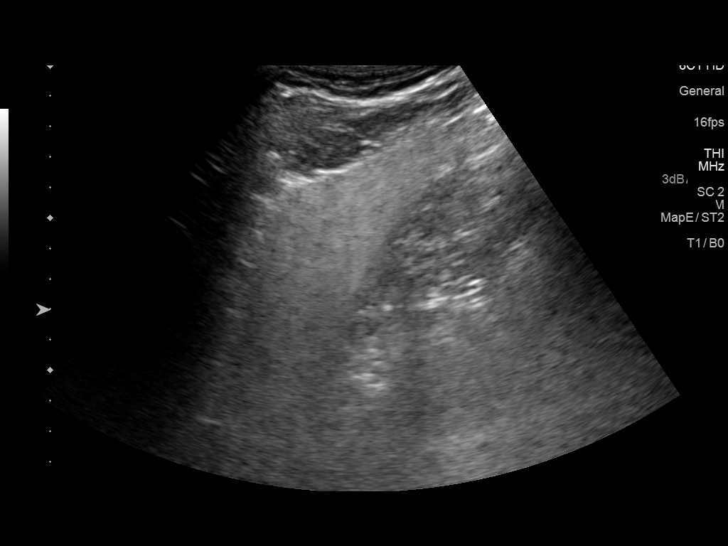
[im 38/112]
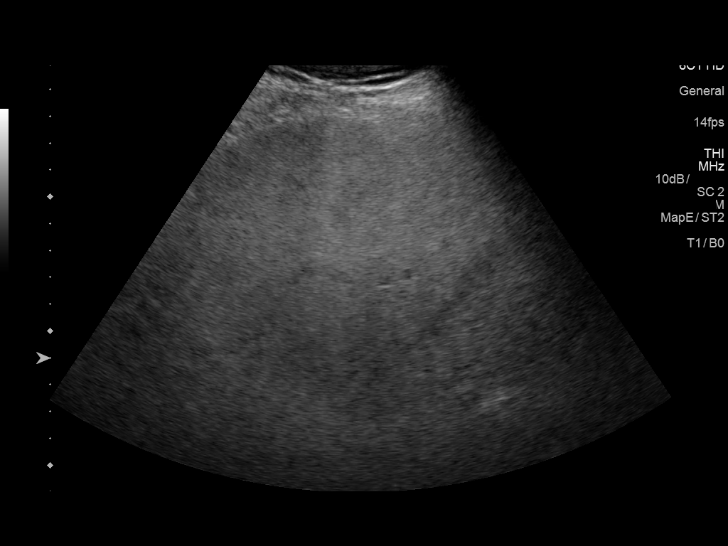
[im 42/112]
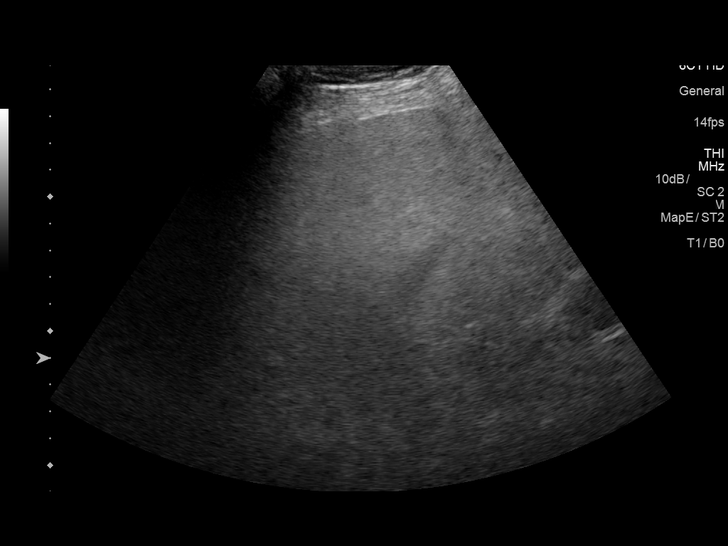
[im 51/112]
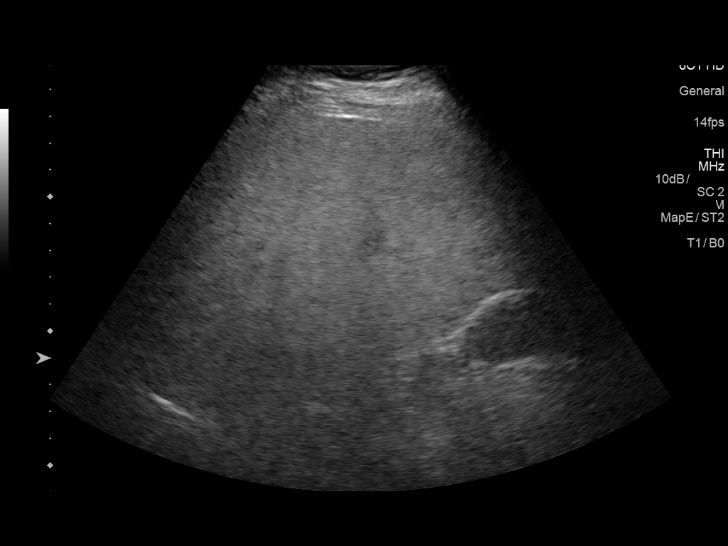
[im 61/112]
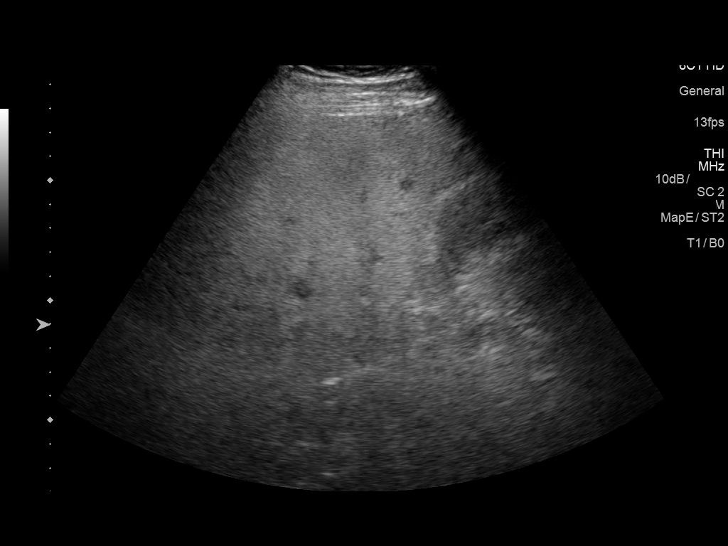
[im 70/112]
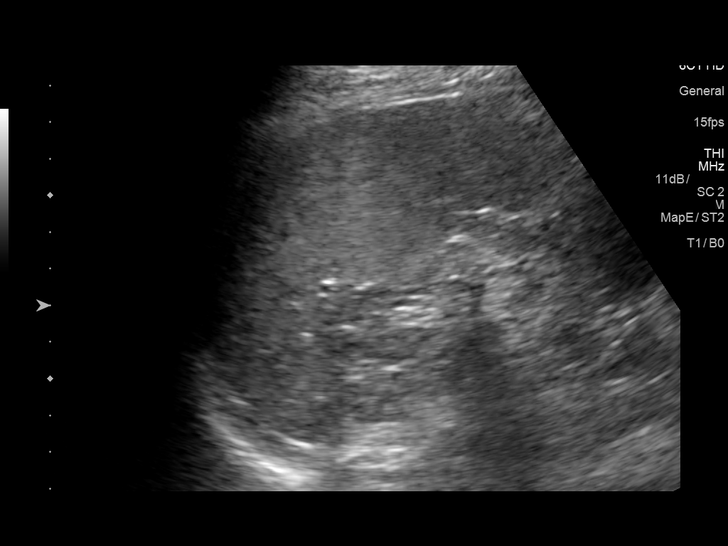
[im 75/112]
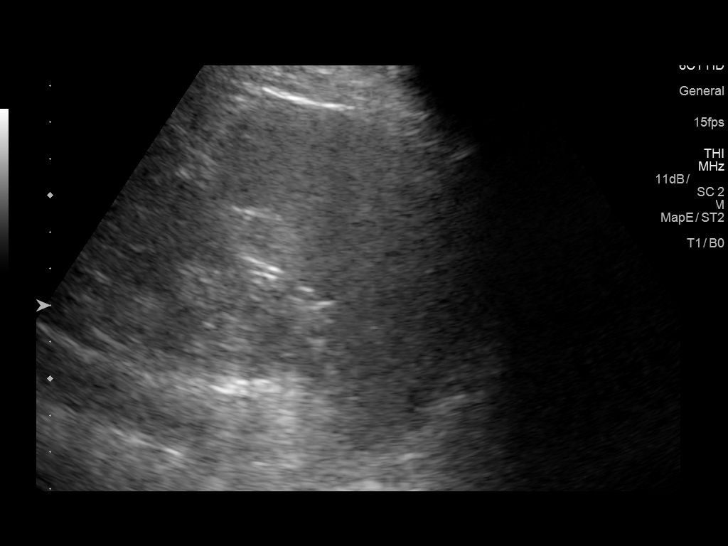
[im 84/112]
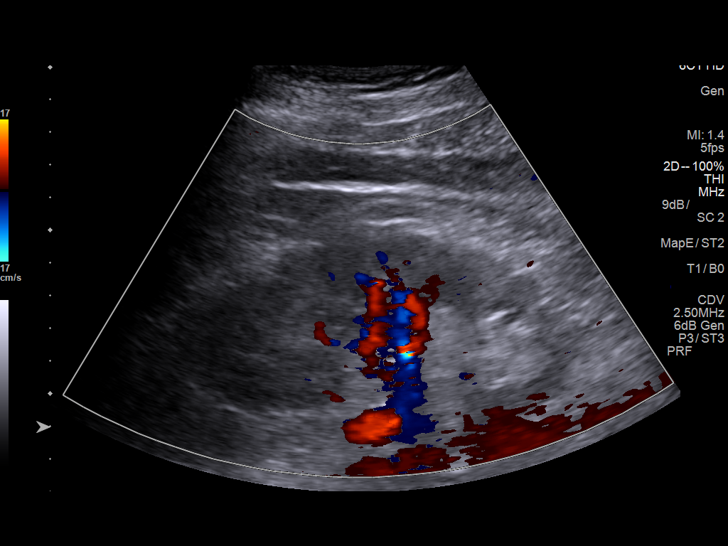
[im 93/112]
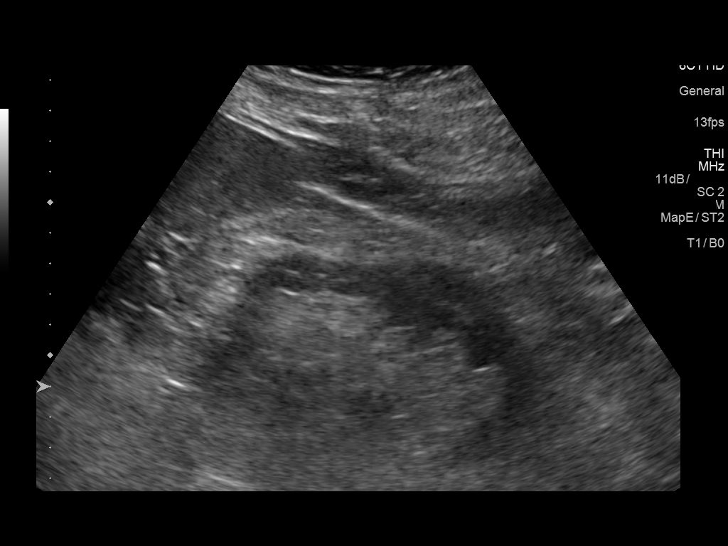
[im 102/112]
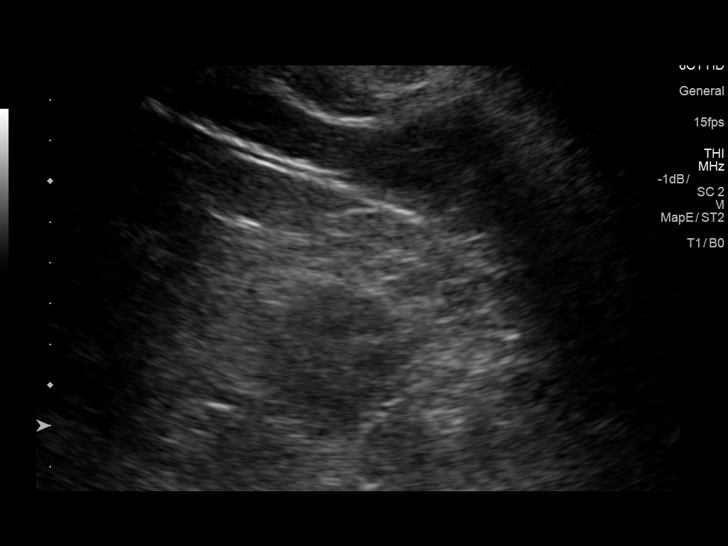
[im 112/112]
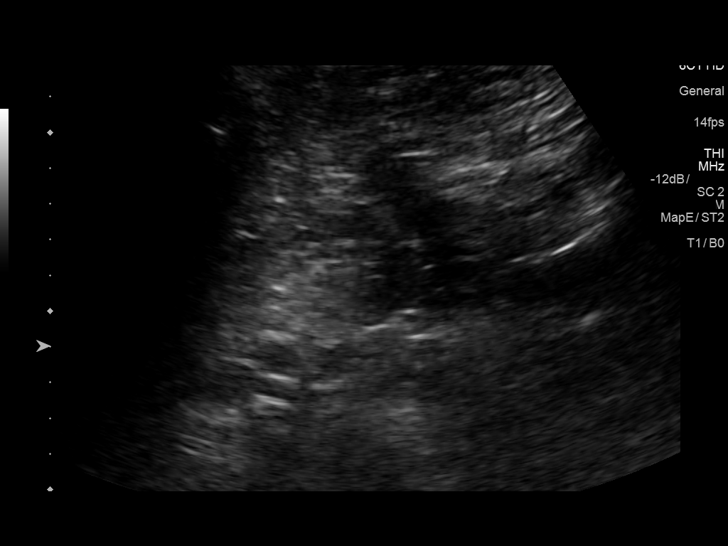

[14 of 25 positions shown; findings below may reference images not displayed]

FINDINGS: Gallbladder: There is a single mobile stone in the gallbladder
measuring 6 mm. No wall thickening, pericholecystic fluid, or
Murphy's sign.

Common bile duct: Diameter: 5 mm

Liver: Increased echogenicity in the liver. No focal mass. Portal
vein is patent on color Doppler imaging with normal direction of
blood flow towards the liver.

IVC: No abnormality visualized.

Pancreas: Visualized portion unremarkable.

Spleen: Size and appearance within normal limits.

Right Kidney: Length: 12 cm. Echogenicity within normal limits. No
mass or hydronephrosis visualized.

Left Kidney: Length: 11.8 cm. Echogenicity within normal limits. No
mass or hydronephrosis visualized.

Abdominal aorta: No aneurysm visualized.

Other findings: None.
IMPRESSION: 1. There is a single mobile stone in the gallbladder. The
gallbladder is otherwise normal.
2. Increased echogenicity in the liver with no focal mass. This
finding is nonspecific but often seen with hepatic steatosis.

## 2019-08-31 ENCOUNTER — Telehealth: Payer: Self-pay | Admitting: *Deleted

## 2019-08-31 ENCOUNTER — Encounter: Payer: Self-pay | Admitting: *Deleted

## 2019-08-31 NOTE — Telephone Encounter (Signed)
Letter given to MM/NP for review and signature.  Will also send ofv note.  To MR.

## 2019-09-03 NOTE — Progress Notes (Signed)
Received confirmation of approval for DME-Bipap / [employer] ID [id number]. [facility] Psychologist, sport and exercise.  [phone]. Auth # [id number]. Start date 08-29-19 thru 11-27-19.

## 2019-09-09 ENCOUNTER — Other Ambulatory Visit: Payer: Self-pay | Admitting: Cardiovascular Disease

## 2019-10-20 ENCOUNTER — Other Ambulatory Visit: Payer: Self-pay | Admitting: Cardiovascular Disease

## 2019-10-23 ENCOUNTER — Telehealth: Payer: Self-pay | Admitting: Cardiovascular Disease

## 2019-10-23 NOTE — Telephone Encounter (Signed)
Need to clarify if pt is taking this - it's not on his med list and states it was discontinued on 08/25/19.  If he is, fenofibrate is already a generic - there are about 20 different tablet/capsule strengths that fenofibrate comes in and insurance can be picky about which strengths it covers. Is he keeping his [employer] insurance next year? The 2021 formulary shows that the 145mg  tablet is still covered so I'm not sure what the issue is. His formulary for this plan states it covers fenofibrate 145mg , 160mg , 134mg , 150mg , and the micronized capsule 200mg  formulation which would all be comparable doses.

## 2019-10-23 NOTE — Telephone Encounter (Signed)
Patient's insurance is changing to [facility]. Fenofibrate 145 mg daily and Metoprolol 25 mg daily were taken off patient's list by error. Dr. Johnsie Cancel did not discontinue either medication and patient is still taking both. Added medications back to patient's list. Will see if Pharm D has any recommendations.

## 2019-10-23 NOTE — Telephone Encounter (Signed)
Informed patient that Fenofibrate is already generic and it also depends on which [employer] he has. Informed patient to find out what insurance is going to cover and if he is going to need assistance to give our office a call, that we have a nurse (pre auth) who can see if there is anything she can do to help. Patient verbalized understanding .

## 2019-10-23 NOTE — Telephone Encounter (Signed)
Pt c/o medication issue:  1. Name of Medication: fenofibrate (Tricor) 145 mg Tablet  2. How are you currently taking this medication (dosage and times per day)? 1 tablet once daily  3. Are you having a reaction (difficulty breathing--STAT)? no  4. What is your medication issue? Patient got a call stating his insurance does not cover this medication any more. He wanted to know if there was a generic version or a substitution that Dr. Johnsie Cancel could put him on  That his insurance would cover

## 2019-10-23 NOTE — Telephone Encounter (Signed)
It will depend which specific [employer] plan pt has ([employer] - [employer] Value Rx, Premier Rx, or Basic Rx) - they will all have different formularies.

## 2019-10-27 ENCOUNTER — Telehealth: Payer: Self-pay

## 2019-10-27 MED ORDER — FENOFIBRATE 145 MG PO TABS: 145.0000 mg | ORAL_TABLET | Freq: Every day | ORAL | 3 refills | Status: DC

## 2019-10-27 NOTE — Telephone Encounter (Signed)
This [facility] be just [facility] issue - [facility] has it in stock. Would see if pt wants his rx transferred to a [facility]. Otherwise, could try changing mg strength on his fenofibrate, but there are about 20 different strengths and insurance can be hit or miss on which ones they cover.

## 2019-10-27 NOTE — Telephone Encounter (Signed)
[facility].Marland KitchenMarland KitchenFenofibrate 145mg  is on backorder. Pt is requesting to change to a different medication.   [facility] [address] [place]  Dr Johnsie Cancel and his RN, Jeannene Patella are out the rest of the week.  Will fwd to triage.

## 2019-10-27 NOTE — Telephone Encounter (Signed)
Follow up ° ° °Patient is returning call per the previous message. Please call. °

## 2019-10-27 NOTE — Telephone Encounter (Signed)
Left message to call back  

## 2019-10-27 NOTE — Telephone Encounter (Signed)
Spoke to patient and made him aware that [facility] has a supply of the fenofibrate. Rx sent to [facility] in [place]. Instructed patient to let us know if he has any additional issues with getting his meds. He verbalized understanding and thanked me for the call.

## 2019-11-30 ENCOUNTER — Other Ambulatory Visit: Payer: Self-pay

## 2019-11-30 MED ORDER — ASPIRIN 81 MG PO TBEC: 81.0000 mg | DELAYED_RELEASE_TABLET | Freq: Every day | ORAL | 0 refills | Status: DC

## 2019-11-30 MED ORDER — ATORVASTATIN CALCIUM 80 MG PO TABS: 80.0000 mg | ORAL_TABLET | Freq: Every day | ORAL | 0 refills | Status: DC

## 2019-12-01 ENCOUNTER — Other Ambulatory Visit: Payer: Self-pay | Admitting: Cardiovascular Disease

## 2019-12-01 MED ORDER — FENOFIBRATE 145 MG PO TABS: 145.0000 mg | ORAL_TABLET | Freq: Every day | ORAL | 0 refills | Status: DC

## 2019-12-01 MED ORDER — METOPROLOL TARTRATE 25 MG PO TABS: 25.0000 mg | ORAL_TABLET | Freq: Every day | ORAL | 0 refills | Status: DC

## 2019-12-01 MED ORDER — LOSARTAN POTASSIUM 100 MG PO TABS: 100.0000 mg | ORAL_TABLET | Freq: Every day | ORAL | 0 refills | Status: DC

## 2019-12-04 DIAGNOSIS — Z8673 Personal history of transient ischemic attack (TIA), and cerebral infarction without residual deficits: Secondary | ICD-10-CM | POA: Insufficient documentation

## 2019-12-07 ENCOUNTER — Other Ambulatory Visit: Payer: Self-pay | Admitting: Cardiovascular Disease

## 2020-01-12 ENCOUNTER — Other Ambulatory Visit: Payer: Self-pay | Admitting: Gastroenterology

## 2020-01-20 ENCOUNTER — Other Ambulatory Visit: Payer: Self-pay | Admitting: Cardiovascular Disease

## 2020-01-22 ENCOUNTER — Ambulatory Visit: Payer: Medicare HMO | Attending: Internal Medicine

## 2020-01-22 DIAGNOSIS — Z23 Encounter for immunization: Secondary | ICD-10-CM

## 2020-01-22 NOTE — Progress Notes (Signed)
   Covid-19 Vaccination Clinic  Name:  [name]    MRN: [id number] DOB: [birth year]  01/22/2020  [name] was observed post Covid-19 immunization for 15 minutes without incident. He was provided with Vaccine Information Sheet and instruction to access the V-Safe system.   [name] was instructed to call 911 with any severe reactions post vaccine: [name] Difficulty breathing  . Swelling of face and throat  . A fast heartbeat  . A bad rash all over body  . Dizziness and weakness   Immunizations Administered    Name Date Dose VIS Date Route   Moderna COVID-19 Vaccine 01/22/2020  9:23 AM 0.5 mL 10/06/2019 Intramuscular   Manufacturer: Moderna   Lot: [id number]   [id number]

## 2020-01-27 ENCOUNTER — Other Ambulatory Visit: Payer: Self-pay | Admitting: Cardiovascular Disease

## 2020-01-29 ENCOUNTER — Inpatient Hospital Stay (HOSPITAL_COMMUNITY): Admission: RE | Admit: 2020-01-29 | Payer: Medicare HMO | Source: Ambulatory Visit

## 2020-02-19 ENCOUNTER — Other Ambulatory Visit: Payer: Self-pay | Admitting: Cardiovascular Disease

## 2020-02-23 ENCOUNTER — Ambulatory Visit: Payer: Medicare HMO | Attending: Internal Medicine

## 2020-02-23 DIAGNOSIS — Z23 Encounter for immunization: Secondary | ICD-10-CM

## 2020-02-23 NOTE — Progress Notes (Signed)
   Covid-19 Vaccination Clinic  Name:  [name]    MRN: [id number] DOB: May 27, 1956  02/23/2020  [name] was observed post Covid-19 immunization for 15 minutes without incident. He was provided with Vaccine Information Sheet and instruction to access the V-Safe system.   [name] was instructed to call 911 with any severe reactions post vaccine: [name] Difficulty breathing  . Swelling of face and throat  . A fast heartbeat  . A bad rash all over body  . Dizziness and weakness   Immunizations Administered    Name Date Dose VIS Date Route   Moderna COVID-19 Vaccine 02/23/2020  8:49 AM 0.5 mL 10/2019 Intramuscular   Manufacturer: Moderna   Lot: [id number]   [id number]

## 2020-02-24 ENCOUNTER — Other Ambulatory Visit: Payer: Self-pay | Admitting: Cardiovascular Disease

## 2020-02-29 ENCOUNTER — Other Ambulatory Visit (HOSPITAL_COMMUNITY)
Admission: RE | Admit: 2020-02-29 | Discharge: 2020-02-29 | Disposition: A | Discharge status: Home / Self Care | Payer: Medicare HMO | Source: Ambulatory Visit | Attending: Gastroenterology | Admitting: Gastroenterology

## 2020-02-29 DIAGNOSIS — Z20822 Contact with and (suspected) exposure to covid-19: Secondary | ICD-10-CM | POA: Diagnosis not present

## 2020-02-29 DIAGNOSIS — Z01812 Encounter for preprocedural laboratory examination: Secondary | ICD-10-CM | POA: Insufficient documentation

## 2020-02-29 LAB — SARS CORONAVIRUS 2 (TAT 6-24 HRS): SARS Coronavirus 2: NEGATIVE

## 2020-03-03 ENCOUNTER — Ambulatory Visit (HOSPITAL_COMMUNITY): Payer: Medicare HMO | Admitting: Anesthesiology

## 2020-03-03 ENCOUNTER — Other Ambulatory Visit: Payer: Self-pay

## 2020-03-03 ENCOUNTER — Ambulatory Visit (HOSPITAL_COMMUNITY)
Admission: RE | Admit: 2020-03-03 | Discharge: 2020-03-03 | Disposition: A | Discharge status: Home / Self Care | Payer: Medicare HMO | Attending: Gastroenterology | Admitting: Gastroenterology

## 2020-03-03 ENCOUNTER — Encounter (HOSPITAL_COMMUNITY)
Admission: RE | Disposition: A | Discharge status: Home / Self Care | Payer: Self-pay | Source: Home / Self Care | Attending: Gastroenterology

## 2020-03-03 ENCOUNTER — Encounter (HOSPITAL_COMMUNITY): Payer: Self-pay | Admitting: Gastroenterology

## 2020-03-03 DIAGNOSIS — I509 Heart failure, unspecified: Secondary | ICD-10-CM | POA: Insufficient documentation

## 2020-03-03 DIAGNOSIS — K219 Gastro-esophageal reflux disease without esophagitis: Secondary | ICD-10-CM | POA: Insufficient documentation

## 2020-03-03 DIAGNOSIS — Z951 Presence of aortocoronary bypass graft: Secondary | ICD-10-CM | POA: Insufficient documentation

## 2020-03-03 DIAGNOSIS — D120 Benign neoplasm of cecum: Secondary | ICD-10-CM | POA: Diagnosis not present

## 2020-03-03 DIAGNOSIS — I739 Peripheral vascular disease, unspecified: Secondary | ICD-10-CM | POA: Diagnosis not present

## 2020-03-03 DIAGNOSIS — K648 Other hemorrhoids: Secondary | ICD-10-CM | POA: Diagnosis not present

## 2020-03-03 DIAGNOSIS — F17210 Nicotine dependence, cigarettes, uncomplicated: Secondary | ICD-10-CM | POA: Diagnosis not present

## 2020-03-03 DIAGNOSIS — K449 Diaphragmatic hernia without obstruction or gangrene: Secondary | ICD-10-CM | POA: Diagnosis not present

## 2020-03-03 DIAGNOSIS — Z885 Allergy status to narcotic agent status: Secondary | ICD-10-CM | POA: Insufficient documentation

## 2020-03-03 DIAGNOSIS — K625 Hemorrhage of anus and rectum: Secondary | ICD-10-CM | POA: Insufficient documentation

## 2020-03-03 DIAGNOSIS — Z955 Presence of coronary angioplasty implant and graft: Secondary | ICD-10-CM | POA: Diagnosis not present

## 2020-03-03 DIAGNOSIS — E782 Mixed hyperlipidemia: Secondary | ICD-10-CM | POA: Insufficient documentation

## 2020-03-03 DIAGNOSIS — M1990 Unspecified osteoarthritis, unspecified site: Secondary | ICD-10-CM | POA: Diagnosis not present

## 2020-03-03 DIAGNOSIS — I252 Old myocardial infarction: Secondary | ICD-10-CM | POA: Insufficient documentation

## 2020-03-03 DIAGNOSIS — Z7982 Long term (current) use of aspirin: Secondary | ICD-10-CM | POA: Insufficient documentation

## 2020-03-03 DIAGNOSIS — I2510 Atherosclerotic heart disease of native coronary artery without angina pectoris: Secondary | ICD-10-CM | POA: Diagnosis not present

## 2020-03-03 DIAGNOSIS — Z79899 Other long term (current) drug therapy: Secondary | ICD-10-CM | POA: Insufficient documentation

## 2020-03-03 DIAGNOSIS — R12 Heartburn: Secondary | ICD-10-CM | POA: Insufficient documentation

## 2020-03-03 DIAGNOSIS — I110 Hypertensive heart disease with heart failure: Secondary | ICD-10-CM | POA: Insufficient documentation

## 2020-03-03 DIAGNOSIS — D122 Benign neoplasm of ascending colon: Secondary | ICD-10-CM | POA: Insufficient documentation

## 2020-03-03 DIAGNOSIS — G4730 Sleep apnea, unspecified: Secondary | ICD-10-CM | POA: Insufficient documentation

## 2020-03-03 DIAGNOSIS — K2950 Unspecified chronic gastritis without bleeding: Secondary | ICD-10-CM | POA: Diagnosis not present

## 2020-03-03 DIAGNOSIS — I255 Ischemic cardiomyopathy: Secondary | ICD-10-CM | POA: Insufficient documentation

## 2020-03-03 DIAGNOSIS — Z8673 Personal history of transient ischemic attack (TIA), and cerebral infarction without residual deficits: Secondary | ICD-10-CM | POA: Diagnosis not present

## 2020-03-03 HISTORY — PX: COLONOSCOPY WITH PROPOFOL: SHX5780

## 2020-03-03 HISTORY — PX: BIOPSY: SHX5522

## 2020-03-03 HISTORY — PX: ESOPHAGOGASTRODUODENOSCOPY (EGD) WITH PROPOFOL: SHX5813

## 2020-03-03 HISTORY — PX: POLYPECTOMY: SHX5525

## 2020-03-03 SURGERY — ESOPHAGOGASTRODUODENOSCOPY (EGD) WITH PROPOFOL
Anesthesia: Monitor Anesthesia Care

## 2020-03-03 MED ORDER — PROPOFOL 1000 MG/100ML IV EMUL
INTRAVENOUS | Status: AC
  Filled 2020-03-03: qty 100

## 2020-03-03 MED ORDER — PROPOFOL 500 MG/50ML IV EMUL
INTRAVENOUS | Status: DC | PRN
  Administered 2020-03-03: 50 mg via INTRAVENOUS

## 2020-03-03 MED ORDER — PROPOFOL 500 MG/50ML IV EMUL
INTRAVENOUS | Status: DC | PRN
  Administered 2020-03-03: 130 ug/kg/min via INTRAVENOUS

## 2020-03-03 MED ORDER — SODIUM CHLORIDE 0.9 % IV SOLN: INTRAVENOUS | Status: DC

## 2020-03-03 MED ORDER — LACTATED RINGERS IV SOLN: INTRAVENOUS | Status: DC

## 2020-03-03 MED ORDER — HYDROCORTISONE ACETATE 25 MG RE SUPP: 25.0000 mg | Freq: Two times a day (BID) | RECTAL | 0 refills | Status: AC

## 2020-03-03 SURGICAL SUPPLY — 24 items

## 2020-03-03 NOTE — Discharge Instructions (Signed)

## 2020-03-03 NOTE — Op Note (Signed)
[facility] Patient Name: [name] Procedure Date: 03/03/2020 MRN: [id number] Attending MD: Otis Brace , MD Date of Birth: Feb 27, 1956 CSN: [id number] Age: 64 Admit Type: Outpatient Procedure:                Colonoscopy Indications:              Last colonoscopy 10 years ago, Rectal bleeding Providers:                Otis Brace, MD, Lauralyn Primes Tech., Technician, Rosario Adie, CRNA Referring MD:              Medicines:                Sedation Administered by an Anesthesia Professional Complications:            No immediate complications. Estimated Blood Loss:     Estimated blood loss was minimal. Procedure:                Pre-Anesthesia Assessment:                           - Prior to the procedure, a History and Physical                            was performed, and patient medications and                            allergies were reviewed. The patient's tolerance of                            previous anesthesia was also reviewed. The risks                            and benefits of the procedure and the sedation                            options and risks were discussed with the patient.                            All questions were answered, and informed consent                            was obtained. Prior Anticoagulants: The patient has                            taken no previous anticoagulant or antiplatelet                            agents except for aspirin. ASA Grade Assessment:                            III - A patient with severe systemic disease. After  reviewing the risks and benefits, the patient was                            deemed in satisfactory condition to undergo the                            procedure.                           After obtaining informed consent, the colonoscope                            was passed under direct vision. Throughout  the                            procedure, the patient's blood pressure, pulse, and                            oxygen saturations were monitored continuously. The                            [id number] Olympus pediatric colonscope                            was introduced through the anus and advanced to the                            the cecum, identified by appendiceal orifice and                            ileocecal valve. The colonoscopy was technically                            difficult and complex. Successful completion of the                            procedure was aided by applying abdominal pressure.                            The patient tolerated the procedure well. The                            quality of the bowel preparation was good. Scope In: 9:53:38 AM Scope Out: 10:22:25 AM Scope Withdrawal Time: 0 hours 23 minutes 38 seconds  Total Procedure Duration: 0 hours 28 minutes 47 seconds  Findings:      Hemorrhoids were found on perianal exam.      A 10 mm polyp was found in the cecum. The polyp was sessile. The polyp       was removed with a hot snare. Resection and retrieval were complete.      A 6 mm polyp was found in the cecum. The polyp was sessile. The polyp       was removed with a cold snare. Resection and retrieval were complete.      A 10 mm polyp was found  in the ascending colon. The polyp was sessile.       The polyp was removed with a hot snare. Resection and retrieval were       complete.      A 5 mm polyp was found in the sigmoid colon. The polyp was sessile. The       polyp was removed with a cold snare. Resection and retrieval were       complete.      Internal hemorrhoids were found during retroflexion. The hemorrhoids       were medium-sized. Impression:               - Hemorrhoids found on perianal exam.                           - One 10 mm polyp in the cecum, removed with a hot                            snare. Resected and retrieved.                            - One 6 mm polyp in the cecum, removed with a cold                            snare. Resected and retrieved.                           - One 10 mm polyp in the ascending colon, removed                            with a hot snare. Resected and retrieved.                           - One 5 mm polyp in the sigmoid colon, removed with                            a cold snare. Resected and retrieved.                           - Internal hemorrhoids. Moderate Sedation:      Moderate (conscious) sedation was personally administered by an       anesthesia professional. The following parameters were monitored: oxygen       saturation, heart rate, blood pressure, and response to care. Recommendation:           - Patient has a contact number available for                            emergencies. The signs and symptoms of potential                            delayed complications were discussed with the                            patient. Return to normal activities tomorrow.  Written discharge instructions were provided to the                            patient.                           - Resume previous diet.                           - Continue present medications.                           - Await pathology results.                           - Repeat colonoscopy in 3 years for surveillance of                            multiple polyps.                           - Use Analpram HC Cream 2.5%: Apply externally BID                            for 2 weeks. Procedure Code(s):        --- Professional ---                           [phone], Colonoscopy, flexible; with removal of                            tumor(s), polyp(s), or other lesion(s) by snare                            technique Diagnosis Code(s):        --- Professional ---                           K64.8, Other hemorrhoids                           K63.5, Polyp of colon                            K62.5, Hemorrhage of anus and rectum CPT copyright 2019 American Medical Association. All rights reserved. The codes documented in this report are preliminary and upon coder review may  be revised to meet current compliance requirements. Otis Brace, MD Otis Brace, MD 03/03/2020 10:34:34 AM Number of Addenda: 0

## 2020-03-03 NOTE — Anesthesia Procedure Notes (Signed)
Date/Time: 03/03/2020 9:40 AM Performed by: Glory Buff, CRNA Oxygen Delivery Method: Simple face mask

## 2020-03-03 NOTE — Anesthesia Postprocedure Evaluation (Signed)
Anesthesia Post Note  Patient: [name]  Procedure(s) Performed: ESOPHAGOGASTRODUODENOSCOPY (EGD) WITH PROPOFOL (N/A ) COLONOSCOPY WITH PROPOFOL (N/A ) BIOPSY POLYPECTOMY     Patient location during evaluation: PACU Anesthesia Type: MAC Level of consciousness: awake and alert Pain management: pain level controlled Vital Signs Assessment: post-procedure vital signs reviewed and stable Respiratory status: spontaneous breathing, nonlabored ventilation and respiratory function stable Cardiovascular status: blood pressure returned to baseline and stable Postop Assessment: no apparent nausea or vomiting Anesthetic complications: no    Last Vitals:  Vitals:   03/03/20 1055 03/03/20 1058  BP:  131/75  Pulse: (!) 58 60  Resp:  15  Temp:    SpO2:  98%    Last Pain:  Vitals:   03/03/20 1058  TempSrc:   PainSc: 0-No pain                 Pervis Hocking

## 2020-03-03 NOTE — Anesthesia Preprocedure Evaluation (Addendum)
Anesthesia Evaluation  Patient identified by MRN, date of birth, ID band Patient awake    Reviewed: Allergy & Precautions, H&P , NPO status , Patient's Chart, lab work & pertinent test results, reviewed documented beta blocker date and time   Airway Mallampati: II  TM Distance: >3 FB Neck ROM: Full    Dental no notable dental hx. (+) Dental Advisory Given, Poor Dentition,    Pulmonary sleep apnea and Continuous Positive Airway Pressure Ventilation , Current Smoker and Patient abstained from smoking.,  20 pack year history, 1/2 ppd   Pulmonary exam normal breath sounds clear to auscultation       Cardiovascular hypertension, Pt. on medications and Pt. on home beta blockers + CAD, + Past MI (2015), + Cardiac Stents (stents 2008, 2011), + CABG (2015), + Peripheral Vascular Disease and +CHF   Rhythm:Regular Rate:Normal  HLD  Last echo 04/2019: 1. The left ventricle has mild-moderately reduced systolic function, with  an ejection fraction of 40-45%. The cavity size was normal. There is  mildly increased left ventricular wall thickness. Left ventricular  diastolic Doppler parameters are consistent  with pseudonormalization. Regional wall motion abnormalities are present  (see findings).  2. The right ventricle has normal systolic function. The cavity was  normal. There is no increase in right ventricular wall thickness.  3. The aortic valve is tricuspid. No stenosis of the aortic valve.  4. Left atrial size was mildly dilated.  5. The aortic root and ascending aorta are normal in size and structure  when indexed to body surface area.  6. The inferior vena cava was normal in size with <50% respiratory  variability.  7. No intracardiac thrombi or masses were visualized.  8. When compared to the prior study: 03/18/2016 - no significant change  has occured. Side by side comparison of images performed.    Neuro/Psych Some  residual balance issues  CVA, Residual Symptoms negative psych ROS   GI/Hepatic Neg liver ROS, GERD  Controlled and Medicated,Rectal bleeding    Endo/Other  negative endocrine ROS  Renal/GU negative Renal ROS  negative genitourinary   Musculoskeletal  (+) Arthritis , Osteoarthritis,    Abdominal (+) + obese,   Peds  Hematology negative hematology ROS (+)   Anesthesia Other Findings   Reproductive/Obstetrics negative OB ROS                           Anesthesia Physical Anesthesia Plan  ASA: III  Anesthesia Plan: MAC   Post-op Pain Management:    Induction:   PONV Risk Score and Plan: 2 and Propofol infusion and TIVA  Airway Management Planned: Natural Airway and Simple Face Mask  Additional Equipment: None  Intra-op Plan:   Post-operative Plan:   Informed Consent: I have reviewed the patients History and Physical, chart, labs and discussed the procedure including the risks, benefits and alternatives for the proposed anesthesia with the patient or authorized representative who has indicated his/her understanding and acceptance.       Plan Discussed with: CRNA  Anesthesia Plan Comments:         Anesthesia Quick Evaluation

## 2020-03-03 NOTE — Op Note (Signed)
[facility] Patient Name: [name] Procedure Date: 03/03/2020 MRN: [id number] Attending MD: Otis Brace , MD Date of Birth: 1956-03-25 CSN: [id number] Age: 64 Admit Type: Outpatient Procedure:                Upper GI endoscopy Indications:              Heartburn Providers:                Otis Brace, MD, Debara Pickett., Technician, Rosario Adie, CRNA Referring MD:              Medicines:                Sedation Administered by an Anesthesia Professional Complications:            No immediate complications. Estimated Blood Loss:     Estimated blood loss was minimal. Procedure:                Pre-Anesthesia Assessment:                           - Prior to the procedure, a History and Physical                            was performed, and patient medications and                            allergies were reviewed. The patient's tolerance of                            previous anesthesia was also reviewed. The risks                            and benefits of the procedure and the sedation                            options and risks were discussed with the patient.                            All questions were answered, and informed consent                            was obtained. Prior Anticoagulants: The patient has                            taken no previous anticoagulant or antiplatelet                            agents except for aspirin. ASA Grade Assessment:                            III - A patient with severe systemic disease. After  reviewing the risks and benefits, the patient was                            deemed in satisfactory condition to undergo the                            procedure.                           After obtaining informed consent, the endoscope was                            passed under direct vision. Throughout the   procedure, the patient's blood pressure, pulse, and                            oxygen saturations were monitored continuously. The                            [id number]-H190 [id number]) was introduced through the                            mouth, and advanced to the second part of duodenum.                            The upper GI endoscopy was accomplished without                            difficulty. The patient tolerated the procedure                            well. Scope In: Scope Out: Findings:      The Z-line was regular and was found 35 cm from the incisors.      A small hiatal hernia was present.      Localized mild inflammation characterized by congestion (edema),       erosions and erythema was found in the gastric body. Biopsies were taken       with a cold forceps for histology.      The cardia and gastric fundus were normal on retroflexion.      The duodenal bulb, first portion of the duodenum and second portion of       the duodenum were normal. Impression:               - Z-line regular, 35 cm from the incisors.                           - Small hiatal hernia.                           - Chronic gastritis. Biopsied.                           - Normal duodenal bulb, first portion of the  duodenum and second portion of the duodenum. Moderate Sedation:      Moderate (conscious) sedation was personally administered by an       anesthesia professional. The following parameters were monitored: oxygen       saturation, heart rate, blood pressure, and response to care. Recommendation:           - Perform a colonoscopy today. Procedure Code(s):        --- Professional ---                           [phone], Esophagogastroduodenoscopy, flexible,                            transoral; with biopsy, single or multiple Diagnosis Code(s):        --- Professional ---                           K44.9, Diaphragmatic hernia without obstruction or                             gangrene                           K29.50, Unspecified chronic gastritis without                            bleeding                           R12, Heartburn CPT copyright 2019 American Medical Association. All rights reserved. The codes documented in this report are preliminary and upon coder review may  be revised to meet current compliance requirements. Otis Brace, MD Otis Brace, MD 03/03/2020 10:29:45 AM Number of Addenda: 0

## 2020-03-03 NOTE — H&P (Signed)
Primary Care Physician:  Merwyn Katos Primary Gastroenterologist:  Dr. Alessandra Bevels  Reason for Visit : Rectal bleeding, acid reflux  HPI: [name] is a 64 y.o. male was seen in the office for evaluation of rectal bleeding and acid reflux.  Intermittent bleeding for several months mostly after bowel movements.  Last colonoscopy around 10 years ago.  No family history of colon cancer.  Acid reflux symptoms are well controlled at this time.  Denies any dysphagia or odynophagia.  Denies any acute abdominal pain.  CT abdomen pelvis with contrast negative in March 2020.  Past Medical History:  Diagnosis Date  . Arthritis   . CAD (coronary artery disease)    a. s/p prior MI; hx of stent to RCA and LAD;  b. LHC 12/2006:  LM 20%, pLAD 40%, mLAD stent ok, apical LAD 90% (1.5-60mm vessel), oD1 40-50%, AV groove CFX 30%, oOM1 30%, pRCA 30% (multiple), mRCA stent ok with 30-40% ISR, dRCA 30%, dAnt, inf-apical severe HK, EF 40% => med Rx.;  12/2013 Cath: LM nl, LAD 95ost, 60m ISR, 99apex, LCX nl, RCA 40p, 100 ISR, L->R collats, EF 35-40%-->pending CABG  . Chronic back pain    lumbar compression fracture and stenosis  . GERD (gastroesophageal reflux disease)    occ  . Hypertension    takes Losartan,HCTZ,  and Metoprolol daily  . Ischemic cardiomyopathy    a. Echo 09/2012:  Mod LVH, focal basal hypertrophy, EF 30-35%, mid to dist inf-lat HK, Gr 1 diast dysfn, mild to mod LAE  . Mixed hyperlipidemia    takes Atorvastatin daily  . Myocardial infarction ([place]) 2015   x2  . Peripheral vascular disease ([place])   . Sleep apnea    not using cpap at present had new test and getting new cpap  . Stroke ([place]) 03/2016  . Urinary frequency    takes Flomax daily    Past Surgical History:  Procedure Laterality Date  . ANTERIOR CERVICAL DECOMP/DISCECTOMY FUSION N/A 06/14/2016   Procedure: ANTERIOR CERVICAL DECOMPRESSION/DISCECTOMY FUSION C4 - C5, CERVICAL 6-C7 2 LEVEL;  Surgeon: Melina Schools, MD;  Location: [facility];  Service: Orthopedics;  Laterality: N/A;  . CARDIAC CATHETERIZATION  06/27/2001; 12/13/2006; 02/20/2010  . COLONOSCOPY    . CORONARY ANGIOPLASTY WITH STENT PLACEMENT  05/22/2001   PCI - RCA - LAD  . CORONARY ANGIOPLASTY WITH STENT PLACEMENT  11/11/2006   PCI - BMS - LAD  . CORONARY ANGIOPLASTY WITH STENT PLACEMENT  11/15/2006   PCI - BMS - RCA  . CORONARY ANGIOPLASTY WITH STENT PLACEMENT  03/16/2010  . CORONARY ARTERY BYPASS GRAFT N/A 01/01/2014   Procedure: Coronary artery bypass graft times two using left internal mammary artery and left leg greater saphenous vein harvested endoscopically.;  Surgeon: Gaye Pollack, MD;  Location: [facility] OR;  Service: Open Heart Surgery;  Laterality: N/A;  . EP IMPLANTABLE DEVICE N/A 04/05/2016   Procedure: Loop Recorder Insertion;  Surgeon: Evans Lance, MD;  Location: [facility];  Service: Cardiovascular;  Laterality: N/A;  . FOOT SURGERY Right    achilles tendon reattached  . HAND SURGERY Right    screws  . INTRAOPERATIVE TRANSESOPHAGEAL ECHOCARDIOGRAM N/A 01/01/2014   Procedure: INTRAOPERATIVE TRANSESOPHAGEAL ECHOCARDIOGRAM;  Surgeon: Gaye Pollack, MD;  Location[facility];  Service: Open Heart Surgery;  Laterality: N/A;  . KNEE ARTHROSCOPY     left knee x 1, right knee x 2  . KYPHOPLASTY N/A 12/06/2016   Procedure: KYPHOPLASTY L4 ;  Surgeon: Melina Schools, MD;  Location: [facility];  Service: Orthopedics;  Laterality: N/A;  Requests for 3 hours  . LEFT HEART CATHETERIZATION WITH CORONARY ANGIOGRAM N/A 12/21/2013   Procedure: LEFT HEART CATHETERIZATION WITH CORONARY ANGIOGRAM;  Surgeon: Burnell Blanks, MD;  Location: [facility] CATH LAB;  Service: Cardiovascular;  Laterality: N/A;  . LUMBAR LAMINECTOMY/DECOMPRESSION MICRODISCECTOMY Right 12/06/2016   Procedure: L4-S1 decompression right side ;  Surgeon: Melina Schools, MD;  Location: [facility];  Service: Orthopedics;  Laterality: Right;  . OPEN REDUCTION INTERNAL FIXATION (ORIF) SCAPHOID  WITH ILIAC CREST BONE GRAFT  10/15/2012   Procedure: OPEN REDUCTION INTERNAL FIXATION (ORIF) SCAPHOID WITH ILIAC CREST BONE GRAFT;  Surgeon: Schuyler Amor, MD;  Location: [facility];  Service: Orthopedics;  Laterality: Right;  No iliac creast bone graft taken, used graft from radius    Prior to Admission medications   Medication Sig Start Date End Date Taking? Authorizing Provider  aspirin (ASPIRIN LOW DOSE) 81 MG EC tablet Take 1 tablet (81 mg total) by mouth daily. Please make overdue appt with Dr. Johnsie Cancel before anymore refills. 2nd attempt 01/28/20  Yes Josue Hector, MD  atorvastatin (LIPITOR) 80 MG tablet Take 1 tablet (80 mg total) by mouth daily. Please make overdue appt with Dr. Johnsie Cancel before anymore refills. 2nd attempt Patient taking differently: Take 80 mg by mouth daily.  01/28/20  Yes Josue Hector, MD  fenofibrate (TRICOR) 145 MG tablet TAKE 1 TABLET EVERY DAY PLEASE MAKE YEARLY APPT WITH DR. Johnsie Cancel FOR MARCH BEFORE ANYMORE REFILLS. 1ST ATTEMPT 02/25/20  Yes Josue Hector, MD  hydrochlorothiazide (HYDRODIURIL) 12.5 MG tablet Take 12.5 mg by mouth daily.   Yes [provider]  losartan (COZAAR) 100 MG tablet Take 1 tablet (100 mg total) by mouth daily. Please make overdue appt with Dr. Johnsie Cancel before anymore refills. 2nd attempt Patient taking differently: Take 100 mg by mouth daily.  01/28/20  Yes Josue Hector, MD  metoprolol tartrate (LOPRESSOR) 25 MG tablet Take 1 tablet (25 mg total) by mouth daily. Please make overdue appt with Dr. Johnsie Cancel before anymore refills. 2nd attempt 01/28/20  Yes Josue Hector, MD  nitroGLYCERIN (NITROSTAT) 0.4 MG SL tablet Place 1 tablet (0.4 mg total) under the tongue every 5 (five) minutes as needed for chest pain. 03/19/17  Yes Josue Hector, MD  Omega-3 Fatty Acids (FISH OIL) 1200 MG CAPS Take 1,200 mg by mouth daily.   Yes [provider]  omeprazole (PRILOSEC) 40 MG capsule Take 40 mg by mouth daily.   Yes  [provider]  tamsulosin (FLOMAX) 0.4 MG CAPS capsule Take 0.4 mg by mouth daily after supper.   Yes [provider]    Scheduled Meds: Continuous Infusions: . sodium chloride    . lactated ringers 10 mL/hr at 03/03/20 0854   PRN Meds:.  Allergies as of 01/12/2020 - Review Complete 08/25/2019  Allergen Reaction Noted  . Hydrocodone Other (See Comments) 03/16/2016    Family History  Problem Relation Age of Onset  . Heart disease Father   . Heart disease Maternal Grandmother   . Heart disease Maternal Grandfather   . Heart disease Paternal Grandmother   . Heart disease Paternal Grandfather     Social History   Socioeconomic History  . Marital status: Significant Other    Spouse name: [name]  . Number of children: 2  . Years of education: 5  . Highest education level: Not on file  Occupational History  .  Occupation: Disabled   Tobacco Use  . Smoking status: Current Every Day Smoker    Packs/day: 0.50    Years: 20.00    Pack years: 10.00    Types: Cigarettes  . Smokeless tobacco: Never Used  . Tobacco comment: quit smoking 3-4 months ago  Substance and Sexual Activity  . Alcohol use: No    Alcohol/week: 0.0 standard drinks    Comment: nothing in 3-4 months  . Drug use: No  . Sexual activity: Not on file  Other Topics Concern  . Not on file  Social History Narrative   Lives with [name]   Caffeine use: Drinks coffee/tea/soda (2 cups coffee/morning)   Social Determinants of Health   Financial Resource Strain:   . Difficulty of Paying Living Expenses:   Food Insecurity:   . Worried About Charity fundraiser in the Last Year:   . Arboriculturist in the Last Year:   Transportation Needs:   . Film/video editor (Medical):   [name] Lack of Transportation (Non-Medical):   Physical Activity:   . Days of Exercise per Week:   . Minutes of Exercise per Session:   Stress:   . Feeling of Stress :   Social Connections:   . Frequency of  Communication with Friends and Family:   . Frequency of Social Gatherings with Friends and Family:   . Attends Religious Services:   . Active Member of Clubs or Organizations:   . Attends Archivist Meetings:   [name] Marital Status:   Intimate Partner Violence:   . Fear of Current or Ex-Partner:   . Emotionally Abused:   [name] Physically Abused:   . Sexually Abused:      Physical Exam: Vital signs: Vitals:   03/03/20 0845  BP: 127/73  Pulse: 65  Resp: 14  Temp: 98 F (36.7 C)  SpO2: 97%     General:   Alert,  Well-developed, well-nourished, pleasant and cooperative in NAD Lungs:  Clear throughout to auscultation.   No wheezes, crackles, or rhonchi. No acute distress. Heart:  Regular,rate and rhythm; no murmurs, clicks, rubs,  or gallops. Abdomen: Soft, nontender, nondistended, bowel sounds present Rectal:  Deferred  GI:  Lab Results: No results for input(s): WBC, HGB, HCT, PLT in the last 72 hours. BMET No results for input(s): NA, K, CL, CO2, GLUCOSE, BUN, CREATININE, CALCIUM in the last 72 hours. LFT No results for input(s): PROT, ALBUMIN, AST, ALT, ALKPHOS, BILITOT, BILIDIR, IBILI in the last 72 hours. PT/INR No results for input(s): LABPROT, INR in the last 72 hours.   Studies/Results: No results found.  Impression/Plan: Rectal bleeding Chronic GERD History of CHF  Recommendations ------------------------- -Proceed with EGD and colonoscopy today.  Risks (bleeding, infection, bowel perforation that could require surgery, sedation-related changes in cardiopulmonary systems), benefits (identification and possible treatment of source of symptoms, exclusion of certain causes of symptoms), and alternatives (watchful waiting, radiographic imaging studies, empiric medical treatment)  were explained to patient in detail and patient wishes to proceed.    LOS: 0 days   Otis Brace  MD, FACP 03/03/2020, 9:34 AM  Contact #  [phone]

## 2020-03-03 NOTE — Transfer of Care (Signed)
Immediate Anesthesia Transfer of Care Note  Patient: [name]  Procedure(s) Performed: ESOPHAGOGASTRODUODENOSCOPY (EGD) WITH PROPOFOL (N/A ) COLONOSCOPY WITH PROPOFOL (N/A ) BIOPSY POLYPECTOMY  Patient Location: PACU  Anesthesia Type:MAC  Level of Consciousness: drowsy, patient cooperative and responds to stimulation  Airway & Oxygen Therapy: Patient Spontanous Breathing and Patient connected to face mask oxygen  Post-op Assessment: Report given to RN and Post -op Vital signs reviewed and stable  Post vital signs: Reviewed and stable  Last Vitals:  Vitals Value Taken Time  BP    Temp    Pulse    Resp    SpO2      Last Pain:  Vitals:   03/03/20 0845  TempSrc: Oral  PainSc: 0-No pain         Complications: No apparent anesthesia complications

## 2020-03-04 ENCOUNTER — Encounter: Payer: Self-pay | Admitting: *Deleted

## 2020-03-04 ENCOUNTER — Other Ambulatory Visit: Payer: Self-pay

## 2020-03-04 LAB — SURGICAL PATHOLOGY

## 2020-04-04 ENCOUNTER — Other Ambulatory Visit: Payer: Self-pay | Admitting: Cardiovascular Disease

## 2020-04-11 ENCOUNTER — Telehealth: Payer: Self-pay | Admitting: Cardiovascular Disease

## 2020-04-11 MED ORDER — METOPROLOL TARTRATE 25 MG PO TABS: 25.0000 mg | ORAL_TABLET | Freq: Every day | ORAL | 0 refills | Status: DC

## 2020-04-11 NOTE — Telephone Encounter (Signed)
*  STAT* If patient is at the pharmacy, call can be transferred to refill team.   1. Which medications need to be refilled? (please list name of each medication and dose if known) new prescription Metoprolol  2. Which pharmacy/location (including street and city if local pharmacy) is medication to be sent to? [facility] [place]  3. Do they need a 30 day or 90 day supply? 30  days and refills

## 2020-04-11 NOTE — Telephone Encounter (Signed)
**Note De-Identified  Obfuscation** [facility] [place] [id number] with no refills. Message to pt on RX that he must keep scheduled appt on 05/12/20 to continue refills.

## 2020-04-12 ENCOUNTER — Telehealth: Payer: Self-pay | Admitting: Cardiovascular Disease

## 2020-04-12 MED ORDER — METOPROLOL TARTRATE 25 MG PO TABS: 25.0000 mg | ORAL_TABLET | Freq: Every day | ORAL | 3 refills | Status: DC

## 2020-04-12 NOTE — Telephone Encounter (Signed)
New Message   Pt is calling and would like for the nurse to call him  He would not say what it was in regards too   Please advise

## 2020-04-12 NOTE — Telephone Encounter (Signed)
Patient stated he was out of his metoprolol and he needed an appointment with Dr. Johnsie Cancel. Made patient an appointment with Dr. Johnsie Cancel and sent in refill for patient's Metoprolol.

## 2020-04-18 ENCOUNTER — Other Ambulatory Visit: Payer: Self-pay | Admitting: Cardiovascular Disease

## 2020-04-25 NOTE — Progress Notes (Signed)
Patient ID: [name], male   DOB: [birth year], 64 y.o.   MRN: [id number]    64 y.o. f/u for lipids and CAD. Previous BMS to RCA and LAD in 2002/2008. Failed and subsequent CABG  CABG 01/01/14  with [name] This included LIMA to LAD and SVG to PDA EF 30-35% by echo  Has not smoked since surgery EF 35-40% by last TEE done 03/15/16 Seen by Gill and CRT/AICD not thought To be needed  TTE 04/21/19 EF 40-45% Discussed [name]  And he seems to be doing fine on his current regimen   May of 2017 had CVA left PICA cerebellum dissection left vertebral. Now off plavix per neurology   Had ILR placed by [name] June 2017 No PAF detected  Has had edema on norvasc and back on lasix. 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  All other systems reviewed and negative  General: BP 130/86   Pulse 75   Ht 5\' 8"  (1.727 m)   Wt 202 lb (91.6 kg)   SpO2 98%   BMI 30.71 kg/m   Affect appropriate Healthy:  appears stated age 29: normal Neck supple with no adenopathy JVP normal no bruits no thyromegaly Lungs clear with no wheezing and good diaphragmatic motion Heart:  S1/S2 no murmur, no rub, gallop or click PMI normal Abdomen: benighn, BS positve, no tenderness, no AAA no bruit.  No HSM or HJR Distal pulses intact with no bruits Mild bilateral edema Neuro non-focal poor balance  Skin warm and dry Poor balance walking with cane     Current Outpatient  Medications  Medication Sig Dispense Refill  . aspirin (ASPIRIN LOW DOSE) 81 MG EC tablet Take 1 tablet (81 mg total) by mouth daily. Please make overdue appt with [name] before anymore refills. 2nd attempt 15 tablet 0  . atorvastatin (LIPITOR) 80 MG tablet Take 1 tablet (80 mg total) by mouth daily. Must keep upcoming appt for further refills 90 tablet 0  . fenofibrate (TRICOR) 145 MG tablet TAKE 1 TABLET EVERY DAY PLEASE MAKE YEARLY APPT WITH [name] FOR MARCH BEFORE ANYMORE REFILLS. 1ST ATTEMPT 30 tablet 0  . hydrochlorothiazide (HYDRODIURIL) 12.5 MG tablet Take 12.5 mg by mouth daily.    [name] losartan (COZAAR) 100 MG tablet Take 1 tablet (100 mg total) by mouth daily. Please make overdue appt with [name] before anymore refills. 2nd attempt (Patient taking differently: Take 100 mg by mouth daily. ) 15 tablet 0  . metoprolol tartrate (LOPRESSOR) 25 MG tablet Take 1 tablet (25 mg total) by mouth daily. 90 tablet 3  . nitroGLYCERIN (NITROSTAT) 0.4 MG SL tablet Place 1 tablet (0.4 mg total) under the tongue every 5 (five) minutes as needed for chest pain. 25 tablet 3  . Omega-3 Fatty Acids (FISH OIL) 1200 MG CAPS Take 1,200 mg by mouth daily.    [name] omeprazole (PRILOSEC) 40 MG capsule Take 40 mg by mouth daily.    [name]  tamsulosin (FLOMAX) 0.4 MG CAPS capsule Take 0.4 mg by mouth daily after supper.     No current facility-administered medications for this visit.    Allergies  Hydrocodone  Electrocardiogram:  01/08/19 SR rate 61 anterolateral T wave changes 04/27/20 SR nonspecific lateral T wave changes   Assessment and Plan  Cerebellar Stroke:   Plavix d/c by neuro thought to be dissection of vertebral. Continue cholesterol meds , ASA and non smoking  CAD: Stable with no angina and good activity level.  Continue lopressor and ASA   DCM  euvolemic functional class one continue current meds TTE 04/21/19 EF 40-45%     Chol: on lipitor 80 mg daily labs to be done next week  HTN: Well  controlled.  Continue current medications and low sodium Dash type diet.    Back Pain:    F/u [name]  Post laminectomy with improvement   Edema:  Persisted despite stopping Norvasc on lasix now improved   Rectal Bleeding:  Per GI [name] multiple polyps removed with hemorrhoids 03/03/20 EGD small hiatal hernia and gastritis On prilosec   Has not seen primary in over a year needs lab work including PSA/CMP/CBC/Lipids  [employer]

## 2020-04-27 ENCOUNTER — Other Ambulatory Visit: Payer: Self-pay

## 2020-04-27 ENCOUNTER — Ambulatory Visit: Payer: Medicare HMO | Admitting: Cardiovascular Disease

## 2020-04-27 ENCOUNTER — Encounter: Payer: Self-pay | Admitting: Cardiovascular Disease

## 2020-04-27 VITALS — BP 130/86 | HR 75 | Ht 68.0 in | Wt 202.0 lb

## 2020-04-27 DIAGNOSIS — I420 Dilated cardiomyopathy: Secondary | ICD-10-CM | POA: Diagnosis not present

## 2020-04-27 DIAGNOSIS — R350 Frequency of micturition: Secondary | ICD-10-CM

## 2020-04-27 DIAGNOSIS — I2510 Atherosclerotic heart disease of native coronary artery without angina pectoris: Secondary | ICD-10-CM | POA: Diagnosis not present

## 2020-04-27 DIAGNOSIS — E785 Hyperlipidemia, unspecified: Secondary | ICD-10-CM

## 2020-04-27 DIAGNOSIS — Z125 Encounter for screening for malignant neoplasm of prostate: Secondary | ICD-10-CM

## 2020-04-27 NOTE — Patient Instructions (Addendum)
Medication Instructions:  *If you need a refill on your cardiac medications before your next appointment, please call your pharmacy*  Lab Work: Your physician recommends that you return for lab work on 04/29/20 for CMET, Lipid panel, PSA, HgbA1c, and CBC  If you have labs (blood work) drawn today and your tests are completely normal, you will receive your results only by: [name] MyChart Message (if you have MyChart) OR . A paper copy in the mail If you have any lab test that is abnormal or we need to change your treatment, we will call you to review the results.  Testing/Procedures: None ordered today.  Follow-Up: At [facility], you and your health needs are our priority.  As part of our continuing mission to provide you with exceptional heart care, we have created designated Provider Care Teams.  These Care Teams include your primary Cardiologist (physician) and Advanced Practice Providers (APPs -  Physician Assistants and Nurse Practitioners) who all work together to provide you with the care you need, when you need it.  We recommend signing up for the patient portal called "MyChart".  Sign up information is provided on this After Visit Summary.  MyChart is used to connect with patients for Virtual Visits (Telemedicine).  Patients are able to view lab/test results, encounter notes, upcoming appointments, etc.  Non-urgent messages can be sent to your provider as well.   To learn more about what you can do with MyChart, go to [email or web address].    Your next appointment:   12 month(s)  The format for your next appointment:   In Person  Provider:   You may see Jenkins Rouge, MD or one of the following Advanced Practice Providers on your designated Care Team:    Truitt Merle, NP  Cecilie Kicks, NP  Kathyrn Drown, NP

## 2020-04-29 ENCOUNTER — Other Ambulatory Visit: Payer: Self-pay

## 2020-04-29 ENCOUNTER — Other Ambulatory Visit: Payer: Medicare HMO | Admitting: *Deleted

## 2020-04-29 DIAGNOSIS — I2510 Atherosclerotic heart disease of native coronary artery without angina pectoris: Secondary | ICD-10-CM

## 2020-04-29 DIAGNOSIS — E785 Hyperlipidemia, unspecified: Secondary | ICD-10-CM

## 2020-04-29 DIAGNOSIS — I420 Dilated cardiomyopathy: Secondary | ICD-10-CM

## 2020-04-29 DIAGNOSIS — Z125 Encounter for screening for malignant neoplasm of prostate: Secondary | ICD-10-CM

## 2020-04-29 DIAGNOSIS — R350 Frequency of micturition: Secondary | ICD-10-CM

## 2020-04-30 LAB — CBC WITH DIFFERENTIAL/PLATELET
Basophils Absolute: 0.1 10*3/uL (ref 0.0–0.2)
Basos: 2 %
EOS (ABSOLUTE): 0.6 10*3/uL — ABNORMAL HIGH (ref 0.0–0.4)
Eos: 7 %
Hematocrit: 47.2 % (ref 37.5–51.0)
Hemoglobin: 14.6 g/dL (ref 13.0–17.7)
Immature Grans (Abs): 0 10*3/uL (ref 0.0–0.1)
Immature Granulocytes: 1 %
Lymphocytes Absolute: 2.3 10*3/uL (ref 0.7–3.1)
Lymphs: 26 %
MCH: 27.7 pg (ref 26.6–33.0)
MCHC: 30.9 g/dL — ABNORMAL LOW (ref 31.5–35.7)
MCV: 90 fL (ref 79–97)
Monocytes Absolute: 0.9 10*3/uL (ref 0.1–0.9)
Monocytes: 11 %
Neutrophils Absolute: 4.6 10*3/uL (ref 1.4–7.0)
Neutrophils: 53 %
Platelets: 195 10*3/uL (ref 150–450)
RBC: 5.27 x10E6/uL (ref 4.14–5.80)
RDW: 12.3 % (ref 11.6–15.4)
WBC: 8.6 10*3/uL (ref 3.4–10.8)

## 2020-04-30 LAB — COMPREHENSIVE METABOLIC PANEL
ALT: 21 IU/L (ref 0–44)
AST: 19 IU/L (ref 0–40)
Albumin/Globulin Ratio: 1.7 (ref 1.2–2.2)
Albumin: 4.2 g/dL (ref 3.8–4.8)
Alkaline Phosphatase: 54 IU/L (ref 48–121)
BUN/Creatinine Ratio: 15 (ref 10–24)
BUN: 17 mg/dL (ref 8–27)
Bilirubin Total: 0.2 mg/dL (ref 0.0–1.2)
CO2: 25 mmol/L (ref 20–29)
Calcium: 9.4 mg/dL (ref 8.6–10.2)
Chloride: 104 mmol/L (ref 96–106)
Creatinine, Ser: 1.13 mg/dL (ref 0.76–1.27)
GFR calc Af Amer: 79 mL/min/{1.73_m2} (ref >59)
GFR calc non Af Amer: 68 mL/min/{1.73_m2} (ref >59)
Globulin, Total: 2.5 g/dL (ref 1.5–4.5)
Glucose: 99 mg/dL (ref 65–99)
Potassium: 4.5 mmol/L (ref 3.5–5.2)
Sodium: 139 mmol/L (ref 134–144)
Total Protein: 6.7 g/dL (ref 6.0–8.5)

## 2020-04-30 LAB — LIPID PANEL
Chol/HDL Ratio: 4.4 ratio (ref 0.0–5.0)
Cholesterol, Total: 158 mg/dL (ref 100–199)
HDL: 36 mg/dL — ABNORMAL LOW (ref >39)
LDL Chol Calc (NIH): 94 mg/dL (ref 0–99)
Triglycerides: 159 mg/dL — ABNORMAL HIGH (ref 0–149)
VLDL Cholesterol Cal: 28 mg/dL (ref 5–40)

## 2020-04-30 LAB — PSA: Prostate Specific Ag, Serum: 1.4 ng/mL (ref 0.0–4.0)

## 2020-04-30 LAB — HEMOGLOBIN A1C
Est. average glucose Bld gHb Est-mCnc: 108 mg/dL
Hgb A1c MFr Bld: 5.4 % (ref 4.8–5.6)

## 2020-05-05 ENCOUNTER — Other Ambulatory Visit: Payer: Self-pay | Admitting: *Deleted

## 2020-05-05 MED ORDER — METOPROLOL TARTRATE 25 MG PO TABS: 25.0000 mg | ORAL_TABLET | Freq: Every day | ORAL | 3 refills | Status: DC

## 2020-05-12 ENCOUNTER — Ambulatory Visit: Payer: Medicare HMO | Admitting: Cardiology

## 2020-05-18 ENCOUNTER — Other Ambulatory Visit: Payer: Self-pay

## 2020-05-18 ENCOUNTER — Ambulatory Visit (INDEPENDENT_AMBULATORY_CARE_PROVIDER_SITE_OTHER): Payer: Medicare HMO | Admitting: Pharmacist

## 2020-05-18 VITALS — BP 118/66 | HR 67

## 2020-05-18 DIAGNOSIS — I2510 Atherosclerotic heart disease of native coronary artery without angina pectoris: Secondary | ICD-10-CM

## 2020-05-18 DIAGNOSIS — E782 Mixed hyperlipidemia: Secondary | ICD-10-CM | POA: Diagnosis not present

## 2020-05-18 MED ORDER — METOPROLOL SUCCINATE ER 25 MG PO TB24
25.0000 mg | ORAL_TABLET | Freq: Every day | ORAL | 3 refills | Status: DC
Start: 2020-05-18 — End: 2021-01-02

## 2020-05-18 MED ORDER — HYDROCHLOROTHIAZIDE 12.5 MG PO TABS: 12.5000 mg | ORAL_TABLET | Freq: Every day | ORAL | 3 refills | Status: DC

## 2020-05-18 MED ORDER — LOSARTAN POTASSIUM 50 MG PO TABS: 50.0000 mg | ORAL_TABLET | Freq: Every day | ORAL | 3 refills | Status: DC

## 2020-05-18 MED ORDER — NITROGLYCERIN 0.4 MG SL SUBL: 0.4000 mg | SUBLINGUAL_TABLET | SUBLINGUAL | 3 refills | Status: AC | PRN

## 2020-05-18 MED ORDER — NEXLIZET 180-10 MG PO TABS: 1.0000 | ORAL_TABLET | Freq: Every day | ORAL | 3 refills | Status: DC

## 2020-05-18 MED ORDER — ICOSAPENT ETHYL 1 G PO CAPS
2.0000 g | ORAL_CAPSULE | Freq: Two times a day (BID) | ORAL | 3 refills | Status: DC
Start: 2020-05-18 — End: 2021-06-05

## 2020-05-18 MED ORDER — ASPIRIN 81 MG PO TBEC: 81.0000 mg | DELAYED_RELEASE_TABLET | Freq: Every day | ORAL | 3 refills | Status: DC

## 2020-05-18 MED ORDER — FENOFIBRATE 145 MG PO TABS: ORAL_TABLET | ORAL | 3 refills | Status: DC

## 2020-05-18 NOTE — Progress Notes (Signed)
Patient ID: [name]                 DOB: [birth year]                    MRN: [id number]     HPI: [name] is a 64 y.o. male patient referred to lipid clinic by Dr Johnsie Cancel. PMH is significant for CAD s/p BMS to RCA and LAD in 2002 and 2008, CABG in Feb 2015 with LIMA to LAD and SVG, cerebellar stroke in 2017, HLD, HTN, and tobacco abuse.  Pt presents today in good spirits. Reports tolerating all of his medications well. Brings in a list of home meds - he has been cutting his losartan in half and taking 50mg  daily due to lower BP readings and feeling poorly. His fiance checks his BP regularly and readings have been 120-130/70 since reducing dose to 50mg  daily. His metoprolol tartrate is also being prescribed once daily instead of twice daily.  Current Medications: atorvastatin 80mg  daily, fenofibrate 145mg  daily, OTC fish oil 1200mg  daily Risk Factors: progressive ASCVD s/p stenting and CABG, stroke, tobacco abuse LDL goal: 55mg /dL  Diet: country food, likes stew  Exercise: minimal, plans to start using weight room again  Family History: Heart disease in father and all 4 grandparents  Social History: former tobacco abuse 1/2 PPD for 20 years, denies alcohol and illicit drug use  Labs: 04/29/20: TC 158, TG 159, HDL 36, LDL 94 (atorvastatin 80mg  daily, fenofibrate 145mg  daily, OTC fish oil)  Past Medical History:  Diagnosis Date  . Arthritis   . CAD (coronary artery disease)    a. s/p prior MI; hx of stent to RCA and LAD;  b. LHC 12/2006:  LM 20%, pLAD 40%, mLAD stent ok, apical LAD 90% (1.5-30mm vessel), oD1 40-50%, AV groove CFX 30%, oOM1 30%, pRCA 30% (multiple), mRCA stent ok with 30-40% ISR, dRCA 30%, dAnt, inf-apical severe HK, EF 40% => med Rx.;  12/2013 Cath: LM nl, LAD 95ost, 59m ISR, 99apex, LCX nl, RCA 40p, 100 ISR, L->R collats, EF 35-40%-->pending CABG  . Chronic back pain    lumbar compression fracture and stenosis  . GERD (gastroesophageal reflux disease)    occ    . Hypertension    takes Losartan,HCTZ,  and Metoprolol daily  . Ischemic cardiomyopathy    a. Echo 09/2012:  Mod LVH, focal basal hypertrophy, EF 30-35%, mid to dist inf-lat HK, Gr 1 diast dysfn, mild to mod LAE  . Mixed hyperlipidemia    takes Atorvastatin daily  . Myocardial infarction ([place]) 2015   x2  . Peripheral vascular disease ([place])   . Sleep apnea    not using cpap at present had new test and getting new cpap  . Stroke ([place]) 03/2016  . Urinary frequency    takes Flomax daily    Current Outpatient Medications on File Prior to Visit  Medication Sig Dispense Refill  . aspirin (ASPIRIN LOW DOSE) 81 MG EC tablet Take 1 tablet (81 mg total) by mouth daily. Please make overdue appt with Dr. Johnsie Cancel before anymore refills. 2nd attempt 15 tablet 0  . atorvastatin (LIPITOR) 80 MG tablet Take 1 tablet (80 mg total) by mouth daily. Must keep upcoming appt for further refills 90 tablet 0  . fenofibrate (TRICOR) 145 MG tablet TAKE 1 TABLET EVERY DAY PLEASE MAKE YEARLY APPT WITH DR. Johnsie Cancel FOR MARCH BEFORE ANYMORE REFILLS. 1ST ATTEMPT 30 tablet 0  . hydrochlorothiazide (HYDRODIURIL) 12.5 MG tablet  Take 12.5 mg by mouth daily.    [name] losartan (COZAAR) 100 MG tablet Take 1 tablet (100 mg total) by mouth daily. Please make overdue appt with Dr. Johnsie Cancel before anymore refills. 2nd attempt (Patient taking differently: Take 100 mg by mouth daily. ) 15 tablet 0  . metoprolol tartrate (LOPRESSOR) 25 MG tablet Take 1 tablet (25 mg total) by mouth daily. 90 tablet 3  . nitroGLYCERIN (NITROSTAT) 0.4 MG SL tablet Place 1 tablet (0.4 mg total) under the tongue every 5 (five) minutes as needed for chest pain. 25 tablet 3  . Omega-3 Fatty Acids (FISH OIL) 1200 MG CAPS Take 1,200 mg by mouth daily.    [name] omeprazole (PRILOSEC) 40 MG capsule Take 40 mg by mouth daily.    . tamsulosin (FLOMAX) 0.4 MG CAPS capsule Take 0.4 mg by mouth daily after supper.     No current facility-administered medications on file prior  to visit.    Allergies  Allergen Reactions  . Hydrocodone Other (See Comments)    Throat itchy, weakness- took this during stroke not sure if caused problem    Assessment/Plan:  1. Hyperlipidemia - LDL is 94 on atorvastatin 80mg  daily, above goal < 55 due to progressive ASCVD. Will add on Nexlizet 180-10mg  once daily for additional 40% LDL lowering (pt prefers pill over [id number] injectable therapy). TG 159 on fenofibrate 145mg  daily and OTC fish oil above goal < 150. Will change OTC fish oil to Vascepa 2g BID for CV benefit as well as better TG lowering. Pt approved for [employer] for $0 copays on both new meds, Nexlizet PA approved, grant info provided to pharmacy. Encouraged pt to increase physical activity as able. Will recheck labs in 3 months to assess efficacy.  2. Med management - New rx sent in for losartan 50mg  once daily since pt has been cutting 100mg  tablets in half due to hypotension and feeling poorly. BP remains at goal on lower dose. Pt was also being prescribed metoprolol tartrate once daily instead of twice daily. Changed this to metoprolol succinate 25mg  once daily instead for easier dosing and more optimal coverage throughout the day. Multiple cardiac meds refilled as most recent rx's were for a 15-30 day supply prescribed 4 months ago.  Patte Winkel E. Falynn Ailey, PharmD, BCACP, [place] [address], [place], [place] [address] Phone: [phone]; Fax: [phone] 05/18/2020 9:13 AM

## 2020-05-18 NOTE — Patient Instructions (Addendum)
I sent in a new refill for losartan 50mg  daily - take 1 full tablet daily once you are finished cutting your 100mg  tablets in half  I changed your metoprolol formulation to the 24 hour longer acting version. You'll still take 1 tablet of metoprolol once a day but it will work a bit longer in your body  Cholesterol: -Your LDL (bad cholesterol) is 94 and your goal is less than 55. - We will start Nexlizet 1 pill once a day. This will lower your LDL an extra 40% - Continue taking atorvastatin (Lipitor) 80mg  once daily  -Your triglycerides ares 159 and your goal is less than 150. -We will change your over the counter fish oil to Vascepa 2 capsules twice daily. This is a prescription fish oil that lowers your triglycerides by 30% and helps to prevent heart attacks and strokes -Continue taking fenofibrate 145mg  daily  I will apply for a grant through the [facility] to make your Nexlizet and Vascepa free  Recheck fasting cholesterol on Wednesday, October 13th any time after 7:30am

## 2020-06-08 ENCOUNTER — Telehealth: Payer: Self-pay | Admitting: Pharmacist

## 2020-06-08 NOTE — Telephone Encounter (Signed)
Patient called with questions regarding new cholesterol medications.  Advised that his Vascepa will take the place of his OTC fish oil and the Nexlizet is in addition to his current medications atorvastatin and fenofibrate.  Patient voiced understanding.

## 2020-06-15 ENCOUNTER — Other Ambulatory Visit: Payer: Self-pay | Admitting: Cardiovascular Disease

## 2020-06-24 ENCOUNTER — Other Ambulatory Visit: Payer: Self-pay | Admitting: Cardiovascular Disease

## 2020-08-17 ENCOUNTER — Other Ambulatory Visit: Payer: Medicare HMO

## 2020-08-22 ENCOUNTER — Other Ambulatory Visit: Payer: Self-pay | Admitting: Cardiovascular Disease

## 2020-08-31 ENCOUNTER — Other Ambulatory Visit: Payer: Self-pay

## 2020-08-31 ENCOUNTER — Other Ambulatory Visit: Payer: Medicare HMO | Admitting: *Deleted

## 2020-08-31 DIAGNOSIS — E782 Mixed hyperlipidemia: Secondary | ICD-10-CM

## 2020-08-31 DIAGNOSIS — I2510 Atherosclerotic heart disease of native coronary artery without angina pectoris: Secondary | ICD-10-CM

## 2020-08-31 LAB — COMPREHENSIVE METABOLIC PANEL
ALT: 32 IU/L (ref 0–44)
AST: 31 IU/L (ref 0–40)
Albumin/Globulin Ratio: 1.7 (ref 1.2–2.2)
Albumin: 4.6 g/dL (ref 3.8–4.8)
Alkaline Phosphatase: 51 IU/L (ref 44–121)
BUN/Creatinine Ratio: 23 (ref 10–24)
BUN: 25 mg/dL (ref 8–27)
Bilirubin Total: 0.5 mg/dL (ref 0.0–1.2)
CO2: 24 mmol/L (ref 20–29)
Calcium: 9.9 mg/dL (ref 8.6–10.2)
Chloride: 99 mmol/L (ref 96–106)
Creatinine, Ser: 1.08 mg/dL (ref 0.76–1.27)
GFR calc Af Amer: 83 mL/min/{1.73_m2} (ref >59)
GFR calc non Af Amer: 72 mL/min/{1.73_m2} (ref >59)
Globulin, Total: 2.7 g/dL (ref 1.5–4.5)
Glucose: 100 mg/dL — ABNORMAL HIGH (ref 65–99)
Potassium: 4.2 mmol/L (ref 3.5–5.2)
Sodium: 138 mmol/L (ref 134–144)
Total Protein: 7.3 g/dL (ref 6.0–8.5)

## 2020-08-31 LAB — LIPID PANEL
Chol/HDL Ratio: 4.6 ratio (ref 0.0–5.0)
Cholesterol, Total: 123 mg/dL (ref 100–199)
HDL: 27 mg/dL — ABNORMAL LOW (ref >39)
LDL Chol Calc (NIH): 53 mg/dL (ref 0–99)
Triglycerides: 271 mg/dL — ABNORMAL HIGH (ref 0–149)
VLDL Cholesterol Cal: 43 mg/dL — ABNORMAL HIGH (ref 5–40)

## 2020-10-27 ENCOUNTER — Telehealth: Payer: Self-pay | Admitting: Cardiovascular Disease

## 2020-10-27 NOTE — Telephone Encounter (Signed)
Pt is enrolled in [employer] for his [place]. This is not about to expire, it's active until 04/17/21. He did need new prior authorization for Nexlizet which has been submitted and approved immediately through 11/04/21. Called pt who is aware and was appreciative for assistance.

## 2020-10-27 NOTE — Telephone Encounter (Signed)
Pt called in and state he receives a grant from [employer] for his med and it is about to expire.  He would like to know what he needs to do to renew it   Best number [phone]

## 2020-12-28 ENCOUNTER — Telehealth: Payer: Self-pay | Admitting: Pharmacist

## 2020-12-28 NOTE — Telephone Encounter (Signed)
Pt changed insurance plans from [facility] to [employer], now needs another reauthorization for [employer]. This has been submitted.

## 2020-12-28 NOTE — Telephone Encounter (Signed)
Prior authorization was denied because the clinical staff incorrectly reviewed our request and want pt to try another statin and ezetimibe when he already takes atorvastatin 80mg  daily and ezetimibe that's contained in [place]. Appeals letter has been submitted.

## 2020-12-31 ENCOUNTER — Other Ambulatory Visit: Payer: Self-pay | Admitting: Cardiovascular Disease

## 2021-01-02 MED ORDER — NEXLIZET 180-10 MG PO TABS
1.0000 | ORAL_TABLET | Freq: Every day | ORAL | 3 refills | Status: DC
Start: 2021-01-02 — End: 2021-06-15

## 2021-01-02 NOTE — Telephone Encounter (Addendum)
Called pt's [employer] insurance [phone] and was advised by voice recording that pt's Nexlizet appeals was approved on 12/30/20 through 11/04/21. Refill sent to pt's pharmacy. Called pt and made him aware.

## 2021-02-06 ENCOUNTER — Telehealth: Payer: Self-pay | Admitting: Cardiovascular Disease

## 2021-02-06 NOTE — Telephone Encounter (Signed)
Pt c/o BP issue: STAT if pt c/o blurred vision, one-sided weakness or slurred speech  1. What are your last 5 BP readings? last night 87/57  2. Are you having any other symptoms (ex. Dizziness, headache, blurred vision, passed out)? Tired some blood in his urine  3. What is your BP issue? BP runinng low patient would like to speak with a nurse and a apt.

## 2021-02-06 NOTE — Telephone Encounter (Signed)
OK- urology referral appropriate only on 81 mg ASA for CABG

## 2021-02-06 NOTE — Telephone Encounter (Signed)
Called patient back about message. Patient having issues with blood in his urine and his BP was low last night at 87/57 at rest. Patient stated he saw his PCP today and he is feeling fine with BP of 136/82. PCP has referred patient to Urology. Informed patient that Dr. Johnsie Cancel would be updated and if he has any advisement at this time, we will let him know. Encouraged patient to see urology about blood in urine and to drink fluids if his BP is low again and see if that helps. Will forward to Dr. Johnsie Cancel for advisement.

## 2021-02-06 NOTE — Telephone Encounter (Signed)
Left message for the pt to call back.

## 2021-02-06 NOTE — Telephone Encounter (Signed)
Patient returned call and scheduled an appointment for 06/02/21 with Dr. Johnsie Cancel.

## 2021-02-09 ENCOUNTER — Telehealth: Payer: Self-pay | Admitting: Neurology

## 2021-02-09 ENCOUNTER — Encounter: Payer: Self-pay | Admitting: Neurology

## 2021-02-09 ENCOUNTER — Ambulatory Visit: Payer: Medicare Other | Admitting: Neurology

## 2021-02-09 ENCOUNTER — Other Ambulatory Visit: Payer: Self-pay

## 2021-02-09 VITALS — BP 120/77 | HR 74 | Ht 68.0 in | Wt 209.0 lb

## 2021-02-09 DIAGNOSIS — R270 Ataxia, unspecified: Secondary | ICD-10-CM

## 2021-02-09 DIAGNOSIS — R258 Other abnormal involuntary movements: Secondary | ICD-10-CM

## 2021-02-09 DIAGNOSIS — R252 Cramp and spasm: Secondary | ICD-10-CM

## 2021-02-09 DIAGNOSIS — I69398 Other sequelae of cerebral infarction: Secondary | ICD-10-CM

## 2021-02-09 DIAGNOSIS — G801 Spastic diplegic cerebral palsy: Secondary | ICD-10-CM

## 2021-02-09 DIAGNOSIS — G8220 Paraplegia, unspecified: Secondary | ICD-10-CM

## 2021-02-09 MED ORDER — BACLOFEN 5 MG PO TABS
5.0000 mg | ORAL_TABLET | Freq: Three times a day (TID) | ORAL | 6 refills | Status: DC | PRN
Start: 2021-02-09 — End: 2021-02-14

## 2021-02-09 NOTE — Telephone Encounter (Signed)
[employer] medicare order sent to GI. No auth they will reach out to the patient to schedule.  

## 2021-02-09 NOTE — Patient Instructions (Addendum)
Physical therapy  Clonus/spasticity: Baclofen 3x a day as needed We will send to rehab physician for evaluation of botox and treatment of spasticity MRI cervical     Spasticity Spasticity is a condition in which your muscles contract suddenly and unpredictably (spasm). Spasticity usually affects your arms, legs, or back. It can also affect the way you walk. Spasticity can range from mild muscle stiffness and tightness to severe, uncontrollable muscle spasms. Severe spasticity can be painful and can freeze your muscles in an uncomfortable position. Follow these instructions at home: Managing muscle stiffness and spasms  Wear a brace as told by your health care provider to prevent muscle contractions.  Have the affected muscles massaged.  If directed, apply heat to the affected muscle area. Use the heat source that your health care provider recommends, such as a moist heat pack or heating pad. ? Place a towel between your skin and the heat source. ? Leave the heat on for 20-30 minutes. ? Remove the head if your skin turns bright red. This is especially important if you are unable to feel pain, heat, or cold. You may have a greater risk of getting burned.  If directed, apply ice to the affected muscle area: ? Put ice in a plastic bag. ? Place a towel between your skin and the bag or between your brace and the bag. ? Leave the ice on for 20 minutes, 2?3 times a day.      Activity  Stay active as directed by your health care provider. Find a safe exercise program that fits your needs and ability.  Maintain good posture when walking and sitting.  Work with a physical therapist to learn exercises that will stretch and strengthen your muscles.  Do stretching and range of motion exercises at home as told by a physical therapist.  Work with an occupational therapist. This type of health care provider can help you function better at home and at work.  If you have severe spasticity, use  mobility aids, such as a walker or cane, as told by your health care provider. General instructions  Watch your condition for any changes.  Wear loose, comfortable clothing that does not restrict your movement.  Wear closed-toe shoes that fit well and support your feet. Wear shoes that have rubber soles or low heels.  Keep all follow-up visits as told by your health care provider. This is important.  Take over-the-counter and prescription medicine only as told by your health care provider. Contact a health care provider if you:  Have worsening muscle spasms.  Develop other symptoms along with spasticity.  Have a fever or chills.  Experience a burning feeling when you pass urine.  Become constipated.  Need more support at home. Get help right away if you:  Have trouble breathing.  Have a muscle spasm that freezes you into a painful position.  Cannot walk.  Cannot care for yourself at home.  Have trouble passing urine or have urinary incontinence. Summary  Spasticity is a condition in which your muscles contract suddenly and unpredictably (spasm). Spasticity usually affects your arms, legs, or back.  Spasticity can range from mild muscle stiffness and tightness to severe, uncontrollable muscle spasms.  Do stretching and range of motion exercises at home as told by a physical therapist.  Take over-the-counter and prescription medicine only as told by your health care provider. This information is not intended to replace advice given to you by your health care provider. Make sure you discuss any  questions you have with your health care provider. Document Revised: 11/19/2017 Document Reviewed: 11/19/2017 Elsevier Patient Education  2021 [place].  Baclofen tablets What is this medicine? BACLOFEN (BAK loe fen) helps relieve spasms and cramping of muscles. It may be used to treat symptoms of multiple sclerosis or spinal cord injury. This medicine may be used for  other purposes; ask your health care provider or pharmacist if you have questions. COMMON BRAND NAME(S): ED Baclofen, Lioresal What should I tell my health care provider before I take this medicine? They need to know if you have any of these conditions:  kidney disease  seizures  stroke  an unusual or allergic reaction to baclofen, other medicines, foods, dyes, or preservatives  pregnant or trying to get pregnant  breast-feeding How should I use this medicine? Take this medicine by mouth. Swallow it with a drink of water. Follow the directions on the prescription label. Do not take more medicine than you are told to take. Talk to your pediatrician regarding the use of this medicine in children. Special care may be needed. Overdosage: If you think you have taken too much of this medicine contact a poison control center or emergency room at once. NOTE: This medicine is only for you. Do not share this medicine with others. What if I miss a dose? If you miss a dose, take it as soon as you can. If it is almost time for your next dose, take only that dose. Do not take double or extra doses. What may interact with this medicine? Do not take this medication with any of the following medicines:  narcotic medicines for cough This medicine may also interact with the following medications:  alcohol  antihistamines for allergy, cough and cold  certain medicines for anxiety or sleep  certain medicines for depression like amitriptyline, fluoxetine, sertraline  certain medicines for seizures like phenobarbital, primidone  general anesthetics like halothane, isoflurane, methoxyflurane, propofol  local anesthetics like lidocaine, pramoxine, tetracaine  medicines that relax muscles for surgery  narcotic medicines for pain  phenothiazines like chlorpromazine, mesoridazine, prochlorperazine, thioridazine This list may not describe all possible interactions. Give your health care provider a  list of all the medicines, herbs, non-prescription drugs, or dietary supplements you use. Also tell them if you smoke, drink alcohol, or use illegal drugs. Some items may interact with your medicine. What should I watch for while using this medicine? Tell your doctor or health care professional if your symptoms do not start to get better or if they get worse. Do not suddenly stop taking your medicine. If you do, you may develop a severe reaction. If your doctor wants you to stop the medicine, the dose will be slowly lowered over time to avoid any side effects. Follow the advice of your doctor. You may get drowsy or dizzy. Do not drive, use machinery, or do anything that needs mental alertness until you know how this medicine affects you. Do not stand or sit up quickly, especially if you are an older patient. This reduces the risk of dizzy or fainting spells. Alcohol may interfere with the effect of this medicine. Avoid alcoholic drinks. If you are taking another medicine that also causes drowsiness, you may have more side effects. Give your health care provider a list of all medicines you use. Your doctor will tell you how much medicine to take. Do not take more medicine than directed. Call emergency for help if you have problems breathing or unusual sleepiness. What side effects  may I notice from receiving this medicine? Side effects that you should report to your doctor or health care professional as soon as possible:  allergic reactions like skin rash, itching or hives, swelling of the face, lips, or tongue  breathing problems  changes in emotions or moods  changes in vision  chest pain  fast, irregular heartbeat  feeling faint or lightheaded, falls  hallucinations  loss of balance or coordination  ringing of the ears  seizures  trouble passing urine or change in the amount of urine  trouble walking  unusually weak or tired Side effects that usually do not require medical  attention (report to your doctor or health care professional if they continue or are bothersome):  changes in taste  confusion  constipation  diarrhea  dry mouth  headache  muscle weakness  nausea, vomiting  trouble sleeping This list may not describe all possible side effects. Call your doctor for medical advice about side effects. You may report side effects to FDA at 1-800-FDA-1088. Where should I keep my medicine? Keep out of the reach of children. Store at room temperature between 15 and 30 degrees C (59 and 86 degrees F). Keep container tightly closed. Throw away any unused medicine after the expiration date. NOTE: This sheet is a summary. It may not cover all possible information. If you have questions about this medicine, talk to your doctor, pharmacist, or health care provider.  2021 Elsevier/Gold Standard (2017-08-03 09:56:42)
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
## 2021-02-10 ENCOUNTER — Telehealth: Payer: Self-pay | Admitting: Cardiovascular Disease

## 2021-02-10 DIAGNOSIS — I420 Dilated cardiomyopathy: Secondary | ICD-10-CM

## 2021-02-10 DIAGNOSIS — E782 Mixed hyperlipidemia: Secondary | ICD-10-CM

## 2021-02-10 DIAGNOSIS — R9389 Abnormal findings on diagnostic imaging of other specified body structures: Secondary | ICD-10-CM

## 2021-02-10 NOTE — Telephone Encounter (Signed)
    Pt said he had a CT yesterday and his doctor saw ventricular aneurysm, he wanted to ask Dr. Johnsie Cancel if he needs to be seen sooner. Pt said his doctor will send a copy of his CT result. He wanted to speak with Dr.. Kyla Balzarine nurse as well

## 2021-02-10 NOTE — Telephone Encounter (Signed)
Called patient back with Dr. Kyla Balzarine recommendation. Patient agreed to plan. Will send message to scheduling to help get patient in for echo.

## 2021-02-10 NOTE — Telephone Encounter (Signed)
Echo from June 2020 shows known old infarct with akinetic apex. I suspect that is all they saw Can have him get echo next week with definity to make sure nothing changed

## 2021-02-10 NOTE — Telephone Encounter (Signed)
Called patient back. Patient stated he had a CT done yesterday with a facility with [facility] for his issue with blood in his urine. Patient stated they found a ventricular aneurysm or something. Patient is wondering if he needs to be seen sooner for this and should he be concerned. Informed patient that we would need to see CT. Gave patient fax number to send report. Will see if we can find CT in care everywhere.

## 2021-02-14 ENCOUNTER — Other Ambulatory Visit: Payer: Self-pay

## 2021-02-14 ENCOUNTER — Encounter: Payer: Self-pay | Admitting: Physical Medicine & Rehabilitation

## 2021-02-14 ENCOUNTER — Encounter: Payer: Medicare Other | Attending: Physical Medicine & Rehabilitation | Admitting: Physical Medicine & Rehabilitation

## 2021-02-14 VITALS — BP 110/62 | HR 72 | Temp 97.8°F | Ht 68.0 in | Wt 203.0 lb

## 2021-02-14 DIAGNOSIS — M545 Low back pain, unspecified: Secondary | ICD-10-CM | POA: Diagnosis present

## 2021-02-14 DIAGNOSIS — M47817 Spondylosis without myelopathy or radiculopathy, lumbosacral region: Secondary | ICD-10-CM | POA: Insufficient documentation

## 2021-02-14 DIAGNOSIS — G8929 Other chronic pain: Secondary | ICD-10-CM | POA: Insufficient documentation

## 2021-02-14 DIAGNOSIS — M7918 Myalgia, other site: Secondary | ICD-10-CM | POA: Diagnosis present

## 2021-02-14 DIAGNOSIS — G801 Spastic diplegic cerebral palsy: Secondary | ICD-10-CM | POA: Insufficient documentation

## 2021-02-14 MED ORDER — TIZANIDINE HCL 4 MG PO TABS: 4.0000 mg | ORAL_TABLET | Freq: Three times a day (TID) | ORAL | 1 refills | Status: DC | PRN

## 2021-02-14 NOTE — Progress Notes (Signed)
Subjective:    Patient ID: [name], male    DOB: 04-27-1956, 65 y.o.   MRN: [id number]  HPI CC: Left leg shaking  65 year old male with history of left PICA infarct in 2017 who was referred by neurology to evaluate spasticity in the lower extremities.  The patient was seen by Dr. Lavell Anchors on 02/09/2021.  Upper extremity spasticity was noted in addition to the lower extremity spasticity and patient was sent for cervical spine imaging. Patient also notes severe low back pain which started a couple weeks ago.  He has an appointment to see a urologist to evaluate for kidney stones.  His past medical history is significant for L4 compression fracture status post vertebroplasty  Past surgical history C5-6 C6-7 ACDF in 2017 by Dr. Rolena Infante. Other past medical history significant for coronary artery disease, he is a smoker.  In regards to his back he states it hurts more to straighten up when he walks in a forward flexed posture.  He has no pain running down his legs.  In regards to his spasticity it primarily affects left lower extremity and he feels his leg shaking from time to time.  His wife notes that at night while he was sleeping his leg shakes as well. Pain Inventory Average Pain 10 Pain Right Now 10 My pain is constant, sharp and stabbing  In the last 24 hours, has pain interfered with the following? General activity 10 Relation with others 0 Enjoyment of life 10 What TIME of day is your pain at its worst? morning , daytime, evening and night Sleep (in general) Fair  Pain is worse with: walking and standing Pain improves with: Nothing makes it better. Relief from Meds: 0  use a cane use a walker how many minutes can you walk? 2-3 mins ability to climb steps?  yes do you drive?  yes Do you have any goals in this area?  yes  disabled: date disabled 43 ? I need assistance with the following:  household duties and shopping Do you have any goals in this area?   yes  bladder control problems weakness numbness tremor trouble walking spasms  Any changes since last visit?  yes x-rays 02/13/2021   Any changes since last visit?  no New Patient    Family History  Problem Relation Age of Onset  . Heart disease Father   . Heart disease Maternal Grandmother   . Heart disease Maternal Grandfather   . Heart disease Paternal Grandmother   . Heart disease Paternal Grandfather    Social History   Socioeconomic History  . Marital status: Significant Other    Spouse name: [name]  . Number of children: 2  . Years of education: 21  . Highest education level: Not on file  Occupational History  . Occupation: Disabled   Tobacco Use  . Smoking status: Current Every Day Smoker    Packs/day: 0.50    Years: 20.00    Pack years: 10.00    Types: Cigarettes  . Smokeless tobacco: Never Used  . Tobacco comment: quit smoking 3-4 months ago  Vaping Use  . Vaping Use: Never used  Substance and Sexual Activity  . Alcohol use: No    Alcohol/week: 0.0 standard drinks    Comment: none in about 3 years or more   . Drug use: No  . Sexual activity: Not on file  Other Topics Concern  . Not on file  Social History Narrative   Lives with Jackelyn Poling  Caffeine use: Drinks coffee/tea/soda (2 cups coffee/morning) + at least 2 sodas per day    Social Determinants of Health   Financial Resource Strain: Not on file  Food Insecurity: Not on file  Transportation Needs: Not on file  Physical Activity: Not on file  Stress: Not on file  Social Connections: Not on file   Past Surgical History:  Procedure Laterality Date  . ANTERIOR CERVICAL DECOMP/DISCECTOMY FUSION N/A 06/14/2016   Procedure: ANTERIOR CERVICAL DECOMPRESSION/DISCECTOMY FUSION C4 - C5, CERVICAL 6-C7 2 LEVEL;  Surgeon: Melina Schools, MD;  Location: [place];  Service: Orthopedics;  Laterality: N/A;  . BIOPSY  03/03/2020   Procedure: BIOPSY;  Surgeon: Otis Brace, MD;  [facility];   Service: Gastroenterology;;  . CARDIAC CATHETERIZATION  06/27/2001; 12/13/2006; 02/20/2010  . COLONOSCOPY    . COLONOSCOPY WITH PROPOFOL N/A 03/03/2020   Procedure: COLONOSCOPY WITH PROPOFOL;  Surgeon: Otis Brace, MD;  [facility];  Service: Gastroenterology;  Laterality: N/A;  . CORONARY ANGIOPLASTY WITH STENT PLACEMENT  05/22/2001   PCI - RCA - LAD  . CORONARY ANGIOPLASTY WITH STENT PLACEMENT  11/11/2006   PCI - BMS - LAD  . CORONARY ANGIOPLASTY WITH STENT PLACEMENT  11/15/2006   PCI - BMS - RCA  . CORONARY ANGIOPLASTY WITH STENT PLACEMENT  03/16/2010  . CORONARY ARTERY BYPASS GRAFT N/A 01/01/2014   Procedure: Coronary artery bypass graft times two using left internal mammary artery and left leg greater saphenous vein harvested endoscopically.;  Surgeon: Gaye Pollack, MD;  [facility];  Service: Open Heart Surgery;  Laterality: N/A;  . EP IMPLANTABLE DEVICE N/A 04/05/2016   Procedure: Loop Recorder Insertion;  Surgeon: Evans Lance, MD;  Location: [place] [facility];  Service: Cardiovascular;  Laterality: N/A;  . ESOPHAGOGASTRODUODENOSCOPY (EGD) WITH PROPOFOL N/A 03/03/2020   Procedure: ESOPHAGOGASTRODUODENOSCOPY (EGD) WITH PROPOFOL;  Surgeon: Otis Brace, MD;  [facility];  Service: Gastroenterology;  Laterality: N/A;  . FOOT SURGERY Right    achilles tendon reattached  . HAND SURGERY Right    screws  . INTRAOPERATIVE TRANSESOPHAGEAL ECHOCARDIOGRAM N/A 01/01/2014   Procedure: INTRAOPERATIVE TRANSESOPHAGEAL ECHOCARDIOGRAM;  Surgeon: Gaye Pollack, MD;  Location: [facility] OR;  Service: Open Heart Surgery;  Laterality: N/A;  . KNEE ARTHROSCOPY     left knee x 1, right knee x 2  . KYPHOPLASTY N/A 12/06/2016   Procedure: KYPHOPLASTY L4 ;  Surgeon: Melina Schools, MD;  Location: [place];  Service: Orthopedics;  Laterality: N/A;  Requests for 3 hours  . LEFT HEART CATHETERIZATION WITH CORONARY ANGIOGRAM N/A 12/21/2013   Procedure: LEFT HEART CATHETERIZATION WITH CORONARY  ANGIOGRAM;  Surgeon: Burnell Blanks, MD;  Location: [facility];  Service: Cardiovascular;  Laterality: N/A;  . LUMBAR LAMINECTOMY/DECOMPRESSION MICRODISCECTOMY Right 12/06/2016   Procedure: L4-S1 decompression right side ;  Surgeon: Melina Schools, MD;  Location: [place];  Service: Orthopedics;  Laterality: Right;  . OPEN REDUCTION INTERNAL FIXATION (ORIF) SCAPHOID WITH ILIAC CREST BONE GRAFT  10/15/2012   Procedure: OPEN REDUCTION INTERNAL FIXATION (ORIF) SCAPHOID WITH ILIAC CREST BONE GRAFT;  Surgeon: Schuyler Amor, MD;  Location: [place];  Service: Orthopedics;  Laterality: Right;  No iliac creast bone graft taken, used graft from radius  . POLYPECTOMY  03/03/2020   Procedure: POLYPECTOMY;  Surgeon: Otis Brace, MD;  [facility];  Service: Gastroenterology;;   Past Medical History:  Diagnosis Date  . Arthritis   . BPH (benign prostatic hyperplasia)   . CAD (coronary  artery disease)    a. s/p prior MI; hx of stent to RCA and LAD;  b. LHC 12/2006:  LM 20%, pLAD 40%, mLAD stent ok, apical LAD 90% (1.5-31mm vessel), oD1 40-50%, AV groove CFX 30%, oOM1 30%, pRCA 30% (multiple), mRCA stent ok with 30-40% ISR, dRCA 30%, dAnt, inf-apical severe HK, EF 40% => med Rx.;  12/2013 Cath: LM nl, LAD 95ost, 19m ISR, 99apex, LCX nl, RCA 40p, 100 ISR, L->R collats, EF 35-40%-->pending CABG  . Chronic back pain    lumbar compression fracture and stenosis  . Clavicle fracture   . Cryptogenic stroke ([place])   . ED (erectile dysfunction)   . GERD (gastroesophageal reflux disease)    occ  . Hypertension    takes Losartan,HCTZ,  and Metoprolol daily  . Ischemic cardiomyopathy    a. Echo 09/2012:  Mod LVH, focal basal hypertrophy, EF 30-35%, mid to dist inf-lat HK, Gr 1 diast dysfn, mild to mod LAE  . Mixed hyperlipidemia    takes Atorvastatin daily  . Myocardial infarction ([place]) 2015   x2  . Peripheral vascular disease ([place])   . Scaphoid non-union advanced collapse   .  Sleep apnea    not using cpap at present had new test and getting new cpap  . Stroke ([place]) 03/2016  . Urinary frequency    takes Flomax daily  . Vitamin B12 deficiency    BP 110/62   Pulse 72   Temp 97.8 F (36.6 C)   Ht 5\' 8"  (1.727 m)   Wt 203 lb (92.1 kg)   SpO2 95%   BMI 30.87 kg/m   Opioid Risk Score:   Fall Risk Score:  `1  Depression screen PHQ 2/9  Depression screen PHQ 2/9 02/14/2021  Decreased Interest 0  Down, Depressed, Hopeless 0  PHQ - 2 Score 0  Altered sleeping 0  Tired, decreased energy 0  Change in appetite 0  Feeling bad or failure about yourself  0  Trouble concentrating 0  Moving slowly or fidgety/restless 0  Suicidal thoughts 0  PHQ-9 Score 0  Some recent data might be hidden   Review of Systems  Gastrointestinal: Positive for constipation.  Genitourinary: Positive for urgency.  Musculoskeletal: Positive for back pain.       Lower left side back pain  Left leg spasms  Neurological: Positive for tremors, weakness and numbness.  Hematological: Bruises/bleeds easily.  All other systems reviewed and are negative.      Objective:   Physical Exam Neurological:     Deep Tendon Reflexes:     Reflex Scores:      Tricep reflexes are 3+ on the right side and 3+ on the left side.      Bicep reflexes are 2+ on the right side and 2+ on the left side.      Brachioradialis reflexes are 1+ on the right side and 1+ on the left side.      Patellar reflexes are 2+ on the right side and 4+ on the left side.      Achilles reflexes are 2+ on the right side and 3+ on the left side.   Clonus at the left knee Tone MAS 0 bilateral quads and hamstrings as well as bilateral biceps triceps Motor strength is 5/5 bilateral deltoid bicep tricep grip hip flexion is limited by pain knee extension is 5/5 bilaterally as well as ankle dorsiflexion Patient states that sensation to light touch is equal bilateral upper and lower limbs  Lumbar spine  with muscle spasm  bilaterally left greater than right. Lumbar range of motion 75% with flexion less than 25% of normal lumbar extension. Negative straight leg raising bilaterally Tenderness palpation lumbar paraspinals from L4-S1 left greater than right side     Assessment & Plan:  #1.  Spasticity question whether this is related to his prior stroke given that this was a PICA infarct and usually does not cause any significant tone issues.  He does have upper extremity hyperreflexia with a prior history of cervical stenosis, may have cervical myelopathy.  Cervical spine MRI ordered by Dr. Lavell Anchors results are pending. We discussed that he may benefit from a trial of Zanaflex 4 mg 3 times daily since this may help with his lumbar spasms as well I will see the patient back in 1 month if his back pain is doing better we may consider botulinum toxin to the left quad 100 units.  #2.  Lumbar pain left greater than right no sciatic symptoms.  Currently undergoing work-up for this by primary care.  Plans are for him to see urology to rule out kidney stone.  Lumbar spine imaging was performed at an urgent care and these results will be sent to the patient's primary care physician if the patient needs any interventional procedures for this would be happy to reevaluate him in regards to this but would need a referral from PCP.

## 2021-02-14 NOTE — Patient Instructions (Addendum)
No baclofen for now  Try tizanidine in place of baclofen

## 2021-02-15 ENCOUNTER — Telehealth: Payer: Self-pay | Admitting: Physical Medicine & Rehabilitation

## 2021-02-15 ENCOUNTER — Ambulatory Visit
Admission: RE | Admit: 2021-02-15 | Discharge: 2021-02-15 | Disposition: A | Discharge status: Home / Self Care | Payer: Medicare Other | Source: Ambulatory Visit | Attending: Neurology | Admitting: Neurology

## 2021-02-15 DIAGNOSIS — R270 Ataxia, unspecified: Secondary | ICD-10-CM

## 2021-02-15 DIAGNOSIS — R252 Cramp and spasm: Secondary | ICD-10-CM

## 2021-02-15 DIAGNOSIS — R258 Other abnormal involuntary movements: Secondary | ICD-10-CM

## 2021-02-15 NOTE — Telephone Encounter (Signed)
Left patient a voicemail to schedule a follow up with Dr. Letta Pate for low back pain.  Patient was seen as new patient on 02/14/21, but this referral was for spasticity from Dr. Jaynee Eagles.  Dr. Letta Pate wants to schedule the low back pain follow up separate for the spasticity visit.  There is a referral that I have put in for the low back pain.  Make sure we attach the referral to visit when patient calls back.

## 2021-02-16 ENCOUNTER — Encounter: Payer: Medicare Other | Admitting: Physical Medicine & Rehabilitation

## 2021-02-16 ENCOUNTER — Other Ambulatory Visit: Payer: Self-pay

## 2021-02-16 ENCOUNTER — Encounter: Payer: Self-pay | Admitting: Physical Medicine & Rehabilitation

## 2021-02-16 VITALS — BP 114/72 | HR 62 | Temp 97.4°F | Ht 68.0 in | Wt 203.2 lb

## 2021-02-16 DIAGNOSIS — G8929 Other chronic pain: Secondary | ICD-10-CM

## 2021-02-16 DIAGNOSIS — M7918 Myalgia, other site: Secondary | ICD-10-CM | POA: Diagnosis not present

## 2021-02-16 DIAGNOSIS — M545 Low back pain, unspecified: Secondary | ICD-10-CM

## 2021-02-16 DIAGNOSIS — G801 Spastic diplegic cerebral palsy: Secondary | ICD-10-CM | POA: Diagnosis not present

## 2021-02-16 NOTE — Patient Instructions (Signed)

## 2021-02-16 NOTE — Progress Notes (Signed)
Subjective:     Patient ID: [name], male   DOB: [birth year], 65 y.o.   MRN: [id number]  HPI 65 year old male with history of left PICA infarct in 2017 who was referred by neurology to evaluate spasticity in the lower extremities.  The patient was seen by Dr. Lavell Anchors on 02/09/2021.  Upper extremity spasticity was noted in addition to the lower extremity spasticity and patient was sent for cervical spine imaging. Patient also notes severe low back pain which started a couple weeks ago.  He has an appointment to see a urologist to evaluate for kidney stones.  His past medical history is significant for L4 compression fracture status post vertebroplasty  Past surgical history C5-6 C6-7 ACDF in 2017 by Dr. Rolena Infante.  Patient here to focus on his low back pain, left-sided greater than right-sided.  He does not recall any specific trauma to that area.  No pain radiating down the left leg.  Had some preliminary studies to look for kidney stones and this were reportedly negative however the patient does have a follow-up appointment with urology. Pain is limiting his mobility he is having trouble with his walking.  I have reports of his thoracic and lumbar spine films which report degenerative disc as well as spondylosis multilevel Pain Inventory Average Pain 8 Pain Right Now 8 My pain is constant, sharp, stabbing and aching  In the last 24 hours, has pain interfered with the following? General activity 10 Relation with others 0 Enjoyment of life 10 What TIME of day is your pain at its worst? morning , daytime and evening Sleep (in general) Fair  Pain is worse with: walking, bending and standing Pain improves with: Nothing Relief from Meds: 0     Family History  Problem Relation Age of Onset  . Heart disease Father   . Heart disease Maternal Grandmother   . Heart disease Maternal Grandfather   . Heart disease Paternal Grandmother   . Heart disease Paternal Grandfather    Social  History   Socioeconomic History  . Marital status: Significant Other    Spouse name: [name]  . Number of children: 2  . Years of education: 63  . Highest education level: Not on file  Occupational History  . Occupation: Disabled   Tobacco Use  . Smoking status: Current Every Day Smoker    Packs/day: 0.50    Years: 20.00    Pack years: 10.00    Types: Cigarettes  . Smokeless tobacco: Never Used  . Tobacco comment: quit smoking 3-4 months ago  Vaping Use  . Vaping Use: Never used  Substance and Sexual Activity  . Alcohol use: No    Alcohol/week: 0.0 standard drinks    Comment: none in about 3 years or more   . Drug use: No  . Sexual activity: Not on file  Other Topics Concern  . Not on file  Social History Narrative   Lives with [name]   Caffeine use: Drinks coffee/tea/soda (2 cups coffee/morning) + at least 2 sodas per day    Social Determinants of Health   Financial Resource Strain: Not on file  Food Insecurity: Not on file  Transportation Needs: Not on file  Physical Activity: Not on file  Stress: Not on file  Social Connections: Not on file   Past Surgical History:  Procedure Laterality Date  . ANTERIOR CERVICAL DECOMP/DISCECTOMY FUSION N/A 06/14/2016   Procedure: ANTERIOR CERVICAL DECOMPRESSION/DISCECTOMY FUSION C4 - C5, CERVICAL 6-C7 2 LEVEL;  Surgeon: Melina Schools,  MD;  Location: [place];  Service: Orthopedics;  Laterality: N/A;  . BIOPSY  03/03/2020   Procedure: BIOPSY;  Surgeon: Otis Brace, MD;  [facility];  Service: Gastroenterology;;  . CARDIAC CATHETERIZATION  06/27/2001; 12/13/2006; 02/20/2010  . COLONOSCOPY    . COLONOSCOPY WITH PROPOFOL N/A 03/03/2020   Procedure: COLONOSCOPY WITH PROPOFOL;  Surgeon: Otis Brace, MD;  [facility];  Service: Gastroenterology;  Laterality: N/A;  . CORONARY ANGIOPLASTY WITH STENT PLACEMENT  05/22/2001   PCI - RCA - LAD  . CORONARY ANGIOPLASTY WITH STENT PLACEMENT  11/11/2006   PCI - BMS - LAD   . CORONARY ANGIOPLASTY WITH STENT PLACEMENT  11/15/2006   PCI - BMS - RCA  . CORONARY ANGIOPLASTY WITH STENT PLACEMENT  03/16/2010  . CORONARY ARTERY BYPASS GRAFT N/A 01/01/2014   Procedure: Coronary artery bypass graft times two using left internal mammary artery and left leg greater saphenous vein harvested endoscopically.;  Surgeon: Gaye Pollack, MD;  [facility];  Service: Open Heart Surgery;  Laterality: N/A;  . EP IMPLANTABLE DEVICE N/A 04/05/2016   Procedure: Loop Recorder Insertion;  Surgeon: Evans Lance, MD;  Location: [place] [facility];  Service: Cardiovascular;  Laterality: N/A;  . ESOPHAGOGASTRODUODENOSCOPY (EGD) WITH PROPOFOL N/A 03/03/2020   Procedure: ESOPHAGOGASTRODUODENOSCOPY (EGD) WITH PROPOFOL;  Surgeon: Otis Brace, MD;  [facility];  Service: Gastroenterology;  Laterality: N/A;  . FOOT SURGERY Right    achilles tendon reattached  . HAND SURGERY Right    screws  . INTRAOPERATIVE TRANSESOPHAGEAL ECHOCARDIOGRAM N/A 01/01/2014   Procedure: INTRAOPERATIVE TRANSESOPHAGEAL ECHOCARDIOGRAM;  Surgeon: Gaye Pollack, MD;  Location: [facility] OR;  Service: Open Heart Surgery;  Laterality: N/A;  . KNEE ARTHROSCOPY     left knee x 1, right knee x 2  . KYPHOPLASTY N/A 12/06/2016   Procedure: KYPHOPLASTY L4 ;  Surgeon: Melina Schools, MD;  Location: [place];  Service: Orthopedics;  Laterality: N/A;  Requests for 3 hours  . LEFT HEART CATHETERIZATION WITH CORONARY ANGIOGRAM N/A 12/21/2013   Procedure: LEFT HEART CATHETERIZATION WITH CORONARY ANGIOGRAM;  Surgeon: Burnell Blanks, MD;  Location: [facility];  Service: Cardiovascular;  Laterality: N/A;  . LUMBAR LAMINECTOMY/DECOMPRESSION MICRODISCECTOMY Right 12/06/2016   Procedure: L4-S1 decompression right side ;  Surgeon: Melina Schools, MD;  Location: [place];  Service: Orthopedics;  Laterality: Right;  . OPEN REDUCTION INTERNAL FIXATION (ORIF) SCAPHOID WITH ILIAC CREST BONE GRAFT  10/15/2012   Procedure: OPEN REDUCTION  INTERNAL FIXATION (ORIF) SCAPHOID WITH ILIAC CREST BONE GRAFT;  Surgeon: Schuyler Amor, MD;  Location: [place];  Service: Orthopedics;  Laterality: Right;  No iliac creast bone graft taken, used graft from radius  . POLYPECTOMY  03/03/2020   Procedure: POLYPECTOMY;  Surgeon: Otis Brace, MD;  [facility];  Service: Gastroenterology;;   Past Medical History:  Diagnosis Date  . Arthritis   . BPH (benign prostatic hyperplasia)   . CAD (coronary artery disease)    a. s/p prior MI; hx of stent to RCA and LAD;  b. LHC 12/2006:  LM 20%, pLAD 40%, mLAD stent ok, apical LAD 90% (1.5-18mm vessel), oD1 40-50%, AV groove CFX 30%, oOM1 30%, pRCA 30% (multiple), mRCA stent ok with 30-40% ISR, dRCA 30%, dAnt, inf-apical severe HK, EF 40% => med Rx.;  12/2013 Cath: LM nl, LAD 95ost, 52m ISR, 99apex, LCX nl, RCA 40p, 100 ISR, L->R collats, EF 35-40%-->pending CABG  . Chronic back pain    lumbar compression fracture  and stenosis  . Clavicle fracture   . Cryptogenic stroke ([place])   . ED (erectile dysfunction)   . GERD (gastroesophageal reflux disease)    occ  . Hypertension    takes Losartan,HCTZ,  and Metoprolol daily  . Ischemic cardiomyopathy    a. Echo 09/2012:  Mod LVH, focal basal hypertrophy, EF 30-35%, mid to dist inf-lat HK, Gr 1 diast dysfn, mild to mod LAE  . Mixed hyperlipidemia    takes Atorvastatin daily  . Myocardial infarction ([place]) 2015   x2  . Peripheral vascular disease ([place])   . Scaphoid non-union advanced collapse   . Sleep apnea    not using cpap at present had new test and getting new cpap  . Stroke ([place]) 03/2016  . Urinary frequency    takes Flomax daily  . Vitamin B12 deficiency    There were no vitals taken for this visit.  Opioid Risk Score:   Fall Risk Score:  `1  Depression screen PHQ 2/9  Depression screen [facility] 2/9 02/14/2021 02/14/2021  Decreased Interest 0 0  Down, Depressed, Hopeless 0 0  PHQ - 2 Score 0 0  Altered sleeping 0  0  Tired, decreased energy 0 0  Change in appetite 0 0  Feeling bad or failure about yourself  0 0  Trouble concentrating 0 0  Moving slowly or fidgety/restless 0 0  Suicidal thoughts 0 0  PHQ-9 Score 0 0  Some recent data might be hidden   Review of Systems  Musculoskeletal: Positive for back pain and gait problem.       Left back pain  All other systems reviewed and are negative.      Objective:   Physical Exam Vitals and nursing note reviewed.  Constitutional:      Appearance: He is obese.  HENT:     Head: Normocephalic and atraumatic.  Eyes:     Extraocular Movements: Extraocular movements intact.     Conjunctiva/sclera: Conjunctivae normal.     Pupils: Pupils are equal, round, and reactive to light.  Musculoskeletal:     Comments: Tenderness to palpation left L2 L3-L4-L5 paraspinal area No evidence of scoliosis Mildly diminished hip range of motion bilaterally Lumbar spine range of motion 75% lumbar flexion 25% extension 25% lateral bending extension is more painful than flexion  Neurological:     Mental Status: He is alert and oriented to person, place, and time.     Cranial Nerves: No cranial nerve deficit or dysarthria.     Sensory: Sensory deficit present.     Comments: Sensation reduced to pinprick entire right hemibody patient states that this has been since CVA. Negative straight leg raising bilaterally Motor strength is 5/5 right deltoid bicep tricep grip hip flexor knee extensor ankle dorsiflexion plantarflexion 5/5 in the left deltoid bicep tricep grip hip flexion knee extension ankle dorsiflexion.   Psychiatric:        Mood and Affect: Mood normal.        Behavior: Behavior normal.        Assessment:     #1.  Left-sided lumbar axial pain no evidence of sciatic symptoms.  We discussed that pain is likely multifactorial it was insidious onset difficult to determine.  The patient does have definite muscle tenderness on the left side even with fairly  light palpation so there is a myofascial component.  Also suspect that there is a deeper component i.e. lumbar spondylosis as seen on imaging studies.  Patient does have pain with extension  greater than with flexion which would also support this.    Plan:     #1.  Discussed with patient the need for physical therapy however he is too uncomfortable to undergo physical therapy at the current time.  He has difficulty even with walking.  We will do trigger point injections to address myofascial pain.  In addition we will do lumbar medial branch blocks in a week or 2 to address the lumbar spondylosis pain and then plan to start physical therapy

## 2021-02-20 ENCOUNTER — Telehealth: Payer: Self-pay | Admitting: *Deleted

## 2021-02-20 DIAGNOSIS — M4802 Spinal stenosis, cervical region: Secondary | ICD-10-CM

## 2021-02-20 NOTE — Telephone Encounter (Signed)
Spoke with patient and discussed results of MRI cervical spine as noted below by Dr. Jaynee Eagles.  Patient verbalized understanding and stated he has seen Dr Letta Pate for low back injections. I let him know a message would be sent to Dr Jaynee Eagles and we would refer to a provider but it may be neurosurgery. Pt verbalized understanding and appreciation for the call.

## 2021-02-20 NOTE — Telephone Encounter (Signed)
-----   Message from Melvenia Beam, MD sent at 02/20/2021 11:49 AM EDT ----- He has some moderate spinal stenosis at C6/C7 we should probably get someone to take a looka t that, please give him a call and if he is okw with that we can refer him to [place] neurosurgery unless he has someone else he goes to/sees (ortho or neurosurgery)

## 2021-02-21 NOTE — Telephone Encounter (Signed)
Spoke with Dr Jaynee Eagles. Will refer to Dr Vertell Limber @ [facility].   Referral order placed.

## 2021-02-21 NOTE — Addendum Note (Signed)
Addended by: Gildardo Griffes on: 02/21/2021 04:18 PM   Modules accepted: Orders

## 2021-02-23 ENCOUNTER — Other Ambulatory Visit: Payer: Self-pay

## 2021-02-23 ENCOUNTER — Encounter: Payer: Self-pay | Admitting: Physical Medicine & Rehabilitation

## 2021-02-23 ENCOUNTER — Encounter: Payer: Medicare Other | Admitting: Physical Medicine & Rehabilitation

## 2021-02-23 VITALS — BP 131/66 | HR 69 | Temp 98.1°F | Ht 68.0 in | Wt 204.2 lb

## 2021-02-23 DIAGNOSIS — M47817 Spondylosis without myelopathy or radiculopathy, lumbosacral region: Secondary | ICD-10-CM | POA: Insufficient documentation

## 2021-02-23 DIAGNOSIS — G801 Spastic diplegic cerebral palsy: Secondary | ICD-10-CM | POA: Diagnosis not present

## 2021-02-23 MED ORDER — TRAMADOL HCL 50 MG PO TABS: 50.0000 mg | ORAL_TABLET | Freq: Two times a day (BID) | ORAL | 0 refills | Status: DC | PRN

## 2021-02-23 NOTE — Progress Notes (Signed)
  PROCEDURE RECORD [facility]   Name: [name] DOB:1956-10-25 MRN: [id number]  Date:02/23/2021  Physician: Alysia Penna, MD    Nurse/CMA: Jorja Loa MA  Allergies:  Allergies  Allergen Reactions  . Hydrocodone Other (See Comments)    Throat itchy, weakness- took this during stroke not sure if caused problem Other reaction(s): throat itchy, weakness    Consent Signed: Yes.    Is patient diabetic? No.  CBG today? N/A  Pregnant: No. LMP: No LMP for male patient. (age 51-55)  Anticoagulants: no Anti-inflammatory: no Antibiotics: no  Procedure:Medial Branch Block L3,4,5 Position: Prone Start Time:1:26 pm End Time:  1:37 pm  Fluoro Time:24  RN/CMA Lubna Stegeman MA Chena Chohan MA    Time 1:08 pm 1:38PM    BP 131/99 126/70    Pulse 69 60    Respirations 16 16    O2 Sat 96 97    S/S 6 6    Pain Level 6/10 6/10     D/C home with Wife, patient A & O X 3, D/C instructions reviewed, and sits independently.

## 2021-02-23 NOTE — Progress Notes (Signed)
Left Lumbar L3, L4  medial branch blocks and L 5 dorsal ramus injection under fluoroscopic guidance   Indication: Left Lumbar pain which is not relieved by medication management or other conservative care and interfering with self-care and mobility.  Informed consent was obtained after describing risks and benefits of the procedure with the patient, this includes bleeding, bruising, infection, paralysis and medication side effects.  The patient wishes to proceed and has given written consent.  The patient was placed in a prone position.  The lumbar area was marked and prepped with Betadine.  One mL of 1% lidocaine was injected into each of 3 areas into the skin and subcutaneous tissue.  Then a 22-gauge 3.5in spinal needle was inserted targeting the junction of the left S1 superior articular process and sacral ala junction.  Needle was advanced under fluoroscopic guidance.  Bone contact was made. Isovue 200 was injected x 0.5 mL demonstrating no intravascular uptake.  Then a solution containing  2% MPF lidocaine was injected x 0.5 mL.  Then the left L5 superior articular process in transverse process junction was targeted.  Bone contact was made. Isovue 200 was injected x 0.5 mL demonstrating no intravascular uptake.  Then a solution containing 2% MPF lidocaine was injected x 0.5 mL.  Then the left L4 superior articular process in transverse process junction was targeted.  Bone contact was made.  Isovue 200 was injected x 0.5 mL demonstrating no intravascular uptake.  Then a solution containing  2% MPF lidocaine was injected x 0.5 mL.  Patient tolerated procedure well.  Post procedure instructions were given. 

## 2021-02-23 NOTE — Patient Instructions (Signed)

## 2021-02-24 ENCOUNTER — Telehealth: Payer: Self-pay | Admitting: Internal Medicine

## 2021-02-24 ENCOUNTER — Ambulatory Visit (HOSPITAL_COMMUNITY): Payer: Medicare Other | Attending: Internal Medicine

## 2021-02-24 DIAGNOSIS — R9389 Abnormal findings on diagnostic imaging of other specified body structures: Secondary | ICD-10-CM | POA: Diagnosis not present

## 2021-02-24 DIAGNOSIS — I420 Dilated cardiomyopathy: Secondary | ICD-10-CM | POA: Insufficient documentation

## 2021-02-24 DIAGNOSIS — I513 Intracardiac thrombosis, not elsewhere classified: Secondary | ICD-10-CM

## 2021-02-24 DIAGNOSIS — E782 Mixed hyperlipidemia: Secondary | ICD-10-CM | POA: Diagnosis not present

## 2021-02-24 LAB — ECHOCARDIOGRAM COMPLETE
Area-P 1/2: 3.06 cm2
S' Lateral: 3.4 cm

## 2021-02-24 MED ORDER — PERFLUTREN LIPID MICROSPHERE
3.0000 mL | INTRAVENOUS | Status: AC | PRN
  Administered 2021-02-24: 3 mL via INTRAVENOUS

## 2021-02-24 NOTE — Telephone Encounter (Signed)
Called about Echo Results possible LV Thrombus.  Discussed recent back injections (6). Patient feels no change in symptoms. Discussed the risk and benefits of anticoagulation after spinal injections.  If Dr. Johnsie Cancel agrees (possible LV thrombus) will decide [facility] and/or CMR.  Patient had no further questions.  Werner Lean, MD

## 2021-02-27 NOTE — Addendum Note (Signed)
Addended by: [name] A on: 02/27/2021 08:33 AM   Modules accepted: Orders

## 2021-02-27 NOTE — Telephone Encounter (Signed)
Order placed for MRI.  Notified Pt's fiance Debra per DPR.  Advised per Dr. Roddie Mc MRI.  Do NOT start blood thinner d/t spinal injection.  No questions.  Sent to MRI scheduling and precertt

## 2021-02-28 ENCOUNTER — Telehealth: Payer: Self-pay | Admitting: Cardiovascular Disease

## 2021-02-28 NOTE — Telephone Encounter (Signed)
Spoke with patient regarding the scheduled Cardiac MRI on 03/02/21 at 8:00 am---arrival time is 7:30 am --1st floor admissions office for check in.  Patient voiced his understanding.

## 2021-03-01 ENCOUNTER — Other Ambulatory Visit (HOSPITAL_COMMUNITY): Payer: Self-pay | Admitting: Cardiovascular Disease

## 2021-03-01 ENCOUNTER — Telehealth (HOSPITAL_COMMUNITY): Payer: Self-pay | Admitting: Emergency Medicine

## 2021-03-01 NOTE — Telephone Encounter (Signed)
Reaching out to patient to offer assistance regarding upcoming cardiac imaging study; pt verbalizes understanding of appt date/time, parking situation and where to check in, and verified current allergies; name and call back number provided for further questions should they arise Marchia Bond RN [facility] [phone] office [phone] cell   Denies claustrophobia. Has reveal linq loop recorder and sternum binders from CABG Clarise Cruz

## 2021-03-02 ENCOUNTER — Ambulatory Visit (HOSPITAL_COMMUNITY)
Admission: RE | Admit: 2021-03-02 | Discharge: 2021-03-02 | Disposition: A | Discharge status: Home / Self Care | Payer: Medicare Other | Source: Ambulatory Visit | Attending: Cardiovascular Disease | Admitting: Cardiovascular Disease

## 2021-03-02 ENCOUNTER — Other Ambulatory Visit: Payer: Self-pay

## 2021-03-02 DIAGNOSIS — I513 Intracardiac thrombosis, not elsewhere classified: Secondary | ICD-10-CM

## 2021-03-02 MED ORDER — GADOBUTROL 1 MMOL/ML IV SOLN
10.0000 mL | Freq: Once | INTRAVENOUS | Status: AC | PRN
  Administered 2021-03-02: 10 mL via INTRAVENOUS

## 2021-03-03 ENCOUNTER — Telehealth: Payer: Self-pay

## 2021-03-03 ENCOUNTER — Telehealth: Payer: Self-pay | Admitting: Cardiovascular Disease

## 2021-03-03 NOTE — Telephone Encounter (Signed)
**  Patient aware Dr. Letta Pate is out of the office until next Tuesday 03/07/2021.   [name]'s diastolic blood pressure reading have been low. Last night the reading were 60/40.  He has informed his Cardiologist of the readings. Cardiology has changed his medication regimen. And advised him to take the Tizanidine only twice daily.   Patient has been advised to call EMS or seek emergency care if symptoms worsen. And follow his Cardiologist advice. Dr. Read Drivers will be informed.

## 2021-03-03 NOTE — Telephone Encounter (Signed)
Attempted phone call to pt and left voicemail message to contact RN at [phone]. 

## 2021-03-03 NOTE — Telephone Encounter (Signed)
New message    Pt c/o medication issue:  1. Name of Medication: tizanidine HCL 4mg   2. How are you currently taking this medication (dosage and times per day)? 1 tablet three times a day  3. Are you having a reaction (difficulty breathing--STAT)?  no  4. What is your medication issue? Patient was prescribed this medication by his pain management doctor for leg spasms about 1 week ago.  Since starting medication, patient says his bp drops to 60/40, 58/42 at night.  He stopped three times a day and went to twice a day and his bp is still dropping low at night.  Patient want to now what Dr Johnsie Cancel says about this.  Please advise.

## 2021-03-03 NOTE — Telephone Encounter (Signed)
Gave patient advisement from pharmacist. Patient will hold HCTZ over the weekend and collect BP and HR readings. Will forward to Dr. Johnsie Cancel for further recommendations. Office will call next week to get readings from patient.

## 2021-03-03 NOTE — Telephone Encounter (Signed)
Follow up ° ° ° ° ° °Returning a call to the nurse °

## 2021-03-03 NOTE — Telephone Encounter (Signed)
Yes, there is a drug interaction with his blood pressure medications. Tizandine can enhance the effect of his blood pressure medications. If he is going to continue on tizanidine he probably should try stopping his HCTZ to see if his BP improves. He should watch his HR as well as it can increase the effect of beta blockers as well.

## 2021-03-03 NOTE — Telephone Encounter (Signed)
Spoke with pt who reports since starting Tizanidine 4mg  - 1 tablet by mouth tid last week.  He has been having episode of low BP in the evenings or night.  Pt reports his BP has been as low as 60/40.  Pt states when this happens he does not feel well and "sees stars"  Denies any additional Sx of CP or SOB. Pt reports he has reduced medication to taking at 8am and 5-6pm but continues to have low BP readings in the evening. He feels certain his symptoms are related to the new medication as this does not happen when he does not take Tizanidine.  Pt states his am BP runs 130-140's/80-90's.  Pt states he is not taking Tramadol or Viagra as listed on his MAR but takes other medications as prescribed.  He has not contacted his pan management provider to notify of BP issues with medication. Pt advised Dr Johnsie Cancel is not in the office today.  Advised to contact Pain Management clinic to discuss issues with Tizanidine.  Will forward to our pharmacy team and Dr Johnsie Cancel for review and additional recommendation.  Reviewed ED precautions. Pt verbalizes understanding and agrees with current plan.

## 2021-03-06 NOTE — Telephone Encounter (Signed)
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

## 2021-03-06 NOTE — Telephone Encounter (Signed)
BP and HR readings:  4/29 (pt took HCTZ) : 88/58 64 Started hold HCTZ 4/30: 104/69 66 AM, 124/75 62 PM  5/1: 138/88 64 AM, 46286 38 PM  5/2: 147/88 65 AM, 109/70 64 current.  Of note taking Tizanidine 2 times daily.  Will forward to Dr. Johnsie Cancel for further advisement.

## 2021-03-06 NOTE — Telephone Encounter (Signed)
I have him down for 5/19 @ 9:00 with PharmD. We have openings 5/19 and on. So if that date or time doesn't work, we can move.

## 2021-03-06 NOTE — Telephone Encounter (Signed)
Gave the patient appointment date and time. He is in agreement.

## 2021-03-06 NOTE — Telephone Encounter (Signed)
Gave recommendations to stop Zanaflex and continue all cardiac medications. Will see if pharmD has any openings.  Patient verbalized understanding.

## 2021-03-07 ENCOUNTER — Other Ambulatory Visit (HOSPITAL_COMMUNITY): Payer: Medicare HMO

## 2021-03-08 ENCOUNTER — Other Ambulatory Visit: Payer: Self-pay | Admitting: Physical Medicine & Rehabilitation

## 2021-03-11 ENCOUNTER — Other Ambulatory Visit: Payer: Self-pay | Admitting: Physical Medicine & Rehabilitation

## 2021-03-17 ENCOUNTER — Ambulatory Visit: Payer: Medicare Other | Admitting: Physical Medicine & Rehabilitation

## 2021-03-21 ENCOUNTER — Encounter: Payer: Medicare Other | Attending: Physical Medicine & Rehabilitation | Admitting: Physical Medicine & Rehabilitation

## 2021-03-21 ENCOUNTER — Other Ambulatory Visit: Payer: Self-pay

## 2021-03-21 ENCOUNTER — Encounter: Payer: Self-pay | Admitting: Physical Medicine & Rehabilitation

## 2021-03-21 VITALS — BP 169/101 | HR 66 | Temp 97.7°F | Ht 68.0 in | Wt 207.2 lb

## 2021-03-21 DIAGNOSIS — M25511 Pain in right shoulder: Secondary | ICD-10-CM | POA: Insufficient documentation

## 2021-03-21 NOTE — Progress Notes (Signed)
Subjective:    Patient ID: [name], male    DOB: 1956/10/05, 65 y.o.   MRN: [id number]  HPI Walking back to normal since Lumbar MBB 02/23/21 Left Lumbar L3, L4  medial branch blocks and L 5 dorsal ramus injection under fluoroscopic guidance  Still walks with a limp's since his left PICA infarct a couple years ago. Left lower limb spasms have improved somewhat he has had some problems with low blood pressure due to tizanidine.  Pain Inventory Average Pain 4 Pain Right Now 4 My pain is constant and dull  LOCATION OF PAIN  Back, shoulder  BOWEL Number of stools per week: 3 Oral laxative use No  Type of laxative na Enema or suppository use No  History of colostomy No  Incontinent No   BLADDER Normal In and out cath, frequency na Able to self cath na Bladder incontinence No  Frequent urination Yes  Leakage with coughing No  Difficulty starting stream Yes  Incomplete bladder emptying No    Mobility walk with assistance use a cane how many minutes can you walk? 10 ability to climb steps?  yes do you drive?  yes  Function disabled: date disabled 2015  Neuro/Psych weakness numbness trouble walking spasms  Prior Studies CT/MRI  Physicians involved in your care Cardiologist and Urologist   Family History  Problem Relation Age of Onset  . Heart disease Father   . Heart disease Maternal Grandmother   . Heart disease Maternal Grandfather   . Heart disease Paternal Grandmother   . Heart disease Paternal Grandfather    Social History   Socioeconomic History  . Marital status: Significant Other    Spouse name: [name]  . Number of children: 2  . Years of education: 67  . Highest education level: Not on file  Occupational History  . Occupation: Disabled   Tobacco Use  . Smoking status: Current Every Day Smoker    Packs/day: 0.50    Years: 20.00    Pack years: 10.00    Types: Cigarettes  . Smokeless tobacco: Never Used  . Tobacco comment: quit  smoking 3-4 months ago  Vaping Use  . Vaping Use: Never used  Substance and Sexual Activity  . Alcohol use: No    Alcohol/week: 0.0 standard drinks    Comment: none in about 3 years or more   . Drug use: No  . Sexual activity: Not on file  Other Topics Concern  . Not on file  Social History Narrative   Lives with [name]   Caffeine use: Drinks coffee/tea/soda (2 cups coffee/morning) + at least 2 sodas per day    Social Determinants of Health   Financial Resource Strain: Not on file  Food Insecurity: Not on file  Transportation Needs: Not on file  Physical Activity: Not on file  Stress: Not on file  Social Connections: Not on file   Past Surgical History:  Procedure Laterality Date  . ANTERIOR CERVICAL DECOMP/DISCECTOMY FUSION N/A 06/14/2016   Procedure: ANTERIOR CERVICAL DECOMPRESSION/DISCECTOMY FUSION C4 - C5, CERVICAL 6-C7 2 LEVEL;  Surgeon: Melina Schools, MD;  Location: [facility];  Service: Orthopedics;  Laterality: N/A;  . BIOPSY  03/03/2020   Procedure: BIOPSY;  Surgeon: Otis Brace, MD;  Location: [facility];  Service: Gastroenterology;;  . CARDIAC CATHETERIZATION  06/27/2001; 12/13/2006; 02/20/2010  . COLONOSCOPY    . COLONOSCOPY WITH PROPOFOL N/A 03/03/2020   Procedure: COLONOSCOPY WITH PROPOFOL;  Surgeon: Otis Brace, MD;  Location: [facility];  Service: Gastroenterology;  Laterality: N/A;  . CORONARY ANGIOPLASTY WITH STENT PLACEMENT  05/22/2001   PCI - RCA - LAD  . CORONARY ANGIOPLASTY WITH STENT PLACEMENT  11/11/2006   PCI - BMS - LAD  . CORONARY ANGIOPLASTY WITH STENT PLACEMENT  11/15/2006   PCI - BMS - RCA  . CORONARY ANGIOPLASTY WITH STENT PLACEMENT  03/16/2010  . CORONARY ARTERY BYPASS GRAFT N/A 01/01/2014   Procedure: Coronary artery bypass graft times two using left internal mammary artery and left leg greater saphenous vein harvested endoscopically.;  Surgeon: Gaye Pollack, MD;  Location: [facility];  Service: Open Heart Surgery;  Laterality: N/A;  . EP  IMPLANTABLE DEVICE N/A 04/05/2016   Procedure: Loop Recorder Insertion;  Surgeon: Evans Lance, MD;  Location: [place] [facility];  Service: Cardiovascular;  Laterality: N/A;  . ESOPHAGOGASTRODUODENOSCOPY (EGD) WITH PROPOFOL N/A 03/03/2020   Procedure: ESOPHAGOGASTRODUODENOSCOPY (EGD) WITH PROPOFOL;  Surgeon: Otis Brace, MD;  [facility]: [facility];  Service: Gastroenterology;  Laterality: N/A;  . FOOT SURGERY Right    achilles tendon reattached  . HAND SURGERY Right    screws  . INTRAOPERATIVE TRANSESOPHAGEAL ECHOCARDIOGRAM N/A 01/01/2014   Procedure: INTRAOPERATIVE TRANSESOPHAGEAL ECHOCARDIOGRAM;  Surgeon: Gaye Pollack, MD;  Location[facility] OR;  Service: Open Heart Surgery;  Laterality: N/A;  . KNEE ARTHROSCOPY     left knee x 1, right knee x 2  . KYPHOPLASTY N/A 12/06/2016   Procedure: KYPHOPLASTY L4 ;  Surgeon: Melina Schools, MD;  Location: [place];  Service: Orthopedics;  Laterality: N/A;  Requests for 3 hours  . LEFT HEART CATHETERIZATION WITH CORONARY ANGIOGRAM N/A 12/21/2013   Procedure: LEFT HEART CATHETERIZATION WITH CORONARY ANGIOGRAM;  Surgeon: Burnell Blanks, MD;  Location[facility] CATH LAB;  Service: Cardiovascular;  Laterality: N/A;  . LUMBAR LAMINECTOMY/DECOMPRESSION MICRODISCECTOMY Right 12/06/2016   Procedure: L4-S1 decompression right side ;  Surgeon: Melina Schools, MD;  Location: [facility];  Service: Orthopedics;  Laterality: Right;  . OPEN REDUCTION INTERNAL FIXATION (ORIF) SCAPHOID WITH ILIAC CREST BONE GRAFT  10/15/2012   Procedure: OPEN REDUCTION INTERNAL FIXATION (ORIF) SCAPHOID WITH ILIAC CREST BONE GRAFT;  Surgeon: Schuyler Amor, MD;  Location: [place];  Service: Orthopedics;  Laterality: Right;  No iliac creast bone graft taken, used graft from radius  . POLYPECTOMY  03/03/2020   Procedure: POLYPECTOMY;  Surgeon: Otis Brace, MD;  Location: [facility];  Service: Gastroenterology;;   Past Medical History:  Diagnosis Date  . Arthritis   .  BPH (benign prostatic hyperplasia)   . CAD (coronary artery disease)    a. s/p prior MI; hx of stent to RCA and LAD;  b. LHC 12/2006:  LM 20%, pLAD 40%, mLAD stent ok, apical LAD 90% (1.5-45mm vessel), oD1 40-50%, AV groove CFX 30%, oOM1 30%, pRCA 30% (multiple), mRCA stent ok with 30-40% ISR, dRCA 30%, dAnt, inf-apical severe HK, EF 40% => med Rx.;  12/2013 Cath: LM nl, LAD 95ost, 61m ISR, 99apex, LCX nl, RCA 40p, 100 ISR, L->R collats, EF 35-40%-->pending CABG  . Chronic back pain    lumbar compression fracture and stenosis  . Clavicle fracture   . Cryptogenic stroke ([place])   . ED (erectile dysfunction)   . GERD (gastroesophageal reflux disease)    occ  . Hypertension    takes Losartan,HCTZ,  and Metoprolol daily  . Ischemic cardiomyopathy    a. Echo 09/2012:  Mod LVH, focal basal hypertrophy, EF 30-35%, mid to dist inf-lat HK, Gr 1 diast dysfn, mild to mod  LAE  . Mixed hyperlipidemia    takes Atorvastatin daily  . Myocardial infarction ([place]) 2015   x2  . Peripheral vascular disease ([place])   . Scaphoid non-union advanced collapse   . Sleep apnea    not using cpap at present had new test and getting new cpap  . Stroke ([place]) 03/2016  . Urinary frequency    takes Flomax daily  . Vitamin B12 deficiency    BP (!) 169/101   Pulse 66   Temp 97.7 F (36.5 C)   Ht 5\' 8"  (1.727 m)   Wt 207 lb 3.2 oz (94 kg)   SpO2 96%   BMI 31.50 kg/m   Opioid Risk Score:   Fall Risk Score:  `1  Depression screen PHQ 2/9  Depression screen [facility] 2/9 02/23/2021 02/16/2021 02/14/2021 02/14/2021  Decreased Interest 0 0 0 0  Down, Depressed, Hopeless 0 0 0 0  PHQ - 2 Score 0 0 0 0  Altered sleeping - - 0 0  Tired, decreased energy - - 0 0  Change in appetite - - 0 0  Feeling bad or failure about yourself  - - 0 0  Trouble concentrating - - 0 0  Moving slowly or fidgety/restless - - 0 0  Suicidal thoughts - - 0 0  PHQ-9 Score - - 0 0  Some recent data might be hidden    Review of Systems   Musculoskeletal: Positive for back pain and gait problem.  Neurological: Positive for weakness and numbness.  All other systems reviewed and are negative.      Objective:   Physical Exam Vitals and nursing note reviewed.  Constitutional:      Appearance: He is obese.  Eyes:     Extraocular Movements: Extraocular movements intact.     Conjunctiva/sclera: Conjunctivae normal.     Pupils: Pupils are equal, round, and reactive to light.  Neurological:     Mental Status: He is alert and oriented to person, place, and time.     Comments: Motor strength is 4+ right grip otherwise 5/5 in the right upper and right lower limb as well as left upper and left lower limb Mild dysmetria left finger-nose-finger Decreased temperature sensation right side Ambulates with cane no evidence of toe drag or knee instability  Psychiatric:        Mood and Affect: Mood normal.        Behavior: Behavior normal.     Positive impingement sign right shoulder at 90 degrees of abduction as well as forward flexion negative drop arm test.      Assessment & Plan:  1.  Chronic low back pain lumbar spondylosis improved after lumbar medial branch blocks left L3-4-5 performed 1 month ago Repeat MBB as needed  2.  Left PICA infarct with chronic mild balance disorder left-sided dysmetria mild right hemisensory deficits as well as decreased grip.  Patient would like to start exercising again which is a good idea provided that he does not do overhead lifting or use free weights.  He has a leg press machine as well as the LAT pulldown which I think is fine Partial squats without weight  Baclofen 5mg  qhs  3.  Right shoulder pain with overhead activity most likely has some rotator cuff and impingement would avoid overhead lifting strengthening of upper back muscles should be helpful he will be doing some LAT pull downs.

## 2021-03-21 NOTE — Patient Instructions (Addendum)
Please call if back pain increases  Avoid overhead lifting   Partial squats without weights

## 2021-03-23 ENCOUNTER — Encounter: Payer: Self-pay | Admitting: Pharmacist

## 2021-03-23 ENCOUNTER — Other Ambulatory Visit: Payer: Self-pay

## 2021-03-23 ENCOUNTER — Ambulatory Visit (INDEPENDENT_AMBULATORY_CARE_PROVIDER_SITE_OTHER): Payer: Medicare Other | Admitting: Pharmacist

## 2021-03-23 VITALS — BP 154/88 | HR 72 | Wt 204.6 lb

## 2021-03-23 DIAGNOSIS — I420 Dilated cardiomyopathy: Secondary | ICD-10-CM

## 2021-03-23 DIAGNOSIS — I10 Essential (primary) hypertension: Secondary | ICD-10-CM | POA: Diagnosis not present

## 2021-03-23 MED ORDER — METOPROLOL SUCCINATE ER 25 MG PO TB24: 12.5000 mg | ORAL_TABLET | Freq: Every day | ORAL | 2 refills | Status: DC

## 2021-03-23 NOTE — Patient Instructions (Addendum)
It was nice seeing you today  We would like to keep your blood pressure less than 130/80 but we also do not want it to drop too far  Continue your losartan 50mg  once a day  Discontinue your hydrochlorothiazide at this time Discontinue your metoprolol tartrate at this time  We will start a different metoprolol called metoprolol succinate which is a once a day medication.  You will take 12.5 mg (1/2 tablet) once a night  Continue checking your blood pressure daily  I will see you back in about 2 weeks  Karren Cobble, PharmD, BCACP, [place], [place] [address], [place], [place] [address] Phone: [phone]; Fax: [phone] 03/23/2021 9:52 AM

## 2021-03-23 NOTE — Progress Notes (Signed)
Patient ID: HANEEF Gill                 DOB: 1956-02-06                      MRN: [id number]     HPI: [name] is a 65 y.o. male referred by Dr.  Johnsie Cancel to HTN clinic. PMH is significant for CVA, HTN, OSA, CAD, angina, cardiomyopathy (EF 45-50%), stroke, and tobacco abuse.  Patient ambulates with a cane do to lower back pain, balance disorder, and shoulder pain managed by physical medicine and rehab.  Patient also currently has gross hematuria.  Having CT with contrast next Tuesday 5/24 to rule out tumor.  Renal function labs from  [facility] show eGFR of 105.5 and creatinine of 0.6.    Patient presents today with wife. Wife controls his medications and takes his blood pressure daily.  Patient had become hypotensive in late April after starting tizanidine. Blood pressure had dropped to 60/40 and patient reported "seeing stars."  Tizandine was discontinued and blood pressure began increasing again.  Is now on baclofen.  Wife brought home BP log:  5/19: 148/92 5/18: 160/97 AM, 91/58 noon, 110/65 PM 5/17: 168/99 AM, 124/74 noon, 92/60 PM 5/16: 157/97 AM 5/15: 132/83 AM, 99/62 PM 5/14: 104/67 AM, 116/72 PM 5/13: 132/82 AM, 90/57 noon, 104/67 PM  Patient currently smokes 1ppd  Patient's wife concerned regarding BP readings due to morning readings elevated and then slowly dropping during the day.  Currently takes losartan 31m, metroprolol tartrate 254m and HCTZ 12.26m46mll in morning.  Has lower extremity swelling in both legs and feet.  Current HTN meds: losartan 45m26mce daily, metoprolol tartrate 25 mg daily, HCTZ 12.26mg 57mly Previously tried: ramipril, losartan/hctz BP goal: <130/80  Social History: Smoke 1 pack per day  Home BP readings:  5/19: 148/92 5/18: 160/97 AM, 91/58 noon, 110/65 PM 5/17: 168/99 AM, 124/74 noon, 92/60 PM 5/16: 157/97 AM 5/15: 132/83 AM, 99/62 PM 5/14: 104/67 AM, 116/72 PM 5/13: 132/ 82 AM, 90/57 noon, 104/67 PM   Wt Readings from Last 3  Encounters:  03/21/21 207 lb 3.2 oz (94 kg)  02/23/21 204 lb 3.2 oz (92.6 kg)  02/16/21 203 lb 3.2 oz (92.2 kg)   BP Readings from Last 3 Encounters:  03/21/21 (!) 169/101  02/23/21 131/66  02/16/21 114/72   Pulse Readings from Last 3 Encounters:  03/21/21 66  02/23/21 69  02/16/21 62    Renal function: CrCl cannot be calculated (Patient's most recent lab result is older than the maximum 21 days allowed.).  Past Medical History:  Diagnosis Date  . Arthritis   . BPH (benign prostatic hyperplasia)   . CAD (coronary artery disease)    a. s/p prior MI; hx of stent to RCA and LAD;  b. LHC 12/2006:  LM 20%, pLAD 40%, mLAD stent ok, apical LAD 90% (1.5-2mm v58mel), oD1 40-50%, AV groove CFX 30%, oOM1 30%, pRCA 30% (multiple), mRCA stent ok with 30-40% ISR, dRCA 30%, dAnt, inf-apical severe HK, EF 40% => med Rx.;  12/2013 Cath: LM nl, LAD 95ost, 54m IS31m9apex, LCX nl, RCA 40p, 100 ISR, L->R collats, EF 35-40%-->pending CABG  . Chronic back pain    lumbar compression fracture and stenosis  . Clavicle fracture   . Cryptogenic stroke (HCC)   BronteD (erectile dysfunction)   . GERD (gastroesophageal reflux disease)    occ  . Hypertension    takes Losartan,HCTZ,  and Metoprolol daily  . Ischemic cardiomyopathy    a. Echo 09/2012:  Mod LVH, focal basal hypertrophy, EF 30-35%, mid to dist inf-lat HK, Gr 1 diast dysfn, mild to mod LAE  . Mixed hyperlipidemia    takes Atorvastatin daily  . Myocardial infarction ([place]) 2015   x2  . Peripheral vascular disease ([place])   . Scaphoid non-union advanced collapse   . Sleep apnea    not using cpap at present had new test and getting new cpap  . Stroke ([place]) 03/2016  . Urinary frequency    takes Flomax daily  . Vitamin B12 deficiency     Current Outpatient Medications on File Prior to Visit  Medication Sig Dispense Refill  . aspirin (ASPIRIN LOW DOSE) 81 MG EC tablet Take 1 tablet (81 mg total) by mouth daily. 90 tablet 3  . atorvastatin  (LIPITOR) 80 MG tablet Take 1 tablet (80 mg total) by mouth daily. Please make yearly appt with Dr. Johnsie Cancel for June 2022 before anymore refills. Thank you 1st ttempt 90 tablet 0  . Bempedoic Acid-Ezetimibe (NEXLIZET) 180-10 MG TABS Take 1 tablet by mouth daily. 90 tablet 3  . fenofibrate (TRICOR) 145 MG tablet TAKE 1 TABLET EVERY DAY 90 tablet 2  . hydrochlorothiazide (MICROZIDE) 12.5 MG capsule 1 capsule in the morning    . icosapent Ethyl (VASCEPA) 1 g capsule Take 2 capsules (2 g total) by mouth 2 (two) times daily. 360 capsule 3  . losartan (COZAAR) 50 MG tablet Take 1 tablet (50 mg total) by mouth daily. Please make yearly appt with Dr. Johnsie Cancel for June 2022 for future refills. Thank you 1st attempt 90 tablet 0  . metoprolol tartrate (LOPRESSOR) 25 MG tablet 1 tablet with food    . nitroGLYCERIN (NITROSTAT) 0.4 MG SL tablet Place 1 tablet (0.4 mg total) under the tongue every 5 (five) minutes as needed for chest pain. 25 tablet 3  . omeprazole (PRILOSEC) 40 MG capsule Take 40 mg by mouth daily.    . sildenafil (VIAGRA) 50 MG tablet SMARTSIG:0.5 Tablet(s) By Mouth    . tamsulosin (FLOMAX) 0.4 MG CAPS capsule Take 0.4 mg by mouth daily after supper.    . terbinafine (LAMISIL) 250 MG tablet 1 tablet    . traMADol (ULTRAM) 50 MG tablet Take 1 tablet (50 mg total) by mouth 2 (two) times daily as needed. (Patient not taking: Reported on 03/21/2021) 60 tablet 0   No current facility-administered medications on file prior to visit.    Allergies  Allergen Reactions  . Hydrocodone Other (See Comments)    Throat itchy, weakness- took this during stroke not sure if caused problem Other reaction(s): throat itchy, weakness     Assessment/Plan:  1. Hypertension/CHF - Patient BP in room 154/88 which is above goal of <130/80.  Contributing factors are likely pain and tobacco abuse.    Concern regarding patient's lower extremity swelling and possible decompensation. Patient has normal renal function  however will be receiving contrast dye next Tuesday for bladder scan.  Will hold off on diuretic until we have procedure results.   Patient morning medications causing significant drop in blood pressure throughout the day.  Will d/c HCTZ at this time and switch metoprolol tartrate to low dose metoprolol succinate in the evening for better CHF benefit. Anticipate increasing Toprol. Likely will need to add Entresto +/- lasix and spironolactone in future pending results of urinary CT.    Instructed wife to continue checking patient's BP and monitor for  hypotension.  Will contact patient in one week to check on BP and consider Toprol increase. Patient voiced understanding.   Karren Cobble, PharmD, BCACP, [place], [place] [address] [place], [place] [address] Phone: [phone]; Fax: [phone] 03/23/2021 2:19 PM

## 2021-03-27 ENCOUNTER — Other Ambulatory Visit: Payer: Self-pay | Admitting: Cardiovascular Disease

## 2021-03-27 DIAGNOSIS — I10 Essential (primary) hypertension: Secondary | ICD-10-CM

## 2021-03-27 DIAGNOSIS — I420 Dilated cardiomyopathy: Secondary | ICD-10-CM

## 2021-03-30 ENCOUNTER — Telehealth: Payer: Self-pay | Admitting: Pharmacist

## 2021-03-30 NOTE — Telephone Encounter (Signed)
Called and spoke with [name], patient fiance.  Patient received contrast dye on Tuesday 5/24, has bladder ultrasound scheduled for today  Blood pressure has improved throughout the day  5/22: 131/85, 96/56, 119/59 5/23: 154/87, 107/68, 125/71 5/24: 149/79, 136/83, 137/84 5/25: 173/106, 142/88, 95/59 (in pain today, took pain pill) 5/26: 156/93  Pateint to increase Toprol XL to 25 mg at night to help with morning BP.  Still has LEE but will continue to wait for results of bladder/renal ultrasound before adding lasix

## 2021-04-11 ENCOUNTER — Ambulatory Visit (INDEPENDENT_AMBULATORY_CARE_PROVIDER_SITE_OTHER): Payer: Medicare Other | Admitting: Pharmacist

## 2021-04-11 ENCOUNTER — Telehealth: Payer: Self-pay | Admitting: Pharmacist

## 2021-04-11 ENCOUNTER — Other Ambulatory Visit: Payer: Self-pay

## 2021-04-11 ENCOUNTER — Encounter: Payer: Self-pay | Admitting: Pharmacist

## 2021-04-11 DIAGNOSIS — I10 Essential (primary) hypertension: Secondary | ICD-10-CM

## 2021-04-11 DIAGNOSIS — I420 Dilated cardiomyopathy: Secondary | ICD-10-CM | POA: Diagnosis not present

## 2021-04-11 LAB — BASIC METABOLIC PANEL
BUN/Creatinine Ratio: 21 (ref 10–24)
BUN: 22 mg/dL (ref 8–27)
CO2: 25 mmol/L (ref 20–29)
Calcium: 10.1 mg/dL (ref 8.6–10.2)
Chloride: 100 mmol/L (ref 96–106)
Creatinine, Ser: 1.04 mg/dL (ref 0.76–1.27)
Glucose: 89 mg/dL (ref 65–99)
Potassium: 4.3 mmol/L (ref 3.5–5.2)
Sodium: 139 mmol/L (ref 134–144)
eGFR: 80 mL/min/{1.73_m2} (ref >59)

## 2021-04-11 NOTE — Progress Notes (Signed)
Patient ID: [name]                 DOB: 1956-08-18                      MRN: [id number]     HPI: [name] is a 65 y.o. male referred by [name] to HTN clinic. PMH is significant for CVA, HTN, OSA, CAD, angina, cardiomyopathy (EF 45-50%), stroke, and tobacco abuse.  Patient ambulates with a cane due to lower back pain, balance disorder, and shoulder pain managed by physical medicine and rehab.  Patient referred to HTN clinic after being started on tizandine and his blood pressure dropped to 60/40 and he became lightheaded and "seeing stars."  Tizanidine was discontinued.  At last visit,patient's BP would drop throughout the day so HCTZ was d/c and metoprolol tartrate was switched to succinate and then increased to 25mg  nightly.  Patient was pending bladder scan with contrast so plan was to hold any medications metabolized renally until results came back.  Patient and fiance present today in good spirits.  CT results have still not come back yet but she says they have appointment at [facility] urology later today.  Legs had begun to swell in past week so she has been giving him HCTZ 25mg  every other day which has been helpful.  Was still having occasional daytime hypotension so she had begun alternating Toprol XL 25mg  with 12.5mg .    BP log:  6/7: 119/83 66: 119/82, 87/60, 97/61 6/5: 121/84, 94/66, 93/58 6/4: 135/83, 101/71, 93/60 6/3: 130/78, 109/71 6/2: 92/64, 89/56  Fiance takes his BP in morning and evening before his medications  Current HTN meds: losartan 50mg  daily, Toprol XL 25mg  daily in PM, HCTZ 25mg  every other day  BP goal: <130/80    Wt Readings from Last 3 Encounters:  03/23/21 204 lb 9.6 oz (92.8 kg)  03/21/21 207 lb 3.2 oz (94 kg)  02/23/21 204 lb 3.2 oz (92.6 kg)   BP Readings from Last 3 Encounters:  03/23/21 (!) 154/88  03/21/21 (!) 169/101  02/23/21 131/66   Pulse Readings from Last 3 Encounters:  03/23/21 72  03/21/21 66  02/23/21 69     Renal function: CrCl cannot be calculated (Patient's most recent lab result is older than the maximum 21 days allowed.).  Past Medical History:  Diagnosis Date  . Arthritis   . BPH (benign prostatic hyperplasia)   . CAD (coronary artery disease)    a. s/p prior MI; hx of stent to RCA and LAD;  b. LHC 12/2006:  LM 20%, pLAD 40%, mLAD stent ok, apical LAD 90% (1.5-50mm vessel), oD1 40-50%, AV groove CFX 30%, oOM1 30%, pRCA 30% (multiple), mRCA stent ok with 30-40% ISR, dRCA 30%, dAnt, inf-apical severe HK, EF 40% => med Rx.;  12/2013 Cath: LM nl, LAD 95ost, 11m ISR, 99apex, LCX nl, RCA 40p, 100 ISR, L->R collats, EF 35-40%-->pending CABG  . Chronic back pain    lumbar compression fracture and stenosis  . Clavicle fracture   . Cryptogenic stroke ([place])   . ED (erectile dysfunction)   . GERD (gastroesophageal reflux disease)    occ  . Hypertension    takes Losartan,HCTZ,  and Metoprolol daily  . Ischemic cardiomyopathy    a. Echo 09/2012:  Mod LVH, focal basal hypertrophy, EF 30-35%, mid to dist inf-lat HK, Gr 1 diast dysfn, mild to mod LAE  . Mixed hyperlipidemia    takes Atorvastatin daily  .  Myocardial infarction ([place]) 2015   x2  . Peripheral vascular disease ([place])   . Scaphoid non-union advanced collapse   . Sleep apnea    not using cpap at present had new test and getting new cpap  . Stroke ([place]) 03/2016  . Urinary frequency    takes Flomax daily  . Vitamin B12 deficiency     Current Outpatient Medications on File Prior to Visit  Medication Sig Dispense Refill  . aspirin (ASPIRIN LOW DOSE) 81 MG EC tablet Take 1 tablet (81 mg total) by mouth daily. 90 tablet 3  . atorvastatin (LIPITOR) 80 MG tablet TAKE 1 TABLET BY MOUTH  DAILY 90 tablet 0  . Baclofen 5 MG TABS Take 1 tablet by mouth every evening.    . Bempedoic Acid-Ezetimibe (NEXLIZET) 180-10 MG TABS Take 1 tablet by mouth daily. 90 tablet 3  . fenofibrate (TRICOR) 145 MG tablet TAKE 1 TABLET BY MOUTH  DAILY 90 tablet 0   . icosapent Ethyl (VASCEPA) 1 g capsule Take 2 capsules (2 g total) by mouth 2 (two) times daily. 360 capsule 3  . losartan (COZAAR) 50 MG tablet TAKE 1 TABLET BY MOUTH  DAILY 90 tablet 0  . metoprolol succinate (TOPROL-XL) 25 MG 24 hr tablet TAKE 1 TABLET BY MOUTH  DAILY 90 tablet 0  . nitroGLYCERIN (NITROSTAT) 0.4 MG SL tablet Place 1 tablet (0.4 mg total) under the tongue every 5 (five) minutes as needed for chest pain. 25 tablet 3  . omeprazole (PRILOSEC) 40 MG capsule Take 40 mg by mouth daily.    . tamsulosin (FLOMAX) 0.4 MG CAPS capsule Take 0.4 mg by mouth daily after supper.     No current facility-administered medications on file prior to visit.    Allergies  Allergen Reactions  . Hydrocodone Other (See Comments)    Throat itchy, weakness- took this during stroke not sure if caused problem Other reaction(s): throat itchy, weakness     Assessment/Plan:  1. Hypertension - Patient BP in room 144/82 which is above goal of <130/80 and much higher than patients home readings.  Questioned whether patient's home cuff was accurate.  Will have fiance come in with patient to check BP with cuff here and home cuff.  Since patient home readings still dropping during the day, will reduce patient to Toprol 12.5mg  in evening since he was frequently taking that dose regardless.  Since patient has restarted HCTZ due to lower extremity edema, will recheck BMP today.    [place] patient is receiving results from CT today and BMP, will make medication plan once assessing patient's renal function.  Patient voiced understanding.  Continue losartan 50mg  daily Continue HCTZ 25mg  daily Reduce Toprol XL to 12.5mg  once a day Check BMP Recheck on 6/28 to verify home BP readings   [name], PharmD, BCACP, [place], [place] [address] [place], [place] [address] Phone: [phone]; Fax: [phone] 04/11/2021 11:41 AM

## 2021-04-11 NOTE — Patient Instructions (Addendum)
It was good seeing you today!  We would like your blood pressure to be less than 130/80  We will reduce the metoprolol to 12.5mg  once a day Continue your losartan 50mg  daily Continue your hydrochlorothiazide 25mg  daily  We will check your lab work today and I will give you a call with your results  Please call with any questions!  Karren Cobble, PharmD, BCACP, [place], [place] [address] [place], [place] [address] Phone: [phone]; Fax: [phone] 04/11/2021 10:27 AM

## 2021-04-11 NOTE — Telephone Encounter (Signed)
error 

## 2021-04-17 ENCOUNTER — Other Ambulatory Visit: Payer: Self-pay | Admitting: Urology

## 2021-04-25 NOTE — Patient Instructions (Addendum)
DUE TO COVID-19 ONLY ONE VISITOR IS ALLOWED TO COME WITH YOU AND STAY IN THE WAITING ROOM ONLY DURING PRE OP AND PROCEDURE DAY OF SURGERY. THE 1 VISITOR  MAY VISIT WITH YOU AFTER SURGERY IN YOUR PRIVATE ROOM DURING VISITING HOURS ONLY!  YOU NEED TO HAVE A COVID 19 TEST ON: 05/09/21 @  9:00 AM, THIS TEST MUST BE DONE BEFORE SURGERY,  COVID TESTING SITE [place] [address], IT IS ON THE RIGHT GOING OUT [address] APPROXIMATELY  2 MINUTES PAST [employer] ON THE RIGHT. ONCE YOUR COVID TEST IS COMPLETED,  PLEASE BEGIN THE QUARANTINE INSTRUCTIONS AS OUTLINED IN YOUR HANDOUT.                [name]   Your procedure is scheduled on: 05/10/21   Report to [facility] Main  Entrance   Report to admitting at : 9:30 AM    Call this number if you have problems the morning of surgery [phone]    Remember: Do not eat solid food :After Midnight. Clear liquids until: 8:30 am.  CLEAR LIQUID DIET  Foods Allowed                                                                     Foods Excluded  Coffee and tea, regular and decaf                             liquids that you cannot  Plain Jell-O any favor except red or purple                                           see through such as: Fruit ices (not with fruit pulp)                                     milk, soups, orange juice  Iced Popsicles                                    All solid food Carbonated beverages, regular and diet                                    Cranberry, grape and apple juices Sports drinks like Gatorade Lightly seasoned clear broth or consume(fat free) Sugar, honey syrup  Sample Menu Breakfast                                Lunch                                     Supper Cranberry juice  Beef broth                            Chicken broth Jell-O                                     Grape juice                           Apple juice Coffee or tea                         Jell-O                                      Popsicle                                                Coffee or tea                        Coffee or tea  _____________________________________________________________________  BRUSH YOUR TEETH MORNING OF SURGERY AND RINSE YOUR MOUTH OUT, NO CHEWING GUM CANDY OR MINTS.                               You may not have any metal on your body including hair pins and              piercings  Do not wear jewelry,lotions, powders or perfumes, deodorant             Men may shave face and neck.   Do not bring valuables to the hospital. [place].  Contacts, dentures or bridgework may not be worn into surgery.  Leave suitcase in the car. After surgery it may be brought to your room.     Patients discharged the day of surgery will not be allowed to drive home. IF YOU ARE HAVING SURGERY AND GOING HOME THE SAME DAY, YOU MUST HAVE AN ADULT TO DRIVE YOU HOME AND BE WITH YOU FOR 24 HOURS. YOU MAY GO HOME BY TAXI OR UBER OR ORTHERWISE, BUT AN ADULT MUST ACCOMPANY YOU HOME AND STAY WITH YOU FOR 24 HOURS.  Name and phone number of your driver:  Special Instructions: N/A              Please read over the following fact sheets you were given: _____________________________________________________________________  PLEASE BRING CPAP MASK [place]. DEVICE WILL BE PROVIDED!            [facility] - Preparing for Surgery Before surgery, you can play an important role.  Because skin is not sterile, your skin needs to be as free of germs as possible.  You can reduce the number of germs on your skin by washing with CHG (chlorahexidine gluconate) soap before surgery.  CHG is an antiseptic cleaner which kills germs and bonds with the skin to continue killing germs even after washing.  Please DO NOT use if you have an allergy to CHG or antibacterial soaps.  If your skin becomes reddened/irritated stop using the  CHG and inform your nurse when you arrive at Short Stay. Do not shave (including legs and underarms) for at least 48 hours prior to the first CHG shower.  You may shave your face/neck. Please follow these instructions carefully:  1.  Shower with CHG Soap the night before surgery and the  morning of Surgery.  2.  If you choose to wash your hair, wash your hair first as usual with your  normal  shampoo.  3.  After you shampoo, rinse your hair and body thoroughly to remove the  shampoo.                           4.  Use CHG as you would any other liquid soap.  You can apply chg directly  to the skin and wash                       Gently with a scrungie or clean washcloth.  5.  Apply the CHG Soap to your body ONLY FROM THE NECK DOWN.   Do not use on face/ open                           Wound or open sores. Avoid contact with eyes, ears mouth and genitals (private parts).                       Wash face,  Genitals (private parts) with your normal soap.             6.  Wash thoroughly, paying special attention to the area where your surgery  will be performed.  7.  Thoroughly rinse your body with warm water from the neck down.  8.  DO NOT shower/wash with your normal soap after using and rinsing off  the CHG Soap.                9.  Pat yourself dry with a clean towel.            10.  Wear clean pajamas.            11.  Place clean sheets on your bed the night of your first shower and do not  sleep with pets. Day of Surgery : Do not apply any lotions/deodorants the morning of surgery.  Please wear clean clothes to the hospital/surgery center.  FAILURE TO FOLLOW THESE INSTRUCTIONS MAY RESULT IN THE CANCELLATION OF YOUR SURGERY PATIENT SIGNATURE_________________________________  NURSE SIGNATURE__________________________________  ________________________________________________________________________

## 2021-04-26 ENCOUNTER — Encounter (HOSPITAL_COMMUNITY): Payer: Self-pay

## 2021-04-26 ENCOUNTER — Other Ambulatory Visit: Payer: Self-pay

## 2021-04-26 ENCOUNTER — Encounter (HOSPITAL_COMMUNITY)
Admission: RE | Admit: 2021-04-26 | Discharge: 2021-04-26 | Disposition: A | Discharge status: Home / Self Care | Payer: Medicare Other | Source: Ambulatory Visit | Attending: Urology | Admitting: Urology

## 2021-04-26 DIAGNOSIS — Z01818 Encounter for other preprocedural examination: Secondary | ICD-10-CM | POA: Diagnosis not present

## 2021-04-26 LAB — CBC
HCT: 48.5 % (ref 39.0–52.0)
Hemoglobin: 15.6 g/dL (ref 13.0–17.0)
MCH: 29.4 pg (ref 26.0–34.0)
MCHC: 32.2 g/dL (ref 30.0–36.0)
MCV: 91.5 fL (ref 80.0–100.0)
Platelets: 155 10*3/uL (ref 150–400)
RBC: 5.3 MIL/uL (ref 4.22–5.81)
RDW: 13.6 % (ref 11.5–15.5)
WBC: 7.7 10*3/uL (ref 4.0–10.5)
nRBC: 0 % (ref 0.0–0.2)

## 2021-04-26 LAB — BASIC METABOLIC PANEL
Anion gap: 9 (ref 5–15)
BUN: 22 mg/dL (ref 8–23)
CO2: 26 mmol/L (ref 22–32)
Calcium: 9.5 mg/dL (ref 8.9–10.3)
Chloride: 103 mmol/L (ref 98–111)
Creatinine, Ser: 0.87 mg/dL (ref 0.61–1.24)
GFR, Estimated: >60 mL/min (ref >60)
Glucose, Bld: 103 mg/dL — ABNORMAL HIGH (ref 70–99)
Potassium: 4 mmol/L (ref 3.5–5.1)
Sodium: 138 mmol/L (ref 135–145)

## 2021-04-26 LAB — TYPE AND SCREEN
ABO/RH(D): A POS
Antibody Screen: NEGATIVE

## 2021-04-26 LAB — PROTIME-INR
INR: 1.1 (ref 0.8–1.2)
Prothrombin Time: 14.6 seconds (ref 11.4–15.2)

## 2021-04-26 NOTE — Progress Notes (Signed)
COVID Vaccine Completed: Yes Date COVID Vaccine completed: 02/23/20 COVID vaccine manufacturer:   Moderna     PCP -  Clyde Lundborg: PAC. Cardiologist - Dr. Jenkins Rouge  Chest x-ray -  EKG -  Stress Test -  ECHO - 02/24/21 Cardiac Cath -  Pacemaker/ICD device last checked:  Sleep Study - Yes CPAP - Yes  Fasting Blood Sugar -  Checks Blood Sugar _____ times a day  Blood Thinner Instructions: Aspirin Instructions: Last Dose:  Anesthesia review: Hx: HTN,Stroke,CAD,OSA(CPAP)  Patient denies shortness of breath, fever, cough and chest pain at PAT appointment   Patient verbalized understanding of instructions that were given to them at the PAT appointment. Patient was also instructed that they will need to review over the PAT instructions again at home before surgery.

## 2021-05-02 ENCOUNTER — Other Ambulatory Visit: Payer: Self-pay

## 2021-05-02 ENCOUNTER — Ambulatory Visit (INDEPENDENT_AMBULATORY_CARE_PROVIDER_SITE_OTHER): Payer: Medicare Other | Admitting: Pharmacist

## 2021-05-02 ENCOUNTER — Encounter: Payer: Self-pay | Admitting: Physical Medicine & Rehabilitation

## 2021-05-02 ENCOUNTER — Encounter: Payer: Medicare Other | Attending: Physical Medicine & Rehabilitation | Admitting: Physical Medicine & Rehabilitation

## 2021-05-02 VITALS — BP 120/70 | HR 70

## 2021-05-02 VITALS — BP 121/81 | HR 70 | Temp 97.8°F | Ht 68.0 in | Wt 202.8 lb

## 2021-05-02 DIAGNOSIS — G463 Brain stem stroke syndrome: Secondary | ICD-10-CM | POA: Insufficient documentation

## 2021-05-02 DIAGNOSIS — M47817 Spondylosis without myelopathy or radiculopathy, lumbosacral region: Secondary | ICD-10-CM | POA: Diagnosis not present

## 2021-05-02 DIAGNOSIS — I10 Essential (primary) hypertension: Secondary | ICD-10-CM

## 2021-05-02 NOTE — Progress Notes (Signed)
Patient ID: TORIE PRIEBE                 DOB: [birth year]                      MRN: [id number]     HPI: [name] is a 65 y.o. male referred by Dr. Johnsie Cancel to HTN clinic. PMH is significant for CVA, HTN, OSA, CAD, angina, cardiomyopathy (EF 45-50%), stroke, and tobacco abuse.  Patient ambulates with a cane due to lower back pain, balance disorder, and shoulder pain managed by physical medicine and rehab.  Patient referred to HTN clinic after being started on tizandine and his blood pressure dropped to 60/40 and he became lightheaded and "seeing stars."  Tizanidine was discontinued.  At a previous visit, patient reported his BP would drop throughout the day so HCTZ was d/c and metoprolol tartrate was switched to succinate and then increased to 25mg  nightly.  Patient was pending bladder scan with contrast so plan was to hold any medications metabolized renally until results came back. At last visit, fiance reported pt legs swelling so she resumed giving him HCTZ every other day. She was also giving him metoprolol succinate 25mg  alternating with 12.5mg .   Patient and fiance present today in good spirits.  CT results have still not come back yet but she says they have appointment at [facility] urology later today.  Legs had begun to swell in past week so she has been giving him HCTZ 25mg  every other day which has been helpful.  Was still having occasional daytime hypotension so she had begun alternating Toprol XL 25mg  with 12.5mg . He was advised to continue losartan 50mg  daily, resume HCTZ 25mg  daily and reduce Toprol XL to 12.5mg  once a day. Fiance was asked to bring in pt home BP cuff to verify accuracy. Repeat BMP was WNL.  Patient presents today accompanied by his fiance. He denies dizziness, lightheadedness, headache, blurred vision, SOB. Mild swelling in left leg which they state is much improved. Fiance thinks BP has leveled out in the last several days. Not dropping as much at night. Home  readings range mainly in the low 782'N-FAO teens systolic. A few in the 120's and one in the 130s and one in the 140's in the AM. He did have a few readings in the 13'Y systolic. HR 60-90.  They did bring in his home blood pressure cuff which was found to be accurate.   Fiance takes his BP in morning and evening before his medications  Current HTN meds: losartan 50mg  daily, Toprol XL 12.5mg  daily in PM, HCTZ 25mg  daily BP goal: <130/80    Wt Readings from Last 3 Encounters:  04/11/21 203 lb 6.4 oz (92.3 kg)  03/23/21 204 lb 9.6 oz (92.8 kg)  03/21/21 207 lb 3.2 oz (94 kg)   BP Readings from Last 3 Encounters:  04/26/21 (!) 145/87  04/11/21 (!) 144/82  03/23/21 (!) 154/88   Pulse Readings from Last 3 Encounters:  04/26/21 71  04/11/21 72  03/23/21 72    Renal function: CrCl cannot be calculated (Unknown ideal weight.).  Past Medical History:  Diagnosis Date   Arthritis    BPH (benign prostatic hyperplasia)    CAD (coronary artery disease)    a. s/p prior MI; hx of stent to RCA and LAD;  b. LHC 12/2006:  LM 20%, pLAD 40%, mLAD stent ok, apical LAD 90% (1.5-52mm vessel), oD1 40-50%, AV groove CFX 30%, oOM1 30%,  pRCA 30% (multiple), mRCA stent ok with 30-40% ISR, dRCA 30%, dAnt, inf-apical severe HK, EF 40% => med Rx.;  12/2013 Cath: LM nl, LAD 95ost, 76m ISR, 99apex, LCX nl, RCA 40p, 100 ISR, L->R collats, EF 35-40%-->pending CABG   Chronic back pain    lumbar compression fracture and stenosis   Clavicle fracture    Cryptogenic stroke [facility])    ED (erectile dysfunction)    GERD (gastroesophageal reflux disease)    occ   Hypertension    takes Losartan,HCTZ,  and Metoprolol daily   Ischemic cardiomyopathy    a. Echo 09/2012:  Mod LVH, focal basal hypertrophy, EF 30-35%, mid to dist inf-lat HK, Gr 1 diast dysfn, mild to mod LAE   Mixed hyperlipidemia    takes Atorvastatin daily   Myocardial infarction [facility]) 2015   x2   Peripheral vascular disease ([place])    Scaphoid non-union  advanced collapse    Sleep apnea    not using cpap at present had new test and getting new cpap   Stroke ([place]) 03/2016   Urinary frequency    takes Flomax daily   Vitamin B12 deficiency     Current Outpatient Medications on File Prior to Visit  Medication Sig Dispense Refill   aspirin (ASPIRIN LOW DOSE) 81 MG EC tablet Take 1 tablet (81 mg total) by mouth daily. (Patient taking differently: Take 81 mg by mouth in the morning.) 90 tablet 3   atorvastatin (LIPITOR) 80 MG tablet TAKE 1 TABLET BY MOUTH  DAILY (Patient taking differently: Take 80 mg by mouth every evening.) 90 tablet 0   Baclofen 5 MG TABS Take 5 mg by mouth at bedtime.     Bempedoic Acid-Ezetimibe (NEXLIZET) 180-10 MG TABS Take 1 tablet by mouth daily. (Patient taking differently: Take 1 tablet by mouth in the morning.) 90 tablet 3   docusate sodium (COLACE) 100 MG capsule Take 100 mg by mouth in the morning and at bedtime.     fenofibrate (TRICOR) 145 MG tablet TAKE 1 TABLET BY MOUTH  DAILY (Patient taking differently: Take 145 mg by mouth in the morning.) 90 tablet 0   hydrochlorothiazide (HYDRODIURIL) 25 MG tablet Take 25 mg by mouth every other day. In the morning     icosapent Ethyl (VASCEPA) 1 g capsule Take 2 capsules (2 g total) by mouth 2 (two) times daily. 360 capsule 3   losartan (COZAAR) 50 MG tablet TAKE 1 TABLET BY MOUTH  DAILY (Patient taking differently: Take 50 mg by mouth in the morning.) 90 tablet 0   metoprolol succinate (TOPROL-XL) 25 MG 24 hr tablet Take 12.5 mg by mouth at bedtime. 90 tablet 0   nitroGLYCERIN (NITROSTAT) 0.4 MG SL tablet Place 1 tablet (0.4 mg total) under the tongue every 5 (five) minutes as needed for chest pain. (Patient taking differently: Place 0.4 mg under the tongue every 5 (five) minutes x 3 doses as needed for chest pain.) 25 tablet 3   omeprazole (PRILOSEC) 40 MG capsule Take 40 mg by mouth in the morning.     tamsulosin (FLOMAX) 0.4 MG CAPS capsule Take 0.4 mg by mouth daily after  supper.     traMADol (ULTRAM) 50 MG tablet Take 50 mg by mouth every 6 (six) hours as needed (pain).     vitamin B-12 (CYANOCOBALAMIN) 500 MCG tablet Take 500 mcg by mouth in the morning.     No current facility-administered medications on file prior to visit.    Allergies  Allergen Reactions  Hydrocodone Other (See Comments)    Throat itchy, weakness- took this during stroke not sure if caused problem  throat itchy, weakness     Assessment/Plan:  1. Hypertension - Patient BP in room 120/70 which is at goal of <130/80. Patient does not have any symptoms of hypotension. However he does still have systolic readings in the 21'J and low 100's. Will decrease losartan to 25mg  daily and follow blood pressure. Continue HCTZ 25mg  daily and metoprolol succinate 12.5mg  daily. I will call patient in 2 weeks to see how blood pressure is doing.  Thank you,  Ramond Dial, Pharm.D, BCPS, CPP [place]  [address] [place],  [address]  Phone: [phone]; Fax: [phone]   05/02/2021 8:00 AM

## 2021-05-02 NOTE — Patient Instructions (Signed)
[place] for partial squats, Exercise machine, stationary bike  May do bike every day Weightlifting 2-3 days a week

## 2021-05-02 NOTE — Progress Notes (Signed)
Anesthesia Chart Review   Case: [id number] Date/Time: 05/10/21 1115   Procedure: TRANSURETHRAL RESECTION OF THE PROSTATE (TURP)   Anesthesia type: General   Pre-op diagnosis: BENIGN PROSTATE HYPERPLASIA   Location: [place] / [facility]   Surgeons: Lucas Mallow, MD       DISCUSSION:65 y.o. every day smoker with h/o HTN, GERD, ischemic cardiomyopathy, CAD, sleep apnea, PVD, stroke 2017, BPH scheduled for above procedure 05/10/2021 with Dr. Link Snuffer.   Per cardiology preoperative evaluation 05/03/2021, " Chart reviewed as part of pre-operative protocol coverage. Patient was contacted 05/03/2021 in reference to pre-operative risk assessment for pending surgery as outlined below.  [name] was last seen on 04/27/20 by Dr. Johnsie Cancel.  Since that day, [name] has done well. He has since followed in HTN clinic and has been working on medication titration. He can complete more than 4.0 METS without angina (strenuous yard work, climbs stairs). As part of his hematuria workup, CT imaging showed possible LV aneurysm. Follow up echocardiogram with suspicion for LV thrombus. However, cardiac MR on 03/02/21 did not confirm presence of thrombus; he is therefore not anticoagulated.   As part of our protocol, patients are generally seen within 1 year to complete preop clearance. However, due to scheduling availability, we are unable to see him until late July (appt has been made). Since procedure is urgent due to possible cancer, I have discussed the case with DOD Dr. Rayann Heman who agrees that the patient can proceed with surgery. The patient understands he is at higher risk for cardiac complications. I do not think additional cardiac testing at this time would mitigate this risk. He agrees to proceed.   Therefore, based on ACC/AHA guidelines, the patient would be at acceptable risk for the planned procedure without further cardiovascular testing."  Anticipate pt can proceed with planned  procedure barring acute status change.   VS: BP (!) 145/87   Pulse 71   Temp 36.8 C (Oral)   Ht 5\' 8"  (1.727 m)   SpO2 96%   BMI 30.93 kg/m   PROVIDERS: Heywood Bene, PA-C is PCP   Jenkins Rouge, MD is Cardiologist  LABS: Labs reviewed: Acceptable for surgery. (all labs ordered are listed, but only abnormal results are displayed)  Labs Reviewed  BASIC METABOLIC PANEL - Abnormal; Notable for the following components:      Result Value   Glucose, Bld 103 (*)    All other components within normal limits  CBC  PROTIME-INR  TYPE AND SCREEN     IMAGES:   EKG: 04/26/21 Rate 67 bpm  Normal sinus rhythm Left axis deviation Anteroseptal infarct , age undetermined T wave abnormality, consider lateral ischemia Abnormal ECG No significant change since last tracing  CV: Echo 02/24/21  1. Left ventricular ejection fraction, by estimation, is 45 to 50%. The  left ventricle has mildly decreased function. The left ventricle  demonstrates regional wall motion abnormalities (LAD territory). Left  ventricular diastolic parameters are  consistent with Grade II diastolic dysfunction (pseudonormalization).  There is a small echodensity seen n the LV apex, only seen in the contrast  A4c views. This could be consistent with LV thrombus.   2. Right ventricular systolic function is normal. The right ventricular  size is normal.   3. The mitral valve is grossly normal. No evidence of mitral valve  regurgitation. No evidence of mitral stenosis.   4. The aortic valve is tricuspid. Aortic valve regurgitation is not  visualized.  No aortic stenosis is present.  Past Medical History:  Diagnosis Date   Arthritis    BPH (benign prostatic hyperplasia)    CAD (coronary artery disease)    a. s/p prior MI; hx of stent to RCA and LAD;  b. LHC 12/2006:  LM 20%, pLAD 40%, mLAD stent ok, apical LAD 90% (1.5-12mm vessel), oD1 40-50%, AV groove CFX 30%, oOM1 30%, pRCA 30% (multiple), mRCA stent ok  with 30-40% ISR, dRCA 30%, dAnt, inf-apical severe HK, EF 40% => med Rx.;  12/2013 Cath: LM nl, LAD 95ost, 101m ISR, 99apex, LCX nl, RCA 40p, 100 ISR, L->R collats, EF 35-40%-->pending CABG   Chronic back pain    lumbar compression fracture and stenosis   Clavicle fracture    Cryptogenic stroke [facility])    ED (erectile dysfunction)    GERD (gastroesophageal reflux disease)    occ   Hypertension    takes Losartan,HCTZ,  and Metoprolol daily   Ischemic cardiomyopathy    a. Echo 09/2012:  Mod LVH, focal basal hypertrophy, EF 30-35%, mid to dist inf-lat HK, Gr 1 diast dysfn, mild to mod LAE   Mixed hyperlipidemia    takes Atorvastatin daily   Myocardial infarction [facility]) 2015   x2   Peripheral vascular disease ([place])    Scaphoid non-union advanced collapse    Sleep apnea    not using cpap at present had new test and getting new cpap   Stroke ([place]) 03/2016   Urinary frequency    takes Flomax daily   Vitamin B12 deficiency     Past Surgical History:  Procedure Laterality Date   ANTERIOR CERVICAL DECOMP/DISCECTOMY FUSION N/A 06/14/2016   Procedure: ANTERIOR CERVICAL DECOMPRESSION/DISCECTOMY FUSION C4 - C5, CERVICAL 6-C7 2 LEVEL;  Surgeon: Melina Schools, MD;  Location: [place];  Service: Orthopedics;  Laterality: N/A;   BIOPSY  03/03/2020   Procedure: BIOPSY;  Surgeon: Otis Brace, MD;  Location: [facility];  Service: Gastroenterology;;   CARDIAC CATHETERIZATION  06/27/2001; 12/13/2006; 02/20/2010   COLONOSCOPY     COLONOSCOPY WITH PROPOFOL N/A 03/03/2020   Procedure: COLONOSCOPY WITH PROPOFOL;  Surgeon: Otis Brace, MD;  Location: [facility];  Service: Gastroenterology;  Laterality: N/A;   CORONARY ANGIOPLASTY WITH STENT PLACEMENT  05/22/2001   PCI - RCA - LAD   CORONARY ANGIOPLASTY WITH STENT PLACEMENT  11/11/2006   PCI - BMS - LAD   CORONARY ANGIOPLASTY WITH STENT PLACEMENT  11/15/2006   PCI - BMS - RCA   CORONARY ANGIOPLASTY WITH STENT PLACEMENT  03/16/2010   CORONARY ARTERY  BYPASS GRAFT N/A 01/01/2014   Procedure: Coronary artery bypass graft times two using left internal mammary artery and left leg greater saphenous vein harvested endoscopically.;  Surgeon: Gaye Pollack, MD;  Location: [place];  Service: Open Heart Surgery;  Laterality: N/A;   EP IMPLANTABLE DEVICE N/A 04/05/2016   Procedure: Loop Recorder Insertion;  Surgeon: Evans Lance, MD;  Location: [place] CV LAB;  Service: Cardiovascular;  Laterality: N/A;   ESOPHAGOGASTRODUODENOSCOPY (EGD) WITH PROPOFOL N/A 03/03/2020   Procedure: ESOPHAGOGASTRODUODENOSCOPY (EGD) WITH PROPOFOL;  Surgeon: Otis Brace, MD;  [facility];  Service: Gastroenterology;  Laterality: N/A;   FOOT SURGERY Right    achilles tendon reattached   HAND SURGERY Right    screws   INTRAOPERATIVE TRANSESOPHAGEAL ECHOCARDIOGRAM N/A 01/01/2014   Procedure: INTRAOPERATIVE TRANSESOPHAGEAL ECHOCARDIOGRAM;  Surgeon: Gaye Pollack, MD;  [facility];  Service: Open Heart Surgery;  Laterality: N/A;   KNEE ARTHROSCOPY  left knee x 1, right knee x 2   KYPHOPLASTY N/A 12/06/2016   Procedure: KYPHOPLASTY L4 ;  Surgeon: Melina Schools, MD;  Location: [place];  Service: Orthopedics;  Laterality: N/A;  Requests for 3 hours   LEFT HEART CATHETERIZATION WITH CORONARY ANGIOGRAM N/A 12/21/2013   Procedure: LEFT HEART CATHETERIZATION WITH CORONARY ANGIOGRAM;  Surgeon: Burnell Blanks, MD;  Location: [facility] CATH LAB;  Service: Cardiovascular;  Laterality: N/A;   LUMBAR LAMINECTOMY/DECOMPRESSION MICRODISCECTOMY Right 12/06/2016   Procedure: L4-S1 decompression right side ;  Surgeon: Melina Schools, MD;  Location: [place];  Service: Orthopedics;  Laterality: Right;   OPEN REDUCTION INTERNAL FIXATION (ORIF) SCAPHOID WITH ILIAC CREST BONE GRAFT  10/15/2012   Procedure: OPEN REDUCTION INTERNAL FIXATION (ORIF) SCAPHOID WITH ILIAC CREST BONE GRAFT;  Surgeon: Schuyler Amor, MD;  Location: [place];  Service: Orthopedics;   Laterality: Right;  No iliac creast bone graft taken, used graft from radius   POLYPECTOMY  03/03/2020   Procedure: POLYPECTOMY;  Surgeon: Otis Brace, MD;  [facility];  Service: Gastroenterology;;    MEDICATIONS:  aspirin (ASPIRIN LOW DOSE) 81 MG EC tablet   atorvastatin (LIPITOR) 80 MG tablet   Baclofen 5 MG TABS   Bempedoic Acid-Ezetimibe (NEXLIZET) 180-10 MG TABS   docusate sodium (COLACE) 100 MG capsule   fenofibrate (TRICOR) 145 MG tablet   hydrochlorothiazide (HYDRODIURIL) 25 MG tablet   icosapent Ethyl (VASCEPA) 1 g capsule   losartan (COZAAR) 50 MG tablet   metoprolol succinate (TOPROL-XL) 25 MG 24 hr tablet   nitroGLYCERIN (NITROSTAT) 0.4 MG SL tablet   omeprazole (PRILOSEC) 40 MG capsule   tamsulosin (FLOMAX) 0.4 MG CAPS capsule   traMADol (ULTRAM) 50 MG tablet   vitamin B-12 (CYANOCOBALAMIN) 500 MCG tablet   No current facility-administered medications for this encounter.     Konrad Felix, PA-C [facility] [phone]

## 2021-05-02 NOTE — Patient Instructions (Addendum)
Please decrease losartan to 25mg  (1/2 tablet daily) Continue checking blood pressure Continue Toprol XL 12.5mg  daily in PM and HCTZ 25mg  daily I will call you in 2 weeks to see how blood pressure is doing Call me at [phone] with any questions

## 2021-05-02 NOTE — Progress Notes (Signed)
Subjective:    Patient ID: [name], male    DOB: [birth year], 65 y.o.   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  Both these problems have resolved.  The patient states that the back pain improved after the medial branch blocks performed February 23, 2021, left L3-L4-L5. Patient would like to start exercising once again we discussed which exercises would be helpful such as LAT pull downs, stationary bicycling, partial squats holding onto counter Pain Inventory Average Pain 3 Pain Right Now 3 My pain is intermittent  In the last 24 hours, has pain interfered with the following? General activity 6 Relation with others 2 Enjoyment of life 7 What TIME of day is your pain at its worst? evening Sleep (in general) Fair  Pain is worse with: walking, bending, and standing Pain improves with: rest, heat/ice, and medication Relief from Meds: 1  Family History  Problem Relation Age of Onset   Heart disease Father    Heart disease Maternal Grandmother    Heart disease Maternal Grandfather    Heart disease Paternal Grandmother    Heart disease Paternal Grandfather    Social History   Socioeconomic History   Marital status: Significant Other    Spouse name: [name]   Number of children: 2   Years of education: 12   Highest education level: Not on file  Occupational History   Occupation: Disabled   Tobacco Use   Smoking status: Every Day    Packs/day: 1.00    Years: 20.00    Pack years: 20.00    Types: Cigarettes   Smokeless tobacco: Never  Vaping Use   Vaping Use: Never used  Substance and Sexual Activity   Alcohol use: No    Alcohol/week: 0.0 standard drinks    Comment: none in about 3 years or more    Drug use: No   Sexual activity: Not on file  Other Topics Concern   Not on file  Social History  Narrative   Lives with [name]   Caffeine use: Drinks coffee/tea/soda (2 cups coffee/morning) + at least 2 sodas per day    Social Determinants of Health   Financial Resource Strain: Not on file  Food Insecurity: Not on file  Transportation Needs: Not on file  Physical Activity: Not on file  Stress: Not on file  Social Connections: Not on file   Past Surgical History:  Procedure Laterality Date   ANTERIOR CERVICAL DECOMP/DISCECTOMY FUSION N/A 06/14/2016   Procedure: ANTERIOR CERVICAL DECOMPRESSION/DISCECTOMY FUSION C4 - C5, CERVICAL 6-C7 2 LEVEL;  Surgeon: Melina Schools, MD;  Location: [place];  Service: Orthopedics;  Laterality: N/A;   BIOPSY  03/03/2020   Procedure: BIOPSY;  Surgeon: Otis Brace, MD;  Location: [facility];  Service: Gastroenterology;;   CARDIAC CATHETERIZATION  06/27/2001; 12/13/2006; 02/20/2010   COLONOSCOPY     COLONOSCOPY WITH PROPOFOL N/A 03/03/2020   Procedure: COLONOSCOPY WITH PROPOFOL;  Surgeon: Otis Brace, MD;  Location: [facility];  Service: Gastroenterology;  Laterality: N/A;   CORONARY ANGIOPLASTY WITH STENT PLACEMENT  05/22/2001   PCI - RCA - LAD   CORONARY ANGIOPLASTY WITH STENT PLACEMENT  11/11/2006   PCI - BMS - LAD   CORONARY ANGIOPLASTY WITH STENT PLACEMENT  11/15/2006   PCI - BMS - RCA   CORONARY ANGIOPLASTY WITH STENT PLACEMENT  03/16/2010  CORONARY ARTERY BYPASS GRAFT N/A 01/01/2014   Procedure: Coronary artery bypass graft times two using left internal mammary artery and left leg greater saphenous vein harvested endoscopically.;  Surgeon: Gaye Pollack, MD;  [facility];  Service: Open Heart Surgery;  Laterality: N/A;   EP IMPLANTABLE DEVICE N/A 04/05/2016   Procedure: Loop Recorder Insertion;  Surgeon: Evans Lance, MD;  Location: [place] [facility];  Service: Cardiovascular;  Laterality: N/A;   ESOPHAGOGASTRODUODENOSCOPY (EGD) WITH PROPOFOL N/A 03/03/2020   Procedure: ESOPHAGOGASTRODUODENOSCOPY (EGD) WITH PROPOFOL;  Surgeon:  Otis Brace, MD;  [facility];  Service: Gastroenterology;  Laterality: N/A;   FOOT SURGERY Right    achilles tendon reattached   HAND SURGERY Right    screws   INTRAOPERATIVE TRANSESOPHAGEAL ECHOCARDIOGRAM N/A 01/01/2014   Procedure: INTRAOPERATIVE TRANSESOPHAGEAL ECHOCARDIOGRAM;  Surgeon: Gaye Pollack, MD;  Location: [facility] OR;  Service: Open Heart Surgery;  Laterality: N/A;   KNEE ARTHROSCOPY     left knee x 1, right knee x 2   KYPHOPLASTY N/A 12/06/2016   Procedure: KYPHOPLASTY L4 ;  Surgeon: Melina Schools, MD;  Location: [place];  Service: Orthopedics;  Laterality: N/A;  Requests for 3 hours   LEFT HEART CATHETERIZATION WITH CORONARY ANGIOGRAM N/A 12/21/2013   Procedure: LEFT HEART CATHETERIZATION WITH CORONARY ANGIOGRAM;  Surgeon: Burnell Blanks, MD;  Location: [facility] CATH LAB;  Service: Cardiovascular;  Laterality: N/A;   LUMBAR LAMINECTOMY/DECOMPRESSION MICRODISCECTOMY Right 12/06/2016   Procedure: L4-S1 decompression right side ;  Surgeon: Melina Schools, MD;  Location: [place];  Service: Orthopedics;  Laterality: Right;   OPEN REDUCTION INTERNAL FIXATION (ORIF) SCAPHOID WITH ILIAC CREST BONE GRAFT  10/15/2012   Procedure: OPEN REDUCTION INTERNAL FIXATION (ORIF) SCAPHOID WITH ILIAC CREST BONE GRAFT;  Surgeon: Schuyler Amor, MD;  Location: [place];  Service: Orthopedics;  Laterality: Right;  No iliac creast bone graft taken, used graft from radius   POLYPECTOMY  03/03/2020   Procedure: POLYPECTOMY;  Surgeon: Otis Brace, MD;  [facility];  Service: Gastroenterology;;   Past Surgical History:  Procedure Laterality Date   ANTERIOR CERVICAL DECOMP/DISCECTOMY FUSION N/A 06/14/2016   Procedure: ANTERIOR CERVICAL DECOMPRESSION/DISCECTOMY FUSION C4 - C5, CERVICAL 6-C7 2 LEVEL;  Surgeon: Melina Schools, MD;  Location: [place];  Service: Orthopedics;  Laterality: N/A;   BIOPSY  03/03/2020   Procedure: BIOPSY;  Surgeon: Otis Brace, MD;   [facility];  Service: Gastroenterology;;   CARDIAC CATHETERIZATION  06/27/2001; 12/13/2006; 02/20/2010   COLONOSCOPY     COLONOSCOPY WITH PROPOFOL N/A 03/03/2020   Procedure: COLONOSCOPY WITH PROPOFOL;  Surgeon: Otis Brace, MD;  [facility];  Service: Gastroenterology;  Laterality: N/A;   CORONARY ANGIOPLASTY WITH STENT PLACEMENT  05/22/2001   PCI - RCA - LAD   CORONARY ANGIOPLASTY WITH STENT PLACEMENT  11/11/2006   PCI - BMS - LAD   CORONARY ANGIOPLASTY WITH STENT PLACEMENT  11/15/2006   PCI - BMS - RCA   CORONARY ANGIOPLASTY WITH STENT PLACEMENT  03/16/2010   CORONARY ARTERY BYPASS GRAFT N/A 01/01/2014   Procedure: Coronary artery bypass graft times two using left internal mammary artery and left leg greater saphenous vein harvested endoscopically.;  Surgeon: Gaye Pollack, MD;  Location: [place];  Service: Open Heart Surgery;  Laterality: N/A;   EP IMPLANTABLE DEVICE N/A 04/05/2016   Procedure: Loop Recorder Insertion;  Surgeon: Evans Lance, MD;  Location: [place] [facility];  Service: Cardiovascular;  Laterality: N/A;   ESOPHAGOGASTRODUODENOSCOPY (EGD)  WITH PROPOFOL N/A 03/03/2020   Procedure: ESOPHAGOGASTRODUODENOSCOPY (EGD) WITH PROPOFOL;  Surgeon: Otis Brace, MD;  [facility];  Service: Gastroenterology;  Laterality: N/A;   FOOT SURGERY Right    achilles tendon reattached   HAND SURGERY Right    screws   INTRAOPERATIVE TRANSESOPHAGEAL ECHOCARDIOGRAM N/A 01/01/2014   Procedure: INTRAOPERATIVE TRANSESOPHAGEAL ECHOCARDIOGRAM;  Surgeon: Gaye Pollack, MD;  Location: [facility] OR;  Service: Open Heart Surgery;  Laterality: N/A;   KNEE ARTHROSCOPY     left knee x 1, right knee x 2   KYPHOPLASTY N/A 12/06/2016   Procedure: KYPHOPLASTY L4 ;  Surgeon: Melina Schools, MD;  Location: [place];  Service: Orthopedics;  Laterality: N/A;  Requests for 3 hours   LEFT HEART CATHETERIZATION WITH CORONARY ANGIOGRAM N/A 12/21/2013   Procedure: LEFT HEART CATHETERIZATION WITH  CORONARY ANGIOGRAM;  Surgeon: Burnell Blanks, MD;  Location: [facility] CATH LAB;  Service: Cardiovascular;  Laterality: N/A;   LUMBAR LAMINECTOMY/DECOMPRESSION MICRODISCECTOMY Right 12/06/2016   Procedure: L4-S1 decompression right side ;  Surgeon: Melina Schools, MD;  Location: [place];  Service: Orthopedics;  Laterality: Right;   OPEN REDUCTION INTERNAL FIXATION (ORIF) SCAPHOID WITH ILIAC CREST BONE GRAFT  10/15/2012   Procedure: OPEN REDUCTION INTERNAL FIXATION (ORIF) SCAPHOID WITH ILIAC CREST BONE GRAFT;  Surgeon: Schuyler Amor, MD;  Location: [place];  Service: Orthopedics;  Laterality: Right;  No iliac creast bone graft taken, used graft from radius   POLYPECTOMY  03/03/2020   Procedure: POLYPECTOMY;  Surgeon: Otis Brace, MD;  [facility];  Service: Gastroenterology;;   Past Medical History:  Diagnosis Date   Arthritis    BPH (benign prostatic hyperplasia)    CAD (coronary artery disease)    a. s/p prior MI; hx of stent to RCA and LAD;  b. LHC 12/2006:  LM 20%, pLAD 40%, mLAD stent ok, apical LAD 90% (1.5-25mm vessel), oD1 40-50%, AV groove CFX 30%, oOM1 30%, pRCA 30% (multiple), mRCA stent ok with 30-40% ISR, dRCA 30%, dAnt, inf-apical severe HK, EF 40% => med Rx.;  12/2013 Cath: LM nl, LAD 95ost, 65m ISR, 99apex, LCX nl, RCA 40p, 100 ISR, L->R collats, EF 35-40%-->pending CABG   Chronic back pain    lumbar compression fracture and stenosis   Clavicle fracture    Cryptogenic stroke [facility])    ED (erectile dysfunction)    GERD (gastroesophageal reflux disease)    occ   Hypertension    takes Losartan,HCTZ,  and Metoprolol daily   Ischemic cardiomyopathy    a. Echo 09/2012:  Mod LVH, focal basal hypertrophy, EF 30-35%, mid to dist inf-lat HK, Gr 1 diast dysfn, mild to mod LAE   Mixed hyperlipidemia    takes Atorvastatin daily   Myocardial infarction [facility]) 2015   x2   Peripheral vascular disease ([place])    Scaphoid non-union advanced collapse    Sleep  apnea    not using cpap at present had new test and getting new cpap   Stroke ([place]) 03/2016   Urinary frequency    takes Flomax daily   Vitamin B12 deficiency    BP 121/81 (BP Location: Right Arm, Patient Position: Sitting, Cuff Size: Normal)   Pulse 70   Temp 97.8 F (36.6 C) (Oral)   Ht 5\' 8"  (1.727 m)   Wt 202 lb 12.8 oz (92 kg)   SpO2 97%   BMI 30.84 kg/m   Opioid Risk Score:   Fall Risk Score:  `1  Depression screen [facility] 2/9  Depression screen [facility] 2/9 02/23/2021 02/16/2021 02/14/2021 02/14/2021  Decreased Interest 0 0 0 0  Down, Depressed, Hopeless 0 0 0 0  PHQ - 2 Score 0 0 0 0  Altered sleeping - - 0 0  Tired, decreased energy - - 0 0  Change in appetite - - 0 0  Feeling bad or failure about yourself  - - 0 0  Trouble concentrating - - 0 0  Moving slowly or fidgety/restless - - 0 0  Suicidal thoughts - - 0 0  PHQ-9 Score - - 0 0  Some recent data might be hidden     Review of Systems  Musculoskeletal:        Left side pain      Objective:   Physical Exam Vitals and nursing note reviewed.  Constitutional:      Appearance: He is obese.  Eyes:     Extraocular Movements: Extraocular movements intact.     Conjunctiva/sclera: Conjunctivae normal.     Pupils: Pupils are equal, round, and reactive to light.  Musculoskeletal:     Comments: Negative Michel Bickers bilaterally no pain with shoulder range of motion has full range  Neurological:     Mental Status: He is alert and oriented to person, place, and time.     Cranial Nerves: No dysarthria or facial asymmetry.     Motor: Abnormal muscle tone present.     Coordination: Finger-Nose-Finger Test abnormal.     Gait: Gait abnormal.     Comments: Mild dysmetria left finger-nose-finger  Ambulates with a cane no evidence of toe drag or knee instability  Psychiatric:        Mood and Affect: Mood normal.        Behavior: Behavior normal.         Assessment & Plan:  1.  Left PICA syndrome with residual gait  abnormality as well as mild incoordination, dysmetria left upper extremity. 2.  Low back pain mainly left lumbar facets L4-5 L5 S 1 level doing better after injection.  We discussed that the duration of effect is difficult to determine.  At this point is actually longer than expected.  He will call back if his pain flares up again.  We discussed that exercise and activity should help prevent recurrence 3.  Shoulder pain right side resolved without any treatment.  We discussed LAT pull downs as they could exercise for this.  He will call me as needed if this flares up again.

## 2021-05-03 ENCOUNTER — Telehealth: Payer: Self-pay | Admitting: Cardiovascular Disease

## 2021-05-03 NOTE — Telephone Encounter (Signed)
   [facility] Pre-operative Risk Assessment    Patient Name: [name]  DOB: [birth year]  MRN: [id number]   HEARTCARE STAFF: - Please ensure there is not already an duplicate clearance open for this procedure. - Under Visit Info/Reason for Call, type in Other and utilize the format Clearance MM/DD/YY or Clearance TBD. Do not use dashes or single digits. - If request is for dental extraction, please clarify the # of teeth to be extracted. - If the patient is currently at the dentist's office, call Pre-Op APP to address. If the patient is not currently in the dentist office, please route to the Pre-Op pool  Request for surgical clearance:  What type of surgery is being performed? turp   When is this surgery scheduled? 05/10/21   What type of clearance is required (medical clearance vs. Pharmacy clearance to hold med vs. Both)? Medical   Are there any medications that need to be held prior to surgery and how long? NO    Practice name and name of physician performing surgery? [facility]/ Dr. Gloriann Loan    What is the office phone number? [phone] ext. [phone]   7.   What is the office fax number? [phone]  8.   Anesthesia type (None, local, MAC, general) ? General    Kamira J Martinique 05/03/2021, 1:49 PM  _________________________________________________________________   (provider comments below)

## 2021-05-03 NOTE — Telephone Encounter (Signed)
   Name: [name]  DOB: [birth year]  MRN: [id number]   Primary Cardiologist: Jenkins Rouge, MD  Chart reviewed as part of pre-operative protocol coverage. Patient was contacted 05/03/2021 in reference to pre-operative risk assessment for pending surgery as outlined below.  [name] was last seen on 04/27/20 by Dr. Johnsie Cancel.  Since that day, ODDIS WESTLING has done well. He has since followed in HTN clinic and has been working on medication titration. He can complete more than 4.0 METS without angina (strenuous yard work, climbs stairs). As part of his hematuria workup, CT imaging showed possible LV aneurysm. Follow up echocardiogram with suspicion for LV thrombus. However, cardiac MR on 03/02/21 did not confirm presence of thrombus; he is therefore not anticoagulated.   As part of our protocol, patients are generally seen within 1 year to complete preop clearance. However, due to scheduling availability, we are unable to see him until late July (appt has been made). Since procedure is urgent due to possible cancer, I have discussed the case with DOD Dr. Rayann Heman who agrees that the patient can proceed with surgery. The patient understands he is at higher risk for cardiac complications. I do not think additional cardiac testing at this time would mitigate this risk. He agrees to proceed.  Therefore, based on ACC/AHA guidelines, the patient would be at acceptable risk for the planned procedure without further cardiovascular testing.   The patient was advised that if he develops new symptoms prior to surgery to contact our office to arrange for a follow-up visit, and he verbalized understanding.  I will route this recommendation to the requesting party via Epic fax function and remove from pre-op pool. Please call with questions.  Ledora Bottcher, PA 05/03/2021, 3:46 PM

## 2021-05-04 NOTE — Anesthesia Preprocedure Evaluation (Addendum)
Anesthesia Evaluation  Patient identified by MRN, date of birth, ID band Patient awake    Reviewed: Allergy & Precautions, NPO status , Patient's Chart, lab work & pertinent test results  Airway Mallampati: II  TM Distance: >3 FB Neck ROM: Full    Dental no notable dental hx. (+) Dental Advisory Given, Poor Dentition,    Pulmonary sleep apnea and Continuous Positive Airway Pressure Ventilation , Current SmokerPatient did not abstain from smoking.,    Pulmonary exam normal breath sounds clear to auscultation       Cardiovascular hypertension, Pt. on medications and Pt. on home beta blockers + CAD, + Past MI (2015), + Cardiac Stents (2008, 11), + CABG (2015) and + Peripheral Vascular Disease  Normal cardiovascular exam Rhythm:Regular Rate:Normal  Hx of ischemic cardiomyopathy  02/24/21 Echo  1. Left ventricular ejection fraction, by estimation, is 45 to 50%. The  left ventricle has mildly decreased function. The left ventricle  demonstrates regional wall motion abnormalities (LAD territory). Left  ventricular diastolic parameters are  consistent with Grade II diastolic dysfunction (pseudonormalization).  There is a small echodensity seen n the LV apex, only seen in the contrast  A4c views. This could be consistent with LV thrombus.  2. Right ventricular systolic function is normal. The right ventricular  size is normal.  3. The mitral valve is grossly normal. No evidence of mitral valve  regurgitation. No evidence of mitral stenosis.  4. The aortic valve is tricuspid. Aortic valve regurgitation is not  visualized. No aortic stenosis is present.   Comparison(s): A prior study was performed on 04/21/2019. Prior images  reviewed side by side. Echodensity not seen in prior contrast images.  Given clinical history (recent lumbar spinal injection) consider Cardiac  MRI if clinically indicated. LVEF has  slightly improved.     Neuro/Psych CVA negative psych ROS   GI/Hepatic GERD  ,  Endo/Other    Renal/GU Renal diseaseLab Results      Component                Value               Date                      WBC                      7.7                 04/26/2021                HGB                      15.6                04/26/2021                HCT                      48.5                04/26/2021                MCV                      91.5                04/26/2021  PLT                      155                 04/26/2021                Musculoskeletal  (+) Arthritis ,   Abdominal (+) + obese,   Peds  Hematology Lab Results      Component                Value               Date                      WBC                      7.7                 04/26/2021                HGB                      15.6                04/26/2021                HCT                      48.5                04/26/2021                MCV                      91.5                04/26/2021                PLT                      155                 04/26/2021              Anesthesia Other Findings All: Htydrocodone  Reproductive/Obstetrics                           Anesthesia Physical Anesthesia Plan  ASA: 3  Anesthesia Plan: General   Post-op Pain Management:    Induction: Intravenous  PONV Risk Score and Plan: 2 and Treatment may vary due to age or medical condition, Midazolam and Ondansetron  Airway Management Planned: LMA and Oral ETT  Additional Equipment: None  Intra-op Plan:   Post-operative Plan: Extubation in OR  Informed Consent: I have reviewed the patients History and Physical, chart, labs and discussed the procedure including the risks, benefits and alternatives for the proposed anesthesia with the patient or authorized representative who has indicated his/her understanding and acceptance.     Dental advisory given  Plan Discussed with: CRNA and  Anesthesiologist  Anesthesia Plan Comments: (See PAT note 04/26/2021, Konrad Felix, PA-C)      Anesthesia Quick Evaluation

## 2021-05-09 ENCOUNTER — Other Ambulatory Visit (HOSPITAL_COMMUNITY)
Admission: RE | Admit: 2021-05-09 | Discharge: 2021-05-09 | Disposition: A | Discharge status: Home / Self Care | Payer: Medicare Other | Source: Ambulatory Visit | Attending: Urology | Admitting: Urology

## 2021-05-09 DIAGNOSIS — Z20822 Contact with and (suspected) exposure to covid-19: Secondary | ICD-10-CM | POA: Insufficient documentation

## 2021-05-09 DIAGNOSIS — Z01812 Encounter for preprocedural laboratory examination: Secondary | ICD-10-CM | POA: Insufficient documentation

## 2021-05-10 ENCOUNTER — Encounter (HOSPITAL_COMMUNITY)
Admission: RE | Disposition: A | Discharge status: Home / Self Care | Payer: Self-pay | Source: Home / Self Care | Attending: Urology

## 2021-05-10 ENCOUNTER — Other Ambulatory Visit: Payer: Self-pay

## 2021-05-10 ENCOUNTER — Ambulatory Visit (HOSPITAL_COMMUNITY): Payer: Medicare Other | Admitting: Physician Assistant

## 2021-05-10 ENCOUNTER — Ambulatory Visit (HOSPITAL_COMMUNITY): Payer: Medicare Other | Admitting: Anesthesiology

## 2021-05-10 ENCOUNTER — Encounter (HOSPITAL_COMMUNITY): Payer: Self-pay | Admitting: Urology

## 2021-05-10 ENCOUNTER — Observation Stay (HOSPITAL_COMMUNITY)
Admission: RE | Admit: 2021-05-10 | Discharge: 2021-05-11 | Disposition: A | Discharge status: Home / Self Care | Payer: Medicare Other | Attending: Urology | Admitting: Urology

## 2021-05-10 DIAGNOSIS — I110 Hypertensive heart disease with heart failure: Secondary | ICD-10-CM | POA: Diagnosis not present

## 2021-05-10 DIAGNOSIS — I252 Old myocardial infarction: Secondary | ICD-10-CM | POA: Diagnosis not present

## 2021-05-10 DIAGNOSIS — R3915 Urgency of urination: Secondary | ICD-10-CM | POA: Insufficient documentation

## 2021-05-10 DIAGNOSIS — I5089 Other heart failure: Secondary | ICD-10-CM | POA: Diagnosis not present

## 2021-05-10 DIAGNOSIS — R3912 Poor urinary stream: Secondary | ICD-10-CM | POA: Insufficient documentation

## 2021-05-10 DIAGNOSIS — Z79899 Other long term (current) drug therapy: Secondary | ICD-10-CM | POA: Diagnosis not present

## 2021-05-10 DIAGNOSIS — R350 Frequency of micturition: Secondary | ICD-10-CM | POA: Diagnosis not present

## 2021-05-10 DIAGNOSIS — F17210 Nicotine dependence, cigarettes, uncomplicated: Secondary | ICD-10-CM | POA: Diagnosis not present

## 2021-05-10 DIAGNOSIS — R310 Gross hematuria: Secondary | ICD-10-CM | POA: Diagnosis present

## 2021-05-10 DIAGNOSIS — N400 Enlarged prostate without lower urinary tract symptoms: Secondary | ICD-10-CM | POA: Diagnosis present

## 2021-05-10 DIAGNOSIS — Z8673 Personal history of transient ischemic attack (TIA), and cerebral infarction without residual deficits: Secondary | ICD-10-CM | POA: Insufficient documentation

## 2021-05-10 DIAGNOSIS — Z7982 Long term (current) use of aspirin: Secondary | ICD-10-CM | POA: Insufficient documentation

## 2021-05-10 DIAGNOSIS — N401 Enlarged prostate with lower urinary tract symptoms: Secondary | ICD-10-CM | POA: Diagnosis not present

## 2021-05-10 HISTORY — PX: TRANSURETHRAL RESECTION OF PROSTATE: SHX73

## 2021-05-10 LAB — HEMOGLOBIN AND HEMATOCRIT, BLOOD
HCT: 38.3 % — ABNORMAL LOW (ref 39.0–52.0)
Hemoglobin: 11.9 g/dL — ABNORMAL LOW (ref 13.0–17.0)

## 2021-05-10 LAB — SARS CORONAVIRUS 2 (TAT 6-24 HRS): SARS Coronavirus 2: NEGATIVE

## 2021-05-10 SURGERY — TURP (TRANSURETHRAL RESECTION OF PROSTATE)
Anesthesia: General

## 2021-05-10 MED ORDER — ORAL CARE MOUTH RINSE: 15.0000 mL | Freq: Once | OROMUCOSAL | Status: AC

## 2021-05-10 MED ORDER — PANTOPRAZOLE SODIUM 40 MG PO TBEC
80.0000 mg | DELAYED_RELEASE_TABLET | Freq: Every day | ORAL | Status: DC
  Administered 2021-05-11: 80 mg via ORAL
  Filled 2021-05-10: qty 2

## 2021-05-10 MED ORDER — CHLORHEXIDINE GLUCONATE 0.12 % MT SOLN
15.0000 mL | Freq: Once | OROMUCOSAL | Status: AC
  Administered 2021-05-10: 15 mL via OROMUCOSAL

## 2021-05-10 MED ORDER — SODIUM CHLORIDE 0.9 % IR SOLN
Status: DC | PRN
  Administered 2021-05-10: 48000 mL

## 2021-05-10 MED ORDER — PROPOFOL 10 MG/ML IV BOLUS
INTRAVENOUS | Status: DC | PRN
  Administered 2021-05-10: 20 mg via INTRAVENOUS
  Administered 2021-05-10: 10 mg via INTRAVENOUS
  Administered 2021-05-10: 150 mg via INTRAVENOUS

## 2021-05-10 MED ORDER — OXYCODONE HCL 5 MG PO TABS: 5.0000 mg | ORAL_TABLET | Freq: Once | ORAL | Status: DC | PRN

## 2021-05-10 MED ORDER — FENTANYL CITRATE (PF) 100 MCG/2ML IJ SOLN
INTRAMUSCULAR | Status: DC | PRN
  Administered 2021-05-10 (×4): 25 ug via INTRAVENOUS

## 2021-05-10 MED ORDER — LACTATED RINGERS IV SOLN: INTRAVENOUS | Status: DC

## 2021-05-10 MED ORDER — PHENYLEPHRINE HCL-NACL 10-0.9 MG/250ML-% IV SOLN
INTRAVENOUS | Status: DC | PRN
  Administered 2021-05-10: 50 ug/min via INTRAVENOUS

## 2021-05-10 MED ORDER — FENOFIBRATE 160 MG PO TABS: 160.0000 mg | ORAL_TABLET | Freq: Once | ORAL | Status: DC

## 2021-05-10 MED ORDER — ONDANSETRON HCL 4 MG/2ML IJ SOLN: 4.0000 mg | Freq: Once | INTRAMUSCULAR | Status: DC | PRN

## 2021-05-10 MED ORDER — FENTANYL CITRATE (PF) 100 MCG/2ML IJ SOLN: 25.0000 ug | INTRAMUSCULAR | Status: DC | PRN

## 2021-05-10 MED ORDER — PHENYLEPHRINE 40 MCG/ML (10ML) SYRINGE FOR IV PUSH (FOR BLOOD PRESSURE SUPPORT)
PREFILLED_SYRINGE | INTRAVENOUS | Status: AC
  Filled 2021-05-10: qty 10

## 2021-05-10 MED ORDER — LIDOCAINE 2% (20 MG/ML) 5 ML SYRINGE
INTRAMUSCULAR | Status: DC | PRN
  Administered 2021-05-10: 100 mg via INTRAVENOUS

## 2021-05-10 MED ORDER — ALBUMIN HUMAN 5 % IV SOLN
INTRAVENOUS | Status: AC
  Filled 2021-05-10: qty 250

## 2021-05-10 MED ORDER — CEFAZOLIN SODIUM-DEXTROSE 1-4 GM/50ML-% IV SOLN
1.0000 g | Freq: Three times a day (TID) | INTRAVENOUS | Status: AC
  Administered 2021-05-10 – 2021-05-11 (×2): 1 g via INTRAVENOUS
  Filled 2021-05-10 (×2): qty 50

## 2021-05-10 MED ORDER — SODIUM CHLORIDE 0.9 % IV SOLN: INTRAVENOUS | Status: DC

## 2021-05-10 MED ORDER — METOPROLOL SUCCINATE ER 25 MG PO TB24
12.5000 mg | ORAL_TABLET | Freq: Every day | ORAL | Status: DC
  Administered 2021-05-10: 12.5 mg via ORAL
  Filled 2021-05-10: qty 1

## 2021-05-10 MED ORDER — SODIUM CHLORIDE 0.9 % IR SOLN
3000.0000 mL | Status: DC
  Administered 2021-05-10 – 2021-05-11 (×5): 3000 mL

## 2021-05-10 MED ORDER — BACITRACIN-NEOMYCIN-POLYMYXIN 400-5-5000 EX OINT: 1.0000 "application " | TOPICAL_OINTMENT | Freq: Three times a day (TID) | CUTANEOUS | Status: DC | PRN

## 2021-05-10 MED ORDER — ACETAMINOPHEN 10 MG/ML IV SOLN: 1000.0000 mg | Freq: Once | INTRAVENOUS | Status: DC | PRN

## 2021-05-10 MED ORDER — MORPHINE SULFATE (PF) 2 MG/ML IV SOLN: 2.0000 mg | INTRAVENOUS | Status: DC | PRN

## 2021-05-10 MED ORDER — ONDANSETRON HCL 4 MG/2ML IJ SOLN
INTRAMUSCULAR | Status: AC
  Filled 2021-05-10: qty 2

## 2021-05-10 MED ORDER — PNEUMOCOCCAL VAC POLYVALENT 25 MCG/0.5ML IJ INJ
0.5000 mL | INJECTION | INTRAMUSCULAR | Status: DC
  Filled 2021-05-10: qty 0.5

## 2021-05-10 MED ORDER — ICOSAPENT ETHYL 1 G PO CAPS
2.0000 g | ORAL_CAPSULE | Freq: Two times a day (BID) | ORAL | Status: DC
  Administered 2021-05-10 – 2021-05-11 (×2): 2 g via ORAL
  Filled 2021-05-10 (×2): qty 2

## 2021-05-10 MED ORDER — DIPHENHYDRAMINE HCL 12.5 MG/5ML PO ELIX: 12.5000 mg | ORAL_SOLUTION | Freq: Four times a day (QID) | ORAL | Status: DC | PRN

## 2021-05-10 MED ORDER — FENTANYL CITRATE (PF) 100 MCG/2ML IJ SOLN
INTRAMUSCULAR | Status: AC
  Filled 2021-05-10: qty 2

## 2021-05-10 MED ORDER — ATORVASTATIN CALCIUM 40 MG PO TABS
80.0000 mg | ORAL_TABLET | Freq: Every evening | ORAL | Status: DC
  Filled 2021-05-10: qty 2

## 2021-05-10 MED ORDER — HYDROCHLOROTHIAZIDE 25 MG PO TABS
25.0000 mg | ORAL_TABLET | ORAL | Status: DC
  Administered 2021-05-11: 25 mg via ORAL
  Filled 2021-05-10: qty 1

## 2021-05-10 MED ORDER — LIDOCAINE 2% (20 MG/ML) 5 ML SYRINGE
INTRAMUSCULAR | Status: AC
  Filled 2021-05-10: qty 5

## 2021-05-10 MED ORDER — LOSARTAN POTASSIUM 25 MG PO TABS
25.0000 mg | ORAL_TABLET | Freq: Every day | ORAL | Status: DC
  Administered 2021-05-11: 25 mg via ORAL
  Filled 2021-05-10: qty 1

## 2021-05-10 MED ORDER — ONDANSETRON HCL 4 MG/2ML IJ SOLN: 4.0000 mg | INTRAMUSCULAR | Status: DC | PRN

## 2021-05-10 MED ORDER — FENOFIBRATE 160 MG PO TABS
160.0000 mg | ORAL_TABLET | Freq: Every day | ORAL | Status: DC
  Administered 2021-05-11: 160 mg via ORAL
  Filled 2021-05-10: qty 1

## 2021-05-10 MED ORDER — DEXAMETHASONE SODIUM PHOSPHATE 10 MG/ML IJ SOLN
INTRAMUSCULAR | Status: AC
  Filled 2021-05-10: qty 1

## 2021-05-10 MED ORDER — CEFAZOLIN SODIUM-DEXTROSE 2-4 GM/100ML-% IV SOLN
2.0000 g | INTRAVENOUS | Status: AC
  Administered 2021-05-10: 2 g via INTRAVENOUS
  Filled 2021-05-10: qty 100

## 2021-05-10 MED ORDER — PHENYLEPHRINE 40 MCG/ML (10ML) SYRINGE FOR IV PUSH (FOR BLOOD PRESSURE SUPPORT)
PREFILLED_SYRINGE | INTRAVENOUS | Status: DC | PRN
  Administered 2021-05-10: 80 ug via INTRAVENOUS
  Administered 2021-05-10 (×5): 120 ug via INTRAVENOUS
  Administered 2021-05-10: 80 ug via INTRAVENOUS

## 2021-05-10 MED ORDER — MIDAZOLAM HCL 5 MG/5ML IJ SOLN
INTRAMUSCULAR | Status: DC | PRN
  Administered 2021-05-10: 2 mg via INTRAVENOUS

## 2021-05-10 MED ORDER — BEMPEDOIC ACID-EZETIMIBE 180-10 MG PO TABS
1.0000 | ORAL_TABLET | Freq: Every day | ORAL | Status: DC
  Administered 2021-05-11: 1 via ORAL

## 2021-05-10 MED ORDER — ACETAMINOPHEN 325 MG PO TABS: 650.0000 mg | ORAL_TABLET | ORAL | Status: DC | PRN

## 2021-05-10 MED ORDER — OXYCODONE HCL 5 MG PO TABS: 5.0000 mg | ORAL_TABLET | ORAL | Status: DC | PRN

## 2021-05-10 MED ORDER — SENNOSIDES-DOCUSATE SODIUM 8.6-50 MG PO TABS
2.0000 | ORAL_TABLET | Freq: Every day | ORAL | Status: DC
  Administered 2021-05-10: 2 via ORAL
  Filled 2021-05-10: qty 2

## 2021-05-10 MED ORDER — DIPHENHYDRAMINE HCL 50 MG/ML IJ SOLN: 12.5000 mg | Freq: Four times a day (QID) | INTRAMUSCULAR | Status: DC | PRN

## 2021-05-10 MED ORDER — DEXAMETHASONE SODIUM PHOSPHATE 10 MG/ML IJ SOLN
INTRAMUSCULAR | Status: DC | PRN
  Administered 2021-05-10: 10 mg via INTRAVENOUS

## 2021-05-10 MED ORDER — ZOLPIDEM TARTRATE 5 MG PO TABS: 5.0000 mg | ORAL_TABLET | Freq: Every evening | ORAL | Status: DC | PRN

## 2021-05-10 MED ORDER — BELLADONNA ALKALOIDS-OPIUM 16.2-60 MG RE SUPP: 1.0000 | Freq: Four times a day (QID) | RECTAL | Status: DC | PRN

## 2021-05-10 MED ORDER — PROPOFOL 10 MG/ML IV BOLUS
INTRAVENOUS | Status: AC
  Filled 2021-05-10: qty 20

## 2021-05-10 MED ORDER — OXYCODONE HCL 5 MG/5ML PO SOLN: 5.0000 mg | Freq: Once | ORAL | Status: DC | PRN

## 2021-05-10 MED ORDER — 0.9 % SODIUM CHLORIDE (POUR BTL) OPTIME
TOPICAL | Status: DC | PRN
  Administered 2021-05-10: 1000 mL

## 2021-05-10 MED ORDER — OXYBUTYNIN CHLORIDE 5 MG PO TABS: 5.0000 mg | ORAL_TABLET | Freq: Three times a day (TID) | ORAL | Status: DC | PRN

## 2021-05-10 MED ORDER — PHENYLEPHRINE HCL (PRESSORS) 10 MG/ML IV SOLN
INTRAVENOUS | Status: AC
  Filled 2021-05-10: qty 1

## 2021-05-10 MED ORDER — ALBUMIN HUMAN 5 % IV SOLN: INTRAVENOUS | Status: DC | PRN

## 2021-05-10 MED ORDER — MIDAZOLAM HCL 2 MG/2ML IJ SOLN
INTRAMUSCULAR | Status: AC
  Filled 2021-05-10: qty 2

## 2021-05-10 SURGICAL SUPPLY — 21 items
BAG URINE DRAIN 2000ML AR STRL (UROLOGICAL SUPPLIES) ×2 IMPLANT
BAG URO CATCHER STRL LF (MISCELLANEOUS) ×2 IMPLANT
CATH FOLEY 3WAY 30CC 24FR (CATHETERS) ×2
CATH URTH STD 24FR FL 3W 2 (CATHETERS) ×1 IMPLANT
CLOTH BEACON ORANGE TIMEOUT ST (SAFETY) ×2 IMPLANT
DRAPE FOOT SWITCH (DRAPES) ×2 IMPLANT
ELECT REM PT RETURN 15FT ADLT (MISCELLANEOUS) IMPLANT
GLOVE SURG ENC MOIS LTX SZ7.5 (GLOVE) ×2 IMPLANT
GOWN STRL REUS W/TWL XL LVL3 (GOWN DISPOSABLE) ×2 IMPLANT
HOLDER FOLEY CATH W/STRAP (MISCELLANEOUS) ×2 IMPLANT
KIT TURNOVER KIT A (KITS) ×2 IMPLANT
LOOP CUT BIPOLAR 24F LRG (ELECTROSURGICAL) ×6 IMPLANT
LOOP MONOPOLAR YLW (ELECTROSURGICAL) IMPLANT
MANIFOLD NEPTUNE II (INSTRUMENTS) ×2 IMPLANT
PACK CYSTO (CUSTOM PROCEDURE TRAY) ×2 IMPLANT
PENCIL SMOKE EVACUATOR (MISCELLANEOUS) IMPLANT
SYR TOOMEY IRRIG 70ML (MISCELLANEOUS) ×2
SYRINGE TOOMEY IRRIG 70ML (MISCELLANEOUS) ×1 IMPLANT
TUBING CONNECTING 10 (TUBING) ×2 IMPLANT
TUBING UROLOGY SET (TUBING) ×2 IMPLANT
WATER STERILE IRR 500ML POUR (IV SOLUTION) ×2 IMPLANT

## 2021-05-10 NOTE — Discharge Instructions (Signed)

## 2021-05-10 NOTE — Interval H&P Note (Signed)
History and Physical Interval Note:  05/10/2021 10:51 AM  [name]  has presented today for surgery, with the diagnosis of BENIGN PROSTATE HYPERPLASIA.  The various methods of treatment have been discussed with the patient and family. After consideration of risks, benefits and other options for treatment, the patient has consented to  Procedure(s): TRANSURETHRAL RESECTION OF THE PROSTATE (TURP) (N/A) as a surgical intervention.  The patient's history has been reviewed, patient examined, no change in status, stable for surgery.  I have reviewed the patient's chart and labs.  Questions were answered to the patient's satisfaction.     Marton Redwood, III

## 2021-05-10 NOTE — Op Note (Signed)
Preoperative diagnosis: Bladder outlet obstruction secondary to BPH  Postoperative diagnosis:  Bladder outlet obstruction secondary to BPH  Procedure:  Cystoscopy Transurethral resection of the prostate  Surgeon: Marton Redwood, III. M.D.  Anesthesia: General  Complications: None  EBL: 300 cc but difficult to quantify  Specimens: Prostate chips  Indication: [name] is a patient with bladder outlet obstruction secondary to benign prostatic hyperplasia. After reviewing the management options for treatment, he elected to proceed with the above surgical procedure(s). We have discussed the potential benefits and risks of the procedure, side effects of the proposed treatment, the likelihood of the patient achieving the goals of the procedure, and any potential problems that might occur during the procedure or recuperation. Informed consent has been obtained.  Description of procedure:  The patient was taken to the operating room and general anesthesia was induced.  The patient was placed in the dorsal lithotomy position, prepped and draped in the usual sterile fashion, and preoperative antibiotics were administered. A preoperative time-out was performed.   Cystourethroscopy was performed.  The patient's urethra was examined and bilobar prostatic hypertrophy with a very large median lobe .   The bladder was then systematically examined in its entirety. There was no evidence of any bladder tumors, stones, or other mucosal pathology.  The ureteral orifices were identified and marked so as to be avoided during the procedure.  The prostate adenoma was then resected utilizing loop cautery resection with the bipolar cutting loop.  The prostate adenoma from the bladder neck back to the verumontanum was resected beginning at the six o'clock position and then extended to include the right and left lobes of the prostate and anterior prostate. Care was taken not to resect distal to the  verumontanum.  Hemostasis was then achieved with the cautery and the bladder was emptied and reinspected with no significant bleeding noted at the end of the procedure.    A 24 French 3 way catheter was then placed into the bladder.  The patient appeared to tolerate the procedure well and without complications.  The patient was able to be awakened and transferred to the recovery unit in satisfactory condition.

## 2021-05-10 NOTE — H&P (Signed)
CC/HPI: CC: Gross hematuria, urinary urgency, frequency  HPI:  03/16/2021  65 year old male with a long history of smoking cigarettes. He had an episode of gross hematuria about 3 weeks ago. CT scan performed at an outside facility without contrast did not show any nephrolithiasis. He had enlarged prostate. He is on tamsulosin but complains about intermittently weak stream and also primary complaints of significant urgency and urge urinary incontinence. He has a history of spinal fusion at T 13 through 15. Urinalysis negative today. He denied any pain with the hematuria episode. He also complains about some erectile dysfunction. He is able to obtain an erection but it is difficult to obtain and difficult to maintain. He had cardiac bypass several years ago. He is prescribed nitrates but has never had to use them.   04/11/2021  Patient underwent a CT IVP. This revealed no evidence of genitourinary malignancy or stones. He had an enlarged prostate. He also underwent a urodynamics. Had an elevated postvoid residual. Urodynamics was incompletely characterized due to the urodynamics lead following out when he stood to void. 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 He does complain about urinary urgency and frequency with urge incontinence but also weak stream, intermittency, hesitancy.     ALLERGIES: None   MEDICATIONS: Hydrochlorothiazide  Metoprolol Tartrate  Omeprazole  Tamsulosin Hcl  Aspirin Low  Atorvastatin Calcium  Fenofibrate  Losartan Potassium     GU PSH: Complex cystometrogram, w/ void pressure and urethral pressure profile studies, any technique - 03/30/2021 Complex Uroflow - 03/30/2021 Emg surf Electrd - 03/30/2021 Inject For cystogram - 03/30/2021 Intrabd voidng Press - 03/30/2021 Locm 300-399Mg /Ml Iodine,1Ml - 03/28/2021     NON-GU PSH: Back surgery Knee  Arthroscopy/surgery Neck Surgery     GU PMH: Urinary Frequency - 03/30/2021, - 03/16/2021 Urinary Urgency - 03/30/2021, - 03/16/2021 Gross hematuria - 03/28/2021, - 03/16/2021 BPH w/LUTS - 03/16/2021 ED due to arterial insufficiency - 03/16/2021 Encounter for Prostate Cancer screening - 03/16/2021 Weak Urinary Stream - 03/16/2021    NON-GU PMH: Arthritis Heart disease, unspecified Heartburn Hypercholesterolemia Hypertension Myocardial Infarction Other heart failure Sleep Apnea Stroke/TIA    FAMILY HISTORY: 1 Daughter - Other 1 son - Other father deceased - Father Heart Attack - Father   SOCIAL HISTORY: Marital Status: Divorced Current Smoking Status: Patient smokes occasionally. Smokes 1 pack per day.   Tobacco Use Assessment Completed: Used Tobacco in last 30 days? Has never drank.  Drinks 4+ caffeinated drinks per day.    REVIEW OF SYSTEMS:    GU Review Male:   Patient denies frequent urination, hard to postpone urination, burning/ pain with urination, get up at night to urinate, leakage of urine, stream starts and stops, trouble starting your stream, have to strain to urinate , erection problems, and penile pain.  Gastrointestinal (Upper):   Patient denies nausea, vomiting, and indigestion/ heartburn.  Gastrointestinal (Lower):   Patient denies diarrhea and constipation.  Constitutional:   Patient denies fever, night sweats, weight loss, and fatigue.  Skin:   Patient denies skin rash/ lesion and itching.  Eyes:   Patient denies blurred vision and double vision.  Ears/ Nose/ Throat:   Patient denies sore throat and sinus problems.  Hematologic/Lymphatic:   Patient denies swollen glands and easy bruising.  Cardiovascular:   Patient denies leg swelling and chest pains.  Respiratory:   Patient denies cough and shortness of breath.  Endocrine:   Patient  denies excessive thirst.  Musculoskeletal:   Patient denies back pain and joint pain.  Neurological:   Patient denies  headaches and dizziness.  Psychologic:   Patient denies depression and anxiety.   VITAL SIGNS: None   GU PHYSICAL EXAMINATION:    Testes: No tenderness, no swelling, no enlargement left testes. No tenderness, no swelling, no enlargement right testes. Normal location left testes. Normal location right testes. No mass, no cyst, no varicocele, no hydrocele left testes. No mass, no cyst, no varicocele, no hydrocele right testes.  Penis: Circumcised, no warts, no cracks. No dorsal Peyronie's plaques, no left corporal Peyronie's plaques, no right corporal Peyronie's plaques, no scarring, no warts. No balanitis, no meatal stenosis.   MULTI-SYSTEM PHYSICAL EXAMINATION:       Complexity of Data:  Source Of History:  Patient  Lab Test Review:   PSA  Records Review:   Previous Doctor Records, Previous Patient Records  Urodynamics Review:   Review Urodynamics Tests  X-Ray Review: C.T. Abdomen/Pelvis: Reviewed Films. Reviewed Report. Discussed With Patient.     03/16/21  PSA  Total PSA 1.04 ng/mL    PROCEDURES:         Flexible Cystoscopy - 52000  Risks, benefits, and some of the potential complications of the procedure were discussed at length with the patient including infection, bleeding, voiding discomfort, urinary retention, fever, chills, sepsis, and others. All questions were answered. Informed consent was obtained. Antibiotic prophylaxis was given. Sterile technique and intraurethral analgesia were used.  Meatus:  Normal size. Normal location. Normal condition.  Urethra:  No strictures.  External Sphincter:  Normal.  Verumontanum:  Normal.  Prostate:  Obstructing. Severe hyperplasia. about 4 cm, large median lobe  Bladder Neck:  Non-obstructing.  Ureteral Orifices:  Normal location. Normal size. Normal shape.   Bladder:  No trabeculation. No tumors. Normal mucosa. No stones.      The lower urinary tract was carefully examined. The procedure was well-tolerated and without  complications. Antibiotic instructions were given. Instructions were given to call the office immediately for bloody urine, difficulty urinating, urinary retention, painful or frequent urination, fever, chills, nausea, vomiting or other illness. The patient stated that he understood these instructions and would comply with them.   ASSESSMENT:      ICD-10 Details  1 GU:   BPH w/LUTS - N40.1 Chronic, Stable  2   Urinary Urgency - R39.15 Chronic, Stable  3   Weak Urinary Stream - R39.12 Chronic, Stable  4   Gross hematuria - R31.0 Acute, Resolved   PLAN:           Document Letter(s):  Created for Patient: Clinical Summary         Notes:   I have discussed with the patient the fact that the CT scan has revealed no abnormality of the upper tract that could contribute to the presence of microscopic hematuria and cystoscopically no abnormality of the lower tract was identified.   The patient has failed medical management for his lower urinary tract symptoms. He would like to proceed with surgical resection. I discussed bipolar transurethral resection of the prostate. I specifically discussed the risks including but not limited to bleeding which could require blood transfusion, infection, and injury to surrounding structures. Also discussed the possibility that the surgery would not improve symptoms though most men have a great improvement in their symptoms. Also discussed the low likelihood but possibility of development of new symptoms such as irritative voiding symptoms or urinary incontinence. Most men  will have some degree of urinary urgency and discomfort immediately following the surgery that resolves in a short amount of time. He understands that most often this is an outpatient procedure but occasionally patients require hospitalization for continuous bladder irrigation in the event of excess bleeding. He also understands the possibility of being sent home with a urethral catheter. The patient  expressed understanding and is eager to proceed.   Cc: Dr. Rayann Heman   Signed by Link Snuffer, III, M.D. on 04/11/21 at 2:15 PM (EDT

## 2021-05-10 NOTE — Anesthesia Postprocedure Evaluation (Signed)
Anesthesia Post Note  Patient: [name]  Procedure(s) Performed: TRANSURETHRAL RESECTION OF THE PROSTATE (TURP)     Patient location during evaluation: PACU Anesthesia Type: General Level of consciousness: awake and alert Pain management: pain level controlled Vital Signs Assessment: post-procedure vital signs reviewed and stable Respiratory status: spontaneous breathing, nonlabored ventilation and respiratory function stable Cardiovascular status: blood pressure returned to baseline and stable Postop Assessment: no apparent nausea or vomiting Anesthetic complications: no   No notable events documented.  Last Vitals:  Vitals:   05/10/21 1442 05/10/21 1445  BP:  114/74  Pulse: 76 66  Resp: 15 10  Temp:  36.6 C  SpO2: 99% 99%    Last Pain:  Vitals:   05/10/21 1445  TempSrc:   PainSc: 0-No pain                 Khadejah Son A.

## 2021-05-10 NOTE — Anesthesia Procedure Notes (Signed)
Procedure Name: LMA Insertion Date/Time: 05/10/2021 11:26 AM Performed by: Sharlette Dense, CRNA Patient Re-evaluated:Patient Re-evaluated prior to induction Oxygen Delivery Method: Circle system utilized Preoxygenation: Pre-oxygenation with 100% oxygen Induction Type: IV induction LMA: LMA inserted LMA Size: 4.0 Number of attempts: 1 Placement Confirmation: positive ETCO2 and breath sounds checked- equal and bilateral Tube secured with: Tape Dental Injury: Teeth and Oropharynx as per pre-operative assessment

## 2021-05-10 NOTE — Transfer of Care (Signed)
Immediate Anesthesia Transfer of Care Note  Patient: [name]  Procedure(s) Performed: TRANSURETHRAL RESECTION OF THE PROSTATE (TURP)  Patient Location: PACU  Anesthesia Type:General  Level of Consciousness: alert   Airway & Oxygen Therapy: Patient Spontanous Breathing and Patient connected to face mask oxygen  Post-op Assessment: Report given to RN and Post -op Vital signs reviewed and stable  Post vital signs: Reviewed and stable  Last Vitals:  Vitals Value Taken Time  BP 110/73 05/10/21 1332  Temp    Pulse 78 05/10/21 1335  Resp 13 05/10/21 1335  SpO2 100 % 05/10/21 1335  Vitals shown include unvalidated device data.  Last Pain:  Vitals:   05/10/21 0949  TempSrc: Oral  PainSc:          Complications: No notable events documented.

## 2021-05-11 ENCOUNTER — Encounter (HOSPITAL_COMMUNITY): Payer: Self-pay | Admitting: Urology

## 2021-05-11 DIAGNOSIS — N401 Enlarged prostate with lower urinary tract symptoms: Secondary | ICD-10-CM | POA: Diagnosis not present

## 2021-05-11 LAB — HEMOGLOBIN AND HEMATOCRIT, BLOOD
HCT: 36.6 % — ABNORMAL LOW (ref 39.0–52.0)
Hemoglobin: 11.7 g/dL — ABNORMAL LOW (ref 13.0–17.0)

## 2021-05-11 LAB — SURGICAL PATHOLOGY

## 2021-05-11 MED ORDER — TRAMADOL HCL 50 MG PO TABS: 50.0000 mg | ORAL_TABLET | Freq: Four times a day (QID) | ORAL | 0 refills | Status: DC | PRN

## 2021-05-11 NOTE — Progress Notes (Signed)
Pt to be discharged to home today. Pt and Pt's Wife given given discharge instructions/teaching including Discharge Medications and schedules for these Medications. Pt and Pt's Wife also given foley and leg bag instructions. Pt and Pt's Wife verbalized understanding of all discharge teaching. Discharge AVS paperwork with Pt at time of discharge

## 2021-05-12 LAB — POCT I-STAT, CHEM 8
BUN: 12 mg/dL (ref 8–23)
Calcium, Ion: 1.18 mmol/L (ref 1.15–1.40)
Chloride: 109 mmol/L (ref 98–111)
Creatinine, Ser: 0.8 mg/dL (ref 0.61–1.24)
Glucose, Bld: 77 mg/dL (ref 70–99)
HCT: 32 % — ABNORMAL LOW (ref 39.0–52.0)
Hemoglobin: 10.9 g/dL — ABNORMAL LOW (ref 13.0–17.0)
Potassium: 3.7 mmol/L (ref 3.5–5.1)
Sodium: 141 mmol/L (ref 135–145)
TCO2: 24 mmol/L (ref 22–32)

## 2021-05-12 NOTE — Discharge Summary (Signed)
Physician Discharge Summary  Patient ID: [name] MRN: [id number] DOB/AGE: [birth year] 65 y.o.  Admit date: 05/10/2021 Discharge date: 05/11/2021  Admission Diagnoses:  Discharge Diagnoses:  Active Problems:   BPH (benign prostatic hyperplasia)   Discharged Condition: good  Hospital Course: Patient underwent a TURP.  He remained overnight on observation.  Hematuria improved on continuous bladder irrigation which was able to be weaned off.  He was discharged with Foley catheter.  Hemoglobin remained stable.  Consults: None  Significant Diagnostic Studies: None  Treatments: surgery: As above  Discharge Exam: Blood pressure 132/80, pulse 74, temperature 98.5 F (36.9 C), temperature source Oral, resp. rate 14, height 5\' 8"  (1.727 m), weight 92 kg, SpO2 97 %. General appearance: alert and no distress Three-way Foley catheter in place draining light red urine off CBI  Disposition: Discharge disposition: 01-Home or Self Care        Allergies as of 05/11/2021       Reactions   Hydrocodone Other (See Comments)   Throat itchy, weakness- took this during stroke not sure if caused problem  throat itchy, weakness        Medication List     STOP taking these medications    aspirin 81 MG EC tablet Commonly known as: Aspirin Low Dose   tamsulosin 0.4 MG Caps capsule Commonly known as: FLOMAX       TAKE these medications    Baclofen 5 MG Tabs Take 5 mg by mouth at bedtime.   docusate sodium 100 MG capsule Commonly known as: COLACE Take 100 mg by mouth in the morning and at bedtime.   hydrochlorothiazide 25 MG tablet Commonly known as: HYDRODIURIL Take 25 mg by mouth every other day. In the morning Notes to patient: Last Dose of this Medication was given on May 11, 2021 at 10:16 am    icosapent Ethyl 1 g capsule Commonly known as: Vascepa Take 2 capsules (2 g total) by mouth 2 (two) times daily. Notes to patient: Last Dose of this Medication was given on  May 11, 2021 at 10:16 am   losartan 50 MG tablet Commonly known as: COZAAR Take 0.5 tablets (25 mg total) by mouth daily. Notes to patient: Last Dose of this Medication was given on May 11, 2021 at 10:16 am   metoprolol succinate 25 MG 24 hr tablet Commonly known as: TOPROL-XL Take 12.5 mg by mouth at bedtime.   omeprazole 40 MG capsule Commonly known as: PRILOSEC Take 40 mg by mouth in the morning.   traMADol 50 MG tablet Commonly known as: ULTRAM Take 50 mg by mouth every 6 (six) hours as needed (pain). What changed: Another medication with the same name was added. Make sure you understand how and when to take each.   traMADol 50 MG tablet Commonly known as: Ultram Take 1 tablet (50 mg total) by mouth every 6 (six) hours as needed. What changed: You were already taking a medication with the same name, and this prescription was added. Make sure you understand how and when to take each.   vitamin B-12 500 MCG tablet Commonly known as: CYANOCOBALAMIN Take 500 mcg by mouth in the morning.       ASK your doctor about these medications    atorvastatin 80 MG tablet Commonly known as: LIPITOR TAKE 1 TABLET BY MOUTH  DAILY   fenofibrate 145 MG tablet Commonly known as: TRICOR TAKE 1 TABLET BY MOUTH  DAILY   Nexlizet 180-10 MG Tabs Generic drug: Bempedoic Acid-Ezetimibe  Take 1 tablet by mouth daily.   nitroGLYCERIN 0.4 MG SL tablet Commonly known as: NITROSTAT Place 1 tablet (0.4 mg total) under the tongue every 5 (five) minutes as needed for chest pain.         Signed: Marton Redwood, III 05/12/2021, 2:09 PM

## 2021-05-17 ENCOUNTER — Telehealth: Payer: Self-pay | Admitting: Pharmacist

## 2021-05-17 NOTE — Telephone Encounter (Signed)
Called patient to see how his blood pressure was doing on the lower dose of losartan. He asked me to speak with his fiance. Per pt fiance she states his BP is doing much better. In the AM its around 126/80 and in the evening 108-117/70-85. Will continue losartan 25mg  daily, Toprol XL 12.5mg  daily in PM and HCTZ 25mg  daily. Follow up w/ Dr. Johnsie Cancel 7/29

## 2021-05-23 ENCOUNTER — Ambulatory Visit: Payer: Medicare Other | Admitting: Adult Health

## 2021-05-23 ENCOUNTER — Encounter: Payer: Self-pay | Admitting: Adult Health

## 2021-05-23 VITALS — BP 123/76 | HR 76 | Ht 68.0 in | Wt 205.4 lb

## 2021-05-23 DIAGNOSIS — M4802 Spinal stenosis, cervical region: Secondary | ICD-10-CM

## 2021-05-23 DIAGNOSIS — G801 Spastic diplegic cerebral palsy: Secondary | ICD-10-CM

## 2021-05-23 MED ORDER — BACLOFEN 5 MG PO TABS: 5.0000 mg | ORAL_TABLET | Freq: Three times a day (TID) | ORAL | 5 refills | Status: DC
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
## 2021-05-23 NOTE — Patient Instructions (Addendum)
Your Plan:  Increase baclofen to 5 mg 3 times daily -please call with any difficulty tolerating or if dosage needs to be increased  Referral placed to Dr. Vertell Limber for further evaluation of cervical spinal stenosis   Follow-up with Dr. Jaynee Eagles in 4 months or call earlier if needed     Thank you for coming to see [place] at [facility]. I hope we have been able to provide you high quality care today.  You may receive a patient satisfaction survey over the next few weeks. We would appreciate your feedback and comments so that we may continue to improve ourselves and the health of our patients.

## 2021-05-24 ENCOUNTER — Telehealth: Payer: Self-pay | Admitting: Adult Health

## 2021-05-24 NOTE — Telephone Encounter (Signed)
Referral to [facility] faxed today. They will call patient to schedule. Phone: [phone].

## 2021-05-29 NOTE — Progress Notes (Signed)
Patient ID: [name], male   DOB: 10/25/1956, 65 y.o.   MRN: [id number]    65 y.o. f/u for lipids and CAD. Previous BMS to RCA and LAD in 2002/2008. 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 Now off plavix per neurology   Had ILR placed by Dr Lovena Le June 2017 No PAF detected  Has had edema on norvasc and back on lasix. 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  All other systems reviewed and negative  General: There were no vitals taken for this visit.  Affect appropriate Healthy:  appears stated age 25: normal Neck supple with no adenopathy JVP normal no bruits no thyromegaly Lungs clear with no wheezing and good diaphragmatic motion Heart:  S1/S2 no murmur, no rub, gallop or click PMI normal Abdomen: benighn, BS positve, no tenderness, no AAA no bruit.  No HSM or HJR Distal pulses intact with no bruits Mild bilateral edema Neuro non-focal poor  balance  Skin warm and dry Poor balance walking with cane     Current Outpatient Medications  Medication Sig Dispense Refill   atorvastatin (LIPITOR) 80 MG tablet TAKE 1 TABLET BY MOUTH  DAILY (Patient taking differently: Take 80 mg by mouth every evening.) 90 tablet 0   Baclofen 5 MG TABS Take 5 mg by mouth 3 (three) times daily. 90 tablet 5   Bempedoic Acid-Ezetimibe (NEXLIZET) 180-10 MG TABS Take 1 tablet by mouth daily. (Patient taking differently: Take 1 tablet by mouth in the morning.) 90 tablet 3   docusate sodium (COLACE) 100 MG capsule Take 100 mg by mouth in the morning and at bedtime.     fenofibrate (TRICOR) 145 MG tablet TAKE 1 TABLET BY MOUTH  DAILY (Patient taking differently: Take 145 mg by mouth in the morning.) 90 tablet 0   hydrochlorothiazide (HYDRODIURIL) 25 MG tablet Take 25 mg by mouth every other day. In the morning     icosapent Ethyl (VASCEPA) 1 g capsule Take 2 capsules (2 g total) by mouth 2 (two) times daily. 360 capsule 3   losartan (COZAAR) 50 MG tablet Take 0.5 tablets (25 mg total) by mouth daily. 90 tablet 0   metoprolol succinate (TOPROL-XL) 25 MG 24 hr tablet Take 12.5 mg by mouth at bedtime. 90 tablet 0   nitroGLYCERIN (NITROSTAT) 0.4 MG SL tablet Place 1 tablet (0.4 mg total) under the tongue  every 5 (five) minutes as needed for chest pain. (Patient taking differently: Place 0.4 mg under the tongue every 5 (five) minutes x 3 doses as needed for chest pain.) 25 tablet 3   omeprazole (PRILOSEC) 40 MG capsule Take 40 mg by mouth in the morning.     vitamin B-12 (CYANOCOBALAMIN) 500 MCG tablet Take 500 mcg by mouth in the morning.     No current facility-administered medications for this visit.    Allergies  Hydrocodone  Electrocardiogram:  01/08/19 SR rate 61 anterolateral T wave changes 04/27/20 SR nonspecific lateral T wave changes   Assessment and Plan  Cerebellar Stroke:   Plavix d/c by neuro thought to be dissection of vertebral. Continue  cholesterol meds , ASA and non smoking  CAD: Stable with no angina and good activity level.  Continue lopressor and ASA   DCM  euvolemic functional class one  activity limited by cerebellar stroke Some edema d/c hydrodiuril And start lasix 20 mg daily consider adding aldactone in 3 months BMET today EF 39% by MRI 03/02/21 no mural apical thrombus    Chol: will clarify with pharm D if he needs to be on 4 meds Most recent LDL 46 12/06/20    HTN: Well controlled.  Continue current medications and low sodium Dash type diet.    Back Pain:    F/U neurosurgery and [place]   Prostate:  BPH post TURP July 2022  f/u urology   Smoking:  wheezing on exam Albuterol inhaler called in lung cancer screening CT     Lasix 20 mg  D/C HCTZ BMET Lung cancer CT   F/U in 3 months   Jenkins Rouge

## 2021-06-02 ENCOUNTER — Other Ambulatory Visit: Payer: Self-pay

## 2021-06-02 ENCOUNTER — Encounter: Payer: Self-pay | Admitting: Cardiovascular Disease

## 2021-06-02 ENCOUNTER — Ambulatory Visit: Payer: Medicare Other | Admitting: Cardiovascular Disease

## 2021-06-02 VITALS — BP 122/62 | HR 79 | Ht 68.0 in | Wt 203.0 lb

## 2021-06-02 DIAGNOSIS — I420 Dilated cardiomyopathy: Secondary | ICD-10-CM | POA: Diagnosis not present

## 2021-06-02 DIAGNOSIS — R9389 Abnormal findings on diagnostic imaging of other specified body structures: Secondary | ICD-10-CM | POA: Diagnosis not present

## 2021-06-02 DIAGNOSIS — Z79899 Other long term (current) drug therapy: Secondary | ICD-10-CM

## 2021-06-02 DIAGNOSIS — F17200 Nicotine dependence, unspecified, uncomplicated: Secondary | ICD-10-CM

## 2021-06-02 LAB — BASIC METABOLIC PANEL
BUN/Creatinine Ratio: 14 (ref 10–24)
BUN: 15 mg/dL (ref 8–27)
CO2: 22 mmol/L (ref 20–29)
Calcium: 9.5 mg/dL (ref 8.6–10.2)
Chloride: 101 mmol/L (ref 96–106)
Creatinine, Ser: 1.05 mg/dL (ref 0.76–1.27)
Glucose: 99 mg/dL (ref 65–99)
Potassium: 4.2 mmol/L (ref 3.5–5.2)
Sodium: 139 mmol/L (ref 134–144)
eGFR: 79 mL/min/{1.73_m2} (ref >59)

## 2021-06-02 MED ORDER — ALBUTEROL SULFATE HFA 108 (90 BASE) MCG/ACT IN AERS: 2.0000 | INHALATION_SPRAY | Freq: Four times a day (QID) | RESPIRATORY_TRACT | 2 refills | Status: DC | PRN

## 2021-06-02 MED ORDER — FUROSEMIDE 20 MG PO TABS: 20.0000 mg | ORAL_TABLET | Freq: Every day | ORAL | 3 refills | Status: DC

## 2021-06-02 NOTE — Progress Notes (Signed)
Ordered albuterol per office visit.

## 2021-06-02 NOTE — Patient Instructions (Addendum)
Medication Instructions:  Your physician has recommended you make the following change in your medication:  1-STOP Hydrochlorithiazide  2-START Furosemide 20 mg by mouth daily.  *If you need a refill on your cardiac medications before your next appointment, please call your pharmacy*  Lab Work: Your physician recommends that you have lab work today- BMET Your physician recommends that you return for lab work in: 3 weeks for BMET  If you have labs (blood work) drawn today and your tests are completely normal, you will receive your results only by: [employer] (if you have MyChart) OR A paper copy in the mail If you have any lab test that is abnormal or we need to change your treatment, we will call you to review the results.  Testing/Procedures: Non-Cardiac CT scanning for lung cancer screening, (CAT scanning), is a noninvasive, special x-ray that produces cross-sectional images of the body using x-rays and a computer. CT scans help physicians diagnose and treat medical conditions. For some CT exams, a contrast material is used to enhance visibility in the area of the body being studied. CT scans provide greater clarity and reveal more details than regular x-ray exams.   Follow-Up: At [facility], you and your health needs are our priority.  As part of our continuing mission to provide you with exceptional heart care, we have created designated Provider Care Teams.  These Care Teams include your primary Cardiologist (physician) and Advanced Practice Providers (APPs -  Physician Assistants and Nurse Practitioners) who all work together to provide you with the care you need, when you need it.  We recommend signing up for the patient portal called "MyChart".  Sign up information is provided on this After Visit Summary.  MyChart is used to connect with patients for Virtual Visits (Telemedicine).  Patients are able to view lab/test results, encounter notes, upcoming appointments, etc.   Non-urgent messages can be sent to your provider as well.   To learn more about what you can do with MyChart, go to [email or web address].    Your next appointment:   3 month(s)  The format for your next appointment:   In Person  Provider:   You may see Jenkins Rouge, MD or one of the following Advanced Practice Providers on your designated Care Team:   Cecilie Kicks, NP

## 2021-06-04 ENCOUNTER — Other Ambulatory Visit: Payer: Self-pay | Admitting: Cardiovascular Disease

## 2021-06-15 ENCOUNTER — Telehealth: Payer: Self-pay | Admitting: Cardiovascular Disease

## 2021-06-15 MED ORDER — EZETIMIBE 10 MG PO TABS: 10.0000 mg | ORAL_TABLET | Freq: Every day | ORAL | 11 refills | Status: DC

## 2021-06-15 MED ORDER — OMEGA-3-ACID ETHYL ESTERS 1 G PO CAPS: 2.0000 g | ORAL_CAPSULE | Freq: Two times a day (BID) | ORAL | 11 refills | Status: DC

## 2021-06-15 NOTE — Telephone Encounter (Signed)
Pt c/o medication issue:  1. Name of Medication:   icosapent Ethyl (VASCEPA) 1 g capsule  Bempedoic Acid-Ezetimibe (NEXLIZET) 180-10 MG TABS   2. How are you currently taking this medication (dosage and times per day)? TAKE 2 CAPSULES BY MOUTH TWICE A DAY  Take 1 tablet by mouth daily.Patient taking differently: Take 1 tablet by mouth in the morning.    3. Are you having a reaction (difficulty breathing--STAT)? no  4. What is your medication issue? patients grant for his Vascepa and Nexlizet has exp and the pt is no longer able to afford it, pt would like to know if there is an alternative medication he is able to take until there is another grant available... please advise

## 2021-06-15 NOTE — Telephone Encounter (Addendum)
Kamira sent me a secure chat message about this earlier. Pt's [employer] has expired so he can no longer afford his Vascepa or Nexlizet. I provided her with a verbal order to change pt from Vascepa to Lovaza 2g twice daily and from Nexlizet to ezetimibe '10mg'$  once daily which should be cheaper. She advised me she would call pt to discuss med changes. I have updated medication list with new orders to assist. Pt should continue on atorvastatin and fenofibrate for his cholesterol. I will call him when the [place] re-opens so that he can be changed back to [place].

## 2021-06-18 ENCOUNTER — Other Ambulatory Visit: Payer: Self-pay | Admitting: Cardiovascular Disease

## 2021-06-18 DIAGNOSIS — I10 Essential (primary) hypertension: Secondary | ICD-10-CM

## 2021-06-18 DIAGNOSIS — I420 Dilated cardiomyopathy: Secondary | ICD-10-CM

## 2021-06-19 NOTE — Telephone Encounter (Signed)
Spoke w/ pt this morning to clarify... pt is aware of new rxs to replace Vascepa and Nexlizet, will follow up w/ any further issues or concerns.

## 2021-06-20 ENCOUNTER — Other Ambulatory Visit: Payer: Self-pay | Admitting: Cardiovascular Disease

## 2021-06-20 DIAGNOSIS — I420 Dilated cardiomyopathy: Secondary | ICD-10-CM

## 2021-06-20 DIAGNOSIS — I10 Essential (primary) hypertension: Secondary | ICD-10-CM

## 2021-06-21 ENCOUNTER — Ambulatory Visit: Payer: Medicare Other

## 2021-06-23 ENCOUNTER — Ambulatory Visit (INDEPENDENT_AMBULATORY_CARE_PROVIDER_SITE_OTHER)
Admission: RE | Admit: 2021-06-23 | Discharge: 2021-06-23 | Disposition: A | Discharge status: Home / Self Care | Payer: Medicare Other | Source: Ambulatory Visit | Attending: Cardiovascular Disease | Admitting: Cardiovascular Disease

## 2021-06-23 ENCOUNTER — Other Ambulatory Visit: Payer: Self-pay

## 2021-06-23 ENCOUNTER — Other Ambulatory Visit: Payer: Medicare Other | Admitting: *Deleted

## 2021-06-23 DIAGNOSIS — F17200 Nicotine dependence, unspecified, uncomplicated: Secondary | ICD-10-CM

## 2021-06-23 DIAGNOSIS — Z87891 Personal history of nicotine dependence: Secondary | ICD-10-CM

## 2021-06-23 DIAGNOSIS — R9389 Abnormal findings on diagnostic imaging of other specified body structures: Secondary | ICD-10-CM

## 2021-06-23 DIAGNOSIS — Z79899 Other long term (current) drug therapy: Secondary | ICD-10-CM

## 2021-06-23 DIAGNOSIS — I420 Dilated cardiomyopathy: Secondary | ICD-10-CM

## 2021-06-23 LAB — BASIC METABOLIC PANEL
BUN/Creatinine Ratio: 12 (ref 10–24)
BUN: 12 mg/dL (ref 8–27)
CO2: 23 mmol/L (ref 20–29)
Calcium: 9.1 mg/dL (ref 8.6–10.2)
Chloride: 103 mmol/L (ref 96–106)
Creatinine, Ser: 1 mg/dL (ref 0.76–1.27)
Glucose: 94 mg/dL (ref 65–99)
Potassium: 4.2 mmol/L (ref 3.5–5.2)
Sodium: 141 mmol/L (ref 134–144)
eGFR: 84 mL/min/{1.73_m2} (ref >59)

## 2021-08-21 ENCOUNTER — Other Ambulatory Visit: Payer: Self-pay | Admitting: Cardiovascular Disease

## 2021-08-24 NOTE — Progress Notes (Signed)
Patient ID: HISAO DOO, male   DOB: [birth year], 65 y.o.   MRN: [id number]    65 y.o. f/u for lipids and CAD. Previous BMS to RCA and LAD in 2002/2008. 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 Now off plavix per neurology   Had ILR placed by Dr Lovena Le June 2017 No PAF detected  Has had edema on norvasc and back on lasix. 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  All other systems reviewed and negative  General: BP 126/76   Pulse 70   Ht 5\' 8"  (1.727 m)   Wt 214 lb 14.4 oz (97.5 kg)   SpO2 97%   BMI 32.68 kg/m   Affect appropriate Healthy:  appears stated age 3: normal Neck supple with no adenopathy JVP normal no bruits no thyromegaly Lungs clear with no wheezing and good diaphragmatic motion Heart:  S1/S2  no murmur, no rub, gallop or click PMI normal Abdomen: benighn, BS positve, no tenderness, no AAA no bruit.  No HSM or HJR Distal pulses intact with no bruits Plus 2  bilateral edema Neuro non-focal poor balance  Skin warm and dry Poor balance walking with cane     Current Outpatient Medications  Medication Sig Dispense Refill   albuterol (VENTOLIN HFA) 108 (90 Base) MCG/ACT inhaler TAKE 2 PUFFS BY MOUTH EVERY 6 HOURS AS NEEDED FOR WHEEZE OR SHORTNESS OF BREATH 8.5 each 2   atorvastatin (LIPITOR) 80 MG tablet TAKE 1 TABLET BY MOUTH  DAILY 90 tablet 3   Baclofen 5 MG TABS Take 5 mg by mouth 3 (three) times daily. 90 tablet 5   docusate sodium (COLACE) 100 MG capsule Take 100 mg by mouth in the morning and at bedtime.     ezetimibe (ZETIA) 10 MG tablet Take 1 tablet (10 mg total) by mouth daily. 30 tablet 11   fenofibrate (TRICOR) 145 MG tablet TAKE 1 TABLET BY MOUTH  DAILY 90 tablet 3   furosemide (LASIX) 20 MG tablet Take 1 tablet (20 mg total) by mouth daily. 90 tablet 3   losartan (COZAAR) 50 MG tablet TAKE 1 TABLET BY MOUTH  DAILY 90 tablet 3   metoprolol succinate (TOPROL-XL) 25 MG 24 hr tablet TAKE 1 TABLET BY  MOUTH  DAILY 90 tablet 3   nitroGLYCERIN (NITROSTAT) 0.4 MG SL tablet Place 1 tablet (0.4 mg total) under the tongue every 5 (five) minutes as needed for chest pain. (Patient taking differently: Place 0.4 mg under the tongue every 5 (five) minutes x 3 doses as needed for chest pain.) 25 tablet 3   omeprazole (PRILOSEC) 40 MG capsule Take 40 mg by mouth in the morning.     vitamin B-12 (CYANOCOBALAMIN) 500 MCG tablet Take 500 mcg by mouth in the morning.     omega-3 acid ethyl esters (LOVAZA) 1 g capsule Take 2 capsules (2 g total) by mouth 2 (two) times daily. (Patient not taking: Reported on 09/06/2021) 120 capsule 11   No current facility-administered medications for this visit.    Allergies  Hydrocodone  Electrocardiogram:  01/08/19 SR rate 61 anterolateral T wave  changes 04/27/20 SR nonspecific lateral T wave changes   Assessment and Plan  Cerebellar Stroke:   Plavix d/c by neuro thought to be dissection of vertebral. Continue cholesterol meds , ASA and non smoking  CAD: Stable with no angina and good activity level.  Continue lopressor and ASA   DCM  euvolemic functional class one  activity limited by cerebellar stroke Some edema better on lasix  EF 39% by MRI 03/02/21 no mural apical thrombus    Chol: will clarify with pharm D if he needs to be on 4 meds Most recent LDL 46 12/06/20    HTN: Well controlled.  Continue current medications and low sodium Dash type diet.    Back Pain:    F/U neurosurgery and Kirsteins   Prostate:  BPH post TURP July 2022  f/u urology   Smoking:  wheezing on exam Albuterol inhaler called in lung cancer screening CT no lesions 06/24/21  HLD:  f/u pharm D see HPI changes made due to cost of meds will need updated labs   Lipid and liver    F/U in 6 months   Jenkins Rouge

## 2021-08-25 ENCOUNTER — Telehealth: Payer: Self-pay

## 2021-08-25 MED ORDER — EZETIMIBE 10 MG PO TABS: 10.0000 mg | ORAL_TABLET | Freq: Every day | ORAL | 11 refills | Status: DC

## 2021-08-25 NOTE — Telephone Encounter (Signed)
Medication refill request for Ezetimibe 10 mg tablets approved and sent to [facility]

## 2021-09-06 ENCOUNTER — Ambulatory Visit: Payer: Medicare Other | Admitting: Cardiovascular Disease

## 2021-09-06 ENCOUNTER — Encounter: Payer: Self-pay | Admitting: Cardiovascular Disease

## 2021-09-06 ENCOUNTER — Other Ambulatory Visit: Payer: Self-pay

## 2021-09-06 VITALS — BP 126/76 | HR 70 | Ht 68.0 in | Wt 214.9 lb

## 2021-09-06 DIAGNOSIS — E785 Hyperlipidemia, unspecified: Secondary | ICD-10-CM | POA: Diagnosis not present

## 2021-09-06 DIAGNOSIS — R609 Edema, unspecified: Secondary | ICD-10-CM | POA: Diagnosis not present

## 2021-09-06 DIAGNOSIS — I420 Dilated cardiomyopathy: Secondary | ICD-10-CM | POA: Diagnosis not present

## 2021-09-06 DIAGNOSIS — F17200 Nicotine dependence, unspecified, uncomplicated: Secondary | ICD-10-CM

## 2021-09-06 DIAGNOSIS — I2510 Atherosclerotic heart disease of native coronary artery without angina pectoris: Secondary | ICD-10-CM

## 2021-09-06 LAB — HEPATIC FUNCTION PANEL
ALT: 15 IU/L (ref 0–44)
AST: 18 IU/L (ref 0–40)
Albumin: 4.5 g/dL (ref 3.8–4.8)
Alkaline Phosphatase: 70 IU/L (ref 44–121)
Bilirubin Total: 0.5 mg/dL (ref 0.0–1.2)
Bilirubin, Direct: 0.17 mg/dL (ref 0.00–0.40)
Total Protein: 7 g/dL (ref 6.0–8.5)

## 2021-09-06 LAB — LIPID PANEL
Chol/HDL Ratio: 3.8 ratio (ref 0.0–5.0)
Cholesterol, Total: 133 mg/dL (ref 100–199)
HDL: 35 mg/dL — ABNORMAL LOW (ref >39)
LDL Chol Calc (NIH): 78 mg/dL (ref 0–99)
Triglycerides: 108 mg/dL (ref 0–149)
VLDL Cholesterol Cal: 20 mg/dL (ref 5–40)

## 2021-09-06 NOTE — Patient Instructions (Signed)
Medication Instructions:   *If you need a refill on your cardiac medications before your next appointment, please call your pharmacy*  Lab Work: Your physician recommends that you have lab work today- lipid and liver panel.   If you have labs (blood work) drawn today and your tests are completely normal, you will receive your results only by: [place] (if you have MyChart) OR A paper copy in the mail If you have any lab test that is abnormal or we need to change your treatment, we will call you to review the results.  Follow-Up: At [facility], you and your health needs are our priority.  As part of our continuing mission to provide you with exceptional heart care, we have created designated Provider Care Teams.  These Care Teams include your primary Cardiologist (physician) and Advanced Practice Providers (APPs -  Physician Assistants and Nurse Practitioners) who all work together to provide you with the care you need, when you need it.  We recommend signing up for the patient portal called "MyChart".  Sign up information is provided on this After Visit Summary.  MyChart is used to connect with patients for Virtual Visits (Telemedicine).  Patients are able to view lab/test results, encounter notes, upcoming appointments, etc.  Non-urgent messages can be sent to your provider as well.   To learn more about what you can do with MyChart, go to [email or web address].    Your next appointment:   6 month(s)  The format for your next appointment:   In Person  Provider:   You may see Jenkins Rouge, MD or one of the following Advanced Practice Providers on your designated Care Team:   Cecilie Kicks, NP

## 2021-09-12 ENCOUNTER — Telehealth: Payer: Self-pay | Admitting: Pharmacist

## 2021-09-12 NOTE — Telephone Encounter (Signed)
Left message for pt.  Pt previously on Vascepa and Nexlizet, changed to Lovaza and ezetimibe in August when his [employer] ran out because his med copays were too expensive. [facility] is now open again so will re-enroll pt and change him back to [place] (he also continues on atorvastatin and fenofibrate).

## 2021-09-13 MED ORDER — ICOSAPENT ETHYL 1 G PO CAPS: 2.0000 g | ORAL_CAPSULE | Freq: Two times a day (BID) | ORAL | 3 refills | Status: DC

## 2021-09-13 MED ORDER — NEXLIZET 180-10 MG PO TABS: 1.0000 | ORAL_TABLET | Freq: Every day | ORAL | 3 refills | Status: DC

## 2021-09-13 NOTE — Telephone Encounter (Signed)
Called pt again and spoke with him and his fiance who helps with his medications. Successfully re-enrolled pt in [employer]. He is aware to stop Lovaza and resume Vascepa, and to stop ezetimibe and resume Nexlizet. I have called grant info into his pharmacy and clarified changes in rx with them.

## 2021-09-18 ENCOUNTER — Ambulatory Visit: Payer: Medicare Other | Admitting: Neurology

## 2021-09-19 ENCOUNTER — Ambulatory Visit: Payer: Medicare Other | Admitting: Neurology

## 2021-11-02 ENCOUNTER — Other Ambulatory Visit: Payer: Self-pay | Admitting: Cardiovascular Disease

## 2021-12-05 ENCOUNTER — Telehealth: Payer: Self-pay | Admitting: Cardiovascular Disease

## 2021-12-05 NOTE — Telephone Encounter (Signed)
Called patient's wife (DPR) back about message. She stated patient had taken his medications at a different time  yesterday and had hotdogs for dinner yesterday. She stated patient's SBP did come down to 130's last night, but it went back up this morning. Advised her to have patient take his medication on his regular schedule and keep a low salt diet. Encouraged her to call back if BP continues to be high.

## 2021-12-05 NOTE — Telephone Encounter (Signed)
Pt c/o BP issue: STAT if pt c/o blurred vision, one-sided weakness or slurred speech  1. What are your last 5 BP readings?   1/31: 176/101  2. Are you having any other symptoms (ex. Dizziness, headache, blurred vision, passed out)?  Had a headache a few days ago   3. What is your BP issue?   Patient states his BP has been steadily increasing. He would like to know if he needs to have his BP medication adjusted. Please advise.

## 2022-01-21 ENCOUNTER — Other Ambulatory Visit: Payer: Self-pay | Admitting: Cardiovascular Disease

## 2022-01-22 NOTE — Telephone Encounter (Signed)
Pt's pharmacy is requesting a refill on albuterol. Would Dr. Nishan like to refill this medication? Please address 

## 2022-02-21 ENCOUNTER — Other Ambulatory Visit: Payer: Self-pay | Admitting: Adult Health

## 2022-03-16 ENCOUNTER — Other Ambulatory Visit: Payer: Self-pay | Admitting: Cardiovascular Disease

## 2022-03-16 DIAGNOSIS — I420 Dilated cardiomyopathy: Secondary | ICD-10-CM

## 2022-03-16 DIAGNOSIS — I10 Essential (primary) hypertension: Secondary | ICD-10-CM

## 2022-04-21 ENCOUNTER — Other Ambulatory Visit: Payer: Self-pay | Admitting: Cardiovascular Disease

## 2022-05-01 ENCOUNTER — Other Ambulatory Visit: Payer: Self-pay | Admitting: Physician Assistant

## 2022-05-01 DIAGNOSIS — Z122 Encounter for screening for malignant neoplasm of respiratory organs: Secondary | ICD-10-CM

## 2022-05-01 DIAGNOSIS — F17200 Nicotine dependence, unspecified, uncomplicated: Secondary | ICD-10-CM

## 2022-05-04 ENCOUNTER — Other Ambulatory Visit: Payer: Self-pay | Admitting: Cardiovascular Disease

## 2022-05-04 ENCOUNTER — Other Ambulatory Visit: Payer: Self-pay | Admitting: *Deleted

## 2022-05-09 ENCOUNTER — Other Ambulatory Visit: Payer: Self-pay

## 2022-05-09 MED ORDER — FUROSEMIDE 20 MG PO TABS: 20.0000 mg | ORAL_TABLET | Freq: Every day | ORAL | 1 refills | Status: DC

## 2022-05-19 ENCOUNTER — Other Ambulatory Visit: Payer: Self-pay | Admitting: Adult Health

## 2022-05-23 ENCOUNTER — Other Ambulatory Visit: Payer: Self-pay | Admitting: Cardiovascular Disease

## 2022-06-04 ENCOUNTER — Other Ambulatory Visit: Payer: Medicare Other

## 2022-06-14 ENCOUNTER — Other Ambulatory Visit: Payer: Self-pay | Admitting: Cardiovascular Disease

## 2022-06-14 DIAGNOSIS — I10 Essential (primary) hypertension: Secondary | ICD-10-CM

## 2022-06-14 DIAGNOSIS — I420 Dilated cardiomyopathy: Secondary | ICD-10-CM

## 2022-06-25 ENCOUNTER — Inpatient Hospital Stay: Admission: RE | Admit: 2022-06-25 | Payer: Medicare Other | Source: Ambulatory Visit

## 2022-06-25 ENCOUNTER — Ambulatory Visit
Admission: RE | Admit: 2022-06-25 | Discharge: 2022-06-25 | Disposition: A | Discharge status: Home / Self Care | Payer: Medicare Other | Source: Ambulatory Visit | Attending: Physician Assistant | Admitting: Physician Assistant

## 2022-06-25 DIAGNOSIS — Z122 Encounter for screening for malignant neoplasm of respiratory organs: Secondary | ICD-10-CM

## 2022-06-25 DIAGNOSIS — F17200 Nicotine dependence, unspecified, uncomplicated: Secondary | ICD-10-CM

## 2022-07-04 ENCOUNTER — Other Ambulatory Visit: Payer: Self-pay | Admitting: Cardiovascular Disease

## 2022-07-23 ENCOUNTER — Other Ambulatory Visit: Payer: Self-pay | Admitting: Cardiovascular Disease

## 2022-08-01 ENCOUNTER — Other Ambulatory Visit: Payer: Self-pay | Admitting: Cardiovascular Disease

## 2022-08-01 NOTE — Telephone Encounter (Signed)
Pt's pharmacy is requesting a refill on albuterol inhaler. Would Dr. Nishan like to refill this medication? Please address 

## 2022-08-23 NOTE — Progress Notes (Signed)
Patient ID: EAN GETTEL, male   DOB: 10-27-1956, 66 y.o.   MRN: [id number]    66 y.o. f/u for lipids and CAD. Previous BMS to RCA and LAD in 2002/2008. 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 Now off plavix per neurology   Had ILR placed by Dr Lovena Le June 2017 No PAF detected  Has had edema on norvasc and back on lasix. 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  All other systems reviewed and negative  General: BP (!) 144/86   Pulse 60   Ht '5\' 8"'$  (1.727 m)   Wt 202 lb 6.4 oz (91.8 kg)   SpO2 98%   BMI 30.77 kg/m   Affect appropriate Healthy:  appears stated age 66: normal Neck supple with no adenopathy JVP normal no bruits no thyromegaly Lungs clear with no wheezing and good diaphragmatic motion Heart:  S1/S2 no murmur, no  rub, gallop or click PMI normal Abdomen: benighn, BS positve, no tenderness, no AAA no bruit.  No HSM or HJR Distal pulses intact with no bruits Plus 2  bilateral edema Neuro non-focal poor balance  Skin warm and dry Poor balance walking with cane     Current Outpatient Medications  Medication Sig Dispense Refill   albuterol (VENTOLIN HFA) 108 (90 Base) MCG/ACT inhaler TAKE 2 PUFFS BY MOUTH EVERY 6 HOURS AS NEEDED FOR WHEEZE OR SHORTNESS OF BREATH 8.5 each 2   atorvastatin (LIPITOR) 80 MG tablet TAKE 1 TABLET BY MOUTH ONCE  DAILY 30 tablet 1   Baclofen 5 MG TABS Take 5 mg by mouth in the morning and at bedtime.     Bempedoic Acid-Ezetimibe (NEXLIZET) 180-10 MG TABS Take 1 tablet by mouth daily. 90 tablet 3   docusate sodium (COLACE) 100 MG capsule Take 100 mg by mouth in the morning and at bedtime.     fenofibrate (TRICOR) 145 MG tablet TAKE 1 TABLET BY MOUTH DAILY 30 tablet 1   furosemide (LASIX) 20 MG tablet TAKE 1 TABLET BY MOUTH DAILY 100 tablet 2   icosapent Ethyl (VASCEPA) 1 g capsule Take 2 capsules (2 g total) by mouth 2 (two) times daily. 360 capsule 3   losartan (COZAAR) 50 MG tablet Take  25 mg by mouth daily.     metoprolol succinate (TOPROL-XL) 25 MG 24 hr tablet Take 12.5 mg by mouth daily.     nitroGLYCERIN (NITROSTAT) 0.4 MG SL tablet Place 1 tablet (0.4 mg total) under the tongue every 5 (five) minutes as needed for chest pain. 25 tablet 3   omeprazole (PRILOSEC) 40 MG capsule Take 40 mg by mouth in the morning.     vitamin B-12 (CYANOCOBALAMIN) 500 MCG tablet Take 500 mcg by mouth in the morning.     No current facility-administered medications for this visit.    Allergies  Hydrocodone  Electrocardiogram:  01/08/19 SR rate 61 anterolateral T wave changes 04/27/20 SR nonspecific lateral T wave changes 08/28/22 SR rate 63 old anterolateral MI with lateral T wave inversions   Assessment and Plan  Cerebellar Stroke:   Plavix d/c by neuro thought to be dissection of  vertebral. Continue cholesterol meds , ASA and non smoking  CAD: Stable with no angina and good activity level.  Continue lopressor and ASA   DCM  euvolemic functional class one  activity limited by cerebellar stroke Some edema better on lasix  EF 39% by MRI 03/02/21 no mural apical thrombus    Chol: [employer] grant on nexlizet and vascepa for now LDL 42 04/30/22   HTN: Well controlled.  Continue current medications and low sodium Dash type diet.    Back Pain:    F/U neurosurgery and Kirsteins   Prostate:  BPH post TURP July 2022  f/u urology   Smoking:  wheezing on exam Albuterol inhaler called in lung cancer screening CT no lesions 06/26/22    F/U in 6 months   Jenkins Rouge

## 2022-08-28 ENCOUNTER — Encounter: Payer: Self-pay | Admitting: Cardiovascular Disease

## 2022-08-28 ENCOUNTER — Ambulatory Visit: Payer: Medicare Other | Attending: Cardiovascular Disease | Admitting: Cardiovascular Disease

## 2022-08-28 ENCOUNTER — Telehealth: Payer: Self-pay

## 2022-08-28 VITALS — BP 144/86 | HR 60 | Ht 68.0 in | Wt 202.4 lb

## 2022-08-28 DIAGNOSIS — R609 Edema, unspecified: Secondary | ICD-10-CM

## 2022-08-28 DIAGNOSIS — F17200 Nicotine dependence, unspecified, uncomplicated: Secondary | ICD-10-CM

## 2022-08-28 DIAGNOSIS — I2510 Atherosclerotic heart disease of native coronary artery without angina pectoris: Secondary | ICD-10-CM

## 2022-08-28 DIAGNOSIS — I420 Dilated cardiomyopathy: Secondary | ICD-10-CM | POA: Diagnosis not present

## 2022-08-28 DIAGNOSIS — E785 Hyperlipidemia, unspecified: Secondary | ICD-10-CM | POA: Diagnosis not present

## 2022-08-28 NOTE — Patient Instructions (Signed)
Medication Instructions:  Your physician recommends that you continue on your current medications as directed. Please refer to the Current Medication list given to you today.  *If you need a refill on your cardiac medications before your next appointment, please call your pharmacy*  Lab Work: If you have labs (blood work) drawn today and your tests are completely normal, you will receive your results only by: MyChart Message (if you have MyChart) OR A paper copy in the mail If you have any lab test that is abnormal or we need to change your treatment, we will call you to review the results.  Testing/Procedures: None ordered today.  Follow-Up: At [facility], you and your health needs are our priority.  As part of our continuing mission to provide you with exceptional heart care, we have created designated Provider Care Teams.  These Care Teams include your primary Cardiologist (physician) and Advanced Practice Providers (APPs -  Physician Assistants and Nurse Practitioners) who all work together to provide you with the care you need, when you need it.  We recommend signing up for the patient portal called "MyChart".  Sign up information is provided on this After Visit Summary.  MyChart is used to connect with patients for Virtual Visits (Telemedicine).  Patients are able to view lab/test results, encounter notes, upcoming appointments, etc.  Non-urgent messages can be sent to your provider as well.   To learn more about what you can do with MyChart, go to [email or web address].    Your next appointment:    6 month(s)  The format for your next appointment:   In Person  Provider:   Peter Nishan, MD     Important Information About Sugar       

## 2022-08-28 NOTE — Telephone Encounter (Signed)
Patient returned call, gave card information.

## 2022-08-28 NOTE — Telephone Encounter (Signed)
Patient is on Vascepa and Nexlizet  and needs assistance. Patient had the [employer] for this past year.

## 2022-08-28 NOTE — Telephone Encounter (Signed)
LMOM for patient to return call.  Updated [employer] grant information.   Pharmacy Card CARD NO. [id number]   CARD STATUS Active   BIN [id number]   PCN [id number]   PC GROUP [id number]   HELP DESK [id number]   PROVIDER PDMI   PROCESSOR PDMI

## 2022-09-10 ENCOUNTER — Other Ambulatory Visit: Payer: Self-pay | Admitting: *Deleted

## 2022-09-10 MED ORDER — METOPROLOL SUCCINATE ER 25 MG PO TB24: 12.5000 mg | ORAL_TABLET | Freq: Every day | ORAL | 3 refills | Status: DC

## 2022-09-18 ENCOUNTER — Other Ambulatory Visit: Payer: Self-pay | Admitting: Cardiovascular Disease

## 2022-09-19 ENCOUNTER — Other Ambulatory Visit: Payer: Self-pay | Admitting: Cardiovascular Disease

## 2022-11-02 ENCOUNTER — Other Ambulatory Visit: Payer: Self-pay | Admitting: Cardiovascular Disease

## 2023-01-04 ENCOUNTER — Other Ambulatory Visit: Payer: Self-pay | Admitting: Cardiovascular Disease

## 2023-01-04 NOTE — Telephone Encounter (Signed)
Pt's pharmacy is requesting a refill on albuterol inhaler. Would Dr. Johnsie Cancel like to refill this medication? Please address

## 2023-03-03 ENCOUNTER — Other Ambulatory Visit: Payer: Self-pay | Admitting: Cardiovascular Disease

## 2023-03-28 ENCOUNTER — Other Ambulatory Visit: Payer: Self-pay | Admitting: Cardiovascular Disease

## 2023-04-02 NOTE — Progress Notes (Signed)
Patient ID: [name], male   DOB: 10-Jan-1956, 67 y.o.   MRN: [id number]    67 y.o. f/u for lipids and CAD. Previous BMS to RCA and LAD in 2002/2008. 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 Now off plavix per neurology   Had ILR placed by Dr Ladona Ridgel June 2017 No PAF detected  Has had edema on norvasc and back on lasix. 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  All other systems reviewed and negative  General: BP (!) 140/88   Pulse 70   Ht 5\' 8"  (1.727 m)   Wt 198 lb 3.2 oz (89.9 kg)   SpO2 97%   BMI 30.14 kg/m   Affect appropriate Healthy:  appears stated age HEENT: normal Neck supple with no adenopathy JVP normal no bruits no thyromegaly Lungs clear with no wheezing and good  diaphragmatic motion Heart:  S1/S2 no murmur, no rub, gallop or click PMI normal Abdomen: benighn, BS positve, no tenderness, no AAA no bruit.  No HSM or HJR Distal pulses intact with no bruits Plus 2  bilateral edema Neuro non-focal poor balance  Skin warm and dry Poor balance walking with cane     Current Outpatient Medications  Medication Sig Dispense Refill   aspirin EC 81 MG tablet Take 1 tablet by mouth daily.     atorvastatin (LIPITOR) 80 MG tablet TAKE 1 TABLET BY MOUTH ONCE  DAILY 90 tablet 3   Baclofen 5 MG TABS Take 5 mg by mouth in the morning and at bedtime.     Bempedoic Acid-Ezetimibe (NEXLIZET) 180-10 MG TABS TAKE 1 TABLET BY MOUTH EVERY DAY 90 tablet 1   docusate sodium (COLACE) 100 MG capsule Take 100 mg by mouth in the morning and at bedtime.     fenofibrate (TRICOR) 145 MG tablet TAKE 1 TABLET BY MOUTH DAILY 90 tablet 3   furosemide (LASIX) 20 MG tablet TAKE 1 TABLET BY MOUTH EVERY DAY 90 tablet 1   icosapent Ethyl (VASCEPA) 1 g capsule TAKE 2 CAPSULES BY MOUTH 2 TIMES DAILY. 360 capsule 1   losartan (COZAAR) 50 MG tablet Take 25 mg by mouth daily.  metoprolol succinate (TOPROL-XL) 25 MG 24 hr tablet Take 0.5 tablets (12.5 mg total) by mouth daily. 45 tablet 3   nitroGLYCERIN (NITROSTAT) 0.4 MG SL tablet Place 1 tablet (0.4 mg total) under the tongue every 5 (five) minutes as needed for chest pain. 25 tablet 3   omeprazole (PRILOSEC) 40 MG capsule Take 40 mg by mouth in the morning.     vitamin B-12 (CYANOCOBALAMIN) 500 MCG tablet Take 500 mcg by mouth in the morning.     losartan (COZAAR) 50 MG tablet TAKE 1 TABLET BY MOUTH DAILY (Patient not taking: Reported on 04/11/2023) 90 tablet 3   No current facility-administered medications for this visit.    Allergies  Hydrocodone  Electrocardiogram:  01/08/19 SR rate 61 anterolateral T wave changes 04/27/20 SR nonspecific lateral T wave changes 08/28/22 SR rate 60 old anterolateral MI with lateral T wave inversions    Assessment and Plan  Cerebellar Stroke:   Plavix d/c by neuro thought to be dissection of vertebral. Continue cholesterol meds , ASA and non smoking  CAD: Stable with no angina and good activity level.  Continue lopressor and ASA   DCM  euvolemic functional class one  activity limited by cerebellar stroke Some edema better on lasix  EF 39% by MRI 03/02/21 no mural apical thrombus    Chol: [employer] grant on nexlizet and vascepa for now LDL 42 04/30/22   HTN: Well controlled.  Continue current medications and low sodium Dash type diet.    Back Pain:    F/U neurosurgery and Kirsteins   Prostate:  BPH post TURP July 2022  f/u urology   Smoking:  wheezing on exam Albuterol inhaler called in lung cancer screening CT no lesions 06/26/22 will do PFTls and refer to [facility] CT in August  PFTls Refer [place] pulmonary CT screen August 2024  F/U in 6 months   Charlton Haws

## 2023-04-11 ENCOUNTER — Ambulatory Visit: Payer: Medicare Other | Attending: Cardiovascular Disease | Admitting: Cardiovascular Disease

## 2023-04-11 ENCOUNTER — Encounter: Payer: Self-pay | Admitting: Cardiovascular Disease

## 2023-04-11 VITALS — BP 140/88 | HR 70 | Ht 68.0 in | Wt 198.2 lb

## 2023-04-11 DIAGNOSIS — F17200 Nicotine dependence, unspecified, uncomplicated: Secondary | ICD-10-CM

## 2023-04-11 DIAGNOSIS — I2510 Atherosclerotic heart disease of native coronary artery without angina pectoris: Secondary | ICD-10-CM

## 2023-04-11 DIAGNOSIS — I10 Essential (primary) hypertension: Secondary | ICD-10-CM | POA: Diagnosis not present

## 2023-04-11 DIAGNOSIS — I420 Dilated cardiomyopathy: Secondary | ICD-10-CM | POA: Diagnosis not present

## 2023-04-11 NOTE — Patient Instructions (Signed)
Medication Instructions:  Your physician recommends that you continue on your current medications as directed. Please refer to the Current Medication list given to you today.  *If you need a refill on your cardiac medications before your next appointment, please call your pharmacy*  Lab Work: If you have labs (blood work) drawn today and your tests are completely normal, you will receive your results only by: MyChart Message (if you have MyChart) OR A paper copy in the mail If you have any lab test that is abnormal or we need to change your treatment, we will call you to review the results.  Testing/Procedures: Your physician has recommended that you have a pulmonary function test. Pulmonary Function Tests are a group of tests that measure how well air moves in and out of your lungs.  Non-Cardiac CT scanning for lung cancer screening in August, (CAT scanning), is a noninvasive, special x-ray that produces cross-sectional images of the body using x-rays and a computer. CT scans help physicians diagnose and treat medical conditions. For some CT exams, a contrast material is used to enhance visibility in the area of the body being studied. CT scans provide greater clarity and reveal more details than regular x-ray exams.  Follow-Up: At [facility], you and your health needs are our priority.  As part of our continuing mission to provide you with exceptional heart care, we have created designated Provider Care Teams.  These Care Teams include your primary Cardiologist (physician) and Advanced Practice Providers (APPs -  Physician Assistants and Nurse Practitioners) who all work together to provide you with the care you need, when you need it.  We recommend signing up for the patient portal called "MyChart".  Sign up information is provided on this After Visit Summary.  MyChart is used to connect with patients for Virtual Visits (Telemedicine).  Patients are able to view lab/test results,  encounter notes, upcoming appointments, etc.  Non-urgent messages can be sent to your provider as well.   To learn more about what you can do with MyChart, go to [email or web address].    Your next appointment:   6 month(s)  Provider:   Charlton Haws, MD

## 2023-04-29 ENCOUNTER — Ambulatory Visit (HOSPITAL_COMMUNITY)
Admission: RE | Admit: 2023-04-29 | Discharge: 2023-04-29 | Disposition: A | Discharge status: Home / Self Care | Payer: Medicare Other | Source: Ambulatory Visit | Attending: Cardiovascular Disease | Admitting: Cardiovascular Disease

## 2023-04-29 DIAGNOSIS — I2510 Atherosclerotic heart disease of native coronary artery without angina pectoris: Secondary | ICD-10-CM | POA: Insufficient documentation

## 2023-04-29 DIAGNOSIS — J449 Chronic obstructive pulmonary disease, unspecified: Secondary | ICD-10-CM | POA: Diagnosis not present

## 2023-04-29 DIAGNOSIS — I10 Essential (primary) hypertension: Secondary | ICD-10-CM | POA: Diagnosis not present

## 2023-04-29 DIAGNOSIS — R942 Abnormal results of pulmonary function studies: Secondary | ICD-10-CM | POA: Insufficient documentation

## 2023-04-29 DIAGNOSIS — F17210 Nicotine dependence, cigarettes, uncomplicated: Secondary | ICD-10-CM | POA: Insufficient documentation

## 2023-04-29 DIAGNOSIS — R059 Cough, unspecified: Secondary | ICD-10-CM | POA: Diagnosis not present

## 2023-04-29 DIAGNOSIS — I420 Dilated cardiomyopathy: Secondary | ICD-10-CM | POA: Diagnosis present

## 2023-04-29 DIAGNOSIS — R0609 Other forms of dyspnea: Secondary | ICD-10-CM | POA: Diagnosis not present

## 2023-04-29 DIAGNOSIS — F17200 Nicotine dependence, unspecified, uncomplicated: Secondary | ICD-10-CM | POA: Insufficient documentation

## 2023-04-29 LAB — PULMONARY FUNCTION TEST
DL/VA % pred: 94 %
DL/VA: 3.93 ml/min/mmHg/L
DLCO unc % pred: 68 %
DLCO unc: 17.07 ml/min/mmHg
FEF 25-75 Post: 0.53 L/sec
FEF 25-75 Pre: 1.28 L/sec
FEF2575-%Change-Post: -58 %
FEF2575-%Pred-Post: 21 %
FEF2575-%Pred-Pre: 52 %
FEV1-%Change-Post: -22 %
FEV1-%Pred-Post: 50 %
FEV1-%Pred-Pre: 64 %
FEV1-Post: 1.56 L
FEV1-Pre: 2.02 L
FEV1FVC-%Change-Post: -10 %
FEV1FVC-%Pred-Pre: 89 %
FEV6-%Change-Post: -13 %
FEV6-%Pred-Post: 65 %
FEV6-%Pred-Pre: 75 %
FEV6-Post: 2.59 L
FEV6-Pre: 2.98 L
FEV6FVC-%Change-Post: 0 %
FEV6FVC-%Pred-Post: 104 %
FEV6FVC-%Pred-Pre: 104 %
FVC-%Change-Post: -13 %
FVC-%Pred-Post: 62 %
FVC-%Pred-Pre: 72 %
FVC-Post: 2.63 L
FVC-Pre: 3.03 L
Post FEV1/FVC ratio: 59 %
Post FEV6/FVC ratio: 98 %
Pre FEV1/FVC ratio: 67 %
Pre FEV6/FVC Ratio: 98 %
RV % pred: 131 %
RV: 2.99 L
TLC % pred: 88 %
TLC: 5.88 L

## 2023-04-29 MED ORDER — ALBUTEROL SULFATE (2.5 MG/3ML) 0.083% IN NEBU
2.5000 mg | INHALATION_SOLUTION | Freq: Once | RESPIRATORY_TRACT | Status: AC
  Administered 2023-04-29: 2.5 mg via RESPIRATORY_TRACT

## 2023-05-26 ENCOUNTER — Other Ambulatory Visit: Payer: Self-pay | Admitting: Cardiovascular Disease

## 2023-06-01 ENCOUNTER — Other Ambulatory Visit: Payer: Self-pay | Admitting: Cardiovascular Disease

## 2023-06-02 ENCOUNTER — Other Ambulatory Visit: Payer: Self-pay | Admitting: Cardiovascular Disease

## 2023-06-12 ENCOUNTER — Encounter: Payer: Self-pay | Admitting: Pulmonary Disease

## 2023-06-12 ENCOUNTER — Ambulatory Visit: Payer: Medicare Other | Admitting: Pulmonary Disease

## 2023-06-12 VITALS — BP 122/82 | HR 71 | Temp 97.1°F | Ht 68.0 in | Wt 200.4 lb

## 2023-06-12 DIAGNOSIS — Z720 Tobacco use: Secondary | ICD-10-CM

## 2023-06-12 DIAGNOSIS — R942 Abnormal results of pulmonary function studies: Secondary | ICD-10-CM | POA: Diagnosis not present

## 2023-06-12 DIAGNOSIS — J432 Centrilobular emphysema: Secondary | ICD-10-CM | POA: Diagnosis not present

## 2023-06-12 NOTE — Patient Instructions (Signed)
Nice to meet you  No new medications since her symptoms are not bothersome  If worsening shortness of breath develops let me know and we should consider a different longer acting inhaler  Continue albuterol as needed  I will send a message in referral to get you connected with our clinic for ongoing lung cancer screening in the future  Return to clinic as needed

## 2023-06-12 NOTE — Progress Notes (Signed)
@Patient  ID: [name], male    DOB: [birth year], 67 y.o.   MRN: [id number]  Chief Complaint  Patient presents with   Consult    PT HAS NO COMPLAINTS TODAY    Referring provider: Wendall Stade, MD  HPI:   67 y.o. man whom we are seeing for evaluation of emphysema and tobacco abuse.  Multiple cardiology notes reviewed.  Overall he feels well.  He denies any significant dyspnea exertion.  I pushed on this and states that he gets a little short of breath with inclines and long walks.  Most issue with inclines and stairs is more related to musculoskeletal pain and dyspnea.  He denies significant dyspnea exertion.  He works in the yard regularly and denies significant symptoms.  He continues to smoke.  About a pack a day.  Discussed strategies for tobacco cessation.  Reviewed his CT scan most recently 06/2022.  Mild emphysematous changes, otherwise clear lung.  No nodules noted.  Reviewed most recent TTE 2022 there is a EF of 45%, cardiomyopathy.  Reviewed recent PFT 04/2023 in detail.  Discussed at length with patient and wife today.    Questionaires / Pulmonary Flowsheets:   ACT:      No data to display          MMRC:     No data to display          Epworth:      No data to display          Tests:   FENO:  No results found for: "NITRICOXIDE"  PFT:    Latest Ref Rng & Units 04/29/2023    9:50 AM 12/30/2013    8:38 AM  PFT Results  FVC-Pre L 3.03  4.05   FVC-Predicted Pre % 72  89   FVC-Post L 2.63  4.10   FVC-Predicted Post % 62  91   Pre FEV1/FVC % % 67  73   Post FEV1/FCV % % 59  72   FEV1-Pre L 2.02  2.96   FEV1-Predicted Pre % 64  86   FEV1-Post L 1.56  2.95   DLCO uncorrected ml/min/mmHg 17.07  20.02   DLCO UNC% % 68  67   DLCO corrected ml/min/mmHg  20.02   DLCO COR %Predicted %  67   DLVA Predicted % 94  79   TLC L 5.88  6.61   TLC % Predicted % 88  100   RV % Predicted % 131  123   Personally reviewed and interpreted as spirometry  just of air trapping versus restriction, no fixed obstruction, no bronchodilator response, lung volumes consistent with air trapping, no hyperinflation, DLCO moderately reduced  WALK:      No data to display          Imaging: Personally reviewed and as per EMR and discussion in this note No results found.  Lab Results: Personally reviewed CBC    Component Value Date/Time   WBC 7.7 04/26/2021 0930   RBC 5.30 04/26/2021 0930   HGB 11.7 (L) 05/11/2021 0433   HGB 14.6 04/29/2020 0749   HCT 36.6 (L) 05/11/2021 0433   HCT 47.2 04/29/2020 0749   PLT 155 04/26/2021 0930   PLT 195 04/29/2020 0749   MCV 91.5 04/26/2021 0930   MCV 90 04/29/2020 0749   MCH 29.4 04/26/2021 0930   MCHC 32.2 04/26/2021 0930   RDW 13.6 04/26/2021 0930   RDW 12.3 04/29/2020 0749   LYMPHSABS 2.3 04/29/2020  0749   MONOABS 0.5 03/16/2016 0958   EOSABS 0.6 (H) 04/29/2020 0749   BASOSABS 0.1 04/29/2020 0749    BMET    Component Value Date/Time   NA 141 06/23/2021 0918   K 4.2 06/23/2021 0918   CL 103 06/23/2021 0918   CO2 23 06/23/2021 0918   GLUCOSE 94 06/23/2021 0918   GLUCOSE 77 05/10/2021 1302   BUN 12 06/23/2021 0918   CREATININE 1.00 06/23/2021 0918   CALCIUM 9.1 06/23/2021 0918   GFRNONAA >60 04/26/2021 0930   GFRAA 83 08/31/2020 0756    BNP No results found for: "BNP"  ProBNP    Component Value Date/Time   PROBNP 118 03/08/2017 1213    Specialty Problems       Pulmonary Problems   DYSPNEA    Qualifier: Diagnosis of  By: Connye Burkitt        Sleep apnea    Allergies  Allergen Reactions   Hydrocodone Other (See Comments)    Throat itchy, weakness- took this during stroke not sure if caused problem  throat itchy, weakness    Immunization History  Administered Date(s) Administered   Moderna Sars-Covid-2 Vaccination 01/22/2020, 02/23/2020    Past Medical History:  Diagnosis Date   Arthritis    BPH (benign prostatic hyperplasia)    CAD (coronary artery  disease)    a. s/p prior MI; hx of stent to RCA and LAD;  b. LHC 12/2006:  LM 20%, pLAD 40%, mLAD stent ok, apical LAD 90% (1.5-81mm vessel), oD1 40-50%, AV groove CFX 30%, oOM1 30%, pRCA 30% (multiple), mRCA stent ok with 30-40% ISR, dRCA 30%, dAnt, inf-apical severe HK, EF 40% => med Rx.;  12/2013 Cath: LM nl, LAD 95ost, 35m ISR, 99apex, LCX nl, RCA 40p, 100 ISR, L->R collats, EF 35-40%-->pending CABG   Chronic back pain    lumbar compression fracture and stenosis   Clavicle fracture    Cryptogenic stroke [facility])    ED (erectile dysfunction)    GERD (gastroesophageal reflux disease)    occ   Hypertension    takes Losartan,HCTZ,  and Metoprolol daily   Ischemic cardiomyopathy    a. Echo 09/2012:  Mod LVH, focal basal hypertrophy, EF 30-35%, mid to dist inf-lat HK, Gr 1 diast dysfn, mild to mod LAE   Mixed hyperlipidemia    takes Atorvastatin daily   Myocardial infarction [facility]) 2015   x2   Peripheral vascular disease (HCC)    Scaphoid non-union advanced collapse    Sleep apnea    not using cpap at present had new test and getting new cpap   Stroke (HCC) 03/2016   Urinary frequency    takes Flomax daily   Vitamin B12 deficiency     Tobacco History: Social History   Tobacco Use  Smoking Status Every Day   Current packs/day: 1.00   Average packs/day: 1 pack/day for 20.0 years (20.0 ttl pk-yrs)   Types: Cigarettes  Smokeless Tobacco Never   Ready to quit: Not Answered Counseling given: Not Answered    Outpatient Encounter Medications as of 06/12/2023  Medication Sig   aspirin EC 81 MG tablet Take 1 tablet by mouth daily.   atorvastatin (LIPITOR) 80 MG tablet TAKE 1 TABLET BY MOUTH ONCE  DAILY   Baclofen 5 MG TABS Take 5 mg by mouth in the morning and at bedtime.   Bempedoic Acid-Ezetimibe (NEXLIZET) 180-10 MG TABS TAKE 1 TABLET BY MOUTH EVERY DAY   docusate sodium (COLACE) 100 MG capsule Take 100 mg  by mouth in the morning and at bedtime.   fenofibrate (TRICOR) 145 MG tablet  TAKE 1 TABLET BY MOUTH DAILY   furosemide (LASIX) 20 MG tablet TAKE 1 TABLET BY MOUTH EVERY DAY   icosapent Ethyl (VASCEPA) 1 g capsule TAKE 2 CAPSULES BY MOUTH TWICE A DAY   losartan (COZAAR) 50 MG tablet TAKE 1 TABLET BY MOUTH DAILY   metoprolol succinate (TOPROL-XL) 25 MG 24 hr tablet TAKE ONE-HALF TABLET BY MOUTH  DAILY   omeprazole (PRILOSEC) 40 MG capsule Take 40 mg by mouth in the morning.   vitamin B-12 (CYANOCOBALAMIN) 500 MCG tablet Take 500 mcg by mouth in the morning.   nitroGLYCERIN (NITROSTAT) 0.4 MG SL tablet Place 1 tablet (0.4 mg total) under the tongue every 5 (five) minutes as needed for chest pain.   [DISCONTINUED] losartan (COZAAR) 50 MG tablet Take 25 mg by mouth daily.   No facility-administered encounter medications on file as of 06/12/2023.     Review of Systems  Review of Systems  No chest pain exertion.  No orthopnea or PND.  Comprehensive review of systems otherwise negative. Physical Exam  BP 122/82 (BP Location: Left Arm, Cuff Size: Normal)   Pulse 71   Temp (!) 97.1 F (36.2 C) (Oral)   Ht 5\' 8"  (1.727 m)   Wt 200 lb 6.4 oz (90.9 kg)   SpO2 97%   BMI 30.47 kg/m   Wt Readings from Last 5 Encounters:  06/12/23 200 lb 6.4 oz (90.9 kg)  04/11/23 198 lb 3.2 oz (89.9 kg)  08/28/22 202 lb 6.4 oz (91.8 kg)  09/06/21 214 lb 14.4 oz (97.5 kg)  06/02/21 203 lb (92.1 kg)    BMI Readings from Last 5 Encounters:  06/12/23 30.47 kg/m  04/11/23 30.14 kg/m  08/28/22 30.77 kg/m  09/06/21 32.68 kg/m  06/02/21 30.87 kg/m     Physical Exam General: Sitting in chair, no acute distress Eyes: EOMI, no icterus Neck: Supple, no JVP appreciated Pulmonary: Clear, distant Cardiovascular: Warm, trace edema lower extremities, ready extremities Abdomen: Nondistended, bowel sounds present MSK: No synovitis, no joint effusion Neuro: Walks with assistance of cane, difficult deficits Psych: Normal mood, full affect   Assessment & Plan:   Emphysema: as a  result of cigarette smoking. PFTs with air trapping, no fixed obstruction. Suspect would benefit from bronchodilators. However, he has minimal subjective symptoms. Therefore, no new medications today.   Moderately reduced DLCO: Suspect related to emphysema seen on CT scan.  Tobacco abuse: Continue lung cancer screening encouraged to schedule scan with a small 06/2023 1 year interval from last scan.  Will refer to Kandice Robinsons, NP and our office for ongoing lung cancer screening.  Smoking assessment and cessation counseling I have advised the patient to quit/stop smoking as soon as possible due to high risk for multiple medical problems.  It will also be very difficult for [place] to manage patient's  respiratory symptoms and status if we continue to expose her lungs to a known irritant.  Patient is willing to quit smoking. I have advised the patient that we can assist and have options of nicotine replacement therapy, provided smoking cessation education today, provided smoking cessation counseling, and provided cessation resources. Follow-up next office visit office visit for assessment of smoking cessation.  I spent 30 minutes of tobacco cessation counseling.   Return if symptoms worsen or fail to improve, for f/u Dr. Judeth Horn.   Karren Burly, MD 06/12/2023   This appointment required 64 minutes of  patient care (this includes precharting, chart review, review of results, face-to-face care, etc.).

## 2023-06-16 ENCOUNTER — Other Ambulatory Visit: Payer: Self-pay | Admitting: Adult Health

## 2023-06-19 ENCOUNTER — Other Ambulatory Visit: Payer: Self-pay | Admitting: Cardiovascular Disease

## 2023-06-24 ENCOUNTER — Telehealth: Payer: Self-pay | Admitting: *Deleted

## 2023-06-24 ENCOUNTER — Telehealth: Payer: Self-pay | Admitting: Pulmonary Disease

## 2023-06-24 ENCOUNTER — Other Ambulatory Visit: Payer: Self-pay

## 2023-06-24 NOTE — Telephone Encounter (Signed)
Message left for refill on baclofen Called back and notified pt will need f/u as he has not been seen in 2 years. Made aware pcp could fill or patient can make an appt here. She will ask him which he prefers.

## 2023-06-24 NOTE — Telephone Encounter (Signed)
Pt wife calling in for a albuterol Hfa for Pt  Pharmacy: [facility] [place]

## 2023-06-26 ENCOUNTER — Other Ambulatory Visit: Payer: Self-pay | Admitting: Cardiovascular Disease

## 2023-06-26 NOTE — Telephone Encounter (Signed)
Pt wife calling in for a albuterol Hfa for Pt  Pharmacy: [facility] [place]

## 2023-06-27 MED ORDER — ALBUTEROL SULFATE HFA 108 (90 BASE) MCG/ACT IN AERS: 2.0000 | INHALATION_SPRAY | Freq: Four times a day (QID) | RESPIRATORY_TRACT | 3 refills | Status: DC | PRN

## 2023-06-27 NOTE — Telephone Encounter (Signed)
Albuterol rs sent to pharmacy.

## 2023-07-22 ENCOUNTER — Ambulatory Visit: Payer: Medicare Other | Admitting: Neurology

## 2023-08-13 ENCOUNTER — Ambulatory Visit (HOSPITAL_BASED_OUTPATIENT_CLINIC_OR_DEPARTMENT_OTHER)
Admission: RE | Admit: 2023-08-13 | Discharge: 2023-08-13 | Disposition: A | Discharge status: Home / Self Care | Payer: Medicare Other | Source: Ambulatory Visit | Attending: Cardiovascular Disease | Admitting: Cardiovascular Disease

## 2023-08-13 DIAGNOSIS — I2510 Atherosclerotic heart disease of native coronary artery without angina pectoris: Secondary | ICD-10-CM | POA: Insufficient documentation

## 2023-08-13 DIAGNOSIS — I420 Dilated cardiomyopathy: Secondary | ICD-10-CM | POA: Insufficient documentation

## 2023-08-13 DIAGNOSIS — F17210 Nicotine dependence, cigarettes, uncomplicated: Secondary | ICD-10-CM | POA: Diagnosis not present

## 2023-08-13 DIAGNOSIS — I10 Essential (primary) hypertension: Secondary | ICD-10-CM | POA: Insufficient documentation

## 2023-08-13 DIAGNOSIS — F17200 Nicotine dependence, unspecified, uncomplicated: Secondary | ICD-10-CM | POA: Insufficient documentation

## 2023-08-29 ENCOUNTER — Telehealth: Payer: Self-pay

## 2023-08-29 DIAGNOSIS — E785 Hyperlipidemia, unspecified: Secondary | ICD-10-CM

## 2023-08-29 DIAGNOSIS — F17200 Nicotine dependence, unspecified, uncomplicated: Secondary | ICD-10-CM

## 2023-08-29 NOTE — Telephone Encounter (Signed)
-----   Message from Charlton Haws sent at 08/28/2023  3:47 PM EDT ----- Emphysema no cancer f/u CT in a year

## 2023-08-29 NOTE — Telephone Encounter (Signed)
The patient has been notified of the result and verbalized understanding.  All questions (if any) were answered. Ethelda Chick, RN 08/29/2023 9:50 AM

## 2023-09-06 ENCOUNTER — Telehealth: Payer: Self-pay | Admitting: Cardiovascular Disease

## 2023-09-06 NOTE — Telephone Encounter (Signed)
Spoke with patient and he states he needs to reapply for the health well fgrant and the pharmD usually does it for him

## 2023-09-06 NOTE — Telephone Encounter (Signed)
Pt c/o medication issue:  1. Name of Medication:  icosapent Ethyl (VASCEPA) 1 g capsule Bempedoic Acid-Ezetimibe (NEXLIZET) 180-10 MG TABS  2. How are you currently taking this medication (dosage and times per day)?   3. Are you having a reaction (difficulty breathing--STAT)?   4. What is your medication issue?   Patient's spouse is requesting to reapply for [employer]well [employer] grant.

## 2023-09-07 NOTE — Telephone Encounter (Signed)
[facility] renewed  Card No. [id number]  BIN [id number]  PCN [id number]  PC Group [id number]

## 2023-09-09 NOTE — Telephone Encounter (Signed)
Spoke to wife (DPR) and gave pharmacy card information

## 2023-09-30 NOTE — Progress Notes (Signed)
Patient ID: [name], male   DOB: [birth year], 67 y.o.   MRN: [id number]    67 y.o. f/u for lipids and CAD. Previous BMS to RCA and LAD in 2002/2008. 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 Now off plavix per neurology   Had ILR placed by Dr Ladona Ridgel June 2017 No PAF detected  Has had edema on norvasc and back on lasix. 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    Post TURP 05/11/21 d/c from hospital with foley   Back issues and leg spasms Lumbar injections Dr Wynn Banker  And Rx with baclofen referred to neurosurgery   Has [employer] grant  for  Vascepa/Nexlizet    July 2022 hydrodiuril changed to lasix for LE edema    He has no chest pain Has had flu shot and pneumonia shot CT 08/2023 no lung cancer and has established with Dr Judeth Horn in pulmonary  No angina Still needs to lose some weight  ROS: Denies fever, malais, weight loss, blurry vision, decreased visual acuity, cough, sputum, SOB, hemoptysis, pleuritic pain, palpitaitons, heartburn, abdominal pain, melena, lower extremity edema, claudication, or rash.  All other systems reviewed and negative  General: BP 134/82   Pulse 66   Ht 5\' 8"  (1.727 m)   Wt 202 lb (91.6 kg)   SpO2 97%   BMI 30.71 kg/m   Affect appropriate Healthy:  appears stated age HEENT: normal Neck supple with no adenopathy JVP normal no  bruits no thyromegaly Lungs clear with no wheezing and good diaphragmatic motion Heart:  S1/S2 no murmur, no rub, gallop or click PMI normal Abdomen: benighn, BS positve, no tenderness, no AAA no bruit.  No HSM or HJR Distal pulses intact with no bruits Plus 2  bilateral edema Neuro non-focal poor balance  Skin warm and dry Poor balance walking with cane     Current Outpatient Medications  Medication Sig Dispense Refill   albuterol (VENTOLIN HFA) 108 (90 Base) MCG/ACT inhaler Inhale 2 puffs into the lungs every 6 (six) hours as needed for wheezing or shortness of breath. 18 g 3   aspirin EC 81 MG tablet Take 1 tablet by mouth daily.     atorvastatin (LIPITOR) 80 MG tablet TAKE 1 TABLET BY MOUTH ONCE  DAILY 100 tablet 2   Baclofen 5 MG TABS Take 5 mg by mouth in the morning and at bedtime.     Bempedoic Acid-Ezetimibe (NEXLIZET) 180-10 MG TABS TAKE 1 TABLET BY MOUTH EVERY DAY 90 tablet 3   docusate sodium (COLACE) 100 MG capsule Take 100 mg by mouth in the morning and at bedtime.     fenofibrate (TRICOR) 145 MG tablet TAKE 1 TABLET BY MOUTH DAILY 100 tablet 2   furosemide (LASIX) 20 MG tablet TAKE 1 TABLET BY  MOUTH DAILY 90 tablet 2   icosapent Ethyl (VASCEPA) 1 g capsule TAKE 2 CAPSULES BY MOUTH TWICE A DAY 360 capsule 3   losartan (COZAAR) 50 MG tablet TAKE 1 TABLET BY MOUTH DAILY (Patient taking differently: Take 25 mg by mouth daily.) 100 tablet 2   metoprolol succinate (TOPROL-XL) 25 MG 24 hr tablet TAKE ONE-HALF TABLET BY MOUTH  DAILY 45 tablet 3   nitroGLYCERIN (NITROSTAT) 0.4 MG SL tablet Place 1 tablet (0.4 mg total) under the tongue every 5 (five) minutes as needed for chest pain. 25 tablet 3   omeprazole (PRILOSEC) 40 MG capsule Take 40 mg by mouth in the morning.     vitamin B-12 (CYANOCOBALAMIN) 500 MCG tablet Take 500 mcg by mouth in the morning.     No current facility-administered medications for this visit.    Allergies  Hydrocodone  Electrocardiogram:  10/14/2023  SR rate 66 chronic T wave inversions V3-6   Assessment and Plan  Cerebellar Stroke:   Plavix d/c by neuro thought to be dissection of vertebral. Continue cholesterol meds , ASA and non smoking  CAD:/CABG 2015 with [facility]:  Stable with no angina and good activity level.  Continue lopressor and ASA   DCM  euvolemic functional class one  activity limited by cerebellar stroke Some edema better on lasix  EF 39% by MRI 03/02/21 no mural apical thrombus    Chol: [employer] grant on nexlizet and vascepa for now LDL 42 04/30/22   HTN: Well controlled.  Continue current medications and low sodium Dash type diet.    Back Pain:    F/U neurosurgery and [facility]   Prostate:  BPH post TURP July 2022  f/u urology   Smoking:  wheezing on exam Albuterol inhaler called in lung cancer screening CT no lesions 08/28/23 F/U [facility] pulmonary DLCO ok on PFTls     F/U in 6 months   Charlton Haws

## 2023-10-14 ENCOUNTER — Encounter: Payer: Self-pay | Admitting: Cardiovascular Disease

## 2023-10-14 ENCOUNTER — Other Ambulatory Visit: Payer: Self-pay

## 2023-10-14 ENCOUNTER — Ambulatory Visit: Payer: Medicare Other | Attending: Cardiovascular Disease | Admitting: Cardiovascular Disease

## 2023-10-14 VITALS — BP 134/82 | HR 66 | Ht 68.0 in | Wt 202.0 lb

## 2023-10-14 DIAGNOSIS — F17200 Nicotine dependence, unspecified, uncomplicated: Secondary | ICD-10-CM | POA: Diagnosis not present

## 2023-10-14 DIAGNOSIS — I2510 Atherosclerotic heart disease of native coronary artery without angina pectoris: Secondary | ICD-10-CM | POA: Diagnosis not present

## 2023-10-14 DIAGNOSIS — E785 Hyperlipidemia, unspecified: Secondary | ICD-10-CM

## 2023-10-14 MED ORDER — LOSARTAN POTASSIUM 25 MG PO TABS: 25.0000 mg | ORAL_TABLET | Freq: Every day | ORAL | 3 refills | Status: DC

## 2023-10-14 NOTE — Patient Instructions (Addendum)
Medication Instructions:  Your physician recommends that you continue on your current medications as directed. Please refer to the Current Medication list given to you today.  *If you need a refill on your cardiac medications before your next appointment, please call your pharmacy*  Lab Work: If you have labs (blood work) drawn today and your tests are completely normal, you will receive your results only by: MyChart Message (if you have MyChart) OR A paper copy in the mail If you have any lab test that is abnormal or we need to change your treatment, we will call you to review the results.  Testing/Procedures: None ordered today.  Follow-Up: At [facility], you and your health needs are our priority.  As part of our continuing mission to provide you with exceptional heart care, we have created designated Provider Care Teams.  These Care Teams include your primary Cardiologist (physician) and Advanced Practice Providers (APPs -  Physician Assistants and Nurse Practitioners) who all work together to provide you with the care you need, when you need it.  We recommend signing up for the patient portal called "MyChart".  Sign up information is provided on this After Visit Summary.  MyChart is used to connect with patients for Virtual Visits (Telemedicine).  Patients are able to view lab/test results, encounter notes, upcoming appointments, etc.  Non-urgent messages can be sent to your provider as well.   To learn more about what you can do with MyChart, go to [email or web address].    Your next appointment:   6 month(s)  Provider:   Charlton Haws, MD     Your physician recommends that you schedule a follow-up appointment in: January or February with Pharmacist.

## 2023-11-13 ENCOUNTER — Other Ambulatory Visit: Payer: Self-pay | Admitting: Pulmonary Disease

## 2023-11-20 ENCOUNTER — Ambulatory Visit: Payer: Medicare Other | Attending: Cardiology | Admitting: Pharmacist

## 2023-11-20 VITALS — BP 164/96 | HR 67

## 2023-11-20 DIAGNOSIS — F17210 Nicotine dependence, cigarettes, uncomplicated: Secondary | ICD-10-CM | POA: Diagnosis not present

## 2023-11-20 DIAGNOSIS — I10 Essential (primary) hypertension: Secondary | ICD-10-CM | POA: Diagnosis not present

## 2023-11-20 NOTE — Patient Instructions (Addendum)
 Your blood pressure goal is < 130/1mmHg   Please start checking blood pressure at home Please call me at [phone] with your blood pressure readings in about 1 week Continue losartan  25 mg daily and metoprolol  succinate 12.5 mg daily  Important lifestyle changes to control high blood pressure  Intervention  Effect on the BP   Weight loss Weight loss is one of the most effective lifestyle changes for controlling blood pressure. If you're overweight or obese, losing even a small amount of weight can help reduce blood pressure.    Blood pressure can decrease by 1 millimeter of mercury (mmHg) with each kilogram (about 2.2 pounds) of weight lost.   Exercise regularly As a general goal, aim for 30 minutes of moderate physical activity every day.    Regular physical activity can lower blood pressure by 5 - 8 mmHg.   Eat a healthy diet Eat a diet rich in whole grains, fruits, vegetables, lean meat, and low-fat dairy products. Limit processed foods, saturated fat, and sweets.    A heart-healthy diet can lower high blood pressure by 10 mmHg.   Reduce salt (sodium) in your diet Aim for 000mg  of sodium each day. Avoid deli meats, canned food, and frozen microwave meals which are high in sodium.     Limiting sodium can reduce blood pressure by 5 mmHg.   Limit alcohol One drink equals 12 ounces of beer, 5 ounces of wine, or 1.5 ounces of 80-proof liquor.    Limiting alcohol to < 1 drink a day for women or < 2 drinks a day for men can help lower blood pressure by about 4 mmHg.   To check your pressure at home you will need to:   Sit up in a chair, with feet flat on the floor and back supported. Do not cross your ankles or legs. Rest your left arm so that the cuff is about heart level. If the cuff goes on your upper arm, then just relax your arm on the table, arm of the chair, or your lap. If you have a wrist cuff, hold your wrist against your chest at heart level. Place the cuff  snugly around your arm, about 1 inch above the crease of your elbow. The cords should be inside the groove of your elbow.  Sit quietly, with the cuff in place, for about 5 minutes. Then press the power button to start a reading. Do not talk or move while the reading is taking place.  Record your readings on a sheet of paper. Although most cuffs have a memory, it is often easier to see a pattern developing when the numbers are all in front of you.  You can repeat the reading after 1-3 minutes if it is recommended.   Make sure your bladder is empty and you have not had caffeine or tobacco within the last 30 minutes   Always bring your blood pressure log with you to your appointments. If you have not brought your monitor in to be double checked for accuracy, please bring it to your next appointment.   You can find a list of validated (accurate) blood pressure cuffs at: validatebp.org

## 2023-11-20 NOTE — Assessment & Plan Note (Signed)
 Assessment: Blood pressure in clinic is excessively elevated today Has not been checking at home recently Blood pressure at last few doctors visits just slightly above goal ~137/82  Drinks a little more caffeine than recommended Has not been as active lately with the colder weather  Plan: I have asked him to start checking his blood pressure at home and call me with readings in 1 week If blood pressure is elevated at home as well will either increase losartan  or add an additional medication (would probably consider adding back hydrochlorothiazide ) Follow-up in clinic in 1 month Increase physical activity

## 2023-11-20 NOTE — Progress Notes (Signed)
 Patient ID: [name]                 DOB: 09-05-1956                      MRN: [id number]      HPI: [name] is a 68 y.o. male referred by Dr. Stann Earnest to HTN clinic. PMH is significant for CAD status post CABG in 2015, CVA, hypertension, hyperlipidemia, CHF EF 40 to 45% in 2020, improved to 40 to 50% in 2022.  Tobacco use.  Seen by Dr. Stann Earnest in December 2024.  Blood pressure at that visit 134/82.  Previously had issues with low blood pressure.  Patient presents today to clinic accompanied by his significant other.  He has not been checking blood pressure at home recently.  Denies any dizziness or lightheadedness.  Some swelling in his feet but not bad.  He is taking his furosemide  daily.  Not interested in quitting smoking.  His significant other is working on cutting back and is hoping that he will also do the same along with her.  She does not cook with salt but he does add salt to food.  Does not eat out often.  He has not been as active as he would like due to the cold weather.  They do have gym equipment inside but need to clean the room out.  Drinks 2 large cups of coffee a day.   Had question about health will grant.  Concerned that grant was only for $2500 and concerned it would not be enough.  Explained that grant pays the co-pay not the full cost of the medication.  Out-of-pocket max for Medicare this year is $2000 therefore there will certainly be enough money on the grant.   Current HTN meds: losartan  25 mg daily, metoprolol  succinate 12.5 mg daily Previously tried: hydrochlorothiazide , ramipril  BP goal: <130/80  Family History:  Family History  Problem Relation Age of Onset   Heart disease Father    Heart disease Maternal Grandmother    Heart disease Maternal Grandfather    Heart disease Paternal Grandmother    Heart disease Paternal Grandfather     Social History: + tobacco (not interested in quitting) no ETOH  Diet: rarely eats out, does not cook with salt  but will add some 2-3 cups coffee daily   Exercise:  Tries to walk around the house and some chair exercises More active when the weather is warmer outside  Home BP readings:  none  Wt Readings from Last 3 Encounters:  10/14/23 202 lb (91.6 kg)  06/12/23 200 lb 6.4 oz (90.9 kg)  04/11/23 198 lb 3.2 oz (89.9 kg)   BP Readings from Last 3 Encounters:  11/20/23 (!) 164/96  10/14/23 134/82  06/12/23 122/82   Pulse Readings from Last 3 Encounters:  11/20/23 67  10/14/23 66  06/12/23 71    Renal function: CrCl cannot be calculated (Patient's most recent lab result is older than the maximum 21 days allowed.).  Past Medical History:  Diagnosis Date   Arthritis    BPH (benign prostatic hyperplasia)    CAD (coronary artery disease)    a. s/p prior MI; hx of stent to RCA and LAD;  b. LHC 12/2006:  LM 20%, pLAD 40%, mLAD stent ok, apical LAD 90% (1.5-53mm vessel), oD1 40-50%, AV groove CFX 30%, oOM1 30%, pRCA 30% (multiple), mRCA stent ok with 30-40% ISR, dRCA 30%, dAnt, inf-apical severe HK, EF 40% =>  med Rx.;  12/2013 Cath: LM nl, LAD 95ost, 46m ISR, 99apex, LCX nl, RCA 40p, 100 ISR, L->R collats, EF 35-40%-->pending CABG   Chronic back pain    lumbar compression fracture and stenosis   Clavicle fracture    Cryptogenic stroke [facility])    ED (erectile dysfunction)    GERD (gastroesophageal reflux disease)    occ   Hypertension    takes Losartan ,HCTZ,  and Metoprolol  daily   Ischemic cardiomyopathy    a. Echo 09/2012:  Mod LVH, focal basal hypertrophy, EF 30-35%, mid to dist inf-lat HK, Gr 1 diast dysfn, mild to mod LAE   Mixed hyperlipidemia    takes Atorvastatin  daily   Myocardial infarction [facility]) 2015   x2   Peripheral vascular disease (HCC)    Scaphoid non-union advanced collapse    Sleep apnea    not using cpap at present had new test and getting new cpap   Stroke (HCC) 03/2016   Urinary frequency    takes Flomax  daily   Vitamin B12 deficiency     Current Outpatient  Medications on File Prior to Visit  Medication Sig Dispense Refill   albuterol  (VENTOLIN  HFA) 108 (90 Base) MCG/ACT inhaler TAKE 2 PUFFS BY MOUTH EVERY 6 HOURS AS NEEDED FOR WHEEZE OR SHORTNESS OF BREATH 6.7 each 3   aspirin  EC 81 MG tablet Take 1 tablet by mouth daily.     atorvastatin  (LIPITOR) 80 MG tablet TAKE 1 TABLET BY MOUTH ONCE  DAILY 100 tablet 2   Baclofen  5 MG TABS Take 5 mg by mouth in the morning and at bedtime.     Bempedoic Acid -Ezetimibe  (NEXLIZET ) 180-10 MG TABS TAKE 1 TABLET BY MOUTH EVERY DAY 90 tablet 3   docusate sodium  (COLACE) 100 MG capsule Take 100 mg by mouth in the morning and at bedtime.     fenofibrate  (TRICOR ) 145 MG tablet TAKE 1 TABLET BY MOUTH DAILY 100 tablet 2   icosapent  Ethyl (VASCEPA ) 1 g capsule TAKE 2 CAPSULES BY MOUTH TWICE A DAY 360 capsule 3   metoprolol  succinate (TOPROL -XL) 25 MG 24 hr tablet TAKE ONE-HALF TABLET BY MOUTH  DAILY 45 tablet 3   omeprazole (PRILOSEC) 40 MG capsule Take 40 mg by mouth in the morning.     vitamin B-12 (CYANOCOBALAMIN) 500 MCG tablet Take 500 mcg by mouth in the morning.     nitroGLYCERIN  (NITROSTAT ) 0.4 MG SL tablet Place 1 tablet (0.4 mg total) under the tongue every 5 (five) minutes as needed for chest pain. 25 tablet 3   No current facility-administered medications on file prior to visit.    Allergies  Allergen Reactions   Hydrocodone Other (See Comments)    Throat itchy, weakness- took this during stroke not sure if caused problem  throat itchy, weakness    Blood pressure (!) 164/96, pulse 67.   Assessment/Plan:   1. Hypertension -  Essential hypertension, benign Assessment: Blood pressure in clinic is excessively elevated today Has not been checking at home recently Blood pressure at last few doctors visits just slightly above goal ~137/82  Drinks a little more caffeine than recommended Has not been as active lately with the colder weather  Plan: I have asked him to start checking his blood  pressure at home and call me with readings in 1 week If blood pressure is elevated at home as well will either increase losartan  or add an additional medication (would probably consider adding back hydrochlorothiazide ) Follow-up in clinic in 1 month Increase physical  activity      Thank you  [name], Pharm.D, BCACP, CPP [facility] Division of [facility] [address] [place], [place] [address]  Phone: [phone]; Fax: [phone]

## 2023-11-26 ENCOUNTER — Telehealth: Payer: Self-pay | Admitting: Pharmacist

## 2023-11-26 NOTE — Telephone Encounter (Signed)
162/98 98/70 174/101 119/79 166/102 116/73 168/100 98/67 178/104 134/77 160/96 180/101  Patient significant other called with patient's blood pressure readings.  He is taking losartan at 5AM and metoprolol at 5 PM.  The high readings are in the morning and the low readings are in the evening.  Evening readings are about 3 hours after his metoprolol.  Some morning readings at 5 AM but some repeated at 9 AM.  Possible that his losartan is wearing off.  Will try taking losartan 25 mg twice daily.  Appointment with me February 18.  The patient or significant other will call in 1 week with more readings.

## 2023-12-02 ENCOUNTER — Other Ambulatory Visit: Payer: Self-pay | Admitting: Cardiovascular Disease

## 2023-12-05 NOTE — Telephone Encounter (Signed)
AM 175/104 PM 97/67 AM 145/93 PM 116/73 AM 166/98 PM 128/72 AM 148/89 PM 110/68 AM 148/90 PM 105/69 AM 161/100 (cold medicine) PM 144/94 AM 168/96 (cold medicine) PM 170/103 (cold medicine)  Pt significant other called. AM readings are still high, but they are improved and his evening readings are not as low (previously systolic <100) Will continue with splitting losartan to 25mg  BID. Follow up in clinic at the end of Feb Advised against his cold medicine- dayquil/nyquil- probably has phenylephrine or pseudophed in it Advised saline rinse, zyrtec and flonase

## 2023-12-17 NOTE — Telephone Encounter (Signed)
Patient's significant other called. States his AM readings are still high. 150s-170's systolic but evening are low. Says he smokes about the same amount in the AM and PM. Will try taking losartan 50mg  all at once in the AM. He already has follow up with me in 2 weeks. If that doesn't work, may need to consider prn hydralazine.

## 2023-12-30 ENCOUNTER — Ambulatory Visit: Payer: Medicare Other | Attending: Internal Medicine | Admitting: Pharmacist

## 2023-12-30 VITALS — BP 140/80 | HR 76

## 2023-12-30 DIAGNOSIS — I10 Essential (primary) hypertension: Secondary | ICD-10-CM

## 2023-12-30 LAB — BASIC METABOLIC PANEL
BUN/Creatinine Ratio: 14 (ref 10–24)
BUN: 16 mg/dL (ref 8–27)
CO2: 28 mmol/L (ref 20–29)
Calcium: 10.2 mg/dL (ref 8.6–10.2)
Chloride: 96 mmol/L (ref 96–106)
Creatinine, Ser: 1.15 mg/dL (ref 0.76–1.27)
Glucose: 79 mg/dL (ref 70–99)
Potassium: 4.6 mmol/L (ref 3.5–5.2)
Sodium: 139 mmol/L (ref 134–144)
eGFR: 70 mL/min/{1.73_m2} (ref >59)

## 2023-12-30 NOTE — Assessment & Plan Note (Signed)
 Assessment: Blood pressure above goal of less than 130/80 today in clinic His evening blood pressures are much better controlled than morning Has not had BMP since increasing his losartan Generally much more active when the weather is nicer Has decreased coffee to 1 cup/day Not interested in quitting smoking We discussed changing losartan to either a stronger longer acting ARB or a different class.  They do have a large supply of losartan at home but are open to change if needed.  Plan: Check BMP today If BMP is stable we will add 25 mg of losartan in the evening along with his 50 mg in the morning Continue metoprolol succinate 12.5 mg daily in the evening If changing losartan is not successful to control blood pressure then we will consider changing to either irbesartan or amlodipine Will plan for follow-up via telephone in 2 weeks and in person in April

## 2023-12-30 NOTE — Patient Instructions (Signed)
 I will call you tomorrow with the results of your blood work and confirm that we can add 25mg  of losartan at night  Continue checking blood pressure twice a day

## 2023-12-30 NOTE — Progress Notes (Signed)
 Patient ID: [name]                 DOB: [birth year]                      MRN: [id number]      HPI: [name] is a 68 y.o. male referred by Dr. [name] to HTN clinic. PMH is significant for CAD status post CABG in 2015, CVA, hypertension, hyperlipidemia, CHF EF 40 to 45% in 2020, improved to 40 to 50% in 2022.  Tobacco use.  Seen by Dr. [name] in December 2024.  Blood pressure at that visit 134/82.  Previously had issues with low blood pressure.  Patient last seen in clinic 11/20/23. Blood pressure was elevated in clinic at 164/96. He was asked to check BP at home for 1 week and call me with results. I spoke with his significant other on 11/26/23. BP was elevated in the AM, but low in the PM. His losartan was changed to 25mg  BID. She followed up with me 1/30. AM readings were still high, but they were improved and his evening readings were not as low (previously systolic <100). No changes were made at that time. On 2/11, significant other called reporting AM readings still 150-170's but evening BP low. Losartan was changed to 50mg  in the AM.   Patient presents today accompanied by his significant other.  Several weeks ago they did purchase a new blood pressure cuff and brought it in today for comparison.  Patient denies any dizziness, lightheadedness, headache, blurred vision, SOB, swelling.  Waiting 30 minutes after coffee or cigarette before checking blood pressure.  Yesterday he was very active and blood pressure was very good.  She blood pressures in the morning still elevated.  In the evening they are much better controlled.  Takes losartan 50 mg at 5 AM and metoprolol succinate 12.5 mg daily at 5 PM.  Only drinking 1 cup of coffee per day now.  Home BP machine right: 137/82 HR 70 Clinic BP: 140/80 Home BP machine left arm: 152/91, 132/90, 130/80 140/84: clinic left    Current HTN meds: losartan 50 mg daily (AM), metoprolol succinate 12.5 mg daily (PM) Previously tried:  hydrochlorothiazide, ramipril BP goal: <130/80  Family History:  Family History  Problem Relation Age of Onset   Heart disease Father    Heart disease Maternal Grandmother    Heart disease Maternal Grandfather    Heart disease Paternal Grandmother    Heart disease Paternal Grandfather     Social History: + tobacco (not interested in quitting) no ETOH  Diet: rarely eats out, does not cook with salt but will add some 1 cup coffee daily   Exercise:  Tries to walk around the house and some chair exercises More active when the weather is warmer outside  Home BP readings:  AM   PM   142 85  120 68  124 73  120 69  180 105  147 88  159 99  138 88  160 97  127 74  166 101  131 78  149  93  133 77  170 98  101 66  159 101  127.125 76  156.5556 62.95284        Wt Readings from Last 3 Encounters:  10/14/23 202 lb (91.6 kg)  06/12/23 200 lb 6.4 oz (90.9 kg)  04/11/23 198 lb 3.2 oz (89.9 kg)   BP Readings from Last 3 Encounters:  12/30/23 [name])  140/80  11/20/23 (!) 164/96  10/14/23 134/82   Pulse Readings from Last 3 Encounters:  12/30/23 76  11/20/23 67  10/14/23 66    Renal function: CrCl cannot be calculated (Patient's most recent lab result is older than the maximum 21 days allowed.).  Past Medical History:  Diagnosis Date   Arthritis    BPH (benign prostatic hyperplasia)    CAD (coronary artery disease)    a. s/p prior MI; hx of stent to RCA and LAD;  b. LHC 12/2006:  LM 20%, pLAD 40%, mLAD stent ok, apical LAD 90% (1.5-22mm vessel), oD1 40-50%, AV groove CFX 30%, oOM1 30%, pRCA 30% (multiple), mRCA stent ok with 30-40% ISR, dRCA 30%, dAnt, inf-apical severe HK, EF 40% => med Rx.;  12/2013 Cath: LM nl, LAD 95ost, 59m ISR, 99apex, LCX nl, RCA 40p, 100 ISR, L->R collats, EF 35-40%-->pending CABG   Chronic back pain    lumbar compression fracture and stenosis   Clavicle fracture    Cryptogenic stroke [facility])    ED (erectile dysfunction)    GERD (gastroesophageal  reflux disease)    occ   Hypertension    takes Losartan,HCTZ,  and Metoprolol daily   Ischemic cardiomyopathy    a. Echo 09/2012:  Mod LVH, focal basal hypertrophy, EF 30-35%, mid to dist inf-lat HK, Gr 1 diast dysfn, mild to mod LAE   Mixed hyperlipidemia    takes Atorvastatin daily   Myocardial infarction [facility]) 2015   x2   Peripheral vascular disease (HCC)    Scaphoid non-union advanced collapse    Sleep apnea    not using cpap at present had new test and getting new cpap   Stroke (HCC) 03/2016   Urinary frequency    takes Flomax daily   Vitamin B12 deficiency     Current Outpatient Medications on File Prior to Visit  Medication Sig Dispense Refill   aspirin EC 81 MG tablet Take 1 tablet by mouth daily.     atorvastatin (LIPITOR) 80 MG tablet TAKE 1 TABLET BY MOUTH ONCE  DAILY 100 tablet 2   Baclofen 5 MG TABS Take 5 mg by mouth in the morning and at bedtime.     Bempedoic Acid-Ezetimibe (NEXLIZET) 180-10 MG TABS TAKE 1 TABLET BY MOUTH EVERY DAY 90 tablet 3   docusate sodium (COLACE) 100 MG capsule Take 100 mg by mouth in the morning and at bedtime.     fenofibrate (TRICOR) 145 MG tablet TAKE 1 TABLET BY MOUTH DAILY 100 tablet 2   furosemide (LASIX) 20 MG tablet TAKE 1 TABLET BY MOUTH DAILY 100 tablet 3   icosapent Ethyl (VASCEPA) 1 g capsule TAKE 2 CAPSULES BY MOUTH TWICE A DAY 360 capsule 3   losartan (COZAAR) 25 MG tablet Take 50 mg by mouth daily.     metoprolol succinate (TOPROL-XL) 25 MG 24 hr tablet TAKE ONE-HALF TABLET BY MOUTH  DAILY 45 tablet 3   omeprazole (PRILOSEC) 40 MG capsule Take 40 mg by mouth in the morning.     vitamin B-12 (CYANOCOBALAMIN) 500 MCG tablet Take 500 mcg by mouth in the morning.     albuterol (VENTOLIN HFA) 108 (90 Base) MCG/ACT inhaler TAKE 2 PUFFS BY MOUTH EVERY 6 HOURS AS NEEDED FOR WHEEZE OR SHORTNESS OF BREATH 6.7 each 3   nitroGLYCERIN (NITROSTAT) 0.4 MG SL tablet Place 1 tablet (0.4 mg total) under the tongue every 5 (five) minutes as  needed for chest pain. 25 tablet 3   No current facility-administered  medications on file prior to visit.    Allergies  Allergen Reactions   Hydrocodone Other (See Comments)    Throat itchy, weakness- took this during stroke not sure if caused problem  throat itchy, weakness    Blood pressure (!) 140/80, pulse 76.   Assessment/Plan:   1. Hypertension -  Essential hypertension, benign Assessment: Blood pressure above goal of less than 130/80 today in clinic His evening blood pressures are much better controlled than morning Has not had BMP since increasing his losartan Generally much more active when the weather is nicer Has decreased coffee to 1 cup/day Not interested in quitting smoking We discussed changing losartan to either a stronger longer acting ARB or a different class.  They do have a large supply of losartan at home but are open to change if needed.  Plan: Check BMP today If BMP is stable we will add 25 mg of losartan in the evening along with his 50 mg in the morning Continue metoprolol succinate 12.5 mg daily in the evening If changing losartan is not successful to control blood pressure then we will consider changing to either irbesartan or amlodipine Will plan for follow-up via telephone in 2 weeks and in person in April       Thank you  Olene Floss, Pharm.D, BCACP, CPP [facility] Division of [facility] [address] [place], [place] [address]  Phone: [phone]; Fax: [phone]

## 2023-12-31 ENCOUNTER — Telehealth: Payer: Self-pay | Admitting: Pharmacist

## 2023-12-31 NOTE — Telephone Encounter (Signed)
 BMP stable. Will add 25 mg of losartan in the PM. Total dose losartan 50mg  in the AM and 25mg  in the PM. Follow up already scheduled.

## 2024-01-14 ENCOUNTER — Telehealth: Payer: Self-pay | Admitting: Pharmacist

## 2024-01-14 MED ORDER — IRBESARTAN 300 MG PO TABS: 300.0000 mg | ORAL_TABLET | Freq: Every day | ORAL | 3 refills | Status: AC

## 2024-01-14 NOTE — Telephone Encounter (Signed)
 Pt c/o BP issue: STAT if pt c/o blurred vision, one-sided weakness or slurred speech  1. What are your last 5 BP readings?  178/108 - 03/11 9 A.M.  191/109 - 03/10 8:30 A.M.  189/109 - 03/09 8:00 A.M. 180/95   - 03/08 8:00 A.M.  180/103 - 03/07 8:00 A.M.   2. Are you having any other symptoms (ex. Dizziness, headache, blurred vision, passed out)? A little pain in eyes  3. What is your BP issue? Patient's wife is calling to state that patient BP reading are higher in the morning and goes down in the afternoon. Requesting call back to discuss care with Melissa, RPH-CPP.

## 2024-01-14 NOTE — Telephone Encounter (Signed)
 Spoke with [name] Am readings are very high PM readings still well controlled  123/75 123/78 120/65 107/70 138/78 123/70  Stop losartan and start irbesartan 300mg  daily Follow up in April already scheduled.

## 2024-01-15 ENCOUNTER — Other Ambulatory Visit: Payer: Self-pay

## 2024-01-15 ENCOUNTER — Emergency Department (HOSPITAL_BASED_OUTPATIENT_CLINIC_OR_DEPARTMENT_OTHER)
Admission: EM | Admit: 2024-01-15 | Discharge: 2024-01-15 | Disposition: A | Discharge status: Home / Self Care | Attending: Emergency Medicine | Admitting: Emergency Medicine

## 2024-01-15 ENCOUNTER — Encounter (HOSPITAL_BASED_OUTPATIENT_CLINIC_OR_DEPARTMENT_OTHER): Payer: Self-pay | Admitting: Emergency Medicine

## 2024-01-15 DIAGNOSIS — Z79899 Other long term (current) drug therapy: Secondary | ICD-10-CM | POA: Diagnosis not present

## 2024-01-15 DIAGNOSIS — I10 Essential (primary) hypertension: Secondary | ICD-10-CM | POA: Diagnosis present

## 2024-01-15 DIAGNOSIS — I2510 Atherosclerotic heart disease of native coronary artery without angina pectoris: Secondary | ICD-10-CM | POA: Diagnosis not present

## 2024-01-15 DIAGNOSIS — Z7982 Long term (current) use of aspirin: Secondary | ICD-10-CM | POA: Insufficient documentation

## 2024-01-15 DIAGNOSIS — Z8673 Personal history of transient ischemic attack (TIA), and cerebral infarction without residual deficits: Secondary | ICD-10-CM | POA: Diagnosis not present

## 2024-01-15 LAB — BASIC METABOLIC PANEL
Anion gap: 8 (ref 5–15)
BUN: 9 mg/dL (ref 8–23)
CO2: 28 mmol/L (ref 22–32)
Calcium: 9.7 mg/dL (ref 8.9–10.3)
Chloride: 101 mmol/L (ref 98–111)
Creatinine, Ser: 0.79 mg/dL (ref 0.61–1.24)
GFR, Estimated: >60 mL/min (ref >60)
Glucose, Bld: 90 mg/dL (ref 70–99)
Potassium: 3.7 mmol/L (ref 3.5–5.1)
Sodium: 137 mmol/L (ref 135–145)

## 2024-01-15 LAB — CBC WITH DIFFERENTIAL/PLATELET
Abs Immature Granulocytes: 0.02 10*3/uL (ref 0.00–0.07)
Basophils Absolute: 0.1 10*3/uL (ref 0.0–0.1)
Basophils Relative: 1 %
Eosinophils Absolute: 0.2 10*3/uL (ref 0.0–0.5)
Eosinophils Relative: 3 %
HCT: 47.1 % (ref 39.0–52.0)
Hemoglobin: 15.6 g/dL (ref 13.0–17.0)
Immature Granulocytes: 0 %
Lymphocytes Relative: 24 %
Lymphs Abs: 1.7 10*3/uL (ref 0.7–4.0)
MCH: 29.2 pg (ref 26.0–34.0)
MCHC: 33.1 g/dL (ref 30.0–36.0)
MCV: 88 fL (ref 80.0–100.0)
Monocytes Absolute: 0.6 10*3/uL (ref 0.1–1.0)
Monocytes Relative: 8 %
Neutro Abs: 4.4 10*3/uL (ref 1.7–7.7)
Neutrophils Relative %: 64 %
Platelets: 167 10*3/uL (ref 150–400)
RBC: 5.35 MIL/uL (ref 4.22–5.81)
RDW: 13.2 % (ref 11.5–15.5)
WBC: 7 10*3/uL (ref 4.0–10.5)
nRBC: 0 % (ref 0.0–0.2)

## 2024-01-15 NOTE — Discharge Instructions (Signed)
 You have been seen today for your complaint of high blood pressure. Your lab work was reassuring. Follow up with: Your primary care provider Please seek immediate medical care if you develop any of the following symptoms: Develop a severe headache or confusion. Have unusual weakness or numbness. Feel faint. Have severe pain in your chest or abdomen. Vomit repeatedly. Have trouble breathing. At this time there does not appear to be the presence of an emergent medical condition, however there is always the potential for conditions to change. Please read and follow the below instructions.  Do not take your medicine if  develop an itchy rash, swelling in your mouth or lips, or difficulty breathing; call 911 and seek immediate emergency medical attention if this occurs.  You may review your lab tests and imaging results in their entirety on your MyChart account.  Please discuss all results of fully with your primary care provider and other specialist at your follow-up visit.  Note: Portions of this text may have been transcribed using voice recognition software. Every effort was made to ensure accuracy; however, inadvertent computerized transcription errors may still be present.

## 2024-01-15 NOTE — Telephone Encounter (Signed)
 Spoke with Eunice Blase per DPR via STAT call. She has stated they have been working with Efraim Kaufmann the past 8 weeks to control pt's BP. Said that the pt's BP started to decrease after the most recent change and then started to creep back up on Sunday. Pt is taking medications at 5 am and 5 pm, daily. Yesterday's BP readings: 4am-189/109, 9am-179/108, 2pm-140/92, 9pm-107/65. Asked if he seems to be higher in the am and Eunice Blase stated yes that it doesn't start lower until around lunch time. Pt is asymptomatic and has only stated his eyes "felt a little weird" yesterday but that is not a problem today. We spoke about going to the ED to be seen while this is happening and also gave 911/ED precautions if any symptoms start. [name] stated understanding. Told her I would send over to [facility] and Dr [name] for their review.

## 2024-01-15 NOTE — Telephone Encounter (Signed)
 Tried to call patient back, unable to leave message due to voicemail being full.

## 2024-01-15 NOTE — Telephone Encounter (Signed)
 Left message for patient to call back. Looks like patient is in ED now.

## 2024-01-15 NOTE — Telephone Encounter (Signed)
 Pt c/o BP issue: STAT if pt c/o blurred vision, one-sided weakness or slurred speech.  STAT if BP is GREATER than 180/120 TODAY.  STAT if BP is LESS than 90/60 and SYMPTOMATIC TODAY  1. What is your BP concern? BP has been elevated for the last few weeks  2. Have you taken any BP medication today? Yes - Irbesartan 300 MG  3. What are your last 5 BP readings? 210/117  4. Are you having any other symptoms (ex. Dizziness, headache, blurred vision, passed out)? No   Call transferred due to BP being greater than 180/120 today

## 2024-01-15 NOTE — ED Provider Notes (Signed)
 [facility] [facility]ER[facility]ENC[facility] DE[facility]MENT AT [facility] Provider Note   CSN: [id number] Arrival date & time: 01/15/24  1023     History  Chief Complaint  Patient presents with   Hypertension    [name] is a 68 y.o. male.  With history of hyperlipidemia, CAD, hypertension, previous stroke on baby aspirin daily, peripheral vascular disease presenting to ED for evaluation of hypertension.  He has been struggling with hypertension for the past 8 weeks.  He has had numerous medication changes.  He was previously on losartan twice daily, last dose last night.  This morning he checked his blood pressure and found it to be 220/110.  He started a new antihypertensive today from the pharmacist.  At that time he had a pressure behind both eyes.  States this pressure has persisted, however it is not painful.  He denies vision changes.  No headaches.  No numbness, weakness or tingling.  No nausea or vomiting.  No chest pain or shortness of breath.  No abdominal pain.  He has been slowly decreasing his caffeine intake.  He denies other recent diet changes.  His blood pressure continuously improved throughout the day and is within normal range by the time he goes to bed.  He was switched to irbesartan today.   Hypertension       Home Medications Prior to Admission medications   Medication Sig Start Date End Date Taking? Authorizing Provider  albuterol (VENTOLIN HFA) 108 (90 Base) MCG/ACT inhaler TAKE 2 PUFFS BY MOUTH EVERY 6 HOURS AS NEEDED FOR WHEEZE OR SHORTNESS OF BREATH 11/13/23   Hunsucker, Lesia Sago, MD  aspirin EC 81 MG tablet Take 1 tablet by mouth daily. 03/18/16   [provider]  atorvastatin (LIPITOR) 80 MG tablet TAKE 1 TABLET BY MOUTH ONCE  DAILY 06/20/23   Wendall Stade, MD  Baclofen 5 MG TABS Take 5 mg by mouth in the morning and at bedtime.    [provider]  Bempedoic Acid-Ezetimibe (NEXLIZET) 180-10 MG TABS TAKE 1 TABLET BY MOUTH EVERY DAY 06/04/23    Wendall Stade, MD  docusate sodium (COLACE) 100 MG capsule Take 100 mg by mouth in the morning and at bedtime.    [provider]  fenofibrate (TRICOR) 145 MG tablet TAKE 1 TABLET BY MOUTH DAILY 06/20/23   Wendall Stade, MD  furosemide (LASIX) 20 MG tablet TAKE 1 TABLET BY MOUTH DAILY 12/03/23   Wendall Stade, MD  icosapent Ethyl (VASCEPA) 1 g capsule TAKE 2 CAPSULES BY MOUTH TWICE A DAY 06/04/23   Wendall Stade, MD  irbesartan (AVAPRO) 300 MG tablet Take 1 tablet (300 mg total) by mouth daily. 01/14/24   Wendall Stade, MD  metoprolol succinate (TOPROL-XL) 25 MG 24 hr tablet TAKE ONE-HALF TABLET BY MOUTH  DAILY 05/27/23   Wendall Stade, MD  nitroGLYCERIN (NITROSTAT) 0.4 MG SL tablet Place 1 tablet (0.4 mg total) under the tongue every 5 (five) minutes as needed for chest pain. 05/18/20 10/14/23  Wendall Stade, MD  omeprazole (PRILOSEC) 40 MG capsule Take 40 mg by mouth in the morning.    [provider]  vitamin B-12 (CYANOCOBALAMIN) 500 MCG tablet Take 500 mcg by mouth in the morning.    [provider]      Allergies    Hydrocodone    Review of Systems   Review of Systems  Constitutional:        Hypertension  All other systems reviewed  and are negative.   Physical Exam Updated Vital Signs BP (!) 147/106   Pulse (!) 58   Temp 98.7 F (37.1 C)   Resp 16   Wt 90.7 kg   SpO2 97%   BMI 30.41 kg/m  Physical Exam Vitals and nursing note reviewed.  Constitutional:      General: He is not in acute distress.    Appearance: Normal appearance. He is normal weight. He is not ill-appearing.     Comments: Resting comfortably in bed  HENT:     Head: Normocephalic and atraumatic.  Eyes:     Extraocular Movements: Extraocular movements intact.     Pupils: Pupils are equal, round, and reactive to light.     Comments: No nystagmus  Cardiovascular:     Rate and Rhythm: Normal rate and regular rhythm.  Pulmonary:     Effort: Pulmonary effort is normal. No  respiratory distress.  Abdominal:     General: Abdomen is flat.     Tenderness: There is no abdominal tenderness. There is no guarding.  Musculoskeletal:        General: Normal range of motion.     Cervical back: Neck supple.  Skin:    General: Skin is warm and dry.  Neurological:     Mental Status: He is alert and oriented to person, place, and time.     Comments: No focal neurologic deficits  Psychiatric:        Mood and Affect: Mood normal.        Behavior: Behavior normal.     ED Results / Procedures / Treatments   Labs (all labs ordered are listed, but only abnormal results are displayed) Labs Reviewed  BASIC METABOLIC PANEL  CBC WITH DIFFERENTIAL/PLATELET    EKG None  Radiology No results found.  Procedures Procedures    Medications Ordered in ED Medications - No data to display  ED Course/ Medical Decision Making/ A&P                                 Medical Decision Making Amount and/or Complexity of Data Reviewed Labs: ordered.  This patient presents to the ED for concern of hypertension, this involves an extensive number of treatment options, and is a complaint that carries with it a high risk of complications and morbidity. The differential diagnosis for hypertension includes but is not limited to hypertensive emergency, hypertensive urgency, stroke, sympathomimetic ingestion, acute pulmonary edema, ischemic stroke, intracranial hemorrhage, preeclampsia or eclampsia, autonomic dysreflexia, acute glomerulonephritis, type I MI, volume overload, urinary obstruction, pain, renal artery stenosis, polycystic kidney disease, Cushing syndrome, OSA, pheochromocytoma, hyperaldosteronism, hypothyroidism, anxiety   My initial workup includes basic labs  Additional history obtained from: Nursing notes from this visit. Nurse triage call cardiology.  Dr. [name] recommended against ED evaluation.  I ordered, reviewed and interpreted labs which include: BMP, CBC.  No  leukocytosis or anemia.  No electrolyte derangement or kidney dysfunction.  Hypertensive but otherwise hemodynamically stable.  68 year old male presenting to the ED for evaluation of hypertension.  He has been struggling with this for over a few months now.  He was just started on a new medication this morning.  He has a pressure behind both eyes but is otherwise asymptomatic.  He appears very well on physical exam.  He is concerned about his blood pressure and having another stroke.  Lab workup is reassuring.  No neurologic complaints.  No  evidence of endorgan dysfunction.  He was encouraged to follow-up with his primary care provider.  He was given return precautions.  Stable at discharge.  At this time there does not appear to be any evidence of an acute emergency medical condition and the patient appears stable for discharge with appropriate outpatient follow up. Diagnosis was discussed with patient who verbalizes understanding of care plan and is agreeable to discharge. I have discussed return precautions with patient and significant other at bedside who verbalizes understanding. Patient encouraged to follow-up with their PCP within 1 week. All questions answered.  Note: Portions of this report may have been transcribed using voice recognition software. Every effort was made to ensure accuracy; however, inadvertent computerized transcription errors may still be present.        Final Clinical Impression(s) / ED Diagnoses Final diagnoses:  Uncontrolled hypertension    Rx / DC Orders ED Discharge Orders     None         Mora Bellman 01/15/24 1259    Rondel Baton, MD 01/17/24 1220

## 2024-01-15 NOTE — ED Triage Notes (Signed)
 Pt endorses concern for HTN x 8 weeks, worsening over last 2 weeks.. Recent change in medication. Pt c/o HA, seeing spots, not currently seeing spots

## 2024-01-15 NOTE — Telephone Encounter (Signed)
 Wife Eunice Blase) returned RN's call.

## 2024-01-29 ENCOUNTER — Telehealth: Payer: Self-pay | Admitting: Pharmacist

## 2024-01-29 NOTE — Telephone Encounter (Signed)
 Pt c/o BP issue: STAT if pt c/o blurred vision, one-sided weakness or slurred speech.  STAT if BP is GREATER than 180/120 TODAY.  STAT if BP is LESS than 90/60 and SYMPTOMATIC TODAY  1. What is your BP concern? BP is high  2. Have you taken any BP medication today?yes  3. What are your last 5 BP readings?184/110, 178/102, 150/83, 157/97, 130/77  4. Are you having any other symptoms (ex. Dizziness, headache, blurred vision, passed out)? headache  Wife states his BP was good for about 5 days after medication change but now it is going back up again

## 2024-01-30 NOTE — Telephone Encounter (Signed)
 Patient returned pharmacist call and stated she had issues with her phone yesterday.

## 2024-01-31 MED ORDER — HYDRALAZINE HCL 25 MG PO TABS: 25.0000 mg | ORAL_TABLET | Freq: Three times a day (TID) | ORAL | 3 refills | Status: DC

## 2024-01-31 NOTE — Telephone Encounter (Signed)
 Spoke with [name].  Patient's blood pressure was improved for a few days after changing to irbesartan but is now back up again.  Significantly elevated 170s 180s in the morning and then progressively decreases in the evening.  Having some lows as low as systolic of 90s mostly low 100s in the evenings. Since he is very high in the morning and much lower in the evening I am a little nervous about using a longer acting medication like amlodipine.  Therefore we will go with a shorter acting medication such as hydralazine 25 mg 2-3 times a day.  Do not take in the evening if blood pressure is low.  Already has follow-up with me on April 9

## 2024-02-11 NOTE — Progress Notes (Unsigned)
 Patient ID: [name]                 DOB: Sep 03, 1956                      MRN: [id number]      HPI: [name] is a 68 y.o. male referred by Dr. [name] to HTN clinic. PMH is significant for CAD status post CABG in 2015, CVA, hypertension, hyperlipidemia, CHF EF 40 to 45% in 2020, improved to 40 to 50% in 2022.  Tobacco use.  Seen by Dr. [name] in December 2024.  Blood pressure at that visit 134/82.  Previously had issues with low blood pressure.   Last seen in clinic 12/30/23, patient brought in BP cuff and was validated. Patient denies any dizziness, lightheadedness, headache, blurred vision, SOB, swelling. BP in clinic 140/80. Waiting 30 minutes after coffee or cigarette before checking blood pressure. Yesterday he was very active and blood pressure was very good. She says blood pressures in the morning still elevated. In the evening they are much better controlled.  Takes losartan 50 mg at 5 AM and metoprolol succinate 12.5 mg daily at 5 PM. Only drinking 1 cup of coffee per day now. BMP stable, added an additional dose of losartan 25 mg at night.  01/14/24 Patient's significant other called with concerns for high BP (systolic 170s-low 190s) in the mornings. Experiencing persistent non-painful pressure behind his eyes. Losartan d/c'd and switched to irbesartan.   Patient went to ED on 3/12 with BP of 220/110. No vision changes or headaches. No evidence for hypertensive emergency, stable at discharge.  01/29/24 Per MyChart, patient continues to experience trend of elevated BP in AM and decreased to normal range in PM with some systolic lows in 40J-WJX 100s. Initiated on hydralazine 25 mg 2-3x daily (not to be taken in evening if BP is low).  Todays visit: Started hydralazine? helping? Home BP readings?  Any low readings?  Symptoms? Check bp in clinic  Current HTN meds: hydralazine 25 mg 2-3x daily, irbesartan 300 mg daily, metoprolol succinate 12.5 mg daily (PM) Previously tried:  hydrochlorothiazide, ramipril, losartan (switched to irbesartan) BP goal: <130/80 mmHg  Family History:  Heart disease (father, both grandmothers, both grandfathers)  Social History: Smoker, not interested in quitting at this time. No alcohol use.   Diet: Rarely eats out, does not cook with salt but will add some. 1 cup coffee daily  Exercise:  Tries to walk around the house and some chair exercises More active when the weather is warmer outside  Home BP readings:  Week of 3/7 AM readings: 178/108, 191/109, 189/109, 180/95, 180/103 Week of 3/11 PM readings: 123/75, 123/78, 120/65, 107/70, 138/78, 123/70 3/26: 184/110, 178/102, 150/83, 157/97, 130/77     Wt Readings from Last 3 Encounters:  01/15/24 200 lb (90.7 kg)  10/14/23 202 lb (91.6 kg)  06/12/23 200 lb 6.4 oz (90.9 kg)   BP Readings from Last 3 Encounters:  01/15/24 (!) 152/98  12/30/23 (!) 140/80  11/20/23 (!) 164/96   Pulse Readings from Last 3 Encounters:  01/15/24 63  12/30/23 76  11/20/23 67    Renal function: CrCl cannot be calculated (Patient's most recent lab result is older than the maximum 21 days allowed.).  Past Medical History:  Diagnosis Date   Arthritis    BPH (benign prostatic hyperplasia)    CAD (coronary artery disease)    a. s/p prior MI; hx of stent to RCA and  LAD;  b. LHC 12/2006:  LM 20%, pLAD 40%, mLAD stent ok, apical LAD 90% (1.5-37mm vessel), oD1 40-50%, AV groove CFX 30%, oOM1 30%, pRCA 30% (multiple), mRCA stent ok with 30-40% ISR, dRCA 30%, dAnt, inf-apical severe HK, EF 40% => med Rx.;  12/2013 Cath: LM nl, LAD 95ost, 32m ISR, 99apex, LCX nl, RCA 40p, 100 ISR, L->R collats, EF 35-40%-->pending CABG   Chronic back pain    lumbar compression fracture and stenosis   Clavicle fracture    Cryptogenic stroke [facility])    ED (erectile dysfunction)    GERD (gastroesophageal reflux disease)    occ   Hypertension    takes Losartan,HCTZ,  and Metoprolol daily   Ischemic cardiomyopathy    a.  Echo 09/2012:  Mod LVH, focal basal hypertrophy, EF 30-35%, mid to dist inf-lat HK, Gr 1 diast dysfn, mild to mod LAE   Mixed hyperlipidemia    takes Atorvastatin daily   Myocardial infarction [facility]) 2015   x2   Peripheral vascular disease (HCC)    Scaphoid non-union advanced collapse    Sleep apnea    not using cpap at present had new test and getting new cpap   Stroke (HCC) 03/2016   Urinary frequency    takes Flomax daily   Vitamin B12 deficiency     Current Outpatient Medications on File Prior to Visit  Medication Sig Dispense Refill   albuterol (VENTOLIN HFA) 108 (90 Base) MCG/ACT inhaler TAKE 2 PUFFS BY MOUTH EVERY 6 HOURS AS NEEDED FOR WHEEZE OR SHORTNESS OF BREATH 6.7 each 3   aspirin EC 81 MG tablet Take 1 tablet by mouth daily.     atorvastatin (LIPITOR) 80 MG tablet TAKE 1 TABLET BY MOUTH ONCE  DAILY 100 tablet 2   Baclofen 5 MG TABS Take 5 mg by mouth in the morning and at bedtime.     Bempedoic Acid-Ezetimibe (NEXLIZET) 180-10 MG TABS TAKE 1 TABLET BY MOUTH EVERY DAY 90 tablet 3   docusate sodium (COLACE) 100 MG capsule Take 100 mg by mouth in the morning and at bedtime.     fenofibrate (TRICOR) 145 MG tablet TAKE 1 TABLET BY MOUTH DAILY 100 tablet 2   furosemide (LASIX) 20 MG tablet TAKE 1 TABLET BY MOUTH DAILY 100 tablet 3   hydrALAZINE (APRESOLINE) 25 MG tablet Take 1 tablet (25 mg total) by mouth 3 (three) times daily. 120 tablet 3   icosapent Ethyl (VASCEPA) 1 g capsule TAKE 2 CAPSULES BY MOUTH TWICE A DAY 360 capsule 3   irbesartan (AVAPRO) 300 MG tablet Take 1 tablet (300 mg total) by mouth daily. 90 tablet 3   metoprolol succinate (TOPROL-XL) 25 MG 24 hr tablet TAKE ONE-HALF TABLET BY MOUTH  DAILY 45 tablet 3   nitroGLYCERIN (NITROSTAT) 0.4 MG SL tablet Place 1 tablet (0.4 mg total) under the tongue every 5 (five) minutes as needed for chest pain. 25 tablet 3   omeprazole (PRILOSEC) 40 MG capsule Take 40 mg by mouth in the morning.     vitamin B-12 (CYANOCOBALAMIN)  500 MCG tablet Take 500 mcg by mouth in the morning.     No current facility-administered medications on file prior to visit.    Allergies  Allergen Reactions   Hydrocodone Other (See Comments)    Throat itchy, weakness- took this during stroke not sure if caused problem  throat itchy, weakness    There were no vitals taken for this visit.   Assessment/Plan: No BP recorded.  {Refresh Note  OR Click here to enter BP  :1}***   1. Hypertension -  No problem-specific Assessment & Plan notes found for this encounter.      Thank you,  Lendon Ka, PharmD Candidate 2025 APPE [facility] Extern 02/12/24  Olene Floss, Pharm.D, BCACP, CPP [facility] of [facility] [address], [place], [place] [address]  Phone: [phone]; Fax: [phone]

## 2024-02-12 ENCOUNTER — Ambulatory Visit: Payer: Medicare Other | Attending: Cardiology | Admitting: Pharmacist

## 2024-02-12 ENCOUNTER — Encounter: Payer: Self-pay | Admitting: Pharmacist

## 2024-02-12 VITALS — BP 180/100 | HR 70

## 2024-02-12 DIAGNOSIS — I10 Essential (primary) hypertension: Secondary | ICD-10-CM

## 2024-02-12 NOTE — Assessment & Plan Note (Addendum)
 Assessment: Patient's blood pressure continues to follow trend of elevated morning BPs (150s-170s/90s-100s) not at goal, with evening BPs (100s-120s/60s-70s) at goal of < 130/80 mmHg.  Addition of irbesartan and hydralazine does appear to be helping with previous weeks' morning SBP in 170s-190s.  Patient reports symptoms of pressure behind the eyes (non painful) when BP is elevated and "seeing stars" when very low in SBP 80s-90s. Patient has been consistently taking hydralazine 25 mg when first waking up (4-5 am) and an additional dose around 10-11am. Vitals today: BP 176/96, 180/100 mmHg, HR 70.  Plan: Start taking irbesartan 300 mg at night for additional overnight BP coverage.  Start taking 2 tablets of hydralazine 25 mg (50 mg total) in the AM. May take an additional hydralazine 25 mg if SBP > 160 2-6 hours after morning dose.  Coordinate ambulatory BP monitoring visit to determine comprehensive 24-hour trends and overnight BP. Depending on overnight trend, may need additional dose of hydralazine 25 mg in late evening (after 9 pm) to mitigate nocturnal HTN. Advised patient to continue to record timing of hydralazine and check daily blood pressures at home and report back with readings.

## 2024-02-12 NOTE — Patient Instructions (Addendum)
 START taking 2 tablets of hydralazine 25 mg tablets (50 mg total) as soon as you wake up. Take an additional hydralazine 25 mg if systolic BP (top number) > 160 after 3-6 hours from morning dose. Start taking irbesartan 300 mg at night.   You will be contacted by the clinic to set up an ambulatory blood pressure monitoring visit. This is a BP cuff device worn for 24 hours with intermittent BP checking (every 20 minutes in the daytime, once an hour at night).   Your blood pressure goal is < 130/25mmHg   Important lifestyle changes to control high blood pressure  Intervention  Effect on the BP   Weight loss Weight loss is one of the most effective lifestyle changes for controlling blood pressure. If you're overweight or obese, losing even a small amount of weight can help reduce blood pressure.    Blood pressure can decrease by 1 millimeter of mercury (mmHg) with each kilogram (about 2.2 pounds) of weight lost.   Exercise regularly As a general goal, aim for 30 minutes of moderate physical activity every day.    Regular physical activity can lower blood pressure by 5 - 8 mmHg.   Eat a healthy diet Eat a diet rich in whole grains, fruits, vegetables, lean meat, and low-fat dairy products. Limit processed foods, saturated fat, and sweets.    A heart-healthy diet can lower high blood pressure by 10 mmHg.   Reduce salt (sodium) in your diet Aim for 000mg  of sodium each day. Avoid deli meats, canned food, and frozen microwave meals which are high in sodium.     Limiting sodium can reduce blood pressure by 5 mmHg.   Limit alcohol One drink equals 12 ounces of beer, 5 ounces of wine, or 1.5 ounces of 80-proof liquor.    Limiting alcohol to < 1 drink a day for women or < 2 drinks a day for men can help lower blood pressure by about 4 mmHg.   To check your pressure at home you will need to:   Sit up in a chair, with feet flat on the floor and back supported. Do not cross your  ankles or legs. Rest your left arm so that the cuff is about heart level. If the cuff goes on your upper arm, then just relax your arm on the table, arm of the chair, or your lap. If you have a wrist cuff, hold your wrist against your chest at heart level. Place the cuff snugly around your arm, about 1 inch above the crease of your elbow. The cords should be inside the groove of your elbow.  Sit quietly, with the cuff in place, for about 5 minutes. Then press the power button to start a reading. Do not talk or move while the reading is taking place.  Record your readings on a sheet of paper. Although most cuffs have a memory, it is often easier to see a pattern developing when the numbers are all in front of you.  You can repeat the reading after 1-3 minutes if it is recommended.   Make sure your bladder is empty and you have not had caffeine or tobacco within the last 30 minutes   Always bring your blood pressure log with you to your appointments. If you have not brought your monitor in to be double checked for accuracy, please bring it to your next appointment.   You can find a list of validated (accurate) blood pressure cuffs at: validatebp.org

## 2024-02-28 ENCOUNTER — Other Ambulatory Visit: Payer: Self-pay | Admitting: Pulmonary Disease

## 2024-03-03 ENCOUNTER — Other Ambulatory Visit: Payer: Self-pay | Admitting: Cardiovascular Disease

## 2024-04-17 ENCOUNTER — Other Ambulatory Visit: Payer: Self-pay

## 2024-04-17 MED ORDER — NEXLIZET 180-10 MG PO TABS: 1.0000 | ORAL_TABLET | Freq: Every day | ORAL | 1 refills | Status: DC

## 2024-04-17 MED ORDER — ICOSAPENT ETHYL 1 G PO CAPS: 2.0000 g | ORAL_CAPSULE | Freq: Two times a day (BID) | ORAL | 1 refills | Status: DC

## 2024-06-06 ENCOUNTER — Other Ambulatory Visit: Payer: Self-pay | Admitting: Cardiovascular Disease

## 2024-06-28 ENCOUNTER — Other Ambulatory Visit: Payer: Self-pay | Admitting: Pulmonary Disease

## 2024-07-09 ENCOUNTER — Other Ambulatory Visit: Payer: Self-pay | Admitting: Cardiovascular Disease

## 2024-08-11 ENCOUNTER — Other Ambulatory Visit: Payer: Self-pay | Admitting: Pulmonary Disease

## 2024-08-16 ENCOUNTER — Other Ambulatory Visit: Payer: Self-pay | Admitting: Cardiovascular Disease

## 2024-08-17 ENCOUNTER — Telehealth: Payer: Self-pay | Admitting: Pharmacist

## 2024-08-17 NOTE — Telephone Encounter (Signed)
 Called and spoke with patient to follow-up on his blood pressure.  24-hour blood pressure monitor was never completed.  Patient reports that sometimes his blood pressure is high in the mornings 160s but then drops low by the afternoon evenings.  Other times his blood pressure is in the 130s over 80s in the morning and then drops even lower later in the day.  This has been pretty consistent for patient for a while.  Denies any low blood pressures that make him feel poorly.  Continue current medications and reach out if needed.

## 2024-08-27 ENCOUNTER — Other Ambulatory Visit: Payer: Self-pay | Admitting: Family Medicine

## 2024-08-27 DIAGNOSIS — F17208 Nicotine dependence, unspecified, with other nicotine-induced disorders: Secondary | ICD-10-CM

## 2024-08-27 LAB — LAB REPORT - SCANNED: EGFR: 85

## 2024-08-31 ENCOUNTER — Other Ambulatory Visit: Payer: Self-pay | Admitting: Pulmonary Disease

## 2024-08-31 ENCOUNTER — Ambulatory Visit
Admission: RE | Admit: 2024-08-31 | Discharge: 2024-08-31 | Disposition: A | Source: Ambulatory Visit | Attending: Family Medicine | Admitting: Family Medicine

## 2024-08-31 ENCOUNTER — Telehealth: Payer: Self-pay

## 2024-08-31 DIAGNOSIS — F17208 Nicotine dependence, unspecified, with other nicotine-induced disorders: Secondary | ICD-10-CM

## 2024-08-31 NOTE — Telephone Encounter (Signed)
 Sorry Isaiah!!  Routing mistake, sending to Dr. Annella!

## 2024-08-31 NOTE — Telephone Encounter (Signed)
 Copied from CRM #[id number]. Topic: Clinical - Request for Lab/Test Order >> Aug 31, 2024 11:57 AM Devaughn RAMAN wrote: Reason for CRM: Patient wife Adrien called to inform Dr.Hunsucker patient had a Lung Cancer Screening done on today and the records are in the system. Adrien stated pt pcp sent him to get the testing done on this morning and she wanted to inform Dr.Hunsucker.  Routing to [facility].

## 2024-09-21 ENCOUNTER — Other Ambulatory Visit: Payer: Self-pay | Admitting: Pulmonary Disease

## 2024-09-21 ENCOUNTER — Telehealth: Payer: Self-pay | Admitting: Cardiovascular Disease

## 2024-09-21 ENCOUNTER — Telehealth: Payer: Self-pay | Admitting: Pharmacy Technician

## 2024-09-21 MED ORDER — ALBUTEROL SULFATE HFA 108 (90 BASE) MCG/ACT IN AERS: 2.0000 | INHALATION_SPRAY | Freq: Four times a day (QID) | RESPIRATORY_TRACT | 0 refills | Status: DC | PRN

## 2024-09-21 NOTE — Telephone Encounter (Signed)
 Pt c/o medication issue:  1. Name of Medication:  Bempedoic Acid -Ezetimibe  (NEXLIZET ) 180-10 MG TABS  icosapent  Ethyl (VASCEPA ) 1 g capsule  2. How are you currently taking this medication (dosage and times per day)?   3. Are you having a reaction (difficulty breathing--STAT)?   4. What is your medication issue?   Spouse says patient would like assistance with re-enrolling in grant for Nexlizet  and Vascepa . Please advise.

## 2024-09-21 NOTE — Telephone Encounter (Signed)
 Patient Advocate Encounter   The patient was approved for a [employer] grant that will help cover the cost of nexlizet /vascepa  Total amount awarded, 2500.  Effective: 08/22/24 - 08/21/25   APW:[id number] ERW:[id number] Hmnle:[id number] PI:[id number] [employer] ID: [id number]   Pharmacy provided with approval and processing information. Patient informed via telephone    Given to [facility]

## 2024-09-21 NOTE — Telephone Encounter (Signed)
   Patient Advocate Encounter   The patient was approved for a [employer] grant that will help cover the cost of nexlizet /vascepa  Total amount awarded, 2500.  Effective: 08/22/24 - 08/21/25   APW:[id number] ERW:[id number] Hmnle:[id number] PI:[id number] [employer] ID: [id number]   Pharmacy provided with approval and processing information. Patient informed via telephone   Given to [facility]

## 2024-09-21 NOTE — Telephone Encounter (Signed)
 Copied from CRM #[id number]. Topic: Clinical - Medication Refill >> Sep 21, 2024  9:56 AM Abigail D wrote: Medication: albuterol  (VENTOLIN  HFA) 108 (90 Base) MCG/ACT inhaler  Has the patient contacted their pharmacy? Yes - pharmacy advised he needs appt for future refills, appt scheduled Jan. 5th. (Agent: If no, request that the patient contact the pharmacy for the refill. If patient does not wish to contact the pharmacy document the reason why and proceed with request.) (Agent: If yes, when and what did the pharmacy advise?)  This is the patient's preferred pharmacy:  [id number] - [place],  - [address] [place] [address] Phone: [phone] Fax: [phone]   Is this the correct pharmacy for this prescription? Yes If no, delete pharmacy and type the correct one.   Has the prescription been filled recently? No  Is the patient out of the medication? No, on last one  Has the patient been seen for an appointment in the last year OR does the patient have an upcoming appointment? Yes  Can we respond through MyChart? No  Agent: Please be advised that Rx refills may take up to 3 business days. We ask that you follow-up with your pharmacy.

## 2024-10-09 ENCOUNTER — Other Ambulatory Visit: Payer: Self-pay | Admitting: Cardiovascular Disease

## 2024-10-14 ENCOUNTER — Other Ambulatory Visit: Payer: Self-pay | Admitting: Pulmonary Disease

## 2024-10-21 ENCOUNTER — Other Ambulatory Visit: Payer: Self-pay | Admitting: Cardiovascular Disease

## 2024-11-05 ENCOUNTER — Other Ambulatory Visit: Payer: Self-pay | Admitting: Pulmonary Disease

## 2024-11-09 ENCOUNTER — Ambulatory Visit: Admitting: Pulmonary Disease

## 2024-11-09 ENCOUNTER — Encounter: Payer: Self-pay | Admitting: Pulmonary Disease

## 2024-11-09 VITALS — BP 153/90 | HR 66 | Ht 68.0 in | Wt 204.4 lb

## 2024-11-09 DIAGNOSIS — J439 Emphysema, unspecified: Secondary | ICD-10-CM

## 2024-11-09 DIAGNOSIS — F17210 Nicotine dependence, cigarettes, uncomplicated: Secondary | ICD-10-CM

## 2024-11-09 DIAGNOSIS — Z716 Tobacco abuse counseling: Secondary | ICD-10-CM

## 2024-11-09 DIAGNOSIS — J432 Centrilobular emphysema: Secondary | ICD-10-CM | POA: Insufficient documentation

## 2024-11-09 NOTE — Progress Notes (Signed)
 "  @Patient  ID: [name], male    DOB: 08-29-1956, 69 y.o.   MRN: [id number]  Chief Complaint  Patient presents with   Medical Management of Chronic Issues    Pt states he's been doping so much better no concerns     Referring provider: Lynwood Laneta LELON DEVONNA  HPI:   69 y.o. man whom we are seeing for evaluation of emphysema and tobacco abuse.  Most recent PCP note x 2 reviewed.  Patient returns for follow-up.  Last seen 06/2023.  Follow-up as needed.  Since then, doing well.  No real issues.  No worsening dyspnea.  Rare, infrequent rescue inhaler, albuterol  use.  In interim had yearly CT scan x 2, lung RADS 1 and 08/25/2023 and 08/24/2024.  Encouraged ongoing yearly follow-up given active smoking.  And pack-year.  Discussed, given no symptoms no need to escalate medications.  HPI initial visit: Overall he feels well.  He denies any significant dyspnea exertion.  I pushed on this and states that he gets a little short of breath with inclines and long walks.  Most issue with inclines and stairs is more related to musculoskeletal pain and dyspnea.  He denies significant dyspnea exertion.  He works in the yard regularly and denies significant symptoms.  He continues to smoke.  About a pack a day.  Discussed strategies for tobacco cessation.  Reviewed his CT scan most recently 06/2022.  Mild emphysematous changes, otherwise clear lung.  No nodules noted.  Reviewed most recent TTE 2022 there is a EF of 45%, cardiomyopathy.  Reviewed recent PFT 04/2023 in detail.  Discussed at length with patient and wife today.    Questionaires / Pulmonary Flowsheets:   ACT:      No data to display          MMRC:     No data to display          Epworth:      No data to display          Tests:   FENO:  No results found for: NITRICOXIDE  PFT:    Latest Ref Rng & Units 04/29/2023    9:50 AM 12/30/2013    8:38 AM  PFT Results  FVC-Pre L 3.03  4.05   FVC-Predicted Pre % 72   89   FVC-Post L 2.63  4.10   FVC-Predicted Post % 62  91   Pre FEV1/FVC % % 67  73   Post FEV1/FCV % % 59  72   FEV1-Pre L 2.02  2.96   FEV1-Predicted Pre % 64  86   FEV1-Post L 1.56  2.95   DLCO uncorrected ml/min/mmHg 17.07  20.02   DLCO UNC% % 68  67   DLCO corrected ml/min/mmHg  20.02   DLCO COR %Predicted %  67   DLVA Predicted % 94  79   TLC L 5.88  6.61   TLC % Predicted % 88  100   RV % Predicted % 131  123   Personally reviewed and interpreted as spirometry just of air trapping versus restriction, no fixed obstruction, no bronchodilator response, lung volumes consistent with air trapping, no hyperinflation, DLCO moderately reduced  WALK:      No data to display          Imaging: Personally reviewed and as per EMR and discussion in this note No results found.  Lab Results: Personally reviewed CBC    Component Value Date/Time   WBC 7.0  01/15/2024 1210   RBC 5.35 01/15/2024 1210   HGB 15.6 01/15/2024 1210   HGB 14.6 04/29/2020 0749   HCT 47.1 01/15/2024 1210   HCT 47.2 04/29/2020 0749   PLT 167 01/15/2024 1210   PLT 195 04/29/2020 0749   MCV 88.0 01/15/2024 1210   MCV 90 04/29/2020 0749   MCH 29.2 01/15/2024 1210   MCHC 33.1 01/15/2024 1210   RDW 13.2 01/15/2024 1210   RDW 12.3 04/29/2020 0749   LYMPHSABS 1.7 01/15/2024 1210   LYMPHSABS 2.3 04/29/2020 0749   MONOABS 0.6 01/15/2024 1210   EOSABS 0.2 01/15/2024 1210   EOSABS 0.6 (H) 04/29/2020 0749   BASOSABS 0.1 01/15/2024 1210   BASOSABS 0.1 04/29/2020 0749    BMET    Component Value Date/Time   NA 137 01/15/2024 1210   NA 139 12/30/2023 0939   K 3.7 01/15/2024 1210   CL 101 01/15/2024 1210   CO2 28 01/15/2024 1210   GLUCOSE 90 01/15/2024 1210   BUN 9 01/15/2024 1210   BUN 16 12/30/2023 0939   CREATININE 0.79 01/15/2024 1210   CALCIUM  9.7 01/15/2024 1210   GFRNONAA >60 01/15/2024 1210   GFRAA 83 08/31/2020 0756    BNP No results found for: BNP  ProBNP    Component Value Date/Time    PROBNP 118 03/08/2017 1213    Specialty Problems       Pulmonary Problems   DYSPNEA   Qualifier: Diagnosis of  By: Elise Chess        Sleep apnea    Allergies  Allergen Reactions   Hydrocodone Other (See Comments)    Throat itchy, weakness- took this during stroke not sure if caused problem  throat itchy, weakness    Immunization History  Administered Date(s) Administered   Moderna Sars-Covid-2 Vaccination 01/22/2020, 02/23/2020    Past Medical History:  Diagnosis Date   Arthritis    BPH (benign prostatic hyperplasia)    CAD (coronary artery disease)    a. s/p prior MI; hx of stent to RCA and LAD;  b. LHC 12/2006:  LM 20%, pLAD 40%, mLAD stent ok, apical LAD 90% (1.5-16mm vessel), oD1 40-50%, AV groove CFX 30%, oOM1 30%, pRCA 30% (multiple), mRCA stent ok with 30-40% ISR, dRCA 30%, dAnt, inf-apical severe HK, EF 40% => med Rx.;  12/2013 Cath: LM nl, LAD 95ost, 52m ISR, 99apex, LCX nl, RCA 40p, 100 ISR, L->R collats, EF 35-40%-->pending CABG   Chronic back pain    lumbar compression fracture and stenosis   Clavicle fracture    Cryptogenic stroke [facility])    ED (erectile dysfunction)    GERD (gastroesophageal reflux disease)    occ   Hypertension    takes Losartan ,HCTZ,  and Metoprolol  daily   Ischemic cardiomyopathy    a. Echo 09/2012:  Mod LVH, focal basal hypertrophy, EF 30-35%, mid to dist inf-lat HK, Gr 1 diast dysfn, mild to mod LAE   Mixed hyperlipidemia    takes Atorvastatin  daily   Myocardial infarction [facility]) 2015   x2   Peripheral vascular disease    Scaphoid non-union advanced collapse    Sleep apnea    not using cpap at present had new test and getting new cpap   Stroke (HCC) 03/2016   Urinary frequency    takes Flomax  daily   Vitamin B12 deficiency     Tobacco History: Social History   Tobacco Use  Smoking Status Every Day   Current packs/day: 1.00   Average packs/day: 1 pack/day for  20.0 years (20.0 ttl pk-yrs)   Types: Cigarettes   Smokeless Tobacco Never   Ready to quit: Not Answered Counseling given: Not Answered    Outpatient Encounter Medications as of 11/09/2024  Medication Sig   albuterol  (VENTOLIN  HFA) 108 (90 Base) MCG/ACT inhaler TAKE 2 PUFFS BY MOUTH EVERY 6 HOURS AS NEEDED FOR WHEEZE OR SHORTNESS OF BREATH   aspirin  EC 81 MG tablet Take 1 tablet by mouth daily.   atorvastatin  (LIPITOR) 80 MG tablet TAKE 1 TABLET BY MOUTH ONCE  DAILY   Baclofen  5 MG TABS Take 5 mg by mouth in the morning and at bedtime.   Bempedoic Acid -Ezetimibe  (NEXLIZET ) 180-10 MG TABS Take 1 tablet by mouth daily.   docusate sodium  (COLACE) 100 MG capsule Take 100 mg by mouth in the morning and at bedtime.   fenofibrate  (TRICOR ) 145 MG tablet Take 1 tablet (145 mg total) by mouth daily. Need to schedule appt with provider by December for additional refills.   furosemide  (LASIX ) 20 MG tablet TAKE 1 TABLET BY MOUTH DAILY   hydrALAZINE  (APRESOLINE ) 25 MG tablet Take 1 tablet (25 mg total) by mouth 3 (three) times daily. Pt must keep upcoming followup appt with Cardiology in January 2026 for any more refills. Thank You   icosapent  Ethyl (VASCEPA ) 1 g capsule Take 2 capsules (2 g total) by mouth 2 (two) times daily.   irbesartan  (AVAPRO ) 300 MG tablet Take 1 tablet (300 mg total) by mouth daily.   metoprolol  succinate (TOPROL -XL) 25 MG 24 hr tablet TAKE ONE-HALF TABLET BY MOUTH  DAILY   nitroGLYCERIN  (NITROSTAT ) 0.4 MG SL tablet Place 1 tablet (0.4 mg total) under the tongue every 5 (five) minutes as needed for chest pain.   omeprazole (PRILOSEC) 40 MG capsule Take 40 mg by mouth in the morning.   vitamin B-12 (CYANOCOBALAMIN) 500 MCG tablet Take 500 mcg by mouth in the morning.   No facility-administered encounter medications on file as of 11/09/2024.     Review of Systems  Review of Systems  N/a Physical Exam  BP (!) 153/90   Pulse 66   Ht 5' 8 (1.727 m) Comment: per pt  Wt 204 lb 6.4 oz (92.7 kg)   SpO2 98%   BMI 31.08 kg/m    Wt Readings from Last 5 Encounters:  11/09/24 204 lb 6.4 oz (92.7 kg)  01/15/24 200 lb (90.7 kg)  10/14/23 202 lb (91.6 kg)  06/12/23 200 lb 6.4 oz (90.9 kg)  04/11/23 198 lb 3.2 oz (89.9 kg)    BMI Readings from Last 5 Encounters:  11/09/24 31.08 kg/m  01/15/24 30.41 kg/m  10/14/23 30.71 kg/m  06/12/23 30.47 kg/m  04/11/23 30.14 kg/m     Physical Exam General: Sitting up, no distress Eyes: No icterus Neck: No JVP Pulmonary: Scattered rhonchi, normal work of breathing, on room air Cardiovascular: Warm, no edema Abdomen: Not distended   Assessment & Plan:   Emphysema: as a result of cigarette smoking. PFTs with air trapping, no fixed obstruction. Suspect would benefit from bronchodilators. However, he has minimal subjective symptoms.  Rare rescue inhaler use.  Therefore, no new medications today.  Discussed this could change in the future and the need for inhalers, medications in the future.  Moderately reduced DLCO: Suspect related to emphysema seen on CT scan.  Tobacco abuse: Continue lung cancer screening, currently managed by PCP.  Can continue yearly screening next due 08/2025 via PCP office.  Smoking assessment and cessation counseling  Patient currently smoking:  I have advised the patient to quit/stop smoking as soon as possible due to high risk for multiple medical problems.  It will also be very difficult for us  to manage patient's  respiratory symptoms and status if we continue to expose her lungs to a known irritant.  Patient is not willing to quit smoking. I have advised the patient that we can assist and have options of nicotine replacement therapy, provided smoking cessation education today, provided smoking cessation counseling, and provided cessation resources. Follow-up next office visit here and at PCP office for assessment of smoking cessation.  I spent 3 minutes in smoking cessation counseling.   Return in about 1 year (around 11/09/2025) for f/u Dr.  Annella.   Donnice JONELLE Annella, MD 11/09/2024   "

## 2024-11-16 NOTE — Progress Notes (Signed)
 Patient ID: [name], male   DOB: 09-11-1956, 69 y.o.   MRN: [id number]    68 y.o. f/u for lipids and CAD. Previous BMS to RCA and LAD in 2002/2008. Failed and subsequent CABG  CABG 01/01/14  with Dr [name] This included LIMA to LAD and SVG to PDA EF 30-35% by echo   Has not smoked since surgery EF 35-40% by last TEE done 03/15/16 Seen by [name] and CRT/AICD not thought To be needed  TTE 04/21/19 EF 40-45% Discussed Entresto  And he seems to be doing fine on his current regimen Meds titratedDown by pharm D due to low BP  Cardiac MRI 03/02/21 EF 39%  May of 2017 had CVA left PICA cerebellum dissection left vertebral. Now off plavix  per neurology   Had ILR placed by Dr [name] June 2017 No PAF detected  Has had edema on norvasc  and back on lasix . 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 Wife also smokes Has a recumbent bike and needs to use it   ROS: Denies fever, malais, weight loss, blurry vision, decreased visual acuity, cough, sputum, SOB, hemoptysis, pleuritic pain, palpitaitons, heartburn, abdominal pain, melena, lower extremity edema, claudication, or rash.  All other systems reviewed and negative  General: BP (!) 144/84   Pulse 63   Ht 5' 8 (1.727 m)   Wt 92.5 kg   SpO2 97%   BMI 31.02 kg/m   Affect appropriate Healthy:  appears stated age HEENT: normal Neck supple with  no adenopathy JVP normal no bruits no thyromegaly Lungs clear with no wheezing and good diaphragmatic motion Heart:  S1/S2 no murmur, no rub, gallop or click PMI normal Abdomen: benighn, BS positve, no tenderness, no AAA no bruit.  No HSM or HJR Distal pulses intact with no bruits Plus 2  bilateral edema Neuro non-focal poor balance  Skin warm and dry Poor balance walking with cane     Current Outpatient Medications  Medication Sig Dispense Refill   albuterol  (VENTOLIN  HFA) 108 (90 Base) MCG/ACT inhaler TAKE 2 PUFFS BY MOUTH EVERY 6 HOURS AS NEEDED FOR WHEEZE OR SHORTNESS OF BREATH 18 each 0   aspirin  EC 81 MG tablet Take 1 tablet by mouth daily.     atorvastatin  (LIPITOR) 80 MG tablet TAKE 1 TABLET BY MOUTH ONCE  DAILY 90 tablet 0   Baclofen  5 MG TABS Take 5 mg by mouth in the morning and at bedtime.     Bempedoic Acid -Ezetimibe  (NEXLIZET ) 180-10 MG TABS Take 1 tablet by mouth daily. 90 tablet 1   docusate sodium  (COLACE) 100 MG capsule Take 100 mg by mouth in the morning and at bedtime.     fenofibrate  (TRICOR ) 145 MG tablet Take 1 tablet (145 mg total) by mouth daily. Need to schedule appt with provider by December  for additional refills. 100 tablet 1   furosemide  (LASIX ) 20 MG tablet TAKE 1 TABLET BY MOUTH DAILY 15 tablet 0   hydrALAZINE  (APRESOLINE ) 25 MG tablet Take 1 tablet (25 mg total) by mouth 3 (three) times daily. Pt must keep upcoming followup appt with Cardiology in January 2026 for any more refills. Thank You (Patient taking differently: Take 25 mg by mouth 3 (three) times daily. Takes 2 in the morning and additional tablets if needed) 270 tablet 0   icosapent  Ethyl (VASCEPA ) 1 g capsule Take 2 capsules (2 g total) by mouth 2 (two) times daily. 360 capsule 1   irbesartan  (AVAPRO ) 300 MG tablet Take 1 tablet (300 mg total) by mouth daily. 90 tablet 3   metoprolol  succinate (TOPROL -XL) 25 MG 24 hr tablet TAKE ONE-HALF TABLET BY MOUTH  DAILY 45 tablet 3   nitroGLYCERIN   (NITROSTAT ) 0.4 MG SL tablet Place 1 tablet (0.4 mg total) under the tongue every 5 (five) minutes as needed for chest pain. 25 tablet 3   omeprazole (PRILOSEC) 40 MG capsule Take 40 mg by mouth in the morning.     vitamin B-12 (CYANOCOBALAMIN) 500 MCG tablet Take 500 mcg by mouth in the morning.     No current facility-administered medications for this visit.    Allergies  Hydrocodone  Electrocardiogram: 08/28/22 SR rate 60 old anterolateral MI with lateral T wave inversions  SR rate 63 same anterolateral T wave inversions unchanged   Assessment and Plan  Cerebellar Stroke:   Plavix  d/c by neuro thought to be dissection of vertebral. Continue cholesterol meds , ASA and non smoking  CAD: Stable with no angina and good activity level.  Continue lopressor  and ASA   DCM  euvolemic functional class one  activity limited by cerebellar stroke Some edema better on lasix  Continue avapro  and toprol   EF 39% by MRI 03/02/21 no mural apical thrombus Update TTE    Chol: [employer] grant on nexlizet  and vascepa  for now LDL 42 04/30/22   HTN: Well controlled.  Continue current medications and low sodium Dash type diet.    Back Pain:    F/U neurosurgery and [name]   Prostate:  BPH post TURP July 2022  f/u urology   Smoking:  wheezing on exam Albuterol  inhaler called in lung cancer screening CT no lesions 10.31.25  PFTls 04/28/24 moderate COPD mild diffusion defect no bronchodilator response F/U Dr [name] pulmonary with rescue inhaler Smoking 1 ppd   TTE for ischemic DCM Lung cancer CT November 2026   F/U in 6 months   [name]

## 2024-11-18 NOTE — Telephone Encounter (Signed)
 Spoke with pt wife, rx information given.

## 2024-11-18 NOTE — Telephone Encounter (Signed)
 Patient's wife called stating she went to [facility] but they do not have the grant for the Nexlizet . Patient's wife is requesting for us  to give her the grant information therefore she can give it to [facility]. Please advise.

## 2024-11-22 ENCOUNTER — Other Ambulatory Visit: Payer: Self-pay | Admitting: Cardiovascular Disease

## 2024-11-27 ENCOUNTER — Encounter: Payer: Self-pay | Admitting: Cardiovascular Disease

## 2024-11-27 ENCOUNTER — Ambulatory Visit: Attending: Cardiovascular Disease | Admitting: Cardiovascular Disease

## 2024-11-27 VITALS — BP 144/84 | HR 63 | Ht 68.0 in | Wt 204.0 lb

## 2024-11-27 DIAGNOSIS — I10 Essential (primary) hypertension: Secondary | ICD-10-CM

## 2024-11-27 DIAGNOSIS — I2510 Atherosclerotic heart disease of native coronary artery without angina pectoris: Secondary | ICD-10-CM | POA: Diagnosis not present

## 2024-11-27 DIAGNOSIS — I420 Dilated cardiomyopathy: Secondary | ICD-10-CM | POA: Diagnosis not present

## 2024-11-27 DIAGNOSIS — F17200 Nicotine dependence, unspecified, uncomplicated: Secondary | ICD-10-CM

## 2024-11-27 NOTE — Patient Instructions (Signed)
 Medication Instructions:  Your physician recommends that you continue on your current medications as directed. Please refer to the Current Medication list given to you today.  *If you need a refill on your cardiac medications before your next appointment, please call your pharmacy*  Lab Work: NONE ordered at this time of appointment   Testing/Procedures: Your physician has requested that you have an echocardiogram. Echocardiography is a painless test that uses sound waves to create images of your heart. It provides your doctor with information about the size and shape of your heart and how well your hearts chambers and valves are working. This procedure takes approximately one hour. There are no restrictions for this procedure. Please do NOT wear cologne, perfume, aftershave, or lotions (deodorant is allowed). Please arrive 15 minutes prior to your appointment time.  Please note: We ask at that you not bring children with you during ultrasound (echo/ vascular) testing. Due to room size and safety concerns, children are not allowed in the ultrasound rooms during exams. Our front office staff cannot provide observation of children in our lobby area while testing is being conducted. An adult accompanying a patient to their appointment will only be allowed in the ultrasound room at the discretion of the ultrasound technician under special circumstances. We apologize for any inconvenience.   Follow-Up: At [facility], you and your health needs are our priority.  As part of our continuing mission to provide you with exceptional heart care, our providers are all part of one team.  This team includes your primary Cardiologist (physician) and Advanced Practice Providers or APPs (Physician Assistants and Nurse Practitioners) who all work together to provide you with the care you need, when you need it.  Your next appointment:   1 year(s)  Provider:   [name], MD    We recommend signing  up for the patient portal called MyChart.  Sign up information is provided on this After Visit Summary.  MyChart is used to connect with patients for Virtual Visits (Telemedicine).  Patients are able to view lab/test results, encounter notes, upcoming appointments, etc.  Non-urgent messages can be sent to your provider as well.   To learn more about what you can do with MyChart, go to [email or web address].   Other Instructions

## 2024-12-05 ENCOUNTER — Other Ambulatory Visit: Payer: Self-pay | Admitting: Cardiovascular Disease

## 2024-12-08 ENCOUNTER — Other Ambulatory Visit: Payer: Self-pay | Admitting: Cardiovascular Disease

## 2025-01-05 ENCOUNTER — Ambulatory Visit (HOSPITAL_COMMUNITY)
